# Patient Record
Sex: Female | Born: 1935 | Race: White | Hispanic: No | Marital: Married | Smoking: Former smoker
Health system: Southern US, Community
[De-identification: ages and names within clinical notes are randomized; demographics above are authoritative.]

## PROBLEM LIST (undated history)

## (undated) DIAGNOSIS — H9190 Unspecified hearing loss, unspecified ear: Secondary | ICD-10-CM

## (undated) DIAGNOSIS — F32A Depression, unspecified: Secondary | ICD-10-CM

## (undated) DIAGNOSIS — M549 Dorsalgia, unspecified: Secondary | ICD-10-CM

## (undated) DIAGNOSIS — R296 Repeated falls: Secondary | ICD-10-CM

## (undated) DIAGNOSIS — F329 Major depressive disorder, single episode, unspecified: Secondary | ICD-10-CM

## (undated) DIAGNOSIS — I639 Cerebral infarction, unspecified: Secondary | ICD-10-CM

## (undated) DIAGNOSIS — Z960 Presence of urogenital implants: Secondary | ICD-10-CM

## (undated) DIAGNOSIS — F419 Anxiety disorder, unspecified: Secondary | ICD-10-CM

## (undated) DIAGNOSIS — N2889 Other specified disorders of kidney and ureter: Secondary | ICD-10-CM

## (undated) DIAGNOSIS — C9110 Chronic lymphocytic leukemia of B-cell type not having achieved remission: Secondary | ICD-10-CM

## (undated) DIAGNOSIS — M4850XA Collapsed vertebra, not elsewhere classified, site unspecified, initial encounter for fracture: Secondary | ICD-10-CM

## (undated) DIAGNOSIS — Z8719 Personal history of other diseases of the digestive system: Secondary | ICD-10-CM

## (undated) DIAGNOSIS — E785 Hyperlipidemia, unspecified: Secondary | ICD-10-CM

## (undated) DIAGNOSIS — G8929 Other chronic pain: Secondary | ICD-10-CM

## (undated) DIAGNOSIS — K219 Gastro-esophageal reflux disease without esophagitis: Secondary | ICD-10-CM

## (undated) DIAGNOSIS — M810 Age-related osteoporosis without current pathological fracture: Secondary | ICD-10-CM

## (undated) DIAGNOSIS — G936 Cerebral edema: Secondary | ICD-10-CM

## (undated) DIAGNOSIS — I10 Essential (primary) hypertension: Secondary | ICD-10-CM

## (undated) DIAGNOSIS — F0390 Unspecified dementia without behavioral disturbance: Secondary | ICD-10-CM

## (undated) DIAGNOSIS — R413 Other amnesia: Secondary | ICD-10-CM

## (undated) DIAGNOSIS — N183 Chronic kidney disease, stage 3 unspecified: Secondary | ICD-10-CM

## (undated) DIAGNOSIS — S42309A Unspecified fracture of shaft of humerus, unspecified arm, initial encounter for closed fracture: Secondary | ICD-10-CM

## (undated) DIAGNOSIS — R410 Disorientation, unspecified: Secondary | ICD-10-CM

## (undated) DIAGNOSIS — I82409 Acute embolism and thrombosis of unspecified deep veins of unspecified lower extremity: Secondary | ICD-10-CM

## (undated) DIAGNOSIS — I619 Nontraumatic intracerebral hemorrhage, unspecified: Secondary | ICD-10-CM

## (undated) HISTORY — DX: Unspecified fracture of shaft of humerus, unspecified arm, initial encounter for closed fracture: S42.309A

## (undated) HISTORY — DX: Cerebral edema: G93.6

## (undated) HISTORY — DX: Depression, unspecified: F32.A

## (undated) HISTORY — PX: ABDOMINAL HYSTERECTOMY: SHX81

## (undated) HISTORY — DX: Presence of urogenital implants: Z96.0

## (undated) HISTORY — PX: FRACTURE SURGERY: SHX138

## (undated) HISTORY — DX: Cerebral infarction, unspecified: I63.9

## (undated) HISTORY — DX: Unspecified hearing loss, unspecified ear: H91.90

## (undated) HISTORY — DX: Disorientation, unspecified: R41.0

## (undated) HISTORY — DX: Repeated falls: R29.6

## (undated) HISTORY — DX: Major depressive disorder, single episode, unspecified: F32.9

## (undated) HISTORY — PX: CHOLECYSTECTOMY: SHX55

## (undated) HISTORY — DX: Other amnesia: R41.3

---

## 1978-09-08 HISTORY — PX: FOREARM FRACTURE SURGERY: SHX649

## 2000-02-29 ENCOUNTER — Other Ambulatory Visit: Admission: RE | Admit: 2000-02-29 | Discharge: 2000-02-29 | Payer: Self-pay | Admitting: Family Medicine

## 2004-01-08 HISTORY — PX: BLADDER SUSPENSION: SHX72

## 2004-11-20 ENCOUNTER — Inpatient Hospital Stay (HOSPITAL_COMMUNITY): Admission: RE | Admit: 2004-11-20 | Discharge: 2004-11-21 | Payer: Self-pay | Admitting: Obstetrics and Gynecology

## 2004-12-12 ENCOUNTER — Emergency Department (HOSPITAL_COMMUNITY): Admission: EM | Admit: 2004-12-12 | Discharge: 2004-12-12 | Payer: Self-pay | Admitting: Emergency Medicine

## 2005-04-16 ENCOUNTER — Other Ambulatory Visit: Admission: RE | Admit: 2005-04-16 | Discharge: 2005-04-16 | Payer: Self-pay | Admitting: Obstetrics and Gynecology

## 2005-10-14 ENCOUNTER — Encounter: Admission: RE | Admit: 2005-10-14 | Discharge: 2005-10-14 | Payer: Self-pay | Admitting: Family Medicine

## 2006-08-29 ENCOUNTER — Encounter: Admission: RE | Admit: 2006-08-29 | Discharge: 2006-08-29 | Payer: Self-pay | Admitting: Family Medicine

## 2006-09-22 ENCOUNTER — Encounter: Admission: RE | Admit: 2006-09-22 | Discharge: 2006-09-22 | Payer: Self-pay | Admitting: Family Medicine

## 2006-10-09 ENCOUNTER — Encounter: Admission: RE | Admit: 2006-10-09 | Discharge: 2006-10-09 | Payer: Self-pay | Admitting: Interventional Cardiology

## 2007-03-07 ENCOUNTER — Encounter: Admission: RE | Admit: 2007-03-07 | Discharge: 2007-03-07 | Payer: Self-pay | Admitting: Family Medicine

## 2009-01-07 DIAGNOSIS — I639 Cerebral infarction, unspecified: Secondary | ICD-10-CM

## 2009-01-07 DIAGNOSIS — I619 Nontraumatic intracerebral hemorrhage, unspecified: Secondary | ICD-10-CM

## 2009-01-07 HISTORY — DX: Cerebral infarction, unspecified: I63.9

## 2009-01-07 HISTORY — DX: Nontraumatic intracerebral hemorrhage, unspecified: I61.9

## 2009-04-24 ENCOUNTER — Encounter: Admission: RE | Admit: 2009-04-24 | Discharge: 2009-04-24 | Payer: Self-pay | Admitting: Family Medicine

## 2009-08-08 ENCOUNTER — Emergency Department (HOSPITAL_BASED_OUTPATIENT_CLINIC_OR_DEPARTMENT_OTHER): Admission: EM | Admit: 2009-08-08 | Discharge: 2009-08-08 | Payer: Self-pay | Admitting: Emergency Medicine

## 2009-08-08 ENCOUNTER — Ambulatory Visit: Payer: Self-pay | Admitting: Diagnostic Radiology

## 2009-08-11 ENCOUNTER — Emergency Department (HOSPITAL_BASED_OUTPATIENT_CLINIC_OR_DEPARTMENT_OTHER): Admission: EM | Admit: 2009-08-11 | Discharge: 2009-08-11 | Payer: Self-pay | Admitting: Emergency Medicine

## 2009-08-19 ENCOUNTER — Emergency Department (HOSPITAL_BASED_OUTPATIENT_CLINIC_OR_DEPARTMENT_OTHER): Admission: EM | Admit: 2009-08-19 | Discharge: 2009-08-20 | Payer: Self-pay | Admitting: Emergency Medicine

## 2010-03-29 ENCOUNTER — Emergency Department (HOSPITAL_BASED_OUTPATIENT_CLINIC_OR_DEPARTMENT_OTHER)
Admission: EM | Admit: 2010-03-29 | Discharge: 2010-03-29 | Disposition: A | Discharge status: Home / Self Care | Payer: Medicare Other | Attending: Emergency Medicine | Admitting: Emergency Medicine

## 2010-03-29 DIAGNOSIS — K219 Gastro-esophageal reflux disease without esophagitis: Secondary | ICD-10-CM | POA: Insufficient documentation

## 2010-03-29 DIAGNOSIS — Z8679 Personal history of other diseases of the circulatory system: Secondary | ICD-10-CM | POA: Insufficient documentation

## 2010-03-29 DIAGNOSIS — Z86718 Personal history of other venous thrombosis and embolism: Secondary | ICD-10-CM | POA: Insufficient documentation

## 2010-03-29 DIAGNOSIS — E7800 Pure hypercholesterolemia, unspecified: Secondary | ICD-10-CM | POA: Insufficient documentation

## 2010-03-29 DIAGNOSIS — K5289 Other specified noninfective gastroenteritis and colitis: Secondary | ICD-10-CM | POA: Insufficient documentation

## 2010-03-29 DIAGNOSIS — R197 Diarrhea, unspecified: Secondary | ICD-10-CM | POA: Insufficient documentation

## 2010-03-29 DIAGNOSIS — I10 Essential (primary) hypertension: Secondary | ICD-10-CM | POA: Insufficient documentation

## 2010-03-29 LAB — BASIC METABOLIC PANEL
CO2: 24 mEq/L (ref 19–32)
Calcium: 9.3 mg/dL (ref 8.4–10.5)
Creatinine, Ser: 0.8 mg/dL (ref 0.4–1.2)
GFR calc Af Amer: >60 mL/min (ref >60)
Glucose, Bld: 126 mg/dL — ABNORMAL HIGH (ref 70–99)

## 2010-03-29 LAB — CBC
HCT: 38.6 % (ref 36.0–46.0)
Hemoglobin: 13.7 g/dL (ref 12.0–15.0)
MCH: 33.1 pg (ref 26.0–34.0)
MCHC: 35.5 g/dL (ref 30.0–36.0)

## 2010-03-29 LAB — URINALYSIS, ROUTINE W REFLEX MICROSCOPIC
Bilirubin Urine: NEGATIVE
Hgb urine dipstick: NEGATIVE
Protein, ur: NEGATIVE mg/dL
Urobilinogen, UA: 0.2 mg/dL (ref 0.0–1.0)

## 2010-03-29 LAB — DIFFERENTIAL
Basophils Relative: 0 % (ref 0–1)
Eosinophils Relative: 0 % (ref 0–5)
Monocytes Absolute: 0.4 10*3/uL (ref 0.1–1.0)
Monocytes Relative: 3 % (ref 3–12)
Neutro Abs: 10.7 10*3/uL — ABNORMAL HIGH (ref 1.7–7.7)

## 2010-05-25 NOTE — H&P (Signed)
[name], [name]              ACCOUNT NO.:  [id number]   MEDICAL RECORD NO.:  [id number]          PATIENT TYPE:  INP   LOCATION:  9316                          FACILITY:  [facility]   PHYSICIAN:  Charles A. Delcambre, MDDATE OF BIRTH:  1933/08/23   DATE OF ADMISSION:  11/20/2004  DATE OF DISCHARGE:                                HISTORY & PHYSICAL   CHIEF COMPLAINT:  Vaginal bulge and pressure and pain and protrusion from  the vaginal introitus having to push something back in to urinate completely  and pelvic discomfort with diagnosis of a cystocele in the office.   HISTORY OF PRESENT ILLNESS:  A 77 year old para 3-0-0-3 patient of Dr.  Bjorn Pippin at [facility] with known cystocele and small rectocele now to be  admitted for repair of a cystocele with symptoms as noted above.   PAST MEDICAL HISTORY:  GERD.   PAST SURGICAL HISTORY:  1.  Vaginal hysterectomy.  Ovaries remain in situ.  2.  D&C prior to that.   MEDICATIONS:  1.  Prilosec one tablet once per day.  2.  Meclizine 25 mg p.o. t.i.d.   ALLERGIES:  PENICILLIN, reaction not specified.   SOCIAL HISTORY:  Denies tobacco, ethanol or drug use or STD exposure in the  past.  She is married, monogamous relationship with her husband.  Not  sexually active.  She quit smoking, but has a 20-pack-year smoking in the  past.   FAMILY HISTORY:  Father is deceased, not specified, of renal failure.  Mother deceased CVA, age not specified.  Four siblings.  Brother leg  amputated, not specified.  Brother single.  Otherwise, another brother is in  poor health.  One sister in fair health, not specified further.  Otherwise,  no major illnesses.   REVIEW OF SYSTEMS:  She has a cystocele with grayish thick discharge and  problems with back pain and discharge makes her vaginal bulge stick to her  underwear.  She denies fevers, chills, rashes, lesions, headaches,  dizziness.  Some seasonal allergies are present occasionally, not present.  Currently, no chest pain, shortness of breath, wheezing, diarrhea,  constipation, bleeding, melena, or hematochezia, urgency, frequency,  dysuria, incontinence, hematuria, galactorrhea, or emotional changes.   PHYSICAL EXAMINATION:  GENERAL:  Alert and oriented x3.  No distress.  VITAL SIGNS:  As noted in the admission chart.  HEENT:  Grossly within normal limits.  NECK:  Supple without thyromegaly or adenopathy.  LUNGS:  Clear bilaterally.  HEART:  Regular rate and rhythm without murmurs, rubs, or gallops.  BREASTS:  Symmetrical, deferred.  ABDOMEN:  Soft, flat, and nontender.  No hepatosplenomegaly.  No other  masses noted.  PELVIC:  Normal external female genetalia intact.  Bartholin's, urethral,  Skene's within normal limits.  Vault without discharge currently, but a  large protruding mass consistent with prolapsing cystocele as noted.  Very  minimal rectocele is noted.  Anal sphincter tone is good.  No evidence of  enterocele on examination is noted.  EXTREMITIES:  No significant edema.  The extremities are nontender.   ASSESSMENT:  Cystocele, small rectocele.   PLAN:  Admission for repair today.  The patient gives informed consent,  accepts risks of infection, bleeding, bowel and bladder damage, ureteral  damage, blood product risks including hepatitis and HIV exposure.  All  questions are answered.  She understands risks of urinary retention  postoperatively.  Will proceed with surgery as noted.  If rectocele is not  significant during surgery further, I will not proceed with rectocele repair  to not over proceed with reducing the vagina length or capacity in case she  wants to become sexually active once again.  She has been informed of such  and will proceed as outlined.  Laboratories are obtained.  Hemoglobin 13.6,  hematocrit 39.5, and BMP electrolytes 137 sodium, potassium 3.9, chloride  101, CO2 28, glucose 95, BUN 8, creatinine 0.8, calcium 9.1.      Charles A.  Sydnee Cabal, MD  Electronically Signed     CAD/MEDQ  D:  11/20/2004  T:  11/20/2004  Job:  [id number]

## 2010-05-25 NOTE — Op Note (Signed)
[name], [name]              ACCOUNT NO.:  [id number]   MEDICAL RECORD NO.:  [id number]          PATIENT TYPE:  INP   LOCATION:  9316                          [facility]ACILITY:  [facility]   PHYSICIAN:  Charles A. Delcambre, MDDATE OF BIRTH:  [birth year]   DATE OF PROCEDURE:  11/20/2004  DATE OF DISCHARGE:                                 OPERATIVE REPORT   PREOPERATIVE DIAGNOSES:  1.  Cystocele.  2.  Rectocele.   POSTOPERATIVE DIAGNOSES:  1.  Cystocele.  2.  Very small rectocele, asymptomatic, at my recommendation, requiring no      repair at the time to limit further decrease of the vagina after large      anterior repair.   OPERATION/PROCEDURE:  Anterior repair.   SURGEON:  Charles A. Sydnee Cabal, M.D.   ASSISTANT:  Artist Pais, M.D.   COMPLICATIONS:  None.   ESTIMATED BLOOD LOSS:  Less than 50 mL.   ANESTHESIA:  General by endotracheal route.   SPECIMENS:  None.   COUNTS:  Sponge and needle counts correct x2.   OPERATIVE FINDINGS:  Large cystocele, very minimal rectocele as noted above.   DESCRIPTION OF PROCEDURE:  The patient was taken to the operating room and  placed in the supine position.  General anesthesia was induced without  difficulty.  Sterile prep and drape was undertaken and dorsal lithotomy  position.  Vaginal cuff was grasped with Allis clamps and 1% lidocaine with  1:200,000 epinephrine was injected under the vaginal mucosa, a total of 9 mL  to help in development of the plane of dissection.  Vaginal mucosa was  developed undermining off the bladder and opened, swinging the vaginal  mucosa off the cystocele laterally.  Infundibulopelvic fascia was then  plicated with 2-0 Vicryl with six sutures across, plicating this well,  giving good support of the cystocele, reducing it well.  This yielded  adequate support of the cystocele.  Excessive material of the vaginal mucosa  was excised.  The vaginal mucosa was then closed with 2-0 Vicryl running  locking suture  with good hemostasis.  Vaginal pack with one-inch gauze with  Estrace cream was placed to be left overnight.  Foley catheter  was placed.  The patient tolerated the procedure well and was taken to  recovery after further examination yielded physical findings for rectocele  as noted above.  She was extubated and taken to recovery with the physician  in attendance having tolerated the procedure well.      Charles A. Sydnee Cabal, MD  Electronically Signed     CAD/MEDQ  D:  11/20/2004  T:  11/20/2004  Job:  [id number]

## 2011-07-31 ENCOUNTER — Emergency Department (HOSPITAL_BASED_OUTPATIENT_CLINIC_OR_DEPARTMENT_OTHER)
Admission: EM | Admit: 2011-07-31 | Discharge: 2011-07-31 | Disposition: A | Discharge status: Home / Self Care | Payer: Medicare Other | Attending: Emergency Medicine | Admitting: Emergency Medicine

## 2011-07-31 ENCOUNTER — Encounter (HOSPITAL_BASED_OUTPATIENT_CLINIC_OR_DEPARTMENT_OTHER): Payer: Self-pay | Admitting: *Deleted

## 2011-07-31 DIAGNOSIS — B029 Zoster without complications: Secondary | ICD-10-CM | POA: Insufficient documentation

## 2011-07-31 DIAGNOSIS — Z87891 Personal history of nicotine dependence: Secondary | ICD-10-CM | POA: Insufficient documentation

## 2011-07-31 DIAGNOSIS — Z7982 Long term (current) use of aspirin: Secondary | ICD-10-CM | POA: Insufficient documentation

## 2011-07-31 DIAGNOSIS — Z9089 Acquired absence of other organs: Secondary | ICD-10-CM | POA: Insufficient documentation

## 2011-07-31 DIAGNOSIS — E785 Hyperlipidemia, unspecified: Secondary | ICD-10-CM | POA: Insufficient documentation

## 2011-07-31 DIAGNOSIS — Z91040 Latex allergy status: Secondary | ICD-10-CM | POA: Insufficient documentation

## 2011-07-31 DIAGNOSIS — Z90710 Acquired absence of both cervix and uterus: Secondary | ICD-10-CM | POA: Insufficient documentation

## 2011-07-31 DIAGNOSIS — I10 Essential (primary) hypertension: Secondary | ICD-10-CM | POA: Insufficient documentation

## 2011-07-31 DIAGNOSIS — Z880 Allergy status to penicillin: Secondary | ICD-10-CM | POA: Insufficient documentation

## 2011-07-31 DIAGNOSIS — Z8673 Personal history of transient ischemic attack (TIA), and cerebral infarction without residual deficits: Secondary | ICD-10-CM | POA: Insufficient documentation

## 2011-07-31 HISTORY — DX: Other specified disorders of kidney and ureter: N28.89

## 2011-07-31 HISTORY — DX: Essential (primary) hypertension: I10

## 2011-07-31 HISTORY — DX: Unspecified dementia, unspecified severity, without behavioral disturbance, psychotic disturbance, mood disturbance, and anxiety: F03.90

## 2011-07-31 HISTORY — DX: Nontraumatic intracerebral hemorrhage, unspecified: I61.9

## 2011-07-31 HISTORY — DX: Hyperlipidemia, unspecified: E78.5

## 2011-07-31 LAB — URINALYSIS, ROUTINE W REFLEX MICROSCOPIC
Bilirubin Urine: NEGATIVE
Glucose, UA: NEGATIVE mg/dL
Hgb urine dipstick: NEGATIVE
Ketones, ur: NEGATIVE mg/dL
Protein, ur: NEGATIVE mg/dL

## 2011-07-31 MED ORDER — VALACYCLOVIR HCL 1 G PO TABS: 1000.0000 mg | ORAL_TABLET | Freq: Three times a day (TID) | ORAL | Status: AC

## 2011-07-31 MED ORDER — GABAPENTIN 100 MG PO CAPS: 100.0000 mg | ORAL_CAPSULE | Freq: Three times a day (TID) | ORAL | Status: DC

## 2011-07-31 MED ORDER — LIDOCAINE 5 % EX OINT: TOPICAL_OINTMENT | CUTANEOUS | Status: AC | PRN

## 2011-07-31 NOTE — ED Notes (Signed)
Pt c/o lower abd pain and central chest pain which radiates to back x 2 days off and on

## 2011-07-31 NOTE — ED Provider Notes (Signed)
I saw and evaluated the patient, reviewed the resident's note and I agree with the findings and plan.   .Face to face Exam:  General:  Awake HEENT:  Atraumatic Resp:  Normal effort Abd:  Nondistended Neuro:No focal weakness Lymph: No adenopathy   [name], MD 07/31/11 2014

## 2011-07-31 NOTE — ED Provider Notes (Addendum)
History     CSN: [id number]  Arrival date & time 07/31/11  1820   First MD Initiated Contact with Patient 07/31/11 1840      Chief Complaint  Patient presents with  . Abdominal Pain  . Chest Pain    (Consider location/radiation/quality/duration/timing/severity/associated sxs/prior treatment) HPI  78 -year-old female who presents with abdominal and back pain of 2 days duration. She says the pain changes location but is primarily on her left flank and left lower back. Is accompanied by nausea but no vomiting. She denies diarrhea and constipation. She denies chest pain shortness of breath. She history of coronary heart disease or MI. The pain is exacerbated by lying down on her back. It is alleviated by nothing.  Past Medical History  Diagnosis Date  . Hypertension   . Hyperlipemia   . Dementia   . Cerebral hemorrhage, nontraumatic 2011  . CVA (cerebral infarction) 2011    left cerebellum  . Ureterocele, acquired     Past Surgical History  Procedure Date  . Abdominal hysterectomy   . Cholecystectomy     History reviewed. No pertinent family history.  History  Substance Use Topics  . Smoking status: Former Games developer  . Smokeless tobacco: Not on file  . Alcohol Use: No    OB History    Grav Para Term Preterm Abortions TAB SAB Ect Mult Living                  Review of Systems Positive for rash on her back. All the systems negative   Allergies  Penicillins and Latex  Home Medications   Current Outpatient Rx  Name Route Sig Dispense Refill  . AMLODIPINE BESYLATE 5 MG PO TABS Oral Take 5 mg by mouth every morning.    . ASPIRIN EC 81 MG PO TBEC Oral Take 81 mg by mouth every morning.    . ATORVASTATIN CALCIUM 80 MG PO TABS Oral Take 80 mg by mouth every morning.    Marland Kitchen VITAMIN D 1000 UNITS PO TABS Oral Take 1,000 Units by mouth 2 (two) times daily.    . IBUPROFEN 200 MG PO TABS Oral Take 200 mg by mouth every 6 (six) hours as needed. For pain    . LOSARTAN  POTASSIUM 100 MG PO TABS Oral Take 100 mg by mouth every morning.    Marland Kitchen BIOFREEZE EX Apply externally Apply 1 application topically 2 (two) times daily as needed. For arthritis pain in neck    . ADULT MULTIVITAMIN W/MINERALS CH Oral Take 1 tablet by mouth daily.    Marland Kitchen OMEPRAZOLE 40 MG PO CPDR Oral Take 40 mg by mouth every morning.    . TRAZODONE HCL 50 MG PO TABS Oral Take 50 mg by mouth at bedtime.    Marland Kitchen VITAMIN B-12 1000 MCG PO TABS Oral Take 1,000 mcg by mouth daily.      BP 156/69  Pulse 68  Temp 98 F (36.7 C) (Oral)  Resp 16  Ht 5\' 4"  (1.626 m)  Wt 134 lb (60.782 kg)  BMI 23.00 kg/m2  SpO2 100%  Physical Exam  Constitutional: She is oriented to person, place, and time. She appears well-developed and well-nourished. No distress.  HENT:  Head: Normocephalic and atraumatic.  Mouth/Throat: No oropharyngeal exudate.  Eyes: Conjunctivae and EOM are normal. Pupils are equal, round, and reactive to light.  Neck: Normal range of motion.  Cardiovascular: Normal rate and regular rhythm.   Pulmonary/Chest: Effort normal and breath sounds normal.  Abdominal: Soft. Bowel sounds are normal.  Neurological: She is alert and oriented to person, place, and time.  Skin: Rash noted. Rash is vesicular. She is not diaphoretic.       ED Course  Procedures (including critical care time)   Date: 07/31/2011  Rate: 70  Rhythm: normal sinus rhythm  QRS Axis: normal  Intervals: normal  ST/T Wave abnormalities: normal  Conduction Disutrbances:none  Narrative Interpretation: normal ECG  Old EKG Reviewed: none available     Labs Reviewed  URINALYSIS, ROUTINE W REFLEX MICROSCOPIC   No results found.   1. Shingles       MDM  46 from female with left flank and back pain that is result of shingles outbreak. Fortunately she was within 72 hours of the presentation of the rash. Therefore she was treated with Valacyclovir 1 g 3 times a day. For pain she was prescribed gabapentin and  lidocaine gel. She was stable and appropriate for discharge. She was provided education about shingles with this plan, and she will need follow up with her PCP in one week. She is in agreement        Garnetta Buddy, MD 07/31/11 2007  Garnetta Buddy, MD 07/31/11 2217

## 2011-08-01 NOTE — ED Provider Notes (Signed)
I saw and evaluated the patient, reviewed the resident's note and I agree with the findings and plan.   .Face to face Exam:  General:  Awake HEENT:  Atraumatic Resp:  Normal effort Abd:  Nondistended Neuro:No focal weakness Lymph: No adenopathy   [name], MD 08/01/11 1113

## 2013-09-02 ENCOUNTER — Encounter: Payer: Self-pay | Admitting: Interventional Cardiology

## 2013-09-02 DIAGNOSIS — E785 Hyperlipidemia, unspecified: Secondary | ICD-10-CM | POA: Insufficient documentation

## 2013-09-02 DIAGNOSIS — Z8673 Personal history of transient ischemic attack (TIA), and cerebral infarction without residual deficits: Secondary | ICD-10-CM | POA: Insufficient documentation

## 2013-09-02 DIAGNOSIS — N2889 Other specified disorders of kidney and ureter: Secondary | ICD-10-CM | POA: Insufficient documentation

## 2013-09-02 DIAGNOSIS — F0390 Unspecified dementia without behavioral disturbance: Secondary | ICD-10-CM | POA: Insufficient documentation

## 2013-09-02 DIAGNOSIS — I619 Nontraumatic intracerebral hemorrhage, unspecified: Secondary | ICD-10-CM | POA: Insufficient documentation

## 2014-01-07 DIAGNOSIS — G936 Cerebral edema: Secondary | ICD-10-CM

## 2014-01-07 HISTORY — DX: Cerebral edema: G93.6

## 2014-01-16 ENCOUNTER — Emergency Department (HOSPITAL_BASED_OUTPATIENT_CLINIC_OR_DEPARTMENT_OTHER): Payer: Medicare Other

## 2014-01-16 ENCOUNTER — Encounter (HOSPITAL_BASED_OUTPATIENT_CLINIC_OR_DEPARTMENT_OTHER): Payer: Self-pay | Admitting: *Deleted

## 2014-01-16 ENCOUNTER — Emergency Department (HOSPITAL_BASED_OUTPATIENT_CLINIC_OR_DEPARTMENT_OTHER)
Admission: EM | Admit: 2014-01-16 | Discharge: 2014-01-16 | Disposition: A | Discharge status: Home / Self Care | Payer: Medicare Other | Attending: Emergency Medicine | Admitting: Emergency Medicine

## 2014-01-16 DIAGNOSIS — Z7982 Long term (current) use of aspirin: Secondary | ICD-10-CM | POA: Insufficient documentation

## 2014-01-16 DIAGNOSIS — F0390 Unspecified dementia without behavioral disturbance: Secondary | ICD-10-CM | POA: Insufficient documentation

## 2014-01-16 DIAGNOSIS — Y9389 Activity, other specified: Secondary | ICD-10-CM | POA: Insufficient documentation

## 2014-01-16 DIAGNOSIS — Z8673 Personal history of transient ischemic attack (TIA), and cerebral infarction without residual deficits: Secondary | ICD-10-CM | POA: Diagnosis not present

## 2014-01-16 DIAGNOSIS — I10 Essential (primary) hypertension: Secondary | ICD-10-CM | POA: Diagnosis not present

## 2014-01-16 DIAGNOSIS — S22060A Wedge compression fracture of T7-T8 vertebra, initial encounter for closed fracture: Secondary | ICD-10-CM | POA: Diagnosis not present

## 2014-01-16 DIAGNOSIS — Z880 Allergy status to penicillin: Secondary | ICD-10-CM | POA: Diagnosis not present

## 2014-01-16 DIAGNOSIS — Y9289 Other specified places as the place of occurrence of the external cause: Secondary | ICD-10-CM | POA: Insufficient documentation

## 2014-01-16 DIAGNOSIS — Z87891 Personal history of nicotine dependence: Secondary | ICD-10-CM | POA: Diagnosis not present

## 2014-01-16 DIAGNOSIS — Z91040 Latex allergy status: Secondary | ICD-10-CM | POA: Insufficient documentation

## 2014-01-16 DIAGNOSIS — S3992XA Unspecified injury of lower back, initial encounter: Secondary | ICD-10-CM | POA: Diagnosis present

## 2014-01-16 DIAGNOSIS — R51 Headache: Secondary | ICD-10-CM | POA: Diagnosis not present

## 2014-01-16 DIAGNOSIS — Y998 Other external cause status: Secondary | ICD-10-CM | POA: Diagnosis not present

## 2014-01-16 DIAGNOSIS — X58XXXA Exposure to other specified factors, initial encounter: Secondary | ICD-10-CM | POA: Diagnosis not present

## 2014-01-16 DIAGNOSIS — Z79899 Other long term (current) drug therapy: Secondary | ICD-10-CM | POA: Diagnosis not present

## 2014-01-16 DIAGNOSIS — S22000A Wedge compression fracture of unspecified thoracic vertebra, initial encounter for closed fracture: Secondary | ICD-10-CM

## 2014-01-16 LAB — CBC WITH DIFFERENTIAL/PLATELET
BASOS ABS: 0 10*3/uL (ref 0.0–0.1)
Basophils Relative: 0 % (ref 0–1)
Eosinophils Absolute: 0 10*3/uL (ref 0.0–0.7)
Eosinophils Relative: 1 % (ref 0–5)
HCT: 34.7 % — ABNORMAL LOW (ref 36.0–46.0)
Hemoglobin: 12.1 g/dL (ref 12.0–15.0)
LYMPHS ABS: 1 10*3/uL (ref 0.7–4.0)
LYMPHS PCT: 15 % (ref 12–46)
MCH: 32.9 pg (ref 26.0–34.0)
MCHC: 34.9 g/dL (ref 30.0–36.0)
MCV: 94.3 fL (ref 78.0–100.0)
MONOS PCT: 10 % (ref 3–12)
Monocytes Absolute: 0.6 10*3/uL (ref 0.1–1.0)
NEUTROS ABS: 4.7 10*3/uL (ref 1.7–7.7)
NEUTROS PCT: 74 % (ref 43–77)
Platelets: 281 10*3/uL (ref 150–400)
RBC: 3.68 MIL/uL — ABNORMAL LOW (ref 3.87–5.11)
RDW: 11.4 % — ABNORMAL LOW (ref 11.5–15.5)
WBC: 6.3 10*3/uL (ref 4.0–10.5)

## 2014-01-16 LAB — COMPREHENSIVE METABOLIC PANEL
ALBUMIN: 4.2 g/dL (ref 3.5–5.2)
ALK PHOS: 95 U/L (ref 39–117)
ALT: 21 U/L (ref 0–35)
ANION GAP: 8 (ref 5–15)
AST: 24 U/L (ref 0–37)
BILIRUBIN TOTAL: 0.7 mg/dL (ref 0.3–1.2)
BUN: 13 mg/dL (ref 6–23)
CO2: 26 mmol/L (ref 19–32)
Calcium: 9.6 mg/dL (ref 8.4–10.5)
Chloride: 96 mEq/L (ref 96–112)
Creatinine, Ser: 1.05 mg/dL (ref 0.50–1.10)
GFR, EST AFRICAN AMERICAN: 57 mL/min — AB (ref >90)
GFR, EST NON AFRICAN AMERICAN: 50 mL/min — AB (ref >90)
Glucose, Bld: 108 mg/dL — ABNORMAL HIGH (ref 70–99)
Potassium: 4.2 mmol/L (ref 3.5–5.1)
Sodium: 130 mmol/L — ABNORMAL LOW (ref 135–145)
Total Protein: 7 g/dL (ref 6.0–8.3)

## 2014-01-16 LAB — URINALYSIS, ROUTINE W REFLEX MICROSCOPIC
BILIRUBIN URINE: NEGATIVE
GLUCOSE, UA: NEGATIVE mg/dL
Hgb urine dipstick: NEGATIVE
Ketones, ur: NEGATIVE mg/dL
Leukocytes, UA: NEGATIVE
NITRITE: NEGATIVE
PH: 7.5 (ref 5.0–8.0)
PROTEIN: NEGATIVE mg/dL
Specific Gravity, Urine: 1.013 (ref 1.005–1.030)
UROBILINOGEN UA: 0.2 mg/dL (ref 0.0–1.0)

## 2014-01-16 LAB — PROTIME-INR
INR: 0.98 (ref 0.00–1.49)
Prothrombin Time: 13 seconds (ref 11.6–15.2)

## 2014-01-16 LAB — LIPASE, BLOOD: Lipase: 61 U/L — ABNORMAL HIGH (ref 11–59)

## 2014-01-16 MED ORDER — IOHEXOL 350 MG/ML SOLN
100.0000 mL | Freq: Once | INTRAVENOUS | Status: AC | PRN
Start: 2014-01-16 — End: 2014-01-16
  Administered 2014-01-16: 100 mL via INTRAVENOUS

## 2014-01-16 MED ORDER — MORPHINE SULFATE 2 MG/ML IJ SOLN
2.0000 mg | Freq: Once | INTRAMUSCULAR | Status: AC
  Administered 2014-01-16: 2 mg via INTRAVENOUS
  Filled 2014-01-16: qty 1

## 2014-01-16 MED ORDER — OXYCODONE-ACETAMINOPHEN 5-325 MG PO TABS
2.0000 | ORAL_TABLET | Freq: Once | ORAL | Status: AC
  Administered 2014-01-16: 2 via ORAL
  Filled 2014-01-16: qty 2

## 2014-01-16 MED ORDER — OXYCODONE-ACETAMINOPHEN 5-325 MG PO TABS: 1.0000 | ORAL_TABLET | ORAL | Status: DC | PRN

## 2014-01-16 MED ORDER — LORAZEPAM 2 MG/ML IJ SOLN
0.5000 mg | Freq: Once | INTRAMUSCULAR | Status: AC
  Administered 2014-01-16: 0.5 mg via INTRAVENOUS
  Filled 2014-01-16: qty 1

## 2014-01-16 MED ORDER — MORPHINE SULFATE 2 MG/ML IJ SOLN
2.0000 mg | Freq: Once | INTRAMUSCULAR | Status: AC
  Administered 2014-01-16: 2 mg via INTRAVENOUS

## 2014-01-16 MED ORDER — SODIUM CHLORIDE 0.9 % IV BOLUS (SEPSIS)
500.0000 mL | Freq: Once | INTRAVENOUS | Status: AC
  Administered 2014-01-16: 500 mL via INTRAVENOUS

## 2014-01-16 MED ORDER — MORPHINE SULFATE 2 MG/ML IJ SOLN
INTRAMUSCULAR | Status: AC
  Filled 2014-01-16: qty 1

## 2014-01-16 MED ORDER — ONDANSETRON HCL 4 MG/2ML IJ SOLN
4.0000 mg | Freq: Once | INTRAMUSCULAR | Status: AC
  Administered 2014-01-16: 4 mg via INTRAVENOUS
  Filled 2014-01-16: qty 2

## 2014-01-16 NOTE — ED Provider Notes (Signed)
CSN: [id number]     Arrival date & time 01/16/14  0809 History   First MD Initiated Contact with Patient 01/16/14 0827     Chief Complaint  Patient presents with  . Back Pain     (Consider location/radiation/quality/duration/timing/severity/associated sxs/prior Treatment) HPI 81 year old female with history of CVA, hypertension, hyperlipidemia who presents today complaining of neck pain for 2 months. She states that her low back began hurting in November when she been doing yard work. She had an acute change on New Year's Day when she began having much more severe pain throughout her entire back that occurred with any movement. She has not had any loss of strength but has had decreased ability to ambulate secondary to pain with any movement. She has had some episodic nausea and vomiting but in general has been eating and taking without difficulty. She has had some decreased stooling. Daughter has intact given her laxatives twice during the past week she has had relatively small stool output from this. She describes some frequency of urination but no urinary retention. She has not noted any lateralized weakness, extremity weakness, problems with urinary retention, or numbness during these episodes. They she states that her back pain is 10 out of 10. She describes it as occurring from her upper back all the way down to the lower back area. She has had no previous surgeries, no spinal manipulation, no sections in this area. She states that she has fallen several times in the past several months but had not noted that these caused back pain. None of these falls have been in the past week. She is here today with her daughter. She lives with her husband. Past Medical History  Diagnosis Date  . Hypertension   . Hyperlipemia   . Dementia   . Cerebral hemorrhage, nontraumatic 2011  . CVA (cerebral infarction) 2011    left cerebellum  . Ureterocele, acquired    Past Surgical History  Procedure  Laterality Date  . Abdominal hysterectomy    . Cholecystectomy     No family history on file. History  Substance Use Topics  . Smoking status: Former Research scientist (life sciences)  . Smokeless tobacco: Not on file  . Alcohol Use: No   OB History    No data available     Review of Systems  All other systems reviewed and are negative.     Allergies  Penicillins and Latex  Home Medications   Prior to Admission medications   Medication Sig Start Date End Date Taking? Authorizing Provider  amLODipine (NORVASC) 5 MG tablet Take 5 mg by mouth every morning.    Historical Provider, MD  aspirin EC 81 MG tablet Take 81 mg by mouth every morning.    Historical Provider, MD  atorvastatin (LIPITOR) 80 MG tablet Take 80 mg by mouth every morning.    Historical Provider, MD  cholecalciferol (VITAMIN D) 1000 UNITS tablet Take 1,000 Units by mouth 2 (two) times daily.    Historical Provider, MD  gabapentin (NEURONTIN) 100 MG capsule Take 1 capsule (100 mg total) by mouth 3 (three) times daily. 07/31/11 07/30/12  Angelica Ran, MD  ibuprofen (ADVIL,MOTRIN) 200 MG tablet Take 200 mg by mouth every 6 (six) hours as needed. For pain    Historical Provider, MD  losartan (COZAAR) 100 MG tablet Take 100 mg by mouth every morning.    Historical Provider, MD  Menthol, Topical Analgesic, (BIOFREEZE EX) Apply 1 application topically 2 (two) times daily as needed. For arthritis pain  in neck    Historical Provider, MD  Multiple Vitamin (MULTIVITAMIN WITH MINERALS) TABS Take 1 tablet by mouth daily.    Historical Provider, MD  omeprazole (PRILOSEC) 40 MG capsule Take 40 mg by mouth every morning.    Historical Provider, MD  traZODone (DESYREL) 50 MG tablet Take 50 mg by mouth at bedtime.    Historical Provider, MD  vitamin B-12 (CYANOCOBALAMIN) 1000 MCG tablet Take 1,000 mcg by mouth daily.    Historical Provider, MD   BP 129/67 mmHg  Pulse 89  Temp(Src) 98.3 F (36.8 C) (Oral)  Resp 20  Ht 5\' 4"  (1.626 m)  Wt 135 lb  (61.236 kg)  BMI 23.16 kg/m2  SpO2 100% Physical Exam  Constitutional: She is oriented to person, place, and time. She appears well-developed and well-nourished.  HENT:  Head: Normocephalic and atraumatic.  Right Ear: Tympanic membrane and external ear normal.  Left Ear: Tympanic membrane and external ear normal.  Nose: Nose normal. Right sinus exhibits no maxillary sinus tenderness and no frontal sinus tenderness. Left sinus exhibits no maxillary sinus tenderness and no frontal sinus tenderness.  Eyes: Conjunctivae and EOM are normal. Pupils are equal, round, and reactive to light. Right eye exhibits no nystagmus. Left eye exhibits no nystagmus.  Neck: Normal range of motion. Neck supple.  Cardiovascular: Normal rate, regular rhythm, normal heart sounds and intact distal pulses.   Pulmonary/Chest: Effort normal and breath sounds normal. No respiratory distress. She exhibits no tenderness.  Abdominal: Soft. Bowel sounds are normal. She exhibits no distension and no mass. There is tenderness.    Musculoskeletal: Normal range of motion. She exhibits no edema or tenderness.  Neurological: She is alert and oriented to person, place, and time. She has normal strength and normal reflexes. No sensory deficit. She displays a negative Romberg sign. Coordination normal. GCS eye subscore is 4. GCS verbal subscore is 5. GCS motor subscore is 6.  Reflex Scores:      Tricep reflexes are 2+ on the right side and 2+ on the left side.      Bicep reflexes are 2+ on the right side and 2+ on the left side.      Brachioradialis reflexes are 2+ on the right side and 2+ on the left side.      Patellar reflexes are 2+ on the right side and 2+ on the left side.      Achilles reflexes are 2+ on the right side and 2+ on the left side. Speech is normal without dysarthria, dysphasia, or aphasia. Muscle strength is 5/5 in bilateral shoulders, elbow flexor and extensors, wrist flexor and extensors, and intrinsic hand  muscles. 5/5 bilateral lower extremity hip flexors, extensors, knee flexors and extensors, and ankle dorsi and plantar flexors.  No sensory deficit is noted. No saddle anesthesia is noted.  Skin: Skin is warm and dry. No rash noted.  Psychiatric: She has a normal mood and affect. Her behavior is normal. Judgment and thought content normal.  Nursing note and vitals reviewed.   ED Course  Procedures (including critical care time) Labs Review Labs Reviewed  URINALYSIS, ROUTINE W REFLEX MICROSCOPIC  CBC WITH DIFFERENTIAL  PROTIME-INR  COMPREHENSIVE METABOLIC PANEL  LIPASE, BLOOD    Imaging Review No results found.   EKG Interpretation   Date/Time:  Sunday January 16 2014 08:53:36 EST Ventricular Rate:  79 PR Interval:  150 QRS Duration: 76 QT Interval:  384 QTC Calculation: 440 R Axis:   0 Text Interpretation:  Normal  sinus rhythm Normal ECG Confirmed by Noelia Lenart MD,  Andee Poles [phone]) on 01/16/2014 9:14:16 AM      MDM   Final diagnoses:  Thoracic compression fracture, closed, initial encounter    Extensive discussion with family. Patient and family which patient to be treated at home. She has had these symptoms that started January 1 and has been coping with pain at home without pain medication. She has no Neurological deficits. CT here revealed a T7 fracture. I have discussed with the family that given she is requiring narcotic pain medicine, she should have someone else in the home. 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 They voice that they will be able to monitor her on the pain medicine and help with her husband. I have discussed with them that they need to follow-up with Dr. freed and with Dr. Barbaraann Barthel. She is referred to Dr. Barbaraann Barthel for further management and referral for outpatient services.    Shaune Pollack, MD 01/16/14 [phone]

## 2014-01-16 NOTE — Discharge Instructions (Signed)
Please call Dr. Ericka Pontiff office in the morning for follow-up this week. Also please follow up with Dr. Maceo Pro  Vertebral Fracture A vertebral fracture is when one or more bones in the spine are broken (fractured). These bones are called vertebra. You may have pain and be stiff for 3 to 6 weeks.  HOME CARE   Rest in bed for a few days.  Take pain medicine as told by your doctor.  Slowly return to activity as told by your doctor.  Ask your doctor if any physical therapy is needed. GET HELP RIGHT AWAY IF:   Your pain gets worse.  You throw up (vomit).  You cannot move around at all.  You have numbness, tingling, weakness, or cannot move any part of your body.  You cannot control your poop (bowel movements) or pee (urination).  You have chest or belly (abdominal) pain, coughing, or trouble breathing.  You have a temperature by mouth above 102 F (38.9 C), not controlled by medicine. MAKE SURE YOU:   Understand these instructions.  Will watch your condition.  Will get help right away if you are not doing well or get worse. Document Released: 06/13/2009 Document Revised: 10/14/2012 Document Reviewed: 06/13/2009 [facility] Patient Information 2015 [place], [place]. This information is not intended to replace advice given to you by your health care provider. Make sure you discuss any questions you have with your health care provider.

## 2014-01-16 NOTE — ED Notes (Signed)
Patient has been been experiencing back pain since Thanksgiving. Takes 81 mg aspiring daily but unable to take additional meds except tylenol due to risk of brain bleed.

## 2014-01-17 ENCOUNTER — Encounter: Payer: Self-pay | Admitting: Family Medicine

## 2014-01-17 ENCOUNTER — Ambulatory Visit (INDEPENDENT_AMBULATORY_CARE_PROVIDER_SITE_OTHER): Payer: Medicare Other | Admitting: Family Medicine

## 2014-01-17 VITALS — BP 130/74 | HR 80 | Ht 64.0 in | Wt 135.0 lb

## 2014-01-17 DIAGNOSIS — M4854XA Collapsed vertebra, not elsewhere classified, thoracic region, initial encounter for fracture: Secondary | ICD-10-CM

## 2014-01-17 MED ORDER — OXYCODONE-ACETAMINOPHEN 5-325 MG PO TABS: 1.0000 | ORAL_TABLET | Freq: Four times a day (QID) | ORAL | Status: DC | PRN

## 2014-01-17 NOTE — Patient Instructions (Signed)
You have a thoracic spine compression fracture. These usually heal over 6-8 weeks though pain can persist for months at a lower level. Take oxycodone as needed for severe pain - be careful as this can make you sleepy and cause constipation. Icing or heat 15 minutes at a time (whichever feels better). Continue with calcium and vitamin D as you have been. Contact your family physician for treatment for osteoporosis.  If they do not treat this, call me and I will get you set up with an endocrinologist. Call me if you want to try that nasal spray for additional pain control though usually this is expensive. Follow up with me in 4 weeks.

## 2014-01-19 DIAGNOSIS — M4854XA Collapsed vertebra, not elsewhere classified, thoracic region, initial encounter for fracture: Secondary | ICD-10-CM | POA: Insufficient documentation

## 2014-01-19 NOTE — Progress Notes (Signed)
PCP: Abigail Miyamoto, MD  Subjective:   HPI: Patient is a 81 y.o. female here for back pain.  Patient reports she's been having mid back pain since 01/07/14. No new injury - reports she has fallen in the past but last time was 1-2 years ago. In ED had CT scans which showed she has an acute T7 compression fracture with about 50% height loss.  She also has more chronic changes at L3 and L5 that could represent remote compression fractures. She has been taking oxycodone. Takes tums and vitamin D. Been told in past she has osteoporosis though never been on medication for this.  Past Medical History  Diagnosis Date  . Hypertension   . Hyperlipemia   . Dementia   . Cerebral hemorrhage, nontraumatic 2011  . CVA (cerebral infarction) 2011    left cerebellum  . Ureterocele, acquired     Current Outpatient Prescriptions on File Prior to Visit  Medication Sig Dispense Refill  . aspirin EC 81 MG tablet Take 81 mg by mouth every morning.    Marland Kitchen atorvastatin (LIPITOR) 80 MG tablet Take 80 mg by mouth every morning.    Marland Kitchen omeprazole (PRILOSEC) 40 MG capsule Take 40 mg by mouth every morning.    . cholecalciferol (VITAMIN D) 1000 UNITS tablet Take 1,000 Units by mouth 2 (two) times daily.    Marland Kitchen ibuprofen (ADVIL,MOTRIN) 200 MG tablet Take 200 mg by mouth every 6 (six) hours as needed. For pain    . Menthol, Topical Analgesic, (BIOFREEZE EX) Apply 1 application topically 2 (two) times daily as needed. For arthritis pain in neck    . Multiple Vitamin (MULTIVITAMIN WITH MINERALS) TABS Take 1 tablet by mouth daily.    . traZODone (DESYREL) 50 MG tablet Take 50 mg by mouth at bedtime.    . vitamin B-12 (CYANOCOBALAMIN) 1000 MCG tablet Take 1,000 mcg by mouth daily.     No current facility-administered medications on file prior to visit.    Past Surgical History  Procedure Laterality Date  . Abdominal hysterectomy    . Cholecystectomy      Allergies  Allergen Reactions  . Penicillins    Reaction: unknown  . Latex Rash    History   Social History  . Marital Status: Married    Spouse Name: N/A    Number of Children: N/A  . Years of Education: N/A   Occupational History  . Not on file.   Social History Main Topics  . Smoking status: Former Research scientist (life sciences)  . Smokeless tobacco: Not on file  . Alcohol Use: No  . Drug Use: No  . Sexual Activity: No   Other Topics Concern  . Not on file   Social History Narrative    No family history on file.  BP 130/74 mmHg  Pulse 80  Ht 5\' 4"  (1.626 m)  Wt 135 lb (61.236 kg)  BMI 23.16 kg/m2  Review of Systems: See HPI above.    Objective:  Physical Exam:  Gen: NAD  Back: No gross deformity, scoliosis. TTP mid-thoracic spine just right of midline - less at midline here.  No other neck/back tenderness. Pain with bilateral trunk rotation. Strength LEs 5/5 all muscle groups.   Negative SLRs. Sensation intact to light touch bilaterally. Negative logroll bilateral hips    Assessment & Plan:  1. Thoracic spine compression fracture - acute at T7 with 50-60% loss.  We discussed treatment for this is mainly supportive with oxycodone as needed for pain.  Icing (  or heat) as needed.  Continue calcium and vitamin D.  We discussed calcitonin nasal spray - patient declined for now.  Discuss osteoporosis treatment with PCP, possibly repeat bone density test depending on how long ago this was (we do not have access to this).  If PCP does not treat osteoporosis advised her to call me and we will refer to endocrinology.  F/u in 4 weeks.

## 2014-01-19 NOTE — Assessment & Plan Note (Signed)
acute at T7 with 50-60% loss.  We discussed treatment for this is mainly supportive with oxycodone as needed for pain.  Icing (or heat) as needed.  Continue calcium and vitamin D.  We discussed calcitonin nasal spray - patient declined for now.  Discuss osteoporosis treatment with PCP, possibly repeat bone density test depending on how long ago this was (we do not have access to this).  If PCP does not treat osteoporosis advised her to call me and we will refer to endocrinology.  F/u in 4 weeks.

## 2014-01-31 ENCOUNTER — Emergency Department (HOSPITAL_COMMUNITY): Payer: Medicare Other

## 2014-01-31 ENCOUNTER — Encounter (HOSPITAL_COMMUNITY): Payer: Self-pay | Admitting: Emergency Medicine

## 2014-01-31 ENCOUNTER — Inpatient Hospital Stay (HOSPITAL_COMMUNITY)
Admission: EM | Admit: 2014-01-31 | Discharge: 2014-02-09 | DRG: 065 | Disposition: A | Discharge status: Rehabilitation Facility | Payer: Medicare Other | Attending: Internal Medicine | Admitting: Internal Medicine

## 2014-01-31 ENCOUNTER — Inpatient Hospital Stay (HOSPITAL_COMMUNITY): Payer: Medicare Other

## 2014-01-31 DIAGNOSIS — Z452 Encounter for adjustment and management of vascular access device: Secondary | ICD-10-CM

## 2014-01-31 DIAGNOSIS — I951 Orthostatic hypotension: Secondary | ICD-10-CM | POA: Diagnosis not present

## 2014-01-31 DIAGNOSIS — I639 Cerebral infarction, unspecified: Secondary | ICD-10-CM

## 2014-01-31 DIAGNOSIS — J323 Chronic sphenoidal sinusitis: Secondary | ICD-10-CM | POA: Diagnosis not present

## 2014-01-31 DIAGNOSIS — E871 Hypo-osmolality and hyponatremia: Secondary | ICD-10-CM | POA: Diagnosis not present

## 2014-01-31 DIAGNOSIS — E876 Hypokalemia: Secondary | ICD-10-CM | POA: Diagnosis present

## 2014-01-31 DIAGNOSIS — Z6821 Body mass index (BMI) 21.0-21.9, adult: Secondary | ICD-10-CM | POA: Diagnosis not present

## 2014-01-31 DIAGNOSIS — F0390 Unspecified dementia without behavioral disturbance: Secondary | ICD-10-CM | POA: Diagnosis present

## 2014-01-31 DIAGNOSIS — I10 Essential (primary) hypertension: Secondary | ICD-10-CM | POA: Diagnosis not present

## 2014-01-31 DIAGNOSIS — E46 Unspecified protein-calorie malnutrition: Secondary | ICD-10-CM | POA: Diagnosis present

## 2014-01-31 DIAGNOSIS — E440 Moderate protein-calorie malnutrition: Secondary | ICD-10-CM | POA: Diagnosis present

## 2014-01-31 DIAGNOSIS — I63442 Cerebral infarction due to embolism of left cerebellar artery: Secondary | ICD-10-CM | POA: Diagnosis present

## 2014-01-31 DIAGNOSIS — E785 Hyperlipidemia, unspecified: Secondary | ICD-10-CM | POA: Diagnosis present

## 2014-01-31 DIAGNOSIS — Z7982 Long term (current) use of aspirin: Secondary | ICD-10-CM | POA: Diagnosis not present

## 2014-01-31 DIAGNOSIS — G911 Obstructive hydrocephalus: Secondary | ICD-10-CM | POA: Insufficient documentation

## 2014-01-31 DIAGNOSIS — G936 Cerebral edema: Secondary | ICD-10-CM | POA: Diagnosis present

## 2014-01-31 DIAGNOSIS — R260 Ataxic gait: Secondary | ICD-10-CM | POA: Diagnosis not present

## 2014-01-31 DIAGNOSIS — R42 Dizziness and giddiness: Secondary | ICD-10-CM

## 2014-01-31 DIAGNOSIS — I63549 Cerebral infarction due to unspecified occlusion or stenosis of unspecified cerebellar artery: Secondary | ICD-10-CM | POA: Diagnosis present

## 2014-01-31 DIAGNOSIS — Z87891 Personal history of nicotine dependence: Secondary | ICD-10-CM | POA: Diagnosis not present

## 2014-01-31 DIAGNOSIS — R110 Nausea: Secondary | ICD-10-CM | POA: Diagnosis present

## 2014-01-31 LAB — CBC WITH DIFFERENTIAL/PLATELET
Basophils Absolute: 0 10*3/uL (ref 0.0–0.1)
Basophils Relative: 0 % (ref 0–1)
EOS ABS: 0 10*3/uL (ref 0.0–0.7)
EOS PCT: 0 % (ref 0–5)
HEMATOCRIT: 36.2 % (ref 36.0–46.0)
HEMOGLOBIN: 12.6 g/dL (ref 12.0–15.0)
LYMPHS PCT: 10 % — AB (ref 12–46)
Lymphs Abs: 0.6 10*3/uL — ABNORMAL LOW (ref 0.7–4.0)
MCH: 32.5 pg (ref 26.0–34.0)
MCHC: 34.8 g/dL (ref 30.0–36.0)
MCV: 93.3 fL (ref 78.0–100.0)
MONO ABS: 0.3 10*3/uL (ref 0.1–1.0)
MONOS PCT: 5 % (ref 3–12)
NEUTROS ABS: 5 10*3/uL (ref 1.7–7.7)
Neutrophils Relative %: 85 % — ABNORMAL HIGH (ref 43–77)
Platelets: 281 10*3/uL (ref 150–400)
RBC: 3.88 MIL/uL (ref 3.87–5.11)
RDW: 11.9 % (ref 11.5–15.5)
WBC: 5.9 10*3/uL (ref 4.0–10.5)

## 2014-01-31 LAB — COMPREHENSIVE METABOLIC PANEL
ALK PHOS: 143 U/L — AB (ref 39–117)
ALT: 25 U/L (ref 0–35)
ANION GAP: 7 (ref 5–15)
AST: 28 U/L (ref 0–37)
Albumin: 3.6 g/dL (ref 3.5–5.2)
BILIRUBIN TOTAL: 0.6 mg/dL (ref 0.3–1.2)
BUN: 8 mg/dL (ref 6–23)
CHLORIDE: 103 mmol/L (ref 96–112)
CO2: 25 mmol/L (ref 19–32)
CREATININE: 0.92 mg/dL (ref 0.50–1.10)
Calcium: 9 mg/dL (ref 8.4–10.5)
GFR, EST AFRICAN AMERICAN: 67 mL/min — AB (ref >90)
GFR, EST NON AFRICAN AMERICAN: 58 mL/min — AB (ref >90)
GLUCOSE: 122 mg/dL — AB (ref 70–99)
Potassium: 4.2 mmol/L (ref 3.5–5.1)
Sodium: 135 mmol/L (ref 135–145)
TOTAL PROTEIN: 6.6 g/dL (ref 6.0–8.3)

## 2014-01-31 LAB — MRSA PCR SCREENING: MRSA by PCR: NEGATIVE

## 2014-01-31 MED ORDER — SENNOSIDES-DOCUSATE SODIUM 8.6-50 MG PO TABS
1.0000 | ORAL_TABLET | Freq: Every evening | ORAL | Status: DC | PRN
  Filled 2014-01-31 (×2): qty 1

## 2014-01-31 MED ORDER — ADULT MULTIVITAMIN W/MINERALS CH
1.0000 | ORAL_TABLET | Freq: Every day | ORAL | Status: DC
  Administered 2014-02-01 – 2014-02-06 (×5): 1 via ORAL
  Filled 2014-01-31 (×10): qty 1

## 2014-01-31 MED ORDER — OXYCODONE-ACETAMINOPHEN 5-325 MG PO TABS: 1.0000 | ORAL_TABLET | Freq: Four times a day (QID) | ORAL | Status: DC | PRN

## 2014-01-31 MED ORDER — SODIUM CHLORIDE 0.9 % IV SOLN
INTRAVENOUS | Status: DC
  Administered 2014-01-31 – 2014-02-04 (×7): via INTRAVENOUS

## 2014-01-31 MED ORDER — STROKE: EARLY STAGES OF RECOVERY BOOK
Freq: Once | Status: AC
  Administered 2014-01-31: 20:00:00
  Filled 2014-01-31: qty 1

## 2014-01-31 MED ORDER — VITAMIN B-12 1000 MCG PO TABS
1000.0000 ug | ORAL_TABLET | Freq: Every day | ORAL | Status: DC
  Administered 2014-02-01 – 2014-02-06 (×6): 1000 ug via ORAL
  Filled 2014-01-31 (×9): qty 1

## 2014-01-31 MED ORDER — ATORVASTATIN CALCIUM 80 MG PO TABS
80.0000 mg | ORAL_TABLET | Freq: Every morning | ORAL | Status: DC
  Administered 2014-02-01 – 2014-02-08 (×8): 80 mg via ORAL
  Filled 2014-01-31 (×9): qty 1

## 2014-01-31 MED ORDER — PNEUMOCOCCAL VAC POLYVALENT 25 MCG/0.5ML IJ INJ
0.5000 mL | INJECTION | INTRAMUSCULAR | Status: AC
  Administered 2014-02-01: 0.5 mL via INTRAMUSCULAR
  Filled 2014-01-31: qty 0.5

## 2014-01-31 MED ORDER — ENOXAPARIN SODIUM 40 MG/0.4ML ~~LOC~~ SOLN
40.0000 mg | SUBCUTANEOUS | Status: DC
  Administered 2014-02-02 – 2014-02-08 (×7): 40 mg via SUBCUTANEOUS
  Filled 2014-01-31 (×10): qty 0.4

## 2014-01-31 MED ORDER — HYDRALAZINE HCL 20 MG/ML IJ SOLN: 10.0000 mg | Freq: Three times a day (TID) | INTRAMUSCULAR | Status: DC | PRN

## 2014-01-31 MED ORDER — VITAMIN D3 25 MCG (1000 UNIT) PO TABS
1000.0000 [IU] | ORAL_TABLET | Freq: Two times a day (BID) | ORAL | Status: DC
  Administered 2014-01-31 – 2014-02-07 (×13): 1000 [IU] via ORAL
  Filled 2014-01-31 (×20): qty 1

## 2014-01-31 MED ORDER — ASPIRIN 325 MG PO TABS
325.0000 mg | ORAL_TABLET | Freq: Every day | ORAL | Status: DC
  Filled 2014-01-31 (×2): qty 1

## 2014-01-31 MED ORDER — PANTOPRAZOLE SODIUM 40 MG PO TBEC
40.0000 mg | DELAYED_RELEASE_TABLET | Freq: Every day | ORAL | Status: DC
  Administered 2014-01-31 – 2014-02-08 (×9): 40 mg via ORAL
  Filled 2014-01-31 (×9): qty 1

## 2014-01-31 MED ORDER — ONDANSETRON HCL 4 MG/2ML IJ SOLN
4.0000 mg | Freq: Once | INTRAMUSCULAR | Status: AC
  Administered 2014-01-31: 4 mg via INTRAVENOUS
  Filled 2014-01-31: qty 2

## 2014-01-31 MED ORDER — ONDANSETRON HCL 4 MG/2ML IJ SOLN
4.0000 mg | Freq: Four times a day (QID) | INTRAMUSCULAR | Status: DC | PRN
  Administered 2014-02-01 – 2014-02-02 (×4): 4 mg via INTRAVENOUS
  Filled 2014-01-31 (×4): qty 2

## 2014-01-31 NOTE — ED Notes (Signed)
[facility] here to transport pt to [facility]. 

## 2014-01-31 NOTE — ED Provider Notes (Signed)
CSN: [id number]     Arrival date & time 01/31/14  1527 History   First MD Initiated Contact with Patient 01/31/14 1540     Chief Complaint  Patient presents with  . Dizziness     Patient is a 81 y.o. female presenting with dizziness. The history is provided by the patient and a relative. No language interpreter was used.  Dizziness  [name] presents for evaluation of dizziness.she woke this morning at 4 AM complaining of dizziness and feeling funny all over. The symptoms are described as the room spinning. She had difficulty ambulating and needed assistance getting to the bathroom. She would fall back towards the bed if she did not have assistance. She had associated severe nausea and dry heaves. Symptoms were constant for several hours and then began to improve without intervention. She is currently living with her daughter due to collapsed vertebrae. She's had multiple similar symptoms in the past that have resolved over 12-24 hours, last episode was December 7-8. Her daughter is concerned that these may be strokes or mini strokes or causing her symptoms. Symptoms are moderate, intermittent and improving. Patient currently reports feeling poorly and some nausea but no current dizziness.  Past Medical History  Diagnosis Date  . Hypertension   . Hyperlipemia   . Dementia   . Cerebral hemorrhage, nontraumatic 2011  . CVA (cerebral infarction) 2011    left cerebellum  . Ureterocele, acquired    Past Surgical History  Procedure Laterality Date  . Abdominal hysterectomy    . Cholecystectomy     History reviewed. No pertinent family history. History  Substance Use Topics  . Smoking status: Former Research scientist (life sciences)  . Smokeless tobacco: Not on file  . Alcohol Use: No   OB History    No data available     Review of Systems  Neurological: Positive for dizziness.  All other systems reviewed and are negative.     Allergies  Penicillins and Latex  Home Medications   Prior to Admission  medications   Medication Sig Start Date End Date Taking? Authorizing Provider  amlodipine-olmesartan (AZOR) 10-20 MG per tablet Take 1 tablet by mouth daily.    Historical Provider, MD  aspirin EC 81 MG tablet Take 81 mg by mouth every morning.    Historical Provider, MD  atorvastatin (LIPITOR) 80 MG tablet Take 80 mg by mouth every morning.    Historical Provider, MD  cholecalciferol (VITAMIN D) 1000 UNITS tablet Take 1,000 Units by mouth 2 (two) times daily.    Historical Provider, MD  ibuprofen (ADVIL,MOTRIN) 200 MG tablet Take 200 mg by mouth every 6 (six) hours as needed. For pain    Historical Provider, MD  Menthol, Topical Analgesic, (BIOFREEZE EX) Apply 1 application topically 2 (two) times daily as needed. For arthritis pain in neck    Historical Provider, MD  Multiple Vitamin (MULTIVITAMIN WITH MINERALS) TABS Take 1 tablet by mouth daily.    Historical Provider, MD  omeprazole (PRILOSEC) 40 MG capsule Take 40 mg by mouth every morning.    Historical Provider, MD  oxyCODONE-acetaminophen (PERCOCET/ROXICET) 5-325 MG per tablet Take 1 tablet by mouth every 6 (six) hours as needed for moderate pain or severe pain. 01/17/14   Dene Gentry, MD  traZODone (DESYREL) 50 MG tablet Take 50 mg by mouth at bedtime.    Historical Provider, MD  vitamin B-12 (CYANOCOBALAMIN) 1000 MCG tablet Take 1,000 mcg by mouth daily.    Historical Provider, MD   BP 152/72  mmHg  Pulse 84  Temp(Src) 98.9 F (37.2 C) (Oral)  Resp 12  SpO2 100% Physical Exam  Constitutional: She appears well-developed and well-nourished.  HENT:  Head: Normocephalic and atraumatic.  Eyes: EOM are normal. Pupils are equal, round, and reactive to light.  Cardiovascular: Normal rate and regular rhythm.   No murmur heard. Pulmonary/Chest: Effort normal and breath sounds normal. No respiratory distress.  Abdominal: Soft. There is no tenderness. There is no rebound and no guarding.  Musculoskeletal: She exhibits no edema or  tenderness.  Neurological: She is alert. No cranial nerve deficit. She exhibits normal muscle tone. Coordination normal.  5 out of 5 strength in all 4 extremities, disoriented to time.  Skin: Skin is warm and dry.  Psychiatric: She has a normal mood and affect. Her behavior is normal.  Nursing note and vitals reviewed.   ED Course  Procedures (including critical care time) Labs Review Labs Reviewed  COMPREHENSIVE METABOLIC PANEL - Abnormal; Notable for the following:    Glucose, Bld 122 (*)    Alkaline Phosphatase 143 (*)    GFR calc non Af Amer 58 (*)    GFR calc Af Amer 67 (*)    All other components within normal limits  CBC WITH DIFFERENTIAL/PLATELET - Abnormal; Notable for the following:    Neutrophils Relative % 85 (*)    Lymphocytes Relative 10 (*)    Lymphs Abs 0.6 (*)    All other components within normal limits  URINALYSIS, ROUTINE W REFLEX MICROSCOPIC    Imaging Review Dg Chest 2 View  01/31/2014   CLINICAL DATA:  Nausea.  Dizziness.  Initial encounter.  EXAM: CHEST  2 VIEW  COMPARISON:  01/25/2010.  FINDINGS: Lung volumes are lower than on prior. There is opacity over the lower lobes on the lateral view which is most compatible with atelectasis based on the lung volumes. Pneumonia is considered less likely.  In the interval since the prior exam from 2012, mid thoracic compression fracture has developed with about 75% maximal loss of vertebral body height. This appears at about the T7 level  IMPRESSION: No acute cardiopulmonary disease. Lower lung volumes than on prior with basilar atelectasis.   Electronically Signed   By: Dereck Ligas M.D.   On: 01/31/2014 16:37   Ct Head Wo Contrast  01/31/2014   CLINICAL DATA:  Worsening lightheadedness.  EXAM: CT HEAD WITHOUT CONTRAST  TECHNIQUE: Contiguous axial images were obtained from the base of the skull through the vertex without intravenous contrast.  COMPARISON:  01/16/2014  FINDINGS: There is wedge-shaped low-attenuation  involving the inferior left cerebellar hemisphere, new from 01/16/2014. There also is a smaller focus of low attenuation in the medial inferior right cerebellar hemisphere. These posterior fossa abnormalities likely represent subacute nonhemorrhagic infarctions. There is minimal mass effect on the fourth ventricle. No other subacute infarctions are evident. There is moderate generalized atrophy. There is mild to moderate chronic microvascular ischemic change in the deep white matter. There is no intracranial hemorrhage. There is no mass.  There is a right sphenoid sinus air-fluid level. There is retained secretion within the left sphenoid sinus.  IMPRESSION: Subacute nonhemorrhagic infarction involving a large portion of the left cerebellar hemisphere inferiorly as well as a small portion of the right cerebellar hemisphere inferomedially. Minimal mass effect on the fourth ventricle.  Moderate atrophy and microvascular disease in the cerebral hemispheres.  Sphenoid sinus disease bilaterally.   Electronically Signed   By: Andreas Newport M.D.   On: 01/31/2014  16:48     EKG Interpretation   Date/Time:  Monday January 31 2014 15:36:28 EST Ventricular Rate:  84 PR Interval:  168 QRS Duration: 77 QT Interval:  382 QTC Calculation: 451 R Axis:   2 Text Interpretation:  Sinus rhythm Low voltage, precordial leads Consider  right ventricular hypertrophy Confirmed by Hazle Coca [id number]) on 01/31/2014  4:30:47 PM      MDM   Final diagnoses:  Cerebellar stroke, acute   Patient here for nausea, dizziness.neurologic exam is nonfocal. Patient is symptomatically initial evaluation, but did develop recurrent symptoms on recheck in the department. CT scan is consistent with subacute CVA in the cerebellar region.  D/w Dr. Nicole Kindred with Neurology - will see the patient in consultation.  He would like the patient to go to [facility] for admission.  Discussed with hospitalist regarding admission.  Patient not a TPA  candidate due to duration of symptoms.    Quintella Reichert, MD 02/01/14 0001

## 2014-01-31 NOTE — Consult Note (Signed)
Referring Physician: REES, E    Chief Complaint: New onset nausea and vomiting as well as dizziness and unstable gait.  HPI: [name] is an 81 y.o. female history of hypertension, hyperlipidemia, dementia, nontraumatic cerebral hemorrhage and ureterocele, presenting with new onset vertigo with nausea and vomiting as well as unstable gait. Patient was last known well at 10 PM last night. She noticed symptoms when she attempted to sit on the side of her bed at 4 AM. She's had no change in speech. No focal weakness has been noted. She said no visual changes. She's been taking aspirin daily. CT scan of her head showed nonhemorrhagic infarction involving a large portion of the left cerebral hemisphere inferiorly as well as a small portion of the right cerebellar hemisphere inferomedially. There was slight mass effect on fourth ventricle. NIH stroke score was 0.  LSN: 10 PM on 01/30/2014 tPA Given: No: Beyond time window for treatment consideration mRankin:  Past Medical History  Diagnosis Date  . Hypertension   . Hyperlipemia   . Dementia   . Cerebral hemorrhage, nontraumatic 2011  . CVA (cerebral infarction) 2011    left cerebellum  . Ureterocele, acquired     Family history: Positive for stroke involving her mother.  Medications: I have reviewed the patient's current medications.  ROS: History obtained from the patient and patient's daughter-in-law.  General ROS: negative for - chills, fatigue, fever, night sweats, weight gain or weight loss Psychological ROS: negative for - behavioral disorder, hallucinations, memory difficulties, mood swings or suicidal ideation Ophthalmic ROS: negative for - blurry vision, double vision, eye pain or loss of vision ENT ROS: negative for - epistaxis, nasal discharge, oral lesions, sore throat, tinnitus or vertigo Allergy and Immunology ROS: negative for - hives or itchy/watery eyes Hematological and Lymphatic ROS: negative for - bleeding  problems, bruising or swollen lymph nodes Endocrine ROS: negative for - galactorrhea, hair pattern changes, polydipsia/polyuria or temperature intolerance Respiratory ROS: negative for - cough, hemoptysis, shortness of breath or wheezing Cardiovascular ROS: negative for - chest pain, dyspnea on exertion, edema or irregular heartbeat Gastrointestinal ROS: negative for - abdominal pain, diarrhea, hematemesis, nausea/vomiting or stool incontinence Genito-Urinary ROS: negative for - dysuria, hematuria, incontinence or urinary frequency/urgency Musculoskeletal ROS: negative for - joint swelling or muscular weakness Neurological ROS: as noted in HPI Dermatological ROS: negative for rash and skin lesion changes  Physical Examination: Blood pressure 152/72, pulse 81, temperature 98.9 F (37.2 C), temperature source Oral, resp. rate 15, SpO2 97 %. Appearance was that of slightly elderly lady of medium build who was alert and in moderate distress with nausea and complaint of headache. HEENT: Normal Neck was supple with normal range of motion. Heart rate and rhythm were normal. Heart sounds were normal. Extremities were normal in appearance with no edema and no significant discoloration.  Neurologic Examination: Mental Status: Alert, oriented, thought content appropriate.  Speech fluent without evidence of aphasia. Able to follow commands without difficulty. Cranial Nerves: II-Visual fields were normal. III/IV/VI-Pupils were equal and reacted. Extraocular movements were full and conjugate.    V/VII-no facial numbness and no facial weakness. VIII-normal. X-normal speech and symmetrical palatal movement. XII-midline tongue extension Motor: 5/5 bilaterally with normal tone and bulk Sensory: Normal throughout. Deep Tendon Reflexes: 2+ and brisk; symmetric. Plantars: Flexor bilaterally Cerebellar: Normal finger-to-nose testing. Carotid auscultation: Normal  Dg Chest 2 View  01/31/2014   CLINICAL  DATA:  Nausea.  Dizziness.  Initial encounter.  EXAM: CHEST  2 VIEW  COMPARISON:  01/25/2010.  FINDINGS: Lung volumes are lower than on prior. There is opacity over the lower lobes on the lateral view which is most compatible with atelectasis based on the lung volumes. Pneumonia is considered less likely.  In the interval since the prior exam from 2012, mid thoracic compression fracture has developed with about 75% maximal loss of vertebral body height. This appears at about the T7 level  IMPRESSION: No acute cardiopulmonary disease. Lower lung volumes than on prior with basilar atelectasis.   Electronically Signed   By: Dereck Ligas M.D.   On: 01/31/2014 16:37   Ct Head Wo Contrast  01/31/2014   CLINICAL DATA:  Worsening lightheadedness.  EXAM: CT HEAD WITHOUT CONTRAST  TECHNIQUE: Contiguous axial images were obtained from the base of the skull through the vertex without intravenous contrast.  COMPARISON:  01/16/2014  FINDINGS: There is wedge-shaped low-attenuation involving the inferior left cerebellar hemisphere, new from 01/16/2014. There also is a smaller focus of low attenuation in the medial inferior right cerebellar hemisphere. These posterior fossa abnormalities likely represent subacute nonhemorrhagic infarctions. There is minimal mass effect on the fourth ventricle. No other subacute infarctions are evident. There is moderate generalized atrophy. There is mild to moderate chronic microvascular ischemic change in the deep white matter. There is no intracranial hemorrhage. There is no mass.  There is a right sphenoid sinus air-fluid level. There is retained secretion within the left sphenoid sinus.  IMPRESSION: Subacute nonhemorrhagic infarction involving a large portion of the left cerebellar hemisphere inferiorly as well as a small portion of the right cerebellar hemisphere inferomedially. Minimal mass effect on the fourth ventricle.  Moderate atrophy and microvascular disease in the cerebral  hemispheres.  Sphenoid sinus disease bilaterally.   Electronically Signed   By: Andreas Newport M.D.   On: 01/31/2014 16:48    Assessment: 81 y.o. female with multiple risk factors for stroke well his previous cerebellar stroke, presenting with acute bilateral inferior cerebellar ischemic infarctions with slight mass effect on fourth ventricle.  Stroke Risk Factors - family history, hyperlipidemia and hypertension  Plan: 1. Transfer to [facility] for admission; recommend step down unit for monitoring and close observation, given the potential for cerebellar edema and mass effect on fourth ventricle and brainstem 2. MRI, MRA  of the brain without contrast 3. PT consult, OT consult, Speech consult 4. Echocardiogram 5. Carotid dopplers 6. Prophylactic therapy-Antiplatelet med: Aspirin  7. Risk factor modification 8. HgbA1c, fasting lipid panel   C.R. Nicole Kindred, MD [facility]  01/31/2014, 5:54 PM

## 2014-01-31 NOTE — ED Notes (Signed)
[facility] was notified of pt's transfer to [facility]. 

## 2014-01-31 NOTE — Plan of Care (Signed)
Problem: Acute Treatment Outcomes Goal: Neuro exam at baseline or improved Outcome: Completed/Met Date Met:  01/31/14 Pt at baseline, no deficits noted Goal: 02 Sats > 94% Outcome: Completed/Met Date Met:  01/31/14 Pt sats 94% and above on RA

## 2014-01-31 NOTE — ED Notes (Signed)
Brought in by EMS from home with c/o dizziness.  Pt reported that she has hx of dizziness for years, but when she woke up this morning, she got so dizzy she almost fell--- pt denied syncope.  Pt reported that her dizziness is more persistent than usual and decided to cal  EMS for further evaluation.  Pt's CBG was 106 by EMS.  Pt also c/o nausea, no abdominal pain.  Pt was given NS 500 ml bolus and Zofran 4 mg IV en route to ED.  Pt presents to ED A/Ox4, denies pain or headache.

## 2014-01-31 NOTE — ED Notes (Signed)
Patient too sick to give sample.

## 2014-01-31 NOTE — H&P (Signed)
[facility] History and Physical  Samadhi Mahurin BMW:[id number] DOB: 09-28-1933 DOA: 01/31/2014  Referring physician:  PCP: Abigail Miyamoto, MD  Specialists:   Chief Complaint: dizziness   HPI: [name] is a 81 y.o. female with PMH of HTN, HPL, Spinal DJD, compression fracture at T7, h/o CVA, dementia, chronic intermittent dizziness (1-2 years) presented with worsening dizziness, vertigo since 4.00 AM; Patient reports difficulty with ambulation due to recent spinal fracture. Today she woke up with sever vertigo, associated wit nausea, ataxia, which resulted in  Fall; she fell towards the bed, no LOC, denies focal weakness or paraesthesia; denies SOB, no chest pains, no fever,chills;  -Ed: CT head: + acute/subacute CVA; d/w neurology who recommended to TF to [facility]     Review of Systems: The patient denies anorexia, fever, weight loss,, vision loss, decreased hearing, hoarseness, chest pain, syncope, dyspnea on exertion, peripheral edema, balance deficits, hemoptysis, abdominal pain, melena, hematochezia, severe indigestion/heartburn, hematuria, incontinence, genital sores, muscle weakness, suspicious skin lesions, transient blindness, difficulty walking, depression, unusual weight change, abnormal bleeding, enlarged lymph nodes, angioedema, and breast masses.    Past Medical History  Diagnosis Date  . Hypertension   . Hyperlipemia   . Dementia   . Cerebral hemorrhage, nontraumatic 2011  . CVA (cerebral infarction) 2011    left cerebellum  . Ureterocele, acquired    Past Surgical History  Procedure Laterality Date  . Abdominal hysterectomy    . Cholecystectomy     Social History:  reports that she has quit smoking. She does not have any smokeless tobacco history on file. She reports that she does not drink alcohol or use illicit drugs. Home;  where does patient live--home, ALF, SNF? and with whom if at home? Yes;  Can patient participate in ADLs?  Allergies   Allergen Reactions  . Penicillins     Reaction: unknown  . Latex Rash    History reviewed. No pertinent family history.  (be sure to complete)  Prior to Admission medications   Medication Sig Start Date End Date Taking? Authorizing Provider  amLODipine-olmesartan (AZOR) 5-40 MG per tablet Take 1 tablet by mouth daily.   Yes Historical Provider, MD  aspirin EC 81 MG tablet Take 81 mg by mouth every morning.   Yes Historical Provider, MD  atorvastatin (LIPITOR) 80 MG tablet Take 80 mg by mouth every morning.   Yes Historical Provider, MD  cholecalciferol (VITAMIN D) 1000 UNITS tablet Take 1,000 Units by mouth 2 (two) times daily.   Yes Historical Provider, MD  Multiple Vitamin (MULTIVITAMIN WITH MINERALS) TABS Take 1 tablet by mouth daily.   Yes Historical Provider, MD  omeprazole (PRILOSEC) 40 MG capsule Take 40 mg by mouth every morning.   Yes Historical Provider, MD  oxyCODONE-acetaminophen (PERCOCET/ROXICET) 5-325 MG per tablet Take 1 tablet by mouth every 6 (six) hours as needed for moderate pain or severe pain. 01/17/14  Yes Dene Gentry, MD  vitamin B-12 (CYANOCOBALAMIN) 1000 MCG tablet Take 1,000 mcg by mouth daily.   Yes Historical Provider, MD   Physical Exam: Filed Vitals:   01/31/14 1700  BP:   Pulse: 81  Temp:   Resp: 15     General:  Alert, oriented   Eyes: eom-i, perrla  ENT: no oral ulcers   Neck: supple   Cardiovascular: s1,s2 rrr  Respiratory: CTA BL  Abdomen: soft, nt,nd   Skin: no rash   Musculoskeletal: no LE edema  Psychiatric: no hallucinations   Neurologic: CN 2-12 intact,  motor 5/5 BL symmetric, sensation is intact; unable to assess gait due to back pain   Labs on Admission:  Basic Metabolic Panel:  Recent Labs Lab 01/31/14 1604  NA 135  K 4.2  CL 103  CO2 25  GLUCOSE 122*  BUN 8  CREATININE 0.92  CALCIUM 9.0   Liver Function Tests:  Recent Labs Lab 01/31/14 1604  AST 28  ALT 25  ALKPHOS 143*  BILITOT 0.6  PROT 6.6   ALBUMIN 3.6   No results for input(s): LIPASE, AMYLASE in the last 168 hours. No results for input(s): AMMONIA in the last 168 hours. CBC:  Recent Labs Lab 01/31/14 1604  WBC 5.9  NEUTROABS 5.0  HGB 12.6  HCT 36.2  MCV 93.3  PLT 281   Cardiac Enzymes: No results for input(s): CKTOTAL, CKMB, CKMBINDEX, TROPONINI in the last 168 hours.  BNP (last 3 results) No results for input(s): PROBNP in the last 8760 hours. CBG: No results for input(s): GLUCAP in the last 168 hours.  Radiological Exams on Admission: Dg Chest 2 View  01/31/2014   CLINICAL DATA:  Nausea.  Dizziness.  Initial encounter.  EXAM: CHEST  2 VIEW  COMPARISON:  01/25/2010.  FINDINGS: Lung volumes are lower than on prior. There is opacity over the lower lobes on the lateral view which is most compatible with atelectasis based on the lung volumes. Pneumonia is considered less likely.  In the interval since the prior exam from 2012, mid thoracic compression fracture has developed with about 75% maximal loss of vertebral body height. This appears at about the T7 level  IMPRESSION: No acute cardiopulmonary disease. Lower lung volumes than on prior with basilar atelectasis.   Electronically Signed   By: Dereck Ligas M.D.   On: 01/31/2014 16:37   Ct Head Wo Contrast  01/31/2014   CLINICAL DATA:  Worsening lightheadedness.  EXAM: CT HEAD WITHOUT CONTRAST  TECHNIQUE: Contiguous axial images were obtained from the base of the skull through the vertex without intravenous contrast.  COMPARISON:  01/16/2014  FINDINGS: There is wedge-shaped low-attenuation involving the inferior left cerebellar hemisphere, new from 01/16/2014. There also is a smaller focus of low attenuation in the medial inferior right cerebellar hemisphere. These posterior fossa abnormalities likely represent subacute nonhemorrhagic infarctions. There is minimal mass effect on the fourth ventricle. No other subacute infarctions are evident. There is moderate  generalized atrophy. There is mild to moderate chronic microvascular ischemic change in the deep white matter. There is no intracranial hemorrhage. There is no mass.  There is a right sphenoid sinus air-fluid level. There is retained secretion within the left sphenoid sinus.  IMPRESSION: Subacute nonhemorrhagic infarction involving a large portion of the left cerebellar hemisphere inferiorly as well as a small portion of the right cerebellar hemisphere inferomedially. Minimal mass effect on the fourth ventricle.  Moderate atrophy and microvascular disease in the cerebral hemispheres.  Sphenoid sinus disease bilaterally.   Electronically Signed   By: Andreas Newport M.D.   On: 01/31/2014 16:48    EKG: Independently reviewed.   Assessment/Plan Active Problems:   Dementia   Acute CVA (cerebrovascular accident)   81 y.o. female with PMH of HTN, HPL, Spinal DJD, compression fracture at T7, h/o CVA, dementia, chronic intermittent dizziness (1-2 years) presented with worsening dizziness, vertigo since 4.00 AM;  -admitted with acute/subacute CVA  1. Acute/subacute CVA with dizziness, ataxia; CT head: cerebellar infarcts; symptoms resolving; not a TPA candidate  -admit to [facility]U at [facility] (per neurology request);  obtain CVA work up, monitor on tele; cont ASA-increased to 325, may need to add plavix; neurology following     2. HTN allow mild permissive HTN; hold BP meds tonight; prn hydrazine  3. HPL, cont statins; check Lipids 4. Recent spinal compression fracture, cont pain control; PT eval   Neurology;  if consultant consulted, please document name and whether formally or informally consulted  Code Status: full (must indicate code status--if unknown or must be presumed, indicate so) Family Communication: d/w patient, her daughter  (indicate person spoken with, if applicable, with phone number if by telephone) Disposition Plan: pend PT eval  (indicate anticipated LOS)  Time spent: >35 minutes    Kinnie Feil [facility] Pager [phone]  If 7PM-7AM, please contact night-coverage [email or web address] Password [email or web address] 01/31/2014, 5:37 PM

## 2014-01-31 NOTE — ED Notes (Signed)
Bed: WA09 Expected date:  Expected time:  Means of arrival:  Comments: 

## 2014-02-01 ENCOUNTER — Inpatient Hospital Stay (HOSPITAL_COMMUNITY): Payer: Medicare Other

## 2014-02-01 DIAGNOSIS — R110 Nausea: Secondary | ICD-10-CM | POA: Diagnosis present

## 2014-02-01 DIAGNOSIS — G936 Cerebral edema: Secondary | ICD-10-CM | POA: Diagnosis present

## 2014-02-01 DIAGNOSIS — I63442 Cerebral infarction due to embolism of left cerebellar artery: Secondary | ICD-10-CM | POA: Diagnosis not present

## 2014-02-01 DIAGNOSIS — E785 Hyperlipidemia, unspecified: Secondary | ICD-10-CM

## 2014-02-01 DIAGNOSIS — I639 Cerebral infarction, unspecified: Secondary | ICD-10-CM | POA: Diagnosis present

## 2014-02-01 DIAGNOSIS — I6789 Other cerebrovascular disease: Secondary | ICD-10-CM

## 2014-02-01 LAB — URINE MICROSCOPIC-ADD ON

## 2014-02-01 LAB — URINALYSIS, ROUTINE W REFLEX MICROSCOPIC
BILIRUBIN URINE: NEGATIVE
Glucose, UA: NEGATIVE mg/dL
HGB URINE DIPSTICK: NEGATIVE
Ketones, ur: 15 mg/dL — AB
Nitrite: NEGATIVE
PH: 8 (ref 5.0–8.0)
PROTEIN: NEGATIVE mg/dL
SPECIFIC GRAVITY, URINE: 1.012 (ref 1.005–1.030)
UROBILINOGEN UA: 0.2 mg/dL (ref 0.0–1.0)

## 2014-02-01 LAB — LIPID PANEL
Cholesterol: 126 mg/dL (ref 0–200)
HDL: 46 mg/dL (ref >39)
LDL CALC: 71 mg/dL (ref 0–99)
Total CHOL/HDL Ratio: 2.7 RATIO
Triglycerides: 46 mg/dL (ref <150)
VLDL: 9 mg/dL (ref 0–40)

## 2014-02-01 LAB — HEMOGLOBIN A1C
Hgb A1c MFr Bld: 5.6 % (ref <5.7)
Mean Plasma Glucose: 114 mg/dL (ref <117)

## 2014-02-01 MED ORDER — SODIUM CHLORIDE 0.9 % IV SOLN
12.5000 mg | Freq: Once | INTRAVENOUS | Status: AC
  Administered 2014-02-01: 12.5 mg via INTRAVENOUS
  Filled 2014-02-01: qty 0.5

## 2014-02-01 MED ORDER — CLOPIDOGREL BISULFATE 75 MG PO TABS
75.0000 mg | ORAL_TABLET | Freq: Every day | ORAL | Status: DC
  Administered 2014-02-01 – 2014-02-08 (×8): 75 mg via ORAL
  Filled 2014-02-01 (×9): qty 1

## 2014-02-01 MED ORDER — ACETAMINOPHEN 325 MG PO TABS
650.0000 mg | ORAL_TABLET | Freq: Four times a day (QID) | ORAL | Status: DC | PRN
  Administered 2014-02-01 – 2014-02-04 (×4): 650 mg via ORAL
  Filled 2014-02-01 (×4): qty 2

## 2014-02-01 MED ORDER — HYDRALAZINE HCL 20 MG/ML IJ SOLN: 10.0000 mg | Freq: Three times a day (TID) | INTRAMUSCULAR | Status: DC | PRN

## 2014-02-01 NOTE — Progress Notes (Signed)
OT Cancellation Note  Patient Details Name: [name] MRN: [id number] DOB: 09-10-1933   Cancelled Treatment:    Reason Eval/Treat Not Completed: Patient at procedure or test/ unavailable  Benito Mccreedy OTR/L [phone]  02/01/2014, 11:11 AM

## 2014-02-01 NOTE — Progress Notes (Signed)
*  PRELIMINARY RESULTS* Echocardiogram 2D Echocardiogram has been performed.  Leavy Cella 02/01/2014, 12:11 PM

## 2014-02-01 NOTE — Evaluation (Signed)
Physical Therapy Evaluation Patient Details Name: [name] MRN: [id number] DOB: [birth year] Today's Date: 02/01/2014   History of Present Illness  81 y.o. WF PMHx hypertension, hyperlipidemia, dementia, nontraumatic cerebral hemorrhage and ureterocele. Presented with new onset vertigo with nausea and vomiting as well as unstable gait. 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  Clinical Impression  Patient demonstrates deficits in functional mobility as indicated below. Will need continued skilled PT to address deficits and maximize function. Will see as indicated and progress as tolerated.  OF NOTE: Family significantly concerned re: patient osteoporosis and fall risk. Feel patient may benefit from CIR to maximize functional mobility, learn compensatory strategies for mobility with cerebellar impact and educate patient and family on safe handling techniques in preparation for dc home.     Follow Up Recommendations CIR;Supervision/Assistance - 24 hour    Equipment Recommendations  None recommended by PT    Recommendations for Other Services Rehab consult     Precautions / Restrictions Precautions Precautions: Fall Restrictions Weight Bearing Restrictions: No      Mobility  Bed Mobility Overal bed mobility: Needs Assistance Bed Mobility: Rolling;Sidelying to Sit Rolling: Min assist Sidelying to sit: Min assist       General bed mobility comments: VCs for technique (re: compression fracture of back, educated on precautions for comfort), Assist to elevate trunk to upright, increased time to perform, nauseated with positional change  Transfers Overall transfer level: Needs assistance Equipment used: 2 person hand held assist Transfers: Sit to/from Stand Sit to Stand: Min assist         General transfer comment: Vcs for hand placement, patient able to elevate to standing with minimal assist for stability. continues to endorse  nausea  Ambulation/Gait Ambulation/Gait assistance: Min assist Ambulation Distance (Feet): 10 Feet Assistive device: 2 person hand held assist Gait Pattern/deviations: Step-to pattern;Decreased stride length;Shuffle;Trunk flexed Gait velocity: decreased Gait velocity interpretation: <1.8 ft/sec, indicative of risk for recurrent falls General Gait Details: limited by nausea, unable to determine normal gait pattern or possible ataxic coordination with gait due to limited overall mobility re: nausea  Stairs            Wheelchair Mobility    Modified Rankin (Stroke Patients Only)       Balance Overall balance assessment: Needs assistance;History of Falls         Standing balance support: During functional activity Standing balance-Leahy Scale: Poor                               Pertinent Vitals/Pain Pain Assessment: No/denies pain (+ nausea)    Home Living Family/patient expects to be discharged to:: Private residence Living Arrangements: Children Available Help at Discharge: Family Type of Home: House       Home Layout: Able to live on main level with bedroom/bathroom Home Equipment: Environmental consultant - 2 wheels      Prior Function Level of Independence: Needs assistance   Gait / Transfers Assistance Needed: ambulated with RW, had assist for bed mobility secondary to recent compression fracture in spine           Hand Dominance   Dominant Hand: Right    Extremity/Trunk Assessment   Upper Extremity Assessment: Generalized weakness           Lower Extremity Assessment: Generalized weakness      Cervical / Trunk Assessment: Kyphotic  Communication      Cognition Arousal/Alertness: Lethargic (  from anti nausea medications per family) Behavior During Therapy: Flat affect Overall Cognitive Status: History of cognitive impairments - at baseline                      General Comments General comments (skin integrity, edema, etc.):  extensive time speaking with patient and family regarding nausea and vertigo like symptoms related to PICA infarct.     Exercises        Assessment/Plan    PT Assessment Patient needs continued PT services  PT Diagnosis Difficulty walking;Generalized weakness   PT Problem List Decreased strength;Decreased activity tolerance;Decreased balance;Decreased mobility;Decreased coordination;Pain  PT Treatment Interventions DME instruction;Gait training;Functional mobility training;Therapeutic activities;Therapeutic exercise;Balance training;Patient/family education   PT Goals (Current goals can be found in the Care Plan section) Acute Rehab PT Goals Patient Stated Goal: to not be nauseated PT Goal Formulation: With patient/family Time For Goal Achievement: 02/15/14 Potential to Achieve Goals: Good    Frequency Min 3X/week   Barriers to discharge        Co-evaluation               End of Session Equipment Utilized During Treatment: Gait belt Activity Tolerance: Patient limited by fatigue;Treatment limited secondary to medical complications (Comment) (nausea) Patient left: in chair;with call bell/phone within reach;with family/visitor present Nurse Communication: Mobility status         Time: 1406-1430 PT Time Calculation (min) (ACUTE ONLY): 24 min   Charges:   PT Evaluation $Initial PT Evaluation Tier I: 1 Procedure PT Treatments $Therapeutic Activity: 8-22 mins   PT G CodesDuncan Dull 02-27-14, 7:13 PM Alben Deeds, [place] DPT  [phone]

## 2014-02-01 NOTE — Plan of Care (Signed)
Problem: Consults Goal: Ischemic Stroke Patient Education See Patient Education Module for education specifics.  Outcome: Progressing Stroke book given to patient, family and patient educated in regards to what a ischemic stroke is and how it can affect the body.

## 2014-02-01 NOTE — Progress Notes (Signed)
[place] TEAM 1 - Stepdown/ICU TEAM Progress Note  [name] WFU:[id number] DOB: 1933/11/05 DOA: 01/31/2014 PCP: Abigail Miyamoto, MD  Admit HPI / Brief Narrative: [name] is an 81 y.o. WF PMHx hypertension, hyperlipidemia, dementia, nontraumatic cerebral hemorrhage and ureterocele. Presented with new onset vertigo with nausea and vomiting as well as unstable gait. Patient was last known well at 10 PM last night 01/30/2014 (LKW). She noticed symptoms when she attempted to sit on the side of her bed at 4 AM. She's had no change in speech. No focal weakness has been noted. She said no visual changes. She's been taking aspirin daily. CT scan of her head showed nonhemorrhagic infarction involving a large portion of the left cerebral hemisphere inferiorly as well as a small portion of the right cerebellar hemisphere inferomedially. There was slight mass effect on fourth ventricle. NIH stroke score was 0. Patient was not administered TPA secondary to beyond time window for treatment consideration. She was admitted for further evaluation and treatment.    HPI/Subjective: 1/26 A/O 4, complains of constant nausea not relieved with medication. Negative CP, negative SOB, negative headache.  Assessment/Plan: Acute CVA; Acute LEFT posterior inferior cerebellar artery territory infarct  -Will await PT/OT recommendations for CIR vs SNF -Allow permissive HTN secondary to new CVA -See HTN -Stroke team on board; will await further recommendations  Hypertension -Continue hydralazine 10 mg  TID PRN SBP > 190  Hyperlipidemia -Lipid panel within AHA guidelines - Continue Lipitor 80 mg daily   Other Stroke Risk Factors -Advanced age Hx stroke/TIA - hx small L cerebellar infarct 2011; - progression L frontal micro hemorrhages since 2009 (? Amyloid) -Family hx stroke (mother)  Refractory nausea -Patient has had multiple doses of Zofran without decreased in symptoms of nausea. -Thorazine 12.5  mg 1; may increase to 25 mg 1 in 6 hours if does not drop patient's BP.    Code Status: FULL Family Communication: family present at time of exam Disposition Plan: CIR vs SNF     Consultants: Dr.Pramod Sethi (neurology stroke team)    Procedure/Significant Events: 1/25 CT head without contrast;- wedge-shaped low-attenuation involving the inferior left cerebellar hemisphere.- smaller focus of low attenuation in the medial inferior right cerebellar hemisphere.  -likely represent subacute nonhemorrhagic infarctions.  -mild to moderate chronic microvascular ischemic change in the deep white matter. 1/26 MRA/MRI brain;MRA HEAD FINDINGS - Patent anterior communicating artery.  -No large vessel occlusion, high-grade stenosis, aneurysm. -Posterior circulation: Mid grade stenosis of RIGHT P 2 segment, with compensatory diminutive RIGHT P1 segment, robust  -No large vessel occlusion, high-grade stenosis, aneurysm.  MRI HEAD:- Acute LEFT posterior inferior cerebellar artery territory infarct with petechial hemorrhage. -Scattered foci of susceptibility artifact in a pattern suggesting sequelae of chronic hypertension. -Thready irregular LEFT posterior-inferior cerebellar artery, this could reflect atherosclerosis or mass effect from acute infarct. 1/26 carotid duplex; Bilateral carotid artery duplex completed: 1-39% ICA stenosis. Vertebral artery flow is antegrade.  1/26 echocardiogram;- Left ventricle: The cavity size was normal. Wall thickness was normal. Systolic function was normal. The estimated ejection fraction was in the range of 55% to 60%. Wall motion was normal; there were no regional wall motion abnormalities. Doppler parameters are consistent with abnormal left ventricular relaxation (grade 1 diastolic dysfunction).   Culture NA  Antibiotics: NA  DVT prophylaxis: SCD   Devices NA   LINES / TUBES:      Continuous Infusions: . sodium chloride 75  mL/hr at 02/01/14 0942    Objective: VITAL SIGNS:  Temp: 98.2 F (36.8 C) (01/26 1100) Temp Source: Oral (01/26 1100) BP: 138/56 mmHg (01/26 0800) Pulse Rate: 73 (01/26 0800) SPO2; FIO2:   Intake/Output Summary (Last 24 hours) at 02/01/14 1442 Last data filed at 02/01/14 0700  Gross per 24 hour  Intake 943.75 ml  Output    850 ml  Net  93.75 ml     Exam: General: A/O 4, moderate distress secondary to refractory nausea, No acute respiratory distress Lungs: Clear to auscultation bilaterally without wheezes or crackles Cardiovascular: Regular rate and rhythm without murmur gallop or rub normal S1 and S2 Abdomen: Nontender, nondistended, soft, bowel sounds positive, no rebound, no ascites, no appreciable mass Extremities: No significant cyanosis, clubbing, or edema bilateral lower extremities Neurologic; cranial nerves II through XII intact, tongue/uvula midline, extremity strength 5/5, extremity sensation intact throughout, finger nose finger within normal limits bilateral, quick finger touch bilateral within normal limits, did not ambulate patient.    Data Reviewed: Basic Metabolic Panel:  Recent Labs Lab 01/31/14 1604  NA 135  K 4.2  CL 103  CO2 25  GLUCOSE 122*  BUN 8  CREATININE 0.92  CALCIUM 9.0   Liver Function Tests:  Recent Labs Lab 01/31/14 1604  AST 28  ALT 25  ALKPHOS 143*  BILITOT 0.6  PROT 6.6  ALBUMIN 3.6   No results for input(s): LIPASE, AMYLASE in the last 168 hours. No results for input(s): AMMONIA in the last 168 hours. CBC:  Recent Labs Lab 01/31/14 1604  WBC 5.9  NEUTROABS 5.0  HGB 12.6  HCT 36.2  MCV 93.3  PLT 281   Cardiac Enzymes: No results for input(s): CKTOTAL, CKMB, CKMBINDEX, TROPONINI in the last 168 hours. BNP (last 3 results) No results for input(s): PROBNP in the last 8760 hours. CBG: No results for input(s): GLUCAP in the last 168 hours.  Recent Results (from the past 240 hour(s))  MRSA PCR Screening      Status: None   Collection Time: 01/31/14  8:04 PM  Result Value Ref Range Status   MRSA by PCR NEGATIVE NEGATIVE Final    Comment:        The GeneXpert MRSA Assay (FDA approved for NASAL specimens only), is one component of a comprehensive MRSA colonization surveillance program. It is not intended to diagnose MRSA infection nor to guide or monitor treatment for MRSA infections.      Studies:  Recent x-ray studies have been reviewed in detail by the Attending Physician  Scheduled Meds:  Scheduled Meds: . atorvastatin  80 mg Oral q morning - 10a  . cholecalciferol  1,000 Units Oral BID  . clopidogrel  75 mg Oral Daily  . enoxaparin (LOVENOX) injection  40 mg Subcutaneous Q24H  . multivitamin with minerals  1 tablet Oral Daily  . pantoprazole  40 mg Oral Daily  . pneumococcal 23 valent vaccine  0.5 mL Intramuscular Tomorrow-1000  . vitamin B-12  1,000 mcg Oral Daily    Time spent on care of this patient: 40 mins   Allie Bossier [facility]  [facility] Office  [phone] Pager - [phone]  On-Call/Text Page:      [email or web address]      password [email or web address]  If 7PM-7AM, please contact night-coverage [email or web address] Password [email or web address] 02/01/2014, 2:42 PM   LOS: 1 day   Care during the described time interval was provided by me . I have reviewed this patient's available data, including medical history, events of note, physical examination, radiology studies and test results as  part of my evaluation  Dia Crawford, MD [phone] Pager

## 2014-02-01 NOTE — Progress Notes (Signed)
SLP Cancellation Note  Patient Details Name: [name] MRN: [id number] DOB: 08-Jul-1933   Cancelled treatment:       Reason Eval/Treat Not Completed: SLP screened, no needs identified, will sign off   [name] 02/01/2014, 9:37 AM

## 2014-02-01 NOTE — Progress Notes (Signed)
Utilization review completed.  

## 2014-02-01 NOTE — Progress Notes (Signed)
STROKE TEAM PROGRESS NOTE   HISTORY [name] is an 81 y.o. female history of hypertension, hyperlipidemia, dementia, nontraumatic cerebral hemorrhage and ureterocele, presenting with new onset vertigo with nausea and vomiting as well as unstable gait. Patient was last known well at 10 PM last night 01/30/2014 ([facility]). She noticed symptoms when she attempted to sit on the side of her bed at 4 AM. She's had no change in speech. No focal weakness has been noted. She said no visual changes. She's been taking aspirin daily. CT scan of her head showed nonhemorrhagic infarction involving a large portion of the left cerebral hemisphere inferiorly as well as a small portion of the right cerebellar hemisphere inferomedially. There was slight mass effect on fourth ventricle. NIH stroke score was 0. Patient was not administered TPA secondary to beyond time window for treatment consideration. She was admitted for further evaluation and treatment.   SUBJECTIVE (INTERVAL HISTORY) Her daughter and sister are at the bedside.  Her daughter is very informed about her medical history and condition.   OBJECTIVE Temp:  [98.2 F (36.8 C)-98.9 F (37.2 C)] 98.7 F (37.1 C) (01/26 0700) Pulse Rate:  [71-85] 73 (01/26 0800) Cardiac Rhythm:  [-] Normal sinus rhythm (01/26 0800) Resp:  [11-20] 11 (01/26 0800) BP: (130-152)/(55-72) 138/56 mmHg (01/26 0800) SpO2:  [95 %-100 %] 95 % (01/26 0800) Weight:  [56.9 kg (125 lb 7.1 oz)-65.772 kg (145 lb)] 56.9 kg (125 lb 7.1 oz) (01/25 1932)  No results for input(s): GLUCAP in the last 168 hours.  Recent Labs Lab 01/31/14 1604  NA 135  K 4.2  CL 103  CO2 25  GLUCOSE 122*  BUN 8  CREATININE 0.92  CALCIUM 9.0    Recent Labs Lab 01/31/14 1604  AST 28  ALT 25  ALKPHOS 143*  BILITOT 0.6  PROT 6.6  ALBUMIN 3.6    Recent Labs Lab 01/31/14 1604  WBC 5.9  NEUTROABS 5.0  HGB 12.6  HCT 36.2  MCV 93.3  PLT 281   No results for input(s): CKTOTAL, CKMB,  CKMBINDEX, TROPONINI in the last 168 hours. No results for input(s): LABPROT, INR in the last 72 hours.  Recent Labs  01/31/14 2357  COLORURINE YELLOW  LABSPEC 1.012  PHURINE 8.0  GLUCOSEU NEGATIVE  HGBUR NEGATIVE  BILIRUBINUR NEGATIVE  KETONESUR 15*  PROTEINUR NEGATIVE  UROBILINOGEN 0.2  NITRITE NEGATIVE  LEUKOCYTESUR TRACE*       Component Value Date/Time   CHOL 126 02/01/2014 0310   TRIG 46 02/01/2014 0310   HDL 46 02/01/2014 0310   CHOLHDL 2.7 02/01/2014 0310   VLDL 9 02/01/2014 0310   LDLCALC 71 02/01/2014 0310   No results found for: HGBA1C No results found for: LABOPIA, COCAINSCRNUR, LABBENZ, AMPHETMU, THCU, LABBARB  No results for input(s): ETH in the last 168 hours.  Dg Chest 2 View  01/31/2014   CLINICAL DATA:  Nausea.  Dizziness.  Initial encounter.  EXAM: CHEST  2 VIEW  COMPARISON:  01/25/2010.  FINDINGS: Lung volumes are lower than on prior. There is opacity over the lower lobes on the lateral view which is most compatible with atelectasis based on the lung volumes. Pneumonia is considered less likely.  In the interval since the prior exam from 2012, mid thoracic compression fracture has developed with about 75% maximal loss of vertebral body height. This appears at about the T7 level  IMPRESSION: No acute cardiopulmonary disease. Lower lung volumes than on prior with basilar atelectasis.   Electronically Signed  By: Dereck Ligas M.D.   On: 01/31/2014 16:37   Ct Head Wo Contrast  01/31/2014   CLINICAL DATA:  Worsening lightheadedness.  EXAM: CT HEAD WITHOUT CONTRAST  TECHNIQUE: Contiguous axial images were obtained from the base of the skull through the vertex without intravenous contrast.  COMPARISON:  01/16/2014  FINDINGS: There is wedge-shaped low-attenuation involving the inferior left cerebellar hemisphere, new from 01/16/2014. There also is a smaller focus of low attenuation in the medial inferior right cerebellar hemisphere. These posterior fossa  abnormalities likely represent subacute nonhemorrhagic infarctions. There is minimal mass effect on the fourth ventricle. No other subacute infarctions are evident. There is moderate generalized atrophy. There is mild to moderate chronic microvascular ischemic change in the deep white matter. There is no intracranial hemorrhage. There is no mass.  There is a right sphenoid sinus air-fluid level. There is retained secretion within the left sphenoid sinus.  IMPRESSION: Subacute nonhemorrhagic infarction involving a large portion of the left cerebellar hemisphere inferiorly as well as a small portion of the right cerebellar hemisphere inferomedially. Minimal mass effect on the fourth ventricle.  Moderate atrophy and microvascular disease in the cerebral hemispheres.  Sphenoid sinus disease bilaterally.   Electronically Signed   By: Andreas Newport M.D.   On: 01/31/2014 16:48   Mr Brain Wo Contrast  02/01/2014   CLINICAL DATA:  Chronic intermittent dizziness for 1-2 years, acute onset vertigo at 4 a.m. Difficulty angulation with recent spinal fractures. New onset severe vertigo, nausea, ataxia.  EXAM: MRI HEAD WITHOUT CONTRAST  MRA HEAD WITHOUT CONTRAST  TECHNIQUE: Multiplanar, multiecho pulse sequences of the brain and surrounding structures were obtained without intravenous contrast. Angiographic images of the head were obtained using MRA technique without contrast.  COMPARISON:  CT of the head January 31, 2014  FINDINGS: MRI HEAD FINDINGS  Reduced diffusion and LEFT inferior cerebellum, corresponding low ADC values and mild FLAIR hyperintense signal, slight effacement of the associated cerebellar folia without fourth ventricle effacement. Minimal susceptibility artifact within LEFT inferior cerebellum suggest petechial hemorrhage. Punctate focus of susceptibility artifact in the periphery of the supra and infratentorial brain.  Ventricles and sulci are overall normal for patient's age. No midline shift. No  abnormal extra-axial fluid collections.  Ocular globes and orbital contents are unremarkable. Layering air-fluid levels in the sphenoid sinuses. The mastoid air cells are well aerated. Patient is edentulous.  MRA HEAD FINDINGS  Anterior circulation: Flow related enhancement of the included cervical, petrous, cavernous and supra clinoid internal carotid arteries. Mild luminal irregularity of the RIGHT greater than LEFT cavernous carotid artery, with associated calcific atherosclerosis on yesterday's CT. Patent anterior communicating artery. Normal flow related enhancement of the anterior and middle cerebral arteries, including more distal segments. Mildly dolichoectatic  No large vessel occlusion, high-grade stenosis, aneurysm.  Posterior circulation: LEFT vertebral artery is dominant. Thready flow related enhancement of LEFT posterior inferior cerebellar artery. Basilar artery is patent, with normal flow related enhancement of the main branch vessels. Flow related enhancement of the posterior cerebral arteries. Mid grade stenosis of RIGHT P 2 segment, with compensatory diminutive RIGHT P1 segment, robust RIGHT posterior communicating artery. Luminal irregularity of the bilateral posterior cerebral arteries.  No large vessel occlusion, high-grade stenosis, aneurysm.  IMPRESSION: MRI HEAD: Acute LEFT posterior inferior cerebellar artery territory infarct with petechial hemorrhage.  Scattered foci of susceptibility artifact in a pattern suggesting sequelae of chronic hypertension.  Acute sphenoid sinusitis.  MRA HEAD: Thready irregular LEFT posterior-inferior cerebellar artery, this could reflect atherosclerosis or  mass effect from acute infarct.  Mid grade stenosis of RIGHT P2 segment with luminal irregularity of the bilateral posterior cerebral arteries consistent with atherosclerosis.   Electronically Signed   By: Elon Alas   On: 02/01/2014 03:39   Mr Jodene Nam Head/brain Wo Cm  02/01/2014   CLINICAL DATA:   Chronic intermittent dizziness for 1-2 years, acute onset vertigo at 4 a.m. Difficulty angulation with recent spinal fractures. New onset severe vertigo, nausea, ataxia.  EXAM: MRI HEAD WITHOUT CONTRAST  MRA HEAD WITHOUT CONTRAST  TECHNIQUE: Multiplanar, multiecho pulse sequences of the brain and surrounding structures were obtained without intravenous contrast. Angiographic images of the head were obtained using MRA technique without contrast.  COMPARISON:  CT of the head January 31, 2014  FINDINGS: MRI HEAD FINDINGS  Reduced diffusion and LEFT inferior cerebellum, corresponding low ADC values and mild FLAIR hyperintense signal, slight effacement of the associated cerebellar folia without fourth ventricle effacement. Minimal susceptibility artifact within LEFT inferior cerebellum suggest petechial hemorrhage. Punctate focus of susceptibility artifact in the periphery of the supra and infratentorial brain.  Ventricles and sulci are overall normal for patient's age. No midline shift. No abnormal extra-axial fluid collections.  Ocular globes and orbital contents are unremarkable. Layering air-fluid levels in the sphenoid sinuses. The mastoid air cells are well aerated. Patient is edentulous.  MRA HEAD FINDINGS  Anterior circulation: Flow related enhancement of the included cervical, petrous, cavernous and supra clinoid internal carotid arteries. Mild luminal irregularity of the RIGHT greater than LEFT cavernous carotid artery, with associated calcific atherosclerosis on yesterday's CT. Patent anterior communicating artery. Normal flow related enhancement of the anterior and middle cerebral arteries, including more distal segments. Mildly dolichoectatic  No large vessel occlusion, high-grade stenosis, aneurysm.  Posterior circulation: LEFT vertebral artery is dominant. Thready flow related enhancement of LEFT posterior inferior cerebellar artery. Basilar artery is patent, with normal flow related enhancement of the  main branch vessels. Flow related enhancement of the posterior cerebral arteries. Mid grade stenosis of RIGHT P 2 segment, with compensatory diminutive RIGHT P1 segment, robust RIGHT posterior communicating artery. Luminal irregularity of the bilateral posterior cerebral arteries.  No large vessel occlusion, high-grade stenosis, aneurysm.  IMPRESSION: MRI HEAD: Acute LEFT posterior inferior cerebellar artery territory infarct with petechial hemorrhage.  Scattered foci of susceptibility artifact in a pattern suggesting sequelae of chronic hypertension.  Acute sphenoid sinusitis.  MRA HEAD: Thready irregular LEFT posterior-inferior cerebellar artery, this could reflect atherosclerosis or mass effect from acute infarct.  Mid grade stenosis of RIGHT P2 segment with luminal irregularity of the bilateral posterior cerebral arteries consistent with atherosclerosis.   Electronically Signed   By: Elon Alas   On: 02/01/2014 03:39   Carotid Doppler  There is 1-39% bilateral ICA stenosis. Vertebral artery flow is antegrade.     PHYSICAL EXAM pleasant elderly caucasian lady sitting up comfortably in bed.Awake alert. Afebrile. Head is nontraumatic. Neck is supple without bruit. Hearing is normal. Cardiac exam no murmur or gallop. Lungs are clear to auscultation. Distal pulses are well felt. Neurological Exam ; ;  Awake  Alert oriented x 3. Normal speech and language.eye movements full without nystagmus.fundi were not visualized. Vision acuity and fields appear normal. Hearing is normal. Palatal movements are normal. Face symmetric. Tongue midline. Normal strength, tone, reflexes and coordination. Normal sensation. Gait deferred. ASSESSMENT/PLAN Ms. [name] is a 81 y.o. female with history of hypertension, hyperlipidemia, dementia, nontraumatic cerebral hemorrhage and ureterocele presenting with new onset nausea and vomiting as well as  dizziness and unstable gait. She did not receive IV t-PA due to delay  in arrival.   Stroke:  left PICA infarct with petechial hemorrhage c/w chronic hypertension, felt to be embolic (? Afib), at risk for neuro worsening given subtentorial location  Resultant  ataxia  MRI  L PICA infarct, L sphenoid sinusitis  MRA  Thready L PICA, R P2 high grade stenosis  Carotid Doppler  No significant stenosis   2D Echo  pending   HgbA1c pending  Lovenox 40 mg sq daily for VTE prophylaxis  Diet Heart thin liquids  aspirin 81 mg orally every day prior to admission, now on clopidogrel 75 mg orally every day  Ongoing aggressive stroke risk factor management  No further embolic workup as pt not an anticoagulation candidate due to fall risk, dementia  Repeat CT head in am  Keep in step down for frequent neuro monitoring  Therapy recommendations:  pending   Disposition:  Pending.   Hypertension  Stable  Hyperlipidemia  Home meds:  lipitor 80 resumed in hospital  LDL 71, goal < 70  Continue statin at discharge  Other Stroke Risk Factors  Advanced age  Hx stroke/TIA - hx small L cerebellar infarct 2011; progression L frontal micro hemorrhages since 2009 (? Amyloid)  Family hx stroke (mother)  Other Active Problems  Baseline dementia - ? amyloid  Collapsed vertebrae, requires help with ADLs, recently staying with daughter who helps provide care  Hospital day # [place] for Pager information 02/01/2014 1:43 PM   I have personally examined this patient, reviewed notes, independently viewed imaging studies, participated in medical decision making and plan of care. I have made any additions or clarifications directly to the above note. Agree with note above. I had a long discussion the patient and her daughter that she is at risk of significant cerebral edema, and neurological worsening, hydrocephalus and needs close neurological monitoring for the next 1-2 days. She has only a few microhemorrhages on a  previous MRI scan from April 2011 which I have personally reviewed hence I think changing aspirin to Plavix would be appropriate. We will not initiate workup for source of embolism as she is not a good long-term candidate for anticoagulation given her history of fall risk  Antony Contras, MD Medical Director [facility] Pager: [phone] 02/01/2014 1:54 PM  To contact Stroke Continuity provider, please refer to [email or web address] After hours, contact General Neurology

## 2014-02-01 NOTE — Progress Notes (Signed)
Bilateral carotid artery duplex completed:  1-39% ICA stenosis.  Vertebral artery flow is antegrade.     

## 2014-02-02 ENCOUNTER — Inpatient Hospital Stay (HOSPITAL_COMMUNITY): Payer: Medicare Other

## 2014-02-02 DIAGNOSIS — G8929 Other chronic pain: Secondary | ICD-10-CM

## 2014-02-02 DIAGNOSIS — M545 Low back pain: Secondary | ICD-10-CM

## 2014-02-02 DIAGNOSIS — I639 Cerebral infarction, unspecified: Secondary | ICD-10-CM | POA: Diagnosis present

## 2014-02-02 LAB — CBC WITH DIFFERENTIAL/PLATELET
Basophils Absolute: 0 10*3/uL (ref 0.0–0.1)
Basophils Relative: 0 % (ref 0–1)
Eosinophils Absolute: 0 10*3/uL (ref 0.0–0.7)
Eosinophils Relative: 0 % (ref 0–5)
HEMATOCRIT: 34.6 % — AB (ref 36.0–46.0)
Hemoglobin: 12.2 g/dL (ref 12.0–15.0)
Lymphocytes Relative: 9 % — ABNORMAL LOW (ref 12–46)
Lymphs Abs: 0.9 10*3/uL (ref 0.7–4.0)
MCH: 31.9 pg (ref 26.0–34.0)
MCHC: 35.3 g/dL (ref 30.0–36.0)
MCV: 90.6 fL (ref 78.0–100.0)
MONO ABS: 0.5 10*3/uL (ref 0.1–1.0)
Monocytes Relative: 5 % (ref 3–12)
NEUTROS ABS: 8.6 10*3/uL — AB (ref 1.7–7.7)
Neutrophils Relative %: 86 % — ABNORMAL HIGH (ref 43–77)
Platelets: 234 10*3/uL (ref 150–400)
RBC: 3.82 MIL/uL — ABNORMAL LOW (ref 3.87–5.11)
RDW: 11.7 % (ref 11.5–15.5)
WBC: 10 10*3/uL (ref 4.0–10.5)

## 2014-02-02 LAB — COMPREHENSIVE METABOLIC PANEL
ALK PHOS: 127 U/L — AB (ref 39–117)
ALT: 20 U/L (ref 0–35)
ANION GAP: 9 (ref 5–15)
AST: 26 U/L (ref 0–37)
Albumin: 3.3 g/dL — ABNORMAL LOW (ref 3.5–5.2)
BILIRUBIN TOTAL: 1 mg/dL (ref 0.3–1.2)
BUN: 8 mg/dL (ref 6–23)
CHLORIDE: 99 mmol/L (ref 96–112)
CO2: 22 mmol/L (ref 19–32)
Calcium: 8.5 mg/dL (ref 8.4–10.5)
Creatinine, Ser: 0.93 mg/dL (ref 0.50–1.10)
GFR calc Af Amer: 66 mL/min — ABNORMAL LOW (ref >90)
GFR, EST NON AFRICAN AMERICAN: 57 mL/min — AB (ref >90)
Glucose, Bld: 109 mg/dL — ABNORMAL HIGH (ref 70–99)
Potassium: 3.9 mmol/L (ref 3.5–5.1)
Sodium: 130 mmol/L — ABNORMAL LOW (ref 135–145)
Total Protein: 6 g/dL (ref 6.0–8.3)

## 2014-02-02 LAB — MAGNESIUM: Magnesium: 1.7 mg/dL (ref 1.5–2.5)

## 2014-02-02 MED ORDER — ONDANSETRON HCL 4 MG/2ML IJ SOLN
4.0000 mg | INTRAMUSCULAR | Status: DC | PRN
  Administered 2014-02-02 – 2014-02-09 (×9): 4 mg via INTRAVENOUS
  Filled 2014-02-02 (×9): qty 2

## 2014-02-02 MED ORDER — PROMETHAZINE HCL 25 MG/ML IJ SOLN
12.5000 mg | INTRAMUSCULAR | Status: DC | PRN
  Administered 2014-02-02: 12.5 mg via INTRAVENOUS
  Filled 2014-02-02: qty 1

## 2014-02-02 MED ORDER — SENNOSIDES-DOCUSATE SODIUM 8.6-50 MG PO TABS
1.0000 | ORAL_TABLET | Freq: Two times a day (BID) | ORAL | Status: DC
  Administered 2014-02-02 – 2014-02-07 (×9): 1 via ORAL
  Filled 2014-02-02 (×17): qty 1

## 2014-02-02 MED ORDER — BISACODYL 10 MG RE SUPP
10.0000 mg | Freq: Every day | RECTAL | Status: DC | PRN
  Administered 2014-02-02: 10 mg via RECTAL
  Filled 2014-02-02: qty 1

## 2014-02-02 MED ORDER — PROMETHAZINE HCL 25 MG/ML IJ SOLN
12.5000 mg | Freq: Once | INTRAMUSCULAR | Status: AC
  Administered 2014-02-02: 12.5 mg via INTRAVENOUS
  Filled 2014-02-02: qty 1

## 2014-02-02 NOTE — Progress Notes (Signed)
OT Cancellation Note  Patient Details Name: [name] MRN: [id number] DOB: 1933-04-08   Cancelled Treatment:    Reason Eval/Treat Not Completed: Medical issues which prohibited therapy. Communicated with RN who reports that pt has had increased nausea and vomiting today with possible worsening of stroke. OT will hold at this time and will re-attempt when appropriate.   Villa Herb M   Cyndie Chime, OTR/L Occupational Therapist [phone] (pager)  02/02/2014, 11:35 AM

## 2014-02-02 NOTE — Progress Notes (Signed)
[facility] TEAM 1 - Stepdown/ICU TEAM Progress Note  Bridgitte Felicetti VXB:[id number] DOB: 1933-05-28 DOA: 01/31/2014 PCP: Abigail Miyamoto, MD  Admit HPI / Brief Narrative: 81 y.o. F w/ Hx hypertension, hyperlipidemia, dementia, nontraumatic cerebral hemorrhage, and ureterocele who presented with new onset vertigo with nausea and vomiting as well as unstable gait. Patient was last known well at 10 PM 01/30/2014. She noticed symptoms when she attempted to sit on the side of her bed at 4 AM 01/31/14. She had no change in speech. No focal weakness noted. No visual changes. She'd been taking aspirin daily. CT scan of head showed nonhemorrhagic infarction involving a large portion of the left cerebral hemisphere inferiorly as well as a small portion of the right cerebellar hemisphere inferomedially. There was slight mass effect on fourth ventricle. NIH stroke score was 0. Patient was not administered TPA secondary to being beyond time window for treatment consideration.   HPI/Subjective: Pt has been dealing w/ ongoing nausea and vomiting today.  No new neurologic issues have been encountered.   Assessment/Plan:  Acute LEFT posterior inferior cerebellar artery territory CVA -CIR to eval  -permissive HTN secondary to new CVA -felt to be embolic (? Afib) - no further embolic workup as pt not an anticoagulation candidate due to fall risk, dementia -at risk for neuro worsening given subtentorial location - keep in SDU for now  -Stroke Team on board - they suggest changing ASA to Plavix   Refractory nausea -due to above - counseled family that med tx may offer little relief but will use prn   Hypertension -Continue hydralazine 10 mg TID PRN SBP > 190  Hyperlipidemia -Lipid panel within AHA guidelines -Continue Lipitor 80 mg daily   Mild hyponatremia  -Follow trend   Dementia   Code Status: FULL Family Communication: spoke w/ daughter at bedside  Disposition Plan: CIR vs SNF when medically  cleared   Consultants: Stroke Team  Procedure/Significant Events: 1/26 carotid duplex; Bilateral carotid artery duplex completed: 1-39% ICA stenosis. Vertebral artery flow is antegrade.  1/26 echocardiogram;- Left ventricle: The cavity size was normal. Wall thickness was normal. Systolic function was normal. The estimated ejection fraction was in the range of 55% to 60%. Wall motion was normal; there were no regional wall motion abnormalities. Doppler parameters are consistent with abnormal left ventricular relaxation (grade 1 diastolic dysfunction).  Antibiotics: NA  DVT prophylaxis: SCD  Objective: Blood pressure 141/63, pulse 77, temperature 99.1 F (37.3 C), temperature source Oral, resp. rate 14, height 5\' 4"  (1.626 m), weight 56.9 kg (125 lb 7.1 oz), SpO2 99 %.  Intake/Output Summary (Last 24 hours) at 02/02/14 1338 Last data filed at 02/02/14 1300  Gross per 24 hour  Intake   1620 ml  Output    475 ml  Net   1145 ml   Exam: General:No acute respiratory distress Lungs: Clear to auscultation bilaterally without wheezes or crackles Cardiovascular: Regular rate and rhythm without murmur gallop or rub normal S1 and S2 Abdomen: Nontender, nondistended, soft, bowel sounds positive, no rebound, no ascites, no appreciable mass Extremities: No significant cyanosis, clubbing, or edema bilateral lower extremities Neurologic; see Stroke Team eval   Data Reviewed: Basic Metabolic Panel:  Recent Labs Lab 01/31/14 1604 02/02/14 0339  NA 135 130*  K 4.2 3.9  CL 103 99  CO2 25 22  GLUCOSE 122* 109*  BUN 8 8  CREATININE 0.92 0.93  CALCIUM 9.0 8.5  MG  --  1.7   Liver Function Tests:  Recent Labs Lab 01/31/14 1604 02/02/14 0339  AST 28 26  ALT 25 20  ALKPHOS 143* 127*  BILITOT 0.6 1.0  PROT 6.6 6.0  ALBUMIN 3.6 3.3*   CBC:  Recent Labs Lab 01/31/14 1604 02/02/14 0339  WBC 5.9 10.0  NEUTROABS 5.0 8.6*  HGB 12.6 12.2  HCT 36.2 34.6*  MCV  93.3 90.6  PLT 281 234    Recent Results (from the past 240 hour(s))  MRSA PCR Screening     Status: None   Collection Time: 01/31/14  8:04 PM  Result Value Ref Range Status   MRSA by PCR NEGATIVE NEGATIVE Final    Comment:        The GeneXpert MRSA Assay (FDA approved for NASAL specimens only), is one component of a comprehensive MRSA colonization surveillance program. It is not intended to diagnose MRSA infection nor to guide or monitor treatment for MRSA infections.      Studies:  Recent x-ray studies have been reviewed in detail by the Attending Physician  Scheduled Meds:  Scheduled Meds: . atorvastatin  80 mg Oral q morning - 10a  . cholecalciferol  1,000 Units Oral BID  . clopidogrel  75 mg Oral Daily  . enoxaparin (LOVENOX) injection  40 mg Subcutaneous Q24H  . multivitamin with minerals  1 tablet Oral Daily  . pantoprazole  40 mg Oral Daily  . vitamin B-12  1,000 mcg Oral Daily    Time spent on care of this patient: 35 mins  Cherene Altes, MD [facility] For Consults/Admissions - Flow Manager - [phone] Office  [phone]  Contact MD directly via text page:      [email or web address]      password [facility]  02/02/2014, 1:38 PM   LOS: 2 days

## 2014-02-02 NOTE — Progress Notes (Signed)
PT Cancellation Note  Patient Details Name: Niomie Englert MRN: [id number] DOB: 14-Sep-1933   Cancelled Treatment:    Reason Eval/Treat Not Completed: Medical issues which prohibited therapy. Evolving LEFT posterior inferior cerebellar artery territory infarct with worsening edema, partial effacement of the fourth ventricle without obstructive hydrocephalus. Will await update and clearance from Neuro.   Duncan Dull 02/02/2014, 11:38 AM Alben Deeds, PT DPT  [phone]

## 2014-02-02 NOTE — Consult Note (Signed)
Physical Medicine and Rehabilitation Consult Reason for Consult:left PICA infarct Referring Physician: [facility]   HPI: [name] is a 81 y.o.right handed female with history of hypertension, dementia, osteoporosis, nontraumatic cerebral hemorrhage 2011.Marland Kitchen Patient lives with family used a rolling walker prior to admission. Presented 01/31/2014 with nausea vomiting as well as dizziness and unsteady gait. MRI showed acute left posterior inferior cerebellar artery territory infarct. MRA with mid stenosis right P2 segment with luminal irregularity of the bilat posterior cerebral arteries consistent with atherosclerosis. Echocardiogram with ejection fraction 01% grade 1 diastolic dysfunction. Carotid Doppler no ICA stenosis. Patient did not receive TPA. CT angiogram head and neck are pending. Neurology consulted presently on Plavix for CVA prophylaxis with the addition of Lovenox for DVT prophylaxis. Tolerating a regular diet. Physical therapy evaluation completed 02/01/2014 recommendations of physical medicine rehabilitation consult   Review of Systems  Gastrointestinal: Positive for nausea and vomiting.       GERD  Musculoskeletal: Positive for myalgias and falls.  Neurological: Positive for dizziness.  All other systems reviewed and are negative.  Past Medical History  Diagnosis Date  . Hypertension   . Hyperlipemia   . Dementia   . Cerebral hemorrhage, nontraumatic 2011  . CVA (cerebral infarction) 2011    left cerebellum  . Ureterocele, acquired    Past Surgical History  Procedure Laterality Date  . Abdominal hysterectomy    . Cholecystectomy     History reviewed. No pertinent family history. Social History:  reports that she has quit smoking. She does not have any smokeless tobacco history on file. She reports that she does not drink alcohol or use illicit drugs. Allergies:  Allergies  Allergen Reactions  . Penicillins     Reaction: unknown  . Latex Rash    Medications Prior to Admission  Medication Sig Dispense Refill  . amLODipine-olmesartan (AZOR) 5-40 MG per tablet Take 1 tablet by mouth daily.    Marland Kitchen aspirin EC 81 MG tablet Take 81 mg by mouth every morning.    Marland Kitchen atorvastatin (LIPITOR) 80 MG tablet Take 80 mg by mouth every morning.    . cholecalciferol (VITAMIN D) 1000 UNITS tablet Take 1,000 Units by mouth 2 (two) times daily.    . Multiple Vitamin (MULTIVITAMIN WITH MINERALS) TABS Take 1 tablet by mouth daily.    Marland Kitchen omeprazole (PRILOSEC) 40 MG capsule Take 40 mg by mouth every morning.    Marland Kitchen oxyCODONE-acetaminophen (PERCOCET/ROXICET) 5-325 MG per tablet Take 1 tablet by mouth every 6 (six) hours as needed for moderate pain or severe pain. 60 tablet 0  . vitamin B-12 (CYANOCOBALAMIN) 1000 MCG tablet Take 1,000 mcg by mouth daily.      Home: Home Living Family/patient expects to be discharged to:: Private residence Living Arrangements: Children Available Help at Discharge: Family Type of Home: [place]: Able to live on main level with bedroom/bathroom Home Equipment: Walker - 2 wheels  Functional History: Prior Function Level of Independence: Needs assistance Gait / Transfers Assistance Needed: ambulated with RW, had assist for bed mobility secondary to recent compression fracture in spine Functional Status:  Mobility: Bed Mobility Overal bed mobility: Needs Assistance Bed Mobility: Rolling, Sidelying to Sit Rolling: Min assist Sidelying to sit: Min assist General bed mobility comments: VCs for technique (re: compression fracture of back, educated on precautions for comfort), Assist to elevate trunk to upright, increased time to perform, nauseated with positional change Transfers Overall transfer level: Needs assistance Equipment used: 2 person hand held  assist Transfers: Sit to/from Stand Sit to Stand: Min assist General transfer comment: Vcs for hand placement, patient able to elevate to standing with minimal assist  for stability. continues to endorse nausea Ambulation/Gait Ambulation/Gait assistance: Min assist Ambulation Distance (Feet): 10 Feet Assistive device: 2 person hand held assist Gait Pattern/deviations: Step-to pattern, Decreased stride length, Shuffle, Trunk flexed Gait velocity: decreased Gait velocity interpretation: <1.8 ft/sec, indicative of risk for recurrent falls General Gait Details: limited by nausea, unable to determine normal gait pattern or possible ataxic coordination with gait due to limited overall mobility re: nausea    ADL:    Cognition: Cognition Overall Cognitive Status: History of cognitive impairments - at baseline Orientation Level: Oriented X4 Cognition Arousal/Alertness: Lethargic (from anti nausea medications per family) Behavior During Therapy: Flat affect Overall Cognitive Status: History of cognitive impairments - at baseline  Blood pressure 141/63, pulse 77, temperature 99.1 F (37.3 C), temperature source Oral, resp. rate 14, height 5\' 4"  (1.626 m), weight 56.9 kg (125 lb 7.1 oz), SpO2 99 %. Physical Exam  HENT:  Head: Normocephalic.  Eyes: EOM are normal.  Neck: Normal range of motion. Neck supple. No thyromegaly present.  Cardiovascular: Normal rate and regular rhythm.   Respiratory: Effort normal and breath sounds normal. No respiratory distress.  GI: Soft. Bowel sounds are normal. She exhibits no distension.  Neurological: She is alert.  She provides her name, age and date of birth and follow simple commands. She mostly Her eyes closed during exam due to nausea when she opened her eyes. Moves all 4's. Senses pain.    Results for orders placed or performed during the hospital encounter of 01/31/14 (from the past 24 hour(s))  Comprehensive metabolic panel     Status: Abnormal   Collection Time: 02/02/14  3:39 AM  Result Value Ref Range   Sodium 130 (L) 135 - 145 mmol/L   Potassium 3.9 3.5 - 5.1 mmol/L   Chloride 99 96 - 112 mmol/L   CO2 22  19 - 32 mmol/L   Glucose, Bld 109 (H) 70 - 99 mg/dL   BUN 8 6 - 23 mg/dL   Creatinine, Ser 0.93 0.50 - 1.10 mg/dL   Calcium 8.5 8.4 - 10.5 mg/dL   Total Protein 6.0 6.0 - 8.3 g/dL   Albumin 3.3 (L) 3.5 - 5.2 g/dL   AST 26 0 - 37 U/L   ALT 20 0 - 35 U/L   Alkaline Phosphatase 127 (H) 39 - 117 U/L   Total Bilirubin 1.0 0.3 - 1.2 mg/dL   GFR calc non Af Amer 57 (L) >90 mL/min   GFR calc Af Amer 66 (L) >90 mL/min   Anion gap 9 5 - 15  CBC with Differential/Platelet     Status: Abnormal   Collection Time: 02/02/14  3:39 AM  Result Value Ref Range   WBC 10.0 4.0 - 10.5 K/uL   RBC 3.82 (L) 3.87 - 5.11 MIL/uL   Hemoglobin 12.2 12.0 - 15.0 g/dL   HCT 34.6 (L) 36.0 - 46.0 %   MCV 90.6 78.0 - 100.0 fL   MCH 31.9 26.0 - 34.0 pg   MCHC 35.3 30.0 - 36.0 g/dL   RDW 11.7 11.5 - 15.5 %   Platelets 234 150 - 400 K/uL   Neutrophils Relative % 86 (H) 43 - 77 %   Neutro Abs 8.6 (H) 1.7 - 7.7 K/uL   Lymphocytes Relative 9 (L) 12 - 46 %   Lymphs Abs 0.9 0.7 - 4.0  K/uL   Monocytes Relative 5 3 - 12 %   Monocytes Absolute 0.5 0.1 - 1.0 K/uL   Eosinophils Relative 0 0 - 5 %   Eosinophils Absolute 0.0 0.0 - 0.7 K/uL   Basophils Relative 0 0 - 1 %   Basophils Absolute 0.0 0.0 - 0.1 K/uL  Magnesium     Status: None   Collection Time: 02/02/14  3:39 AM  Result Value Ref Range   Magnesium 1.7 1.5 - 2.5 mg/dL   Dg Chest 2 View  01/31/2014   CLINICAL DATA:  Nausea.  Dizziness.  Initial encounter.  EXAM: CHEST  2 VIEW  COMPARISON:  01/25/2010.  FINDINGS: Lung volumes are lower than on prior. There is opacity over the lower lobes on the lateral view which is most compatible with atelectasis based on the lung volumes. Pneumonia is considered less likely.  In the interval since the prior exam from 2012, mid thoracic compression fracture has developed with about 75% maximal loss of vertebral body height. This appears at about the T7 level  IMPRESSION: No acute cardiopulmonary disease. Lower lung volumes than on  prior with basilar atelectasis.   Electronically Signed   By: Dereck Ligas M.D.   On: 01/31/2014 16:37   Ct Head Wo Contrast  02/02/2014   CLINICAL DATA:  Followup stroke, continued dizziness, nausea and vomiting.  EXAM: CT HEAD WITHOUT CONTRAST  TECHNIQUE: Contiguous axial images were obtained from the base of the skull through the vertex without intravenous contrast.  COMPARISON:  MRI of the head February 01, 2014  FINDINGS: Wedge-like hypodensity in LEFT inferior frontal lobe corresponding to known infarct, there is increasing cerebellar edema, LEFT greater than RIGHT with partial effacement of the fourth ventricle. Symmetric densities within the foramen of Luschka, unchanged. Very mild effacement of LEFT quadrigeminal cistern. No intraparenchymal hemorrhage. No hydrocephalus. No supratentorial acute large vascular territory infarct. No abnormal extra-axial fluid collections.  Acute Mild sphenoid ethmoidal sinusitis. The mastoid air cells are well aerated. Mild temporomandibular osteoarthrosis. No skull fracture. Ocular globes and orbital contents are unremarkable.  IMPRESSION: Evolving LEFT posterior inferior cerebellar artery territory infarct with worsening edema, partial effacement of the fourth ventricle without obstructive hydrocephalus. No hemorrhagic conversion.   Electronically Signed   By: Elon Alas   On: 02/02/2014 05:39   Ct Head Wo Contrast  01/31/2014   CLINICAL DATA:  Worsening lightheadedness.  EXAM: CT HEAD WITHOUT CONTRAST  TECHNIQUE: Contiguous axial images were obtained from the base of the skull through the vertex without intravenous contrast.  COMPARISON:  01/16/2014  FINDINGS: There is wedge-shaped low-attenuation involving the inferior left cerebellar hemisphere, new from 01/16/2014. There also is a smaller focus of low attenuation in the medial inferior right cerebellar hemisphere. These posterior fossa abnormalities likely represent subacute nonhemorrhagic infarctions.  There is minimal mass effect on the fourth ventricle. No other subacute infarctions are evident. There is moderate generalized atrophy. There is mild to moderate chronic microvascular ischemic change in the deep white matter. There is no intracranial hemorrhage. There is no mass.  There is a right sphenoid sinus air-fluid level. There is retained secretion within the left sphenoid sinus.  IMPRESSION: Subacute nonhemorrhagic infarction involving a large portion of the left cerebellar hemisphere inferiorly as well as a small portion of the right cerebellar hemisphere inferomedially. Minimal mass effect on the fourth ventricle.  Moderate atrophy and microvascular disease in the cerebral hemispheres.  Sphenoid sinus disease bilaterally.   Electronically Signed   By: Andreas Newport  M.D.   On: 01/31/2014 16:48   Mr Brain Wo Contrast  02/01/2014   CLINICAL DATA:  Chronic intermittent dizziness for 1-2 years, acute onset vertigo at 4 a.m. Difficulty angulation with recent spinal fractures. New onset severe vertigo, nausea, ataxia.  EXAM: MRI HEAD WITHOUT CONTRAST  MRA HEAD WITHOUT CONTRAST  TECHNIQUE: Multiplanar, multiecho pulse sequences of the brain and surrounding structures were obtained without intravenous contrast. Angiographic images of the head were obtained using MRA technique without contrast.  COMPARISON:  CT of the head January 31, 2014  FINDINGS: MRI HEAD FINDINGS  Reduced diffusion and LEFT inferior cerebellum, corresponding low ADC values and mild FLAIR hyperintense signal, slight effacement of the associated cerebellar folia without fourth ventricle effacement. Minimal susceptibility artifact within LEFT inferior cerebellum suggest petechial hemorrhage. Punctate focus of susceptibility artifact in the periphery of the supra and infratentorial brain.  Ventricles and sulci are overall normal for patient's age. No midline shift. No abnormal extra-axial fluid collections.  Ocular globes and orbital  contents are unremarkable. Layering air-fluid levels in the sphenoid sinuses. The mastoid air cells are well aerated. Patient is edentulous.  MRA HEAD FINDINGS  Anterior circulation: Flow related enhancement of the included cervical, petrous, cavernous and supra clinoid internal carotid arteries. Mild luminal irregularity of the RIGHT greater than LEFT cavernous carotid artery, with associated calcific atherosclerosis on yesterday's CT. Patent anterior communicating artery. Normal flow related enhancement of the anterior and middle cerebral arteries, including more distal segments. Mildly dolichoectatic  No large vessel occlusion, high-grade stenosis, aneurysm.  Posterior circulation: LEFT vertebral artery is dominant. Thready flow related enhancement of LEFT posterior inferior cerebellar artery. Basilar artery is patent, with normal flow related enhancement of the main branch vessels. Flow related enhancement of the posterior cerebral arteries. Mid grade stenosis of RIGHT P 2 segment, with compensatory diminutive RIGHT P1 segment, robust RIGHT posterior communicating artery. Luminal irregularity of the bilateral posterior cerebral arteries.  No large vessel occlusion, high-grade stenosis, aneurysm.  IMPRESSION: MRI HEAD: Acute LEFT posterior inferior cerebellar artery territory infarct with petechial hemorrhage.  Scattered foci of susceptibility artifact in a pattern suggesting sequelae of chronic hypertension.  Acute sphenoid sinusitis.  MRA HEAD: Thready irregular LEFT posterior-inferior cerebellar artery, this could reflect atherosclerosis or mass effect from acute infarct.  Mid grade stenosis of RIGHT P2 segment with luminal irregularity of the bilateral posterior cerebral arteries consistent with atherosclerosis.   Electronically Signed   By: Elon Alas   On: 02/01/2014 03:39   Mr Jodene Nam Head/brain Wo Cm  02/01/2014   CLINICAL DATA:  Chronic intermittent dizziness for 1-2 years, acute onset vertigo at 4  a.m. Difficulty angulation with recent spinal fractures. New onset severe vertigo, nausea, ataxia.  EXAM: MRI HEAD WITHOUT CONTRAST  MRA HEAD WITHOUT CONTRAST  TECHNIQUE: Multiplanar, multiecho pulse sequences of the brain and surrounding structures were obtained without intravenous contrast. Angiographic images of the head were obtained using MRA technique without contrast.  COMPARISON:  CT of the head January 31, 2014  FINDINGS: MRI HEAD FINDINGS  Reduced diffusion and LEFT inferior cerebellum, corresponding low ADC values and mild FLAIR hyperintense signal, slight effacement of the associated cerebellar folia without fourth ventricle effacement. Minimal susceptibility artifact within LEFT inferior cerebellum suggest petechial hemorrhage. Punctate focus of susceptibility artifact in the periphery of the supra and infratentorial brain.  Ventricles and sulci are overall normal for patient's age. No midline shift. No abnormal extra-axial fluid collections.  Ocular globes and orbital contents are unremarkable. Layering air-fluid levels in  the sphenoid sinuses. The mastoid air cells are well aerated. Patient is edentulous.  MRA HEAD FINDINGS  Anterior circulation: Flow related enhancement of the included cervical, petrous, cavernous and supra clinoid internal carotid arteries. Mild luminal irregularity of the RIGHT greater than LEFT cavernous carotid artery, with associated calcific atherosclerosis on yesterday's CT. Patent anterior communicating artery. Normal flow related enhancement of the anterior and middle cerebral arteries, including more distal segments. Mildly dolichoectatic  No large vessel occlusion, high-grade stenosis, aneurysm.  Posterior circulation: LEFT vertebral artery is dominant. Thready flow related enhancement of LEFT posterior inferior cerebellar artery. Basilar artery is patent, with normal flow related enhancement of the main branch vessels. Flow related enhancement of the posterior cerebral  arteries. Mid grade stenosis of RIGHT P 2 segment, with compensatory diminutive RIGHT P1 segment, robust RIGHT posterior communicating artery. Luminal irregularity of the bilateral posterior cerebral arteries.  No large vessel occlusion, high-grade stenosis, aneurysm.  IMPRESSION: MRI HEAD: Acute LEFT posterior inferior cerebellar artery territory infarct with petechial hemorrhage.  Scattered foci of susceptibility artifact in a pattern suggesting sequelae of chronic hypertension.  Acute sphenoid sinusitis.  MRA HEAD: Thready irregular LEFT posterior-inferior cerebellar artery, this could reflect atherosclerosis or mass effect from acute infarct.  Mid grade stenosis of RIGHT P2 segment with luminal irregularity of the bilateral posterior cerebral arteries consistent with atherosclerosis.   Electronically Signed   By: Elon Alas   On: 02/01/2014 03:39    Assessment/Plan: Diagnosis: left PICA infarct 1. Does the need for close, 24 hr/day medical supervision in concert with the patient's rehab needs make it unreasonable for this patient to be served in a less intensive setting? Yes 2. Co-Morbidities requiring supervision/potential complications: htn, chronic low back pain 3. Due to bladder management, bowel management, safety, skin/wound care, disease management, medication administration, pain management and patient education, does the patient require 24 hr/day rehab nursing? Yes 4. Does the patient require coordinated care of a physician, rehab nurse, PT (1-2 hrs/day, 5 days/week) and OT (1-2 hrs/day, 5 days/week) to address physical and functional deficits in the context of the above medical diagnosis(es)? Yes Addressing deficits in the following areas: balance, endurance, locomotion, strength, transferring, bowel/bladder control, bathing, dressing, feeding, grooming, toileting and psychosocial support 5. Can the patient actively participate in an intensive therapy program of at least 3 hrs of  therapy per day at least 5 days per week? Yes 6. The potential for patient to make measurable gains while on inpatient rehab is good 7. Anticipated functional outcomes upon discharge from inpatient rehab are supervision and min assist  with PT, supervision and min assist with OT, n/a with SLP. 8. Estimated rehab length of stay to reach the above functional goals is: 15-20 days 9. Does the patient have adequate social supports and living environment to accommodate these discharge functional goals? Yes 10. Anticipated D/C setting: Home 11. Anticipated post D/C treatments: HH therapy and Outpatient therapy 12. Overall Rehab/Functional Prognosis: good  RECOMMENDATIONS: This patient's condition is appropriate for continued rehabilitative care in the following setting: CIR Patient has agreed to participate in recommended program. Pt was sedated from medication when I was in the room. Very motivated to recover per family Note that insurance prior authorization may be required for reimbursement for recommended care.  Comment: Rehab Admissions Coordinator to follow up.  Thanks,  Meredith Staggers, MD, Mellody Drown     02/02/2014

## 2014-02-02 NOTE — Progress Notes (Signed)
Rehab Admissions Coordinator Note:  Patient was screened by Ashaunti Treptow L for appropriateness for an Inpatient Acute Rehab Consult.  At this time, we are recommending Inpatient Rehab consult.  Rhanda Lemire L 02/02/2014, 8:55 AM  I can be reached at [phone].

## 2014-02-03 ENCOUNTER — Inpatient Hospital Stay (HOSPITAL_COMMUNITY): Payer: Medicare Other

## 2014-02-03 ENCOUNTER — Encounter (HOSPITAL_COMMUNITY): Payer: Self-pay | Admitting: Radiology

## 2014-02-03 DIAGNOSIS — I63549 Cerebral infarction due to unspecified occlusion or stenosis of unspecified cerebellar artery: Secondary | ICD-10-CM

## 2014-02-03 LAB — COMPREHENSIVE METABOLIC PANEL
ALBUMIN: 3.2 g/dL — AB (ref 3.5–5.2)
ALT: 20 U/L (ref 0–35)
ANION GAP: 8 (ref 5–15)
AST: 31 U/L (ref 0–37)
Alkaline Phosphatase: 120 U/L — ABNORMAL HIGH (ref 39–117)
BILIRUBIN TOTAL: 1 mg/dL (ref 0.3–1.2)
BUN: 11 mg/dL (ref 6–23)
CALCIUM: 8.6 mg/dL (ref 8.4–10.5)
CO2: 26 mmol/L (ref 19–32)
Chloride: 98 mmol/L (ref 96–112)
Creatinine, Ser: 0.84 mg/dL (ref 0.50–1.10)
GFR calc non Af Amer: 64 mL/min — ABNORMAL LOW (ref >90)
GFR, EST AFRICAN AMERICAN: 75 mL/min — AB (ref >90)
Glucose, Bld: 93 mg/dL (ref 70–99)
POTASSIUM: 3.7 mmol/L (ref 3.5–5.1)
Sodium: 132 mmol/L — ABNORMAL LOW (ref 135–145)
Total Protein: 6.1 g/dL (ref 6.0–8.3)

## 2014-02-03 MED ORDER — IOHEXOL 350 MG/ML SOLN
50.0000 mL | Freq: Once | INTRAVENOUS | Status: AC | PRN
  Administered 2014-02-03: 50 mL via INTRAVENOUS

## 2014-02-03 MED ORDER — SODIUM CHLORIDE 0.9 % IV SOLN
12.5000 mg | Freq: Three times a day (TID) | INTRAVENOUS | Status: DC | PRN
  Administered 2014-02-04: 12.5 mg via INTRAVENOUS
  Filled 2014-02-03 (×3): qty 0.5

## 2014-02-03 MED ORDER — CETYLPYRIDINIUM CHLORIDE 0.05 % MT LIQD
7.0000 mL | Freq: Two times a day (BID) | OROMUCOSAL | Status: DC
  Administered 2014-02-03: 7 mL via OROMUCOSAL

## 2014-02-03 MED ORDER — WHITE PETROLATUM GEL
Status: AC
  Administered 2014-02-03: 18:00:00
  Filled 2014-02-03: qty 1

## 2014-02-03 MED ORDER — CHLORHEXIDINE GLUCONATE 0.12 % MT SOLN
15.0000 mL | Freq: Two times a day (BID) | OROMUCOSAL | Status: DC
  Administered 2014-02-03: 15 mL via OROMUCOSAL
  Filled 2014-02-03 (×4): qty 15

## 2014-02-03 NOTE — Progress Notes (Signed)
STROKE TEAM PROGRESS NOTE   HISTORY [name] is an 81 y.o. female history of hypertension, hyperlipidemia, dementia, nontraumatic cerebral hemorrhage and ureterocele, presenting with new onset vertigo with nausea and vomiting as well as unstable gait. Patient was last known well at 10 PM last night 01/30/2014 (LKW). She noticed symptoms when she attempted to sit on the side of her bed at 4 AM. She's had no change in speech. No focal weakness has been noted. She said no visual changes. She's been taking aspirin daily. CT scan of her head showed nonhemorrhagic infarction involving a large portion of the left cerebral hemisphere inferiorly as well as a small portion of the right cerebellar hemisphere inferomedially. There was slight mass effect on fourth ventricle. NIH stroke score was 0. Patient was not administered TPA secondary to beyond time window for treatment consideration. She was admitted for further evaluation and treatment.   SUBJECTIVE (INTERVAL HISTORY) Her daughter is at the bedside.  She continues to be nauseous but has not had any vomiting and denies headache. He remains sleepy. Repeat CT scan of the head personally reviewed shows persistent cytotoxic edema with mass effect on the fourth ventricle but no hydrocephalus. CT angiogram does not show significant evidence of dissection or large vessel posterior circulation stenosis   OBJECTIVE Temp:  [97.3 F (36.3 C)-98.4 F (36.9 C)] 97.3 F (36.3 C) (01/28 1948) Pulse Rate:  [68-83] 74 (01/28 1948) Cardiac Rhythm:  [-] Normal sinus rhythm (01/28 1200) Resp:  [14-30] 14 (01/28 1948) BP: (111-171)/(61-82) 166/70 mmHg (01/28 1948) SpO2:  [93 %-100 %] 98 % (01/28 1948) Weight:  [134 lb 4.2 oz (60.9 kg)] 134 lb 4.2 oz (60.9 kg) (01/28 0400)  No results for input(s): GLUCAP in the last 168 hours.  Recent Labs Lab 01/31/14 1604 02/02/14 0339 02/03/14 0440  NA 135 130* 132*  K 4.2 3.9 3.7  CL 103 99 98  CO2 25 22 26   GLUCOSE  122* 109* 93  BUN 8 8 11   CREATININE 0.92 0.93 0.84  CALCIUM 9.0 8.5 8.6  MG  --  1.7  --     Recent Labs Lab 01/31/14 1604 02/02/14 0339 02/03/14 0440  AST 28 26 31   ALT 25 20 20   ALKPHOS 143* 127* 120*  BILITOT 0.6 1.0 1.0  PROT 6.6 6.0 6.1  ALBUMIN 3.6 3.3* 3.2*    Recent Labs Lab 01/31/14 1604 02/02/14 0339  WBC 5.9 10.0  NEUTROABS 5.0 8.6*  HGB 12.6 12.2  HCT 36.2 34.6*  MCV 93.3 90.6  PLT 281 234   No results for input(s): CKTOTAL, CKMB, CKMBINDEX, TROPONINI in the last 168 hours. No results for input(s): LABPROT, INR in the last 72 hours.  Recent Labs  01/31/14 2357  COLORURINE YELLOW  LABSPEC 1.012  PHURINE 8.0  GLUCOSEU NEGATIVE  HGBUR NEGATIVE  BILIRUBINUR NEGATIVE  KETONESUR 15*  PROTEINUR NEGATIVE  UROBILINOGEN 0.2  NITRITE NEGATIVE  LEUKOCYTESUR TRACE*       Component Value Date/Time   CHOL 126 02/01/2014 0310   TRIG 46 02/01/2014 0310   HDL 46 02/01/2014 0310   CHOLHDL 2.7 02/01/2014 0310   VLDL 9 02/01/2014 0310   LDLCALC 71 02/01/2014 0310   Lab Results  Component Value Date   HGBA1C 5.6 02/01/2014   No results found for: LABOPIA, COCAINSCRNUR, LABBENZ, AMPHETMU, THCU, LABBARB  No results for input(s): ETH in the last 168 hours.  Ct Angio Head W/cm &/or Wo Cm  02/03/2014   CLINICAL DATA:  Cerebellar stroke.  Hypertension and hyperlipidemia  EXAM: CT ANGIOGRAPHY HEAD AND NECK  TECHNIQUE: Multidetector CT imaging of the head and neck was performed using the standard protocol during bolus administration of intravenous contrast. Multiplanar CT image reconstructions and MIPs were obtained to evaluate the vascular anatomy. Carotid stenosis measurements (when applicable) are obtained utilizing NASCET criteria, using the distal internal carotid diameter as the denominator.  CONTRAST:  57mL OMNIPAQUE IOHEXOL 350 MG/ML SOLN  COMPARISON:  CT head 02/02/2014  FINDINGS: CT HEAD  Brain: Evolving acute infarct in the left inferior cerebellum with  progressive low-density and mass-effect on the fourth ventricle. No associated hemorrhage. No obstructive hydrocephalus. The infarct does not enhance following contrast administration. No other acute infarct. There is atrophy and mild chronic microvascular ischemia.  Calvarium and skull base: Negative  Paranasal sinuses: Air-fluid levels in the sphenoid sinus.  Orbits: Negative  CTA NECK  Aortic arch: Mild atherosclerotic disease in the aortic arch. Proximal great vessels widely patent. Negative for mass or adenopathy in the neck.  Right carotid system: Mild atherosclerotic disease in the the common carotid artery. Ulcerated plaque in the distal common carotid artery. 2 mm ulcer is present in a plaque projecting anteriorly. Plaque is not causing significant stenosis. Mild atherosclerotic plaque at the bifurcation without significant internal carotid artery stenosis. There is moderately severe stenosis of the origin of the external carotid artery.  Left carotid system: Mild atherosclerotic plaque involving the common carotid artery. Mild atherosclerotic disease in the carotid bifurcation without significant stenosis of the internal or external carotid artery on the left. Negative for dissection.  Vertebral arteries:Left vertebral dominant. Mild scattered atherosclerotic disease in both vertebral arteries without significant stenosis or dissection. Both vertebral arteries are patent to the basilar.  Skeleton: Negative for acute abnormality. Mild degenerative change in the cervical spine.  Other neck: Negative for mass in the neck.  CTA HEAD  Anterior circulation: Extensive atherosclerotic calcification in the cavernous carotid bilaterally with mild carotid stenosis bilaterally. 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 This projects inferiorly from the supra clinoid internal carotid artery. No other aneurysm. The anterior and middle cerebral arteries are patent bilaterally without significant  stenosis.  Posterior circulation: Both vertebral arteries are patent to the basilar. Mild atherosclerotic calcification at the C1 level on the left. PICA is patent bilaterally. Acute left PICA infarct noted. PICA apparently has recanalized. The basilar is widely patent. AICA, superior cerebellar and posterior cerebral arteries are patent bilaterally. Fetal origin of the right posterior cerebral artery with hypoplastic right P1 segment. Mild stenosis of the proximal posterior cerebral arteries bilaterally. Negative for aneurysm in the posterior circulation.  Venous sinuses: Dural sinuses are patent without thrombosis or occlusion.  Anatomic variants: None  Delayed phase: No enhancing mass lesion  IMPRESSION: Acute infarct in the left inferior cerebellum in the PICA territory shows progressive edema and swelling with mass effect on the fourth ventricle. No obstructive hydrocephalus or hemorrhage identified. Left PICA is now patent. Both vertebral arteries are widely patent.  Bilateral carotid atherosclerotic disease. Ulcerated plaque involving the right common carotid artery. No significant internal carotid artery stenosis. Moderately severe stenosis proximal right external carotid artery.  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   Electronically Signed   By: Franchot Gallo M.D.   On: 02/03/2014 07:34   Ct Head Wo Contrast  02/02/2014   CLINICAL DATA:  Followup stroke, continued dizziness, nausea and vomiting.  EXAM: CT HEAD WITHOUT CONTRAST  TECHNIQUE: Contiguous axial images were obtained  from the base of the skull through the vertex without intravenous contrast.  COMPARISON:  MRI of the head February 01, 2014  FINDINGS: Wedge-like hypodensity in LEFT inferior frontal lobe corresponding to known infarct, there is increasing cerebellar edema, LEFT greater than RIGHT with partial effacement of the fourth ventricle. Symmetric densities within the foramen of Luschka, unchanged. Very mild effacement  of LEFT quadrigeminal cistern. No intraparenchymal hemorrhage. No hydrocephalus. No supratentorial acute large vascular territory infarct. No abnormal extra-axial fluid collections.  Acute Mild sphenoid ethmoidal sinusitis. The mastoid air cells are well aerated. Mild temporomandibular osteoarthrosis. No skull fracture. Ocular globes and orbital contents are unremarkable.  IMPRESSION: Evolving LEFT posterior inferior cerebellar artery territory infarct with worsening edema, partial effacement of the fourth ventricle without obstructive hydrocephalus. No hemorrhagic conversion.   Electronically Signed   By: Elon Alas   On: 02/02/2014 05:39   Ct Angio Neck W/cm &/or Wo/cm  02/03/2014   CLINICAL DATA:  Cerebellar stroke.  Hypertension and hyperlipidemia  EXAM: CT ANGIOGRAPHY HEAD AND NECK  TECHNIQUE: Multidetector CT imaging of the head and neck was performed using the standard protocol during bolus administration of intravenous contrast. Multiplanar CT image reconstructions and MIPs were obtained to evaluate the vascular anatomy. Carotid stenosis measurements (when applicable) are obtained utilizing NASCET criteria, using the distal internal carotid diameter as the denominator.  CONTRAST:  34mL OMNIPAQUE IOHEXOL 350 MG/ML SOLN  COMPARISON:  CT head 02/02/2014  FINDINGS: CT HEAD  Brain: Evolving acute infarct in the left inferior cerebellum with progressive low-density and mass-effect on the fourth ventricle. No associated hemorrhage. No obstructive hydrocephalus. The infarct does not enhance following contrast administration. No other acute infarct. There is atrophy and mild chronic microvascular ischemia.  Calvarium and skull base: Negative  Paranasal sinuses: Air-fluid levels in the sphenoid sinus.  Orbits: Negative  CTA NECK  Aortic arch: Mild atherosclerotic disease in the aortic arch. Proximal great vessels widely patent. Negative for mass or adenopathy in the neck.  Right carotid system: Mild  atherosclerotic disease in the the common carotid artery. Ulcerated plaque in the distal common carotid artery. 2 mm ulcer is present in a plaque projecting anteriorly. Plaque is not causing significant stenosis. Mild atherosclerotic plaque at the bifurcation without significant internal carotid artery stenosis. There is moderately severe stenosis of the origin of the external carotid artery.  Left carotid system: Mild atherosclerotic plaque involving the common carotid artery. Mild atherosclerotic disease in the carotid bifurcation without significant stenosis of the internal or external carotid artery on the left. Negative for dissection.  Vertebral arteries:Left vertebral dominant. Mild scattered atherosclerotic disease in both vertebral arteries without significant stenosis or dissection. Both vertebral arteries are patent to the basilar.  Skeleton: Negative for acute abnormality. Mild degenerative change in the cervical spine.  Other neck: Negative for mass in the neck.  CTA HEAD  Anterior circulation: Extensive atherosclerotic calcification in the cavernous carotid bilaterally with mild carotid stenosis bilaterally. 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 This projects inferiorly from the supra clinoid internal carotid artery. No other aneurysm. The anterior and middle cerebral arteries are patent bilaterally without significant stenosis.  Posterior circulation: Both vertebral arteries are patent to the basilar. Mild atherosclerotic calcification at the C1 level on the left. PICA is patent bilaterally. Acute left PICA infarct noted. PICA apparently has recanalized. The basilar is widely patent. AICA, superior cerebellar and posterior cerebral arteries are patent bilaterally. Fetal origin of the right posterior cerebral artery with hypoplastic right  P1 segment. Mild stenosis of the proximal posterior cerebral arteries bilaterally. Negative for aneurysm in the posterior circulation.   Venous sinuses: Dural sinuses are patent without thrombosis or occlusion.  Anatomic variants: None  Delayed phase: No enhancing mass lesion  IMPRESSION: Acute infarct in the left inferior cerebellum in the PICA territory shows progressive edema and swelling with mass effect on the fourth ventricle. No obstructive hydrocephalus or hemorrhage identified. Left PICA is now patent. Both vertebral arteries are widely patent.  Bilateral carotid atherosclerotic disease. Ulcerated plaque involving the right common carotid artery. No significant internal carotid artery stenosis. Moderately severe stenosis proximal right external carotid artery.  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   Electronically Signed   By: Franchot Gallo M.D.   On: 02/03/2014 07:34   Carotid Doppler  There is 1-39% bilateral ICA stenosis. Vertebral artery flow is antegrade.     PHYSICAL EXAM pleasant elderly caucasian lady sitting up   in bed.Awake alert. Afebrile. Head is nontraumatic. Neck is supple without bruit. Hearing is normal. Cardiac exam no murmur or gallop. Lungs are clear to auscultation. Distal pulses are well felt. Neurological Exam ; ;  Drowsy but easily aroused oriented x 3. Normal speech and language.eye movements full without nystagmus.fundi were not visualized. Vision acuity and fields appear normal. Hearing is normal. Palatal movements are normal. Face symmetric. Tongue midline. Normal strength, tone, reflexes and coordination. Normal sensation. Gait deferred. ASSESSMENT/PLAN Ms. [name] is a 81 y.o. female with history of hypertension, hyperlipidemia, dementia, nontraumatic cerebral hemorrhage and ureterocele presenting with new onset nausea and vomiting as well as dizziness and unstable gait. She did not receive IV t-PA due to delay in arrival.   Stroke:  left PICA infarct with petechial hemorrhage c/w chronic hypertension, felt to be embolic (? Afib), at risk for neuro worsening given  subtentorial location  Resultant  ataxia  MRI  L PICA infarct, L sphenoid sinusitis  MRA  Thready L PICA, R P2 high grade stenosis  Carotid Doppler  No significant stenosis   2D Echo  pending   HgbA1c pending  Lovenox 40 mg sq daily for VTE prophylaxis  Diet regular thin liquids  aspirin 81 mg orally every day prior to admission, now on clopidogrel 75 mg orally every day  Ongoing aggressive stroke risk factor management  No further embolic workup as pt not an anticoagulation candidate due to fall risk, dementia  Keep in step down for frequent neuro monitoring  Therapy recommendations:  pending   Disposition:  Pending.   Hypertension  Stable  Hyperlipidemia  Home meds:  lipitor 80 resumed in hospital  LDL 71, goal < 70  Continue statin at discharge  Other Stroke Risk Factors  Advanced age  Hx stroke/TIA - hx small L cerebellar infarct 2011; progression L frontal micro hemorrhages since 2009 (? Amyloid)  Family hx stroke (mother)  Other Active Problems  Baseline dementia - ? amyloid  Collapsed vertebrae, requires help with ADLs, recently staying with daughter who helps provide care  Hospital day # 3      I had a long discussion the patient and her daughter that she is at risk of significant cerebral edema, and neurological worsening, hydrocephalus and needs close neurological monitoring for the next 1-2 days. She continues to be drowsy with persistent cytotoxic edema on the CT scan and hence will watch her for 1 more day and she may need transfer to rehabilitation once she is more improved. We will not initiate workup  for source of embolism as she is not a good long-term candidate for anticoagulation given her history of fall risk  Antony Contras, MD Medical Director [facility] Pager: [phone] 02/03/2014 8:07 PM  To contact Stroke Continuity provider, please refer to [email or web address] After hours, contact General Neurology

## 2014-02-03 NOTE — Progress Notes (Signed)
[name] TEAM 1 - Stepdown/ICU TEAM Progress Note  [name] RXV:[id number] DOB: [birth year] DOA: 01/31/2014 PCP: Abigail Miyamoto, MD  Admit HPI / Brief Narrative: [name] is an 81 y.o. WF PMHx hypertension, hyperlipidemia, dementia, nontraumatic cerebral hemorrhage and ureterocele. Presented with new onset vertigo with nausea and vomiting as well as unstable gait. Patient was last known well at 10 PM last night 01/30/2014 (LKW). She noticed symptoms when she attempted to sit on the side of her bed at 4 AM. She's had no change in speech. No focal weakness has been noted. She said no visual changes. She's been taking aspirin daily. CT scan of her head showed nonhemorrhagic infarction involving a large portion of the left cerebral hemisphere inferiorly as well as a small portion of the right cerebellar hemisphere inferomedially. There was slight mass effect on fourth ventricle. NIH stroke score was 0. Patient was not administered TPA secondary to beyond time window for treatment consideration. She was admitted for further evaluation and treatment.    HPI/Subjective: 1/28 A/O 4, still complaining of constant refractory nausea not relieved with Zofran and Phenergan. States believes the Thorazine gave her some relief. Negative CP, negative SOB, negative headache.  Assessment/Plan: Acute CVA; Acute LEFT posterior inferior cerebellar artery territory infarct  -Will await PT/OT recommendations for CIR vs SNF -Allow permissive HTN secondary to new CVA -See HTN --Stroke Team on board - they suggest changing ASA to Plavix 75 mg daily   Hypertension -Continue hydralazine 10 mg  TID PRN SBP > 190  Hyperlipidemia -Lipid panel within AHA guidelines - Continue Lipitor 80 mg daily   Other Stroke Risk Factors -Advanced age Hx stroke/TIA - hx small L cerebellar infarct 2011; - progression L frontal micro hemorrhages since 2009 (? Amyloid) -Family hx stroke (mother)  Refractory  nausea -Patient has had multiple doses of Zofran without decreased in symptoms of nausea. -Thorazine 12.5 mg TID PRN if Zofran and Phenergan ineffective; may increase to 25 mg TID if does not drop patient's BP.  Dementia -At baseline   Code Status: FULL Family Communication: Spoke with son-in-law over phone. Family request goal care meeting in the a.m.  Disposition Plan: CIR vs SNF     Consultants: Dr.Pramod Sethi (neurology stroke team)    Procedure/Significant Events: 1/25 CT head without contrast;- wedge-shaped low-attenuation involving the inferior left cerebellar hemisphere.- smaller focus of low attenuation in the medial inferior right cerebellar hemisphere.  -likely represent subacute nonhemorrhagic infarctions.  -mild to moderate chronic microvascular ischemic change in the deep white matter. 1/26 MRA/MRI brain;MRA HEAD FINDINGS - Patent anterior communicating artery.  -No large vessel occlusion, high-grade stenosis, aneurysm. -Posterior circulation: Mid grade stenosis of RIGHT P 2 segment, with compensatory diminutive RIGHT P1 segment, robust  -No large vessel occlusion, high-grade stenosis, aneurysm.  MRI HEAD:- Acute LEFT posterior inferior cerebellar artery territory infarct with petechial hemorrhage. -Scattered foci of susceptibility artifact in a pattern suggesting sequelae of chronic hypertension. -Thready irregular LEFT posterior-inferior cerebellar artery, this could reflect atherosclerosis or mass effect from acute infarct. 1/26 carotid duplex; Bilateral carotid artery duplex completed: 1-39% ICA stenosis. Vertebral artery flow is antegrade.  1/26 echocardiogram;- Left ventricle: The cavity size was normal. Wall thickness was normal. Systolic function was normal. The estimated ejection fraction was in the range of 55% to 60%. Wall motion was normal; there were no regional wall motion abnormalities. Doppler parameters are consistent with abnormal left  ventricular relaxation (grade 1 diastolic dysfunction).   Culture NA  Antibiotics: NA  DVT prophylaxis: SCD   Devices NA   LINES / TUBES:      Continuous Infusions: . sodium chloride 60 mL/hr at 02/03/14 0526    Objective: VITAL SIGNS: Temp: 97.7 F (36.5 C) (01/28 0719) Temp Source: Oral (01/28 0719) BP: 153/74 mmHg (01/28 1000) Pulse Rate: 77 (01/28 1000) SPO2; FIO2:   Intake/Output Summary (Last 24 hours) at 02/03/14 1236 Last data filed at 02/03/14 1000  Gross per 24 hour  Intake 2057.25 ml  Output   1550 ml  Net 507.25 ml     Exam: General: A/O 4, moderate distress secondary to refractory nausea, No acute respiratory distress Lungs: Clear to auscultation bilaterally without wheezes or crackles Cardiovascular: Regular rate and rhythm without murmur gallop or rub normal S1 and S2 Abdomen: Nontender, nondistended, soft, bowel sounds positive, no rebound, no ascites, no appreciable mass Extremities: No significant cyanosis, clubbing, or edema bilateral lower extremities Neurologic; cranial nerves II through XII intact, tongue/uvula midline, extremity strength 5/5, extremity sensation intact throughout, finger nose finger within normal limits bilateral, quick finger touch bilateral within normal limits, did not ambulate patient.    Data Reviewed: Basic Metabolic Panel:  Recent Labs Lab 01/31/14 1604 02/02/14 0339 02/03/14 0440  NA 135 130* 132*  K 4.2 3.9 3.7  CL 103 99 98  CO2 25 22 26   GLUCOSE 122* 109* 93  BUN 8 8 11   CREATININE 0.92 0.93 0.84  CALCIUM 9.0 8.5 8.6  MG  --  1.7  --    Liver Function Tests:  Recent Labs Lab 01/31/14 1604 02/02/14 0339 02/03/14 0440  AST 28 26 31   ALT 25 20 20   ALKPHOS 143* 127* 120*  BILITOT 0.6 1.0 1.0  PROT 6.6 6.0 6.1  ALBUMIN 3.6 3.3* 3.2*   No results for input(s): LIPASE, AMYLASE in the last 168 hours. No results for input(s): AMMONIA in the last 168 hours. CBC:  Recent Labs Lab  01/31/14 1604 02/02/14 0339  WBC 5.9 10.0  NEUTROABS 5.0 8.6*  HGB 12.6 12.2  HCT 36.2 34.6*  MCV 93.3 90.6  PLT 281 234   Cardiac Enzymes: No results for input(s): CKTOTAL, CKMB, CKMBINDEX, TROPONINI in the last 168 hours. BNP (last 3 results) No results for input(s): PROBNP in the last 8760 hours. CBG: No results for input(s): GLUCAP in the last 168 hours.  Recent Results (from the past 240 hour(s))  MRSA PCR Screening     Status: None   Collection Time: 01/31/14  8:04 PM  Result Value Ref Range Status   MRSA by PCR NEGATIVE NEGATIVE Final    Comment:        The GeneXpert MRSA Assay (FDA approved for NASAL specimens only), is one component of a comprehensive MRSA colonization surveillance program. It is not intended to diagnose MRSA infection nor to guide or monitor treatment for MRSA infections.      Studies:  Recent x-ray studies have been reviewed in detail by the Attending Physician  Scheduled Meds:  Scheduled Meds: . atorvastatin  80 mg Oral q morning - 10a  . cholecalciferol  1,000 Units Oral BID  . clopidogrel  75 mg Oral Daily  . enoxaparin (LOVENOX) injection  40 mg Subcutaneous Q24H  . multivitamin with minerals  1 tablet Oral Daily  . pantoprazole  40 mg Oral Daily  . senna-docusate  1 tablet Oral BID  . vitamin B-12  1,000 mcg Oral Daily    Time spent on care of this patient: 40 mins  Allie Bossier [facility]  [facility] Office  [phone] Pager - [phone]  On-Call/Text Page:      [email or web address]      password [email or web address]  If 7PM-7AM, please contact night-coverage [email or web address] Password [facility] 02/03/2014, 12:36 PM   LOS: 3 days   Care during the described time interval was provided by me . I have reviewed this patient's available data, including medical history, events of note, physical examination, radiology studies and test results as part of my evaluation  Dia Crawford, MD [phone] Pager

## 2014-02-03 NOTE — Evaluation (Signed)
Occupational Therapy Evaluation Patient Details Name: [name] MRN: [id number] DOB: 02-03-1933 Today's Date: 02/03/2014    History of Present Illness 81 y.o. WF PMHx hypertension, hyperlipidemia, dementia, nontraumatic cerebral hemorrhage and ureterocele. Presented with new onset vertigo with nausea and vomiting as well as unstable gait. 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   Clinical Impression   PTA pt lived at home and was using RW and required (A) for bed mobility and ADLs due to recent back compression fx. Pt is currently limited by nausea and dizziness, back pain, and weakness which impair her independence with ADLs. Pt is a strong CIR candidate and would benefit from intensive therapy to progress to Supervision/Mod I level to return home with family support. Pt will continue to benefit from acute OT to address ADLs and functional transfers.    Follow Up Recommendations  CIR;Supervision/Assistance - 24 hour    Equipment Recommendations  Other (comment) (defer to CIR)    Recommendations for Other Services       Precautions / Restrictions Precautions Precautions: Fall Restrictions Weight Bearing Restrictions: No      Mobility Bed Mobility Overal bed mobility: Needs Assistance Bed Mobility: Rolling;Sidelying to Sit;Sit to Sidelying Rolling: Min assist Sidelying to sit: Min assist     Sit to sidelying: Mod assist General bed mobility comments: VCs for technique (re: compression fracture of back, educated on precautions for comfort), Assist to elevate trunk to upright, increased time to perform, nauseated with positional change  Transfers Overall transfer level: Needs assistance   Transfers: Sit to/from Stand;Stand Pivot Transfers Sit to Stand: Mod assist Stand pivot transfers: Mod assist       General transfer comment: Verbal and tactile cues for hand placement. Gait belt assisted face-to-face transfer. Pt able to  assist to push up to rise, then held onto OT's shoulders with UEs. Pt able to take small pivotal steps to [facility] and back. Continues to c/o nausea upon sitting upright. Pt returned to bed and positioned for comfort.     Balance Overall balance assessment: Needs assistance Sitting-balance support: Bilateral upper extremity supported;Feet supported Sitting balance-Leahy Scale: Poor Sitting balance - Comments: Pt requiring assist to sit EOB and has posterior and lateral lean.  Postural control: Posterior lean;Right lateral lean Standing balance support: Bilateral upper extremity supported;During functional activity Standing balance-Leahy Scale: Poor Standing balance comment: Pt stands with right lateral lean.                             ADL Overall ADL's : Needs assistance/impaired Eating/Feeding: Sitting;Minimal assistance Eating/Feeding Details (indicate cue type and reason): pt's daughter reports she has not been eating much due to nausea Grooming: Minimal assistance;Sitting   Upper Body Bathing: Minimal assitance;Sitting   Lower Body Bathing: Maximal assistance;Sit to/from stand   Upper Body Dressing : Moderate assistance;Sitting   Lower Body Dressing: Total assistance;+2 for physical assistance;Sit to/from stand   Toilet Transfer: Moderate assistance;Stand-pivot (gait belt assisted face-to-face transfer)   Toileting- Clothing Manipulation and Hygiene: Set up;Sitting/lateral lean Toileting - Clothing Manipulation Details (indicate cue type and reason): pt was able to use wet washcloth to perform hygiene while sitting on BSC with lateral leans       General ADL Comments: Pt was limited by lethargy this afternoon, however was motivated to get to [facility] for toileting after sitting EOB. Pt continues to c/o nausea but reports she is not currently dizzy.  Vision                 Additional Comments: To be further assessed. Pt unable to keep eyes open due to  combination of lethargy and nausea. Reports she feels better with her eyes closed.           Pertinent Vitals/Pain Pain Assessment: Faces Faces Pain Scale: Hurts little more Pain Location: back Pain Descriptors / Indicators: Aching Pain Intervention(s): Monitored during session;Repositioned     Hand Dominance Right   Extremity/Trunk Assessment Upper Extremity Assessment Upper Extremity Assessment: Generalized weakness   Lower Extremity Assessment Lower Extremity Assessment: Generalized weakness   Cervical / Trunk Assessment Cervical / Trunk Assessment: Kyphotic   Communication Communication Communication: No difficulties   Cognition Arousal/Alertness: Lethargic Behavior During Therapy: Flat affect Overall Cognitive Status: History of cognitive impairments - at baseline                                Home Living Family/patient expects to be discharged to:: Inpatient rehab                                        Prior Functioning/Environment Level of Independence: Needs assistance  Gait / Transfers Assistance Needed: ambulated with RW, had assist for bed mobility secondary to recent compression fracture in spine ADL's / Homemaking Assistance Needed: family reports they were assisting with ADLs since her compression fracture        OT Diagnosis: Generalized weakness;Cognitive deficits;Acute pain   OT Problem List: Decreased strength;Decreased range of motion;Decreased activity tolerance;Impaired balance (sitting and/or standing);Decreased cognition;Decreased safety awareness;Decreased knowledge of use of DME or AE;Decreased knowledge of precautions;Pain   OT Treatment/Interventions: Self-care/ADL training;Therapeutic exercise;Neuromuscular education;Energy conservation;DME and/or AE instruction;Therapeutic activities;Cognitive remediation/compensation;Patient/family education;Balance training    OT Goals(Current goals can be found in the care  plan section) Acute Rehab OT Goals Patient Stated Goal: to feel better OT Goal Formulation: With patient/family Time For Goal Achievement: 02/17/14 Potential to Achieve Goals: Good ADL Goals Pt Will Perform Eating: sitting;with modified independence Pt Will Perform Grooming: with modified independence;sitting Pt Will Perform Upper Body Bathing: with set-up;with supervision;sitting Pt Will Perform Upper Body Dressing: with set-up;with supervision;sitting Pt Will Transfer to Toilet: with min guard assist;stand pivot transfer;bedside commode  OT Frequency: Min 3X/week    End of Session Equipment Utilized During Treatment: Gait belt Nurse Communication: Mobility status;Other (comment) (pt urinated in [facility])  Activity Tolerance: Patient limited by lethargy;Other (comment) (limited by nausea) Patient left: in bed;with call bell/phone within reach;with bed alarm set;with family/visitor present   Time: 3329-5188 OT Time Calculation (min): 23 min Charges:  OT General Charges $OT Visit: 1 Procedure OT Evaluation $Initial OT Evaluation Tier I: 1 Procedure OT Treatments $Self Care/Home Management : 8-22 mins G-Codes:    Juluis Rainier 2014-03-05, 6:00 PM  Cyndie Chime, OTR/L Occupational Therapist [phone] (pager)

## 2014-02-03 NOTE — Progress Notes (Signed)
Rehab admissions - Evaluated for possible admission.  Met with patient and her daughter at the bedside.  Dtr would like inpatient rehab and then home with family if possible.  I have called [employer] and have opened the case.  I will need the OT eval completed so that I can send it to the insurance carrier.  Patient very sleepy today.  I will follow up again in am.  Dtr to speak with her siblings to finalize plans for rehab.  Call me for questions.  #[phone]

## 2014-02-03 NOTE — Progress Notes (Signed)
Medicare Important Message given? YES  (If response is "NO", the following Medicare IM given date fields will be blank)  Date Medicare IM given: 02/03/14 Medicare IM given by:  Dahlia Client [employer]

## 2014-02-03 NOTE — Progress Notes (Signed)
Physical Therapy Treatment Patient Details Name: [name] MRN: [id number] DOB: 1933/12/27 Today's Date: 02/03/2014    History of Present Illness 81 y.o. WF PMHx hypertension, hyperlipidemia, dementia, nontraumatic cerebral hemorrhage and ureterocele. Presented with new onset vertigo with nausea and vomiting as well as unstable gait. 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    PT Comments    Pt admitted with above diagnosis. Pt currently with functional limitations due to continued nausea and dizziness as well as balance and endurance deficits.  Rehab is definitely a great venue for pt.  Pt will benefit from skilled PT to increase their independence and safety with mobility to allow discharge to the venue listed below.    Follow Up Recommendations  CIR;Supervision/Assistance - 24 hour     Equipment Recommendations  Other (comment) (TBA)    Recommendations for Other Services Rehab consult     Precautions / Restrictions Precautions Precautions: Fall Restrictions Weight Bearing Restrictions: No    Mobility  Bed Mobility Overal bed mobility: Needs Assistance Bed Mobility: Rolling;Sidelying to Sit Rolling: Min assist Sidelying to sit: Min assist       General bed mobility comments: VCs for technique (re: compression fracture of back, educated on precautions for comfort), Assist to elevate trunk to upright, increased time to perform, nauseated with positional change  Transfers Overall transfer level: Needs assistance Equipment used: 2 person hand held assist Transfers: Sit to/from [facility] Sit to Stand: Mod assist Stand pivot transfers: Mod assist;+2 safety/equipment       General transfer comment: Vcs for hand placement, patient able to elevate to standing with moderate  assist for stability as she was leaning significantly to her right.  Continues to endorse nausea as well.  Upon standing, pt reported she  needed to urinate therefore nurse got 3N1 and pt able to take 5 pivotal steps to 3N1.  Once finished, pt cleaned and changed wet garments.  Pt then stood and was cleaned from urinating and having small BM.  Then she took pivotal steps to recliner.  Positioned well in recliner with pillows.    Ambulation/Gait Ambulation/Gait assistance: Mod assist;+2 physical assistance Ambulation Distance (Feet): 6 Feet Assistive device: 2 person hand held assist Gait Pattern/deviations: Step-to pattern;Decreased stride length;Shuffle;Trunk flexed;Narrow base of support;Ataxic Gait velocity: decreased Gait velocity interpretation: Below normal speed for age/gender General Gait Details: limited by nausea, unable to determine normal gait pattern or possible ataxic coordination with gait due to limited overall mobility re: nausea.  Significant lean to right.  Target "A" did help pt with orientation but pt needed constant cues to keep eyes on target "A".     Stairs            Wheelchair Mobility    Modified Rankin (Stroke Patients Only) Modified Rankin (Stroke Patients Only) Pre-Morbid Rankin Score: No significant disability Modified Rankin: Severe disability     Balance Overall balance assessment: Needs assistance;History of Falls Sitting-balance support: Bilateral upper extremity supported;Feet supported Sitting balance-Leahy Scale: Poor Sitting balance - Comments: Pt leaning posterior and to right.  Took a good 10 minutes trying to work on righting reactions as well as introducing "A" target for pt to focus on.   Postural control: Posterior lean;Right lateral lean Standing balance support: Bilateral upper extremity supported;During functional activity Standing balance-Leahy Scale: Poor Standing balance comment: Pt stands with right lateral lean.  Can correct somewhat using target "A" but needs constant cues and cannot maintain upright even with cues.  High level balance  activites: Direction changes;Turns;Sudden stops;Head turns High Level Balance Comments: Pt can do none of the above.  Too unsteady.      Cognition Arousal/Alertness: Awake/alert Behavior During Therapy: Flat affect Overall Cognitive Status: History of cognitive impairments - at baseline                      Exercises Other Exercises Other Exercises: Forced prolonged positioning     General Comments General comments (skin integrity, edema, etc.): Pt treated for right posterior canal BPPV due to positive hallpike.  Used bed since pt has back issues for positioning.  Question if pt has horizontal canal involvement on right as well.  Informed daughter that pt can do forced prolonged positioning tonight. Daughter to help pt get in this position.        Pertinent Vitals/Pain Pain Assessment: No/denies pain  VSS    Home Living                      Prior Function            PT Goals (current goals can now be found in the care plan section) Progress towards PT goals: Not progressing toward goals - comment (Continues to be limited by nausea and dizziness)    Frequency  Min 3X/week    PT Plan Current plan remains appropriate    Co-evaluation             End of Session Equipment Utilized During Treatment: Gait belt Activity Tolerance: Patient limited by fatigue;Treatment limited secondary to medical complications (Comment) (nausea) Patient left: in chair;with call bell/phone within reach;with family/visitor present     Time: 3748-2707 PT Time Calculation (min) (ACUTE ONLY): 49 min  Charges:  $Gait Training: 8-22 mins $Self Care/Home Management: 8-22 $[facility] Rep Proc: 8-22 mins                    G Codes:      Irwin Brakeman F 02/04/2014, 11:09 AM [facility] [phone] (pager)

## 2014-02-04 DIAGNOSIS — E46 Unspecified protein-calorie malnutrition: Secondary | ICD-10-CM

## 2014-02-04 NOTE — Progress Notes (Signed)
Physical Therapy Treatment Patient Details Name: [name] MRN: [id number] DOB: Jul 03, 1933 Today's Date: 02/04/2014    History of Present Illness 81 y.o. WF PMHx hypertension, hyperlipidemia, dementia, nontraumatic cerebral hemorrhage and ureterocele. Presented with new onset vertigo with nausea and vomiting as well as unstable gait. 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    PT Comments    Pt admitted with above diagnosis. Pt currently with functional limitations due to balance and endurance deficits as well as due to severe vertigo.  Will need continued therapy to progress pt through exercises and mobility.  Hopeful that Rehab will have bed first of next week.    Pt will benefit from skilled PT to increase their independence and safety with mobility to allow discharge to the venue listed below.    Follow Up Recommendations  CIR;Supervision/Assistance - 24 hour     Equipment Recommendations  Other (comment) (TBA)    Recommendations for Other Services Rehab consult     Precautions / Restrictions Precautions Precautions: Fall Restrictions Weight Bearing Restrictions: No    Mobility  Bed Mobility Overal bed mobility: Needs Assistance Bed Mobility: Rolling;Sidelying to Sit;Sit to Sidelying Rolling: Min assist Sidelying to sit: Mod assist;+2 for physical assistance       General bed mobility comments: VCs for technique (re: compression fracture of back, educated on precautions for comfort), Assist to elevate trunk to upright, increased time to perform, nauseated with positional change.  Pt sitting balance much worse today with pt unable to acheive full upright posture with right lateral lean.  Tried to get pt to use target "A" however pt was closing her eyes a lot during treatment.    Transfers Overall transfer level: Needs assistance Equipment used: Rolling walker (2 wheeled) Transfers: Sit to/from Stand Sit to Stand: Mod  assist;+2 physical assistance         General transfer comment: Verbal cues for hand placement.  Pt needed mod assist to stand and was leaning heavily to right.  Was able to right self with cues but could not hold position more than a few seconds.    Ambulation/Gait Ambulation/Gait assistance: Mod assist;Max assist;+2 physical assistance Ambulation Distance (Feet): 9 Feet (6 feet and then 3 feet ) Assistive device: Rolling walker (2 wheeled) Gait Pattern/deviations: Step-to pattern;Decreased stride length;Shuffle;Trunk flexed Gait velocity: decreased Gait velocity interpretation: Below normal speed for age/gender General Gait Details: limited by nausea, unable to determine normal gait pattern or possible ataxic coordination with gait due to limited overall mobility re: nausea.  Significant lean to right.  Target "A" did help pt with orientation but pt needed constant cues to keep eyes on target "A".   Noted that pt was ambulating on her tiptoes.  Pt was letting her body get ahead of her feet as well with very poor balance.  Was unable to progress ambulation due to pt with poor postural stablity and nauseated and requested to sit down.  MD came into room and was asking why pt had not asked for nausea med.  He encouraged pt to keep nausea med in her system so she can work with therapy.  This PT reiterated to daughter the importance of pt sitting up in chair for at least an hour 2 x day if possible.  Positioned pt comfortably in chair upon departure.    Stairs            Wheelchair Mobility    Modified Rankin (Stroke Patients Only) Modified Rankin (Stroke Patients Only)  Pre-Morbid Rankin Score: No significant disability Modified Rankin: Severe disability     Balance Overall balance assessment: Needs assistance Sitting-balance support: Bilateral upper extremity supported;Feet supported Sitting balance-Leahy Scale: Poor Sitting balance - Comments: Pt requiring assist to sit EOB and  has posterior and lateral lean.  Postural control: Right lateral lean;Posterior lean Standing balance support: Bilateral upper extremity supported;During functional activity Standing balance-Leahy Scale: Poor Standing balance comment: Pt stands with right lateral lean.  Can correct using target "A" but needs constant cues and cannot maintain upright even with cues.  Today, her balance was considerably worse with PT assisting much more.               High level balance activites: Direction changes;Turns;Sudden stops;Head turns High Level Balance Comments: Pt can do none of the above.  Too unsteady.      Cognition Arousal/Alertness: Lethargic Behavior During Therapy: Flat affect Overall Cognitive Status: History of cognitive impairments - at baseline                      Exercises Other Exercises Other Exercises: Forced prolonged positioning reviewed    General Comments General comments (skin integrity, edema, etc.): Pt could not tolerate any further vertigo testing.  Reeducated daughter to have pt do forced prolonged positioning tonight as daughter forgot last night.  Gave pt and family handout re: risk factors for stroke and why stroke pts are tired.        Pertinent Vitals/Pain Pain Assessment: Faces Faces Pain Scale: Hurts little more Pain Location: back and head Pain Descriptors / Indicators: Aching Pain Intervention(s): Limited activity within patient's tolerance;Monitored during session;Repositioned  VSS    Home Living                      Prior Function            PT Goals (current goals can now be found in the care plan section) Progress towards PT goals: Not progressing toward goals - comment (due to nausea and dizziness)    Frequency  Min 3X/week    PT Plan Current plan remains appropriate    Co-evaluation             End of Session Equipment Utilized During Treatment: Gait belt Activity Tolerance: Patient limited by  fatigue;Treatment limited secondary to medical complications (Comment) (nausea) Patient left: in chair;with call bell/phone within reach;with family/visitor present     Time: 1275-1700 PT Time Calculation (min) (ACUTE ONLY): 30 min  Charges:  $Gait Training: 23-37 mins                    G CodesDenice Paradise 02-18-2014, 3:41 PM [employer] Acute Rehabilitation [phone] (pager)

## 2014-02-04 NOTE — Progress Notes (Signed)
STROKE TEAM PROGRESS NOTE   HISTORY Genieve Ramaswamy is an 81 y.o. female history of hypertension, hyperlipidemia, dementia, nontraumatic cerebral hemorrhage and ureterocele, presenting with new onset vertigo with nausea and vomiting as well as unstable gait. Patient was last known well at 10 PM last night 01/30/2014 (LKW). She noticed symptoms when she attempted to sit on the side of her bed at 4 AM. She's had no change in speech. No focal weakness has been noted. She said no visual changes. She's been taking aspirin daily. CT scan of her head showed nonhemorrhagic infarction involving a large portion of the left cerebral hemisphere inferiorly as well as a small portion of the right cerebellar hemisphere inferomedially. There was slight mass effect on fourth ventricle. NIH stroke score was 0. Patient was not administered TPA secondary to beyond time window for treatment consideration. She was admitted for further evaluation and treatment.   SUBJECTIVE (INTERVAL HISTORY) Her daughter is at the bedside.  She continues to be nauseous but has not had any vomiting and denies headache. She remains sleepy.    OBJECTIVE Temp:  [97.3 F (36.3 C)-98.5 F (36.9 C)] 98.5 F (36.9 C) (01/29 1214) Pulse Rate:  [65-80] 65 (01/29 0600) Cardiac Rhythm:  [-] Normal sinus rhythm (01/29 0322) Resp:  [14-23] 23 (01/29 0600) BP: (111-168)/(56-82) 159/73 mmHg (01/29 0600) SpO2:  [93 %-99 %] 99 % (01/29 0600)  No results for input(s): GLUCAP in the last 168 hours.  Recent Labs Lab 01/31/14 1604 02/02/14 0339 02/03/14 0440  NA 135 130* 132*  K 4.2 3.9 3.7  CL 103 99 98  CO2 25 22 26   GLUCOSE 122* 109* 93  BUN 8 8 11   CREATININE 0.92 0.93 0.84  CALCIUM 9.0 8.5 8.6  MG  --  1.7  --     Recent Labs Lab 01/31/14 1604 02/02/14 0339 02/03/14 0440  AST 28 26 31   ALT 25 20 20   ALKPHOS 143* 127* 120*  BILITOT 0.6 1.0 1.0  PROT 6.6 6.0 6.1  ALBUMIN 3.6 3.3* 3.2*    Recent Labs Lab 01/31/14 1604  02/02/14 0339  WBC 5.9 10.0  NEUTROABS 5.0 8.6*  HGB 12.6 12.2  HCT 36.2 34.6*  MCV 93.3 90.6  PLT 281 234   No results for input(s): CKTOTAL, CKMB, CKMBINDEX, TROPONINI in the last 168 hours. No results for input(s): LABPROT, INR in the last 72 hours. No results for input(s): COLORURINE, LABSPEC, [place], GLUCOSEU, HGBUR, BILIRUBINUR, KETONESUR, PROTEINUR, UROBILINOGEN, NITRITE, LEUKOCYTESUR in the last 72 hours.  Invalid input(s): APPERANCEUR     Component Value Date/Time   CHOL 126 02/01/2014 0310   TRIG 46 02/01/2014 0310   HDL 46 02/01/2014 0310   CHOLHDL 2.7 02/01/2014 0310   VLDL 9 02/01/2014 0310   LDLCALC 71 02/01/2014 0310   Lab Results  Component Value Date   HGBA1C 5.6 02/01/2014   No results found for: LABOPIA, COCAINSCRNUR, LABBENZ, AMPHETMU, THCU, LABBARB  No results for input(s): ETH in the last 168 hours.  Ct Angio Head W/cm &/or Wo Cm  02/03/2014   CLINICAL DATA:  Cerebellar stroke.  Hypertension and hyperlipidemia  EXAM: CT ANGIOGRAPHY HEAD AND NECK  TECHNIQUE: Multidetector CT imaging of the head and neck was performed using the standard protocol during bolus administration of intravenous contrast. Multiplanar CT image reconstructions and MIPs were obtained to evaluate the vascular anatomy. Carotid stenosis measurements (when applicable) are obtained utilizing NASCET criteria, using the distal internal carotid diameter as the denominator.  CONTRAST:  22mL  OMNIPAQUE IOHEXOL 350 MG/ML SOLN  COMPARISON:  CT head 02/02/2014  FINDINGS: CT HEAD  Brain: Evolving acute infarct in the left inferior cerebellum with progressive low-density and mass-effect on the fourth ventricle. No associated hemorrhage. No obstructive hydrocephalus. The infarct does not enhance following contrast administration. No other acute infarct. There is atrophy and mild chronic microvascular ischemia.  Calvarium and skull base: Negative  Paranasal sinuses: Air-fluid levels in the sphenoid sinus.   Orbits: Negative  CTA NECK  Aortic arch: Mild atherosclerotic disease in the aortic arch. Proximal great vessels widely patent. Negative for mass or adenopathy in the neck.  Right carotid system: Mild atherosclerotic disease in the the common carotid artery. Ulcerated plaque in the distal common carotid artery. 2 mm ulcer is present in a plaque projecting anteriorly. Plaque is not causing significant stenosis. Mild atherosclerotic plaque at the bifurcation without significant internal carotid artery stenosis. There is moderately severe stenosis of the origin of the external carotid artery.  Left carotid system: Mild atherosclerotic plaque involving the common carotid artery. Mild atherosclerotic disease in the carotid bifurcation without significant stenosis of the internal or external carotid artery on the left. Negative for dissection.  Vertebral arteries:Left vertebral dominant. Mild scattered atherosclerotic disease in both vertebral arteries without significant stenosis or dissection. Both vertebral arteries are patent to the basilar.  Skeleton: Negative for acute abnormality. Mild degenerative change in the cervical spine.  Other neck: Negative for mass in the neck.  CTA HEAD  Anterior circulation: Extensive atherosclerotic calcification in the cavernous carotid bilaterally with mild carotid stenosis bilaterally. 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 This projects inferiorly from the supra clinoid internal carotid artery. No other aneurysm. The anterior and middle cerebral arteries are patent bilaterally without significant stenosis.  Posterior circulation: Both vertebral arteries are patent to the basilar. Mild atherosclerotic calcification at the C1 level on the left. PICA is patent bilaterally. Acute left PICA infarct noted. PICA apparently has recanalized. The basilar is widely patent. AICA, superior cerebellar and posterior cerebral arteries are patent bilaterally. Fetal  origin of the right posterior cerebral artery with hypoplastic right P1 segment. Mild stenosis of the proximal posterior cerebral arteries bilaterally. Negative for aneurysm in the posterior circulation.  Venous sinuses: Dural sinuses are patent without thrombosis or occlusion.  Anatomic variants: None  Delayed phase: No enhancing mass lesion  IMPRESSION: Acute infarct in the left inferior cerebellum in the PICA territory shows progressive edema and swelling with mass effect on the fourth ventricle. No obstructive hydrocephalus or hemorrhage identified. Left PICA is now patent. Both vertebral arteries are widely patent.  Bilateral carotid atherosclerotic disease. Ulcerated plaque involving the right common carotid artery. No significant internal carotid artery stenosis. Moderately severe stenosis proximal right external carotid artery.  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   Electronically Signed   By: Franchot Gallo M.D.   On: 02/03/2014 07:34   Ct Angio Neck W/cm &/or Wo/cm  02/03/2014   CLINICAL DATA:  Cerebellar stroke.  Hypertension and hyperlipidemia  EXAM: CT ANGIOGRAPHY HEAD AND NECK  TECHNIQUE: Multidetector CT imaging of the head and neck was performed using the standard protocol during bolus administration of intravenous contrast. Multiplanar CT image reconstructions and MIPs were obtained to evaluate the vascular anatomy. Carotid stenosis measurements (when applicable) are obtained utilizing NASCET criteria, using the distal internal carotid diameter as the denominator.  CONTRAST:  12mL OMNIPAQUE IOHEXOL 350 MG/ML SOLN  COMPARISON:  CT head 02/02/2014  FINDINGS: CT HEAD  Brain: Evolving acute infarct in the left inferior cerebellum with progressive low-density and mass-effect on the fourth ventricle. No associated hemorrhage. No obstructive hydrocephalus. The infarct does not enhance following contrast administration. No other acute infarct. There is atrophy and mild chronic  microvascular ischemia.  Calvarium and skull base: Negative  Paranasal sinuses: Air-fluid levels in the sphenoid sinus.  Orbits: Negative  CTA NECK  Aortic arch: Mild atherosclerotic disease in the aortic arch. Proximal great vessels widely patent. Negative for mass or adenopathy in the neck.  Right carotid system: Mild atherosclerotic disease in the the common carotid artery. Ulcerated plaque in the distal common carotid artery. 2 mm ulcer is present in a plaque projecting anteriorly. Plaque is not causing significant stenosis. Mild atherosclerotic plaque at the bifurcation without significant internal carotid artery stenosis. There is moderately severe stenosis of the origin of the external carotid artery.  Left carotid system: Mild atherosclerotic plaque involving the common carotid artery. Mild atherosclerotic disease in the carotid bifurcation without significant stenosis of the internal or external carotid artery on the left. Negative for dissection.  Vertebral arteries:Left vertebral dominant. Mild scattered atherosclerotic disease in both vertebral arteries without significant stenosis or dissection. Both vertebral arteries are patent to the basilar.  Skeleton: Negative for acute abnormality. Mild degenerative change in the cervical spine.  Other neck: Negative for mass in the neck.  CTA HEAD  Anterior circulation: Extensive atherosclerotic calcification in the cavernous carotid bilaterally with mild carotid stenosis bilaterally. 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 This projects inferiorly from the supra clinoid internal carotid artery. No other aneurysm. The anterior and middle cerebral arteries are patent bilaterally without significant stenosis.  Posterior circulation: Both vertebral arteries are patent to the basilar. Mild atherosclerotic calcification at the C1 level on the left. PICA is patent bilaterally. Acute left PICA infarct noted. PICA apparently has recanalized.  The basilar is widely patent. AICA, superior cerebellar and posterior cerebral arteries are patent bilaterally. Fetal origin of the right posterior cerebral artery with hypoplastic right P1 segment. Mild stenosis of the proximal posterior cerebral arteries bilaterally. Negative for aneurysm in the posterior circulation.  Venous sinuses: Dural sinuses are patent without thrombosis or occlusion.  Anatomic variants: None  Delayed phase: No enhancing mass lesion  IMPRESSION: Acute infarct in the left inferior cerebellum in the PICA territory shows progressive edema and swelling with mass effect on the fourth ventricle. No obstructive hydrocephalus or hemorrhage identified. Left PICA is now patent. Both vertebral arteries are widely patent.  Bilateral carotid atherosclerotic disease. Ulcerated plaque involving the right common carotid artery. No significant internal carotid artery stenosis. Moderately severe stenosis proximal right external carotid artery.  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   Electronically Signed   By: Franchot Gallo M.D.   On: 02/03/2014 07:34   Carotid Doppler  There is 1-39% bilateral ICA stenosis. Vertebral artery flow is antegrade.     PHYSICAL EXAM pleasant elderly caucasian lady sitting up   in bed.Awake alert. Afebrile. Head is nontraumatic. Neck is supple without bruit. Hearing is normal. Cardiac exam no murmur or gallop. Lungs are clear to auscultation. Distal pulses are well felt. Neurological Exam ; ;  Drowsy but easily aroused oriented x 3. Normal speech and language.eye movements full without nystagmus.fundi were not visualized. Vision acuity and fields appear normal. Hearing is normal. Palatal movements are normal. Face symmetric. Tongue midline. Normal strength, tone, reflexes and coordination. Normal sensation. Gait deferred. ASSESSMENT/PLAN Ms. Alisah Grandberry is a  81 y.o. female with history of hypertension, hyperlipidemia, dementia, nontraumatic  cerebral hemorrhage and ureterocele presenting with new onset nausea and vomiting as well as dizziness and unstable gait. She did not receive IV t-PA due to delay in arrival.   Stroke:  left PICA infarct with petechial hemorrhage c/w chronic hypertension, felt to be embolic (? Afib), at risk for neuro worsening given subtentorial location  Resultant  ataxia  MRI  L PICA infarct, L sphenoid sinusitis  MRA  Thready L PICA, R P2 high grade stenosis  Carotid Doppler  No significant stenosis   2D Echo  pending   HgbA1c pending  Lovenox 40 mg sq daily for VTE prophylaxis  Diet regular thin liquids  aspirin 81 mg orally every day prior to admission, now on clopidogrel 75 mg orally every day  Ongoing aggressive stroke risk factor management  No further embolic workup as pt not an anticoagulation candidate due to fall risk, dementia  Keep in step down for frequent neuro monitoring  Therapy recommendations:  pending   Disposition:  Pending.   Hypertension  Stable  Hyperlipidemia  Home meds:  lipitor 80 resumed in hospital  LDL 71, goal < 70  Continue statin at discharge  Other Stroke Risk Factors  Advanced age  Hx stroke/TIA - hx small L cerebellar infarct 2011; progression L frontal micro hemorrhages since 2009 (? Amyloid)  Family hx stroke (mother)  Other Active Problems  Baseline dementia - ? amyloid  Collapsed vertebrae, requires help with ADLs, recently staying with daughter who helps provide care  Hospital day # 4      I had a long discussion the patient and her daughter that she is at risk of significant cerebral edema, and neurological worsening, hydrocephalus and needs close neurological monitoring for the next 1-2 days. She continues to be drowsy with persistent cytotoxic edema on the CT scan and hence will watch her for over the weekend and she may need transfer to rehabilitation once she is more improved. We will not initiate workup for source of  embolism as she is not a good long-term candidate for anticoagulation given her history of fall risk D/w daughter, multiple family members and rehab coordinator.Repeat CT head in am. Can  Transfer to floor bed today Antony Contras, MD Medical Director [place] Pager: [phone] 02/03/2014 8:07 PM  To contact Stroke Continuity provider, please refer to [email or web address] After hours, contact General Neurology

## 2014-02-04 NOTE — Progress Notes (Signed)
[facility] TEAM 1 - Stepdown/ICU TEAM Progress Note  [name] XIP:[id number] DOB: [birth year] DOA: 01/31/2014 PCP: Abigail Miyamoto, MD  Admit HPI / Brief Narrative: [name] is an 81 y.o. WF PMHx hypertension, hyperlipidemia, dementia, nontraumatic cerebral hemorrhage and ureterocele. Presented with new onset vertigo with nausea and vomiting as well as unstable gait. Patient was last known well at 10 PM last night 01/30/2014 (LKW). She noticed symptoms when she attempted to sit on the side of her bed at 4 AM. She's had no change in speech. No focal weakness has been noted. She said no visual changes. She's been taking aspirin daily. CT scan of her head showed nonhemorrhagic infarction involving a large portion of the left cerebral hemisphere inferiorly as well as a small portion of the right cerebellar hemisphere inferomedially. There was slight mass effect on fourth ventricle. NIH stroke score was 0. Patient was not administered TPA secondary to beyond time window for treatment consideration. She was admitted for further evaluation and treatment.    HPI/Subjective: 1/28 A/O 4, still complaining of constant refractory nausea not relieved with Zofran and Phenergan. States believes the Thorazine gave her some relief. Negative CP, negative SOB, negative headache.  Assessment/Plan: Acute CVA; Acute LEFT posterior inferior cerebellar artery territory infarct  -Will await PT/OT recommendations for CIR vs SNF -Allow permissive HTN secondary to new CVA -See HTN --Stroke Team on board - they suggest changing ASA to Plavix 75 mg daily   Hypertension -Continue hydralazine 10 mg  TID PRN SBP > 190  Hyperlipidemia -Lipid panel within AHA guidelines - Continue Lipitor 80 mg daily   Other Stroke Risk Factors -Advanced age Hx stroke/TIA - hx small L cerebellar infarct 2011; - progression L frontal micro hemorrhages since 2009 (? Amyloid) -Family hx stroke (mother)  Refractory  nausea -Patient has had multiple doses of Zofran without decreased in symptoms of nausea. -Continue Thorazine 12.5 mg TID 30 min prior to meals; may increase to 25 mg TID if does not drop patient's BP.  -Phenergan found to make patient extremely sleepy and unable to participate during the day with physical therapy DC  Dementia -At baseline  Malnutrition -Patient and family agrees that NG tube will be inserted in the a.m. if patient unable to consume at least 50% of her meals -Consult to dietitian   Code Status: FULL Family Communication: Spoke with entire family for approximately 45 minutes on goals of care.   Disposition Plan: CIR vs SNF     Consultants: Dr.Pramod Sethi (neurology stroke team)    Procedure/Significant Events: 1/25 CT head without contrast;- wedge-shaped low-attenuation involving the inferior left cerebellar hemisphere.- smaller focus of low attenuation in the medial inferior right cerebellar hemisphere.  -likely represent subacute nonhemorrhagic infarctions.  -mild to moderate chronic microvascular ischemic change in the deep white matter. 1/26 MRA/MRI brain;MRA HEAD FINDINGS - Patent anterior communicating artery.  -No large vessel occlusion, high-grade stenosis, aneurysm. -Posterior circulation: Mid grade stenosis of RIGHT P 2 segment, with compensatory diminutive RIGHT P1 segment, robust  -No large vessel occlusion, high-grade stenosis, aneurysm.  MRI HEAD:- Acute LEFT posterior inferior cerebellar artery territory infarct with petechial hemorrhage. -Scattered foci of susceptibility artifact in a pattern suggesting sequelae of chronic hypertension. -Thready irregular LEFT posterior-inferior cerebellar artery, this could reflect atherosclerosis or mass effect from acute infarct. 1/26 carotid duplex; Bilateral carotid artery duplex completed: 1-39% ICA stenosis. Vertebral artery flow is antegrade.  1/26 echocardiogram;- Left ventricle: The cavity size was  normal. Wall thickness was normal.  Systolic function was normal. The estimated ejection fraction was in the range of 55% to 60%. Wall motion was normal; there were no regional wall motion abnormalities. Doppler parameters are consistent with abnormal left ventricular relaxation (grade 1 diastolic dysfunction).   Culture NA  Antibiotics: NA  DVT prophylaxis: SCD   Devices NA   LINES / TUBES:      Continuous Infusions: . sodium chloride 60 mL/hr at 02/04/14 0600    Objective: VITAL SIGNS: Temp: 98.5 F (36.9 C) (01/29 1214) Temp Source: Oral (01/29 1214) BP: 159/73 mmHg (01/29 0600) Pulse Rate: 65 (01/29 0600) SPO2; FIO2:   Intake/Output Summary (Last 24 hours) at 02/04/14 1341 Last data filed at 02/04/14 0700  Gross per 24 hour  Intake   1320 ml  Output   1425 ml  Net   -105 ml     Exam: General: A/O 4, while walking with physical therapy moderate distress secondary to refractory nausea (physical therapy session had to be cut short), No acute respiratory distress Lungs: Clear to auscultation bilaterally without wheezes or crackles Cardiovascular: Regular rate and rhythm without murmur gallop or rub normal S1 and S2 Abdomen: Nontender, nondistended, soft, bowel sounds positive, no rebound, no ascites, no appreciable mass Extremities: No significant cyanosis, clubbing, or edema bilateral lower extremities Neurologic; cranial nerves II through XII intact, tongue/uvula midline, extremity strength 5/5, extremity sensation intact throughout, finger nose finger within normal limits bilateral, quick finger touch bilateral within normal limits,  while patient ambulated with physical therapy constantly needed to look at floor and her head tended to drift to the right ( was able to hold her head up right and look straight ahead with significant verbal cues).     Data Reviewed: Basic Metabolic Panel:  Recent Labs Lab 01/31/14 1604 02/02/14 0339  02/03/14 0440  NA 135 130* 132*  K 4.2 3.9 3.7  CL 103 99 98  CO2 25 22 26   GLUCOSE 122* 109* 93  BUN 8 8 11   CREATININE 0.92 0.93 0.84  CALCIUM 9.0 8.5 8.6  MG  --  1.7  --    Liver Function Tests:  Recent Labs Lab 01/31/14 1604 02/02/14 0339 02/03/14 0440  AST 28 26 31   ALT 25 20 20   ALKPHOS 143* 127* 120*  BILITOT 0.6 1.0 1.0  PROT 6.6 6.0 6.1  ALBUMIN 3.6 3.3* 3.2*   No results for input(s): LIPASE, AMYLASE in the last 168 hours. No results for input(s): AMMONIA in the last 168 hours. CBC:  Recent Labs Lab 01/31/14 1604 02/02/14 0339  WBC 5.9 10.0  NEUTROABS 5.0 8.6*  HGB 12.6 12.2  HCT 36.2 34.6*  MCV 93.3 90.6  PLT 281 234   Cardiac Enzymes: No results for input(s): CKTOTAL, CKMB, CKMBINDEX, TROPONINI in the last 168 hours. BNP (last 3 results) No results for input(s): PROBNP in the last 8760 hours. CBG: No results for input(s): GLUCAP in the last 168 hours.  Recent Results (from the past 240 hour(s))  MRSA PCR Screening     Status: None   Collection Time: 01/31/14  8:04 PM  Result Value Ref Range Status   MRSA by PCR NEGATIVE NEGATIVE Final    Comment:        The GeneXpert MRSA Assay (FDA approved for NASAL specimens only), is one component of a comprehensive MRSA colonization surveillance program. It is not intended to diagnose MRSA infection nor to guide or monitor treatment for MRSA infections.      Studies:  Recent x-ray  studies have been reviewed in detail by the Attending Physician  Scheduled Meds:  Scheduled Meds: . atorvastatin  80 mg Oral q morning - 10a  . cholecalciferol  1,000 Units Oral BID  . clopidogrel  75 mg Oral Daily  . enoxaparin (LOVENOX) injection  40 mg Subcutaneous Q24H  . multivitamin with minerals  1 tablet Oral Daily  . pantoprazole  40 mg Oral Daily  . senna-docusate  1 tablet Oral BID  . vitamin B-12  1,000 mcg Oral Daily    Time spent on care of this patient: 40 mins   Allie Bossier  [facility]  [facility] Office  [phone] Pager - [phone]  On-Call/Text Page:      [email or web address]      password [email or web address]  If 7PM-7AM, please contact night-coverage [email or web address] Password [email or web address] 02/04/2014, 1:41 PM   LOS: 4 days   Care during the described time interval was provided by me . I have reviewed this patient's available data, including medical history, events of note, physical examination, radiology studies and test results as part of my evaluation  Dia Crawford, MD [phone] Pager

## 2014-02-04 NOTE — Progress Notes (Signed)
Rehab admissions - Per neuro, patient not ready for inpatient rehab today.  I will follow up again on Monday for progress.  I have sent updated clinicals to [facility] today.  Call me for questions.  #[phone]

## 2014-02-04 NOTE — Progress Notes (Signed)
Utilization review completed.  

## 2014-02-05 ENCOUNTER — Inpatient Hospital Stay (HOSPITAL_COMMUNITY): Payer: Medicare Other

## 2014-02-05 ENCOUNTER — Encounter (HOSPITAL_COMMUNITY): Payer: Self-pay | Admitting: Radiology

## 2014-02-05 DIAGNOSIS — G911 Obstructive hydrocephalus: Secondary | ICD-10-CM

## 2014-02-05 DIAGNOSIS — I63542 Cerebral infarction due to unspecified occlusion or stenosis of left cerebellar artery: Secondary | ICD-10-CM

## 2014-02-05 DIAGNOSIS — E876 Hypokalemia: Secondary | ICD-10-CM

## 2014-02-05 DIAGNOSIS — E46 Unspecified protein-calorie malnutrition: Secondary | ICD-10-CM | POA: Diagnosis present

## 2014-02-05 LAB — MAGNESIUM: Magnesium: 1.8 mg/dL (ref 1.5–2.5)

## 2014-02-05 LAB — PROTIME-INR
INR: 1.03 (ref 0.00–1.49)
Prothrombin Time: 13.6 seconds (ref 11.6–15.2)

## 2014-02-05 LAB — COMPREHENSIVE METABOLIC PANEL
ALBUMIN: 2.8 g/dL — AB (ref 3.5–5.2)
ALT: 17 U/L (ref 0–35)
AST: 28 U/L (ref 0–37)
Alkaline Phosphatase: 101 U/L (ref 39–117)
Anion gap: 5 (ref 5–15)
BUN: 11 mg/dL (ref 6–23)
CO2: 26 mmol/L (ref 19–32)
Calcium: 8.4 mg/dL (ref 8.4–10.5)
Chloride: 103 mmol/L (ref 96–112)
Creatinine, Ser: 0.82 mg/dL (ref 0.50–1.10)
GFR calc non Af Amer: 66 mL/min — ABNORMAL LOW (ref >90)
GFR, EST AFRICAN AMERICAN: 77 mL/min — AB (ref >90)
GLUCOSE: 87 mg/dL (ref 70–99)
POTASSIUM: 3.2 mmol/L — AB (ref 3.5–5.1)
SODIUM: 134 mmol/L — AB (ref 135–145)
Total Bilirubin: 0.7 mg/dL (ref 0.3–1.2)
Total Protein: 5.2 g/dL — ABNORMAL LOW (ref 6.0–8.3)

## 2014-02-05 LAB — SODIUM: SODIUM: 135 mmol/L (ref 135–145)

## 2014-02-05 LAB — CBC
HEMATOCRIT: 32.8 % — AB (ref 36.0–46.0)
Hemoglobin: 11.9 g/dL — ABNORMAL LOW (ref 12.0–15.0)
MCH: 32.4 pg (ref 26.0–34.0)
MCHC: 36.3 g/dL — ABNORMAL HIGH (ref 30.0–36.0)
MCV: 89.4 fL (ref 78.0–100.0)
Platelets: 213 10*3/uL (ref 150–400)
RBC: 3.67 MIL/uL — ABNORMAL LOW (ref 3.87–5.11)
RDW: 11.8 % (ref 11.5–15.5)
WBC: 5.5 10*3/uL (ref 4.0–10.5)

## 2014-02-05 MED ORDER — POTASSIUM CHLORIDE 10 MEQ/100ML IV SOLN
10.0000 meq | INTRAVENOUS | Status: AC
  Administered 2014-02-05 (×2): 10 meq via INTRAVENOUS
  Filled 2014-02-05 (×2): qty 100

## 2014-02-05 MED ORDER — SODIUM CHLORIDE 3 % IV SOLN
INTRAVENOUS | Status: DC
  Administered 2014-02-05: 60 mL/h via INTRAVENOUS
  Administered 2014-02-06 (×2): 75 mL/h via INTRAVENOUS
  Administered 2014-02-06: 60 mL/h via INTRAVENOUS
  Administered 2014-02-07 (×2): 75 mL/h via INTRAVENOUS
  Administered 2014-02-08: 37.5 mL/h via INTRAVENOUS
  Administered 2014-02-08: 75 mL/h via INTRAVENOUS
  Filled 2014-02-05 (×19): qty 500

## 2014-02-05 MED ORDER — SODIUM CHLORIDE 0.9 % IV SOLN
12.5000 mg | Freq: Three times a day (TID) | INTRAVENOUS | Status: DC
  Administered 2014-02-05 – 2014-02-09 (×11): 12.5 mg via INTRAVENOUS
  Filled 2014-02-05 (×18): qty 0.5

## 2014-02-05 NOTE — Consult Note (Signed)
CC:  Chief Complaint  Patient presents with  . Dizziness    HPI: [name] is a 81 y.o. female admitted after sudden onset of vertigo, N/V and imbalance on 1/26. She was found to have a left PICA territory stroke. She has been observed and found to be slightly more lethargic over the past 24-48 hours with repeat CT demonstrating worsening edema and early ventriculomegaly.  Currently, the patient states her HA is slightly better than yesterday. She has no new c/o vision changes or N/T/W.  PMH: Past Medical History  Diagnosis Date  . Hypertension   . Hyperlipemia   . Dementia   . Cerebral hemorrhage, nontraumatic 2011  . CVA (cerebral infarction) 2011    left cerebellum  . Ureterocele, acquired     PSH: Past Surgical History  Procedure Laterality Date  . Abdominal hysterectomy    . Cholecystectomy      SH: History  Substance Use Topics  . Smoking status: Former Research scientist (life sciences)  . Smokeless tobacco: Not on file  . Alcohol Use: No    MEDS: Prior to Admission medications   Medication Sig Start Date End Date Taking? Authorizing Provider  amLODipine-olmesartan (AZOR) 5-40 MG per tablet Take 1 tablet by mouth daily.   Yes Historical Provider, MD  aspirin EC 81 MG tablet Take 81 mg by mouth every morning.   Yes Historical Provider, MD  atorvastatin (LIPITOR) 80 MG tablet Take 80 mg by mouth every morning.   Yes Historical Provider, MD  cholecalciferol (VITAMIN D) 1000 UNITS tablet Take 1,000 Units by mouth 2 (two) times daily.   Yes Historical Provider, MD  Multiple Vitamin (MULTIVITAMIN WITH MINERALS) TABS Take 1 tablet by mouth daily.   Yes Historical Provider, MD  omeprazole (PRILOSEC) 40 MG capsule Take 40 mg by mouth every morning.   Yes Historical Provider, MD  oxyCODONE-acetaminophen (PERCOCET/ROXICET) 5-325 MG per tablet Take 1 tablet by mouth every 6 (six) hours as needed for moderate pain or severe pain. 01/17/14  Yes Dene Gentry, MD  vitamin B-12 (CYANOCOBALAMIN) 1000  MCG tablet Take 1,000 mcg by mouth daily.   Yes Historical Provider, MD    ALLERGY: Allergies  Allergen Reactions  . Penicillins     Reaction: unknown  . Latex Rash    ROS: ROS  NEUROLOGIC EXAM: Awake, alert, oriented Memory and concentration grossly intact Speech fluent, appropriate CN grossly intact Motor exam: Upper Extremities Deltoid Bicep Tricep Grip  Right 5/5 5/5 5/5 5/5  Left 5/5 5/5 5/5 5/5   Lower Extremity IP Quad PF DF EHL  Right 5/5 5/5 5/5 5/5 5/5  Left 5/5 5/5 5/5 5/5 5/5    IMGAING: CTH reviewed demonstrating a left PICA territory stroke with surrounding edema and effacement of the 4th ventricle. There is slight enlargement of the 3rd/lateral ventricles in comparison to prior CT.  IMPRESSION: - 81 y.o. female post-stroke d#5 with subtle decrease in LOC and worsening edema/ventriculomegaly on CT. Although there is concern for HCP, brain edema should begin to improve rather than worsen over the next few days. Her exam is still quite good and therefore I do not believe she requires ventricular drainage at this time.  PLAN: - Transfer to 29M for close neurologic observation - Placement of CVC for 3% Hypertonic saline at 1cc/kg, Q8hrs Na monitoring - If neurologic exam continues to worsen despite medical treatment, we may then place IVC.  This plan was discussed with the patient and her family, as well as Dr. Erlinda Hong from stroke  neurology. All the patient's and her family's questions were answered.

## 2014-02-05 NOTE — Procedures (Signed)
Central Venous Catheter Insertion Procedure Note [name] [id number] January 30, 1933  Procedure: Insertion of Central Venous Catheter Indications: Drug and/or fluid administration  Procedure Details Consent: Risks of procedure as well as the alternatives and risks of each were explained to the (patient/caregiver).  Consent for procedure obtained. Time Out: Verified patient identification, verified procedure, site/side was marked, verified correct patient position, special equipment/implants available, medications/allergies/relevent history reviewed, required imaging and test results available.  Performed  Maximum sterile technique was used including antiseptics, cap, gloves, gown, hand hygiene, mask and sheet. Skin prep: Chlorhexidine; local anesthetic administered A antimicrobial bonded/coated triple lumen catheter was placed in the left internal jugular vein using the Seldinger technique.  Evaluation Blood flow good Complications: No apparent complications Patient did tolerate procedure well. Chest X-ray ordered to verify placement.  CXR: pending.  Performed using ultrasound guidance.  Wire visualized in vessel under ultrasound.    Tammy Madrid, NP 02/05/2014  4:17 PM Pager: [phone] or [phone]   Merton Border, MD ; [facility] service Mobile [phone].  After 5:30 PM or weekends, call [phone]

## 2014-02-05 NOTE — Progress Notes (Signed)
Patient to transfer to 3M05 report given to receiving nurse all questions answered at this time.

## 2014-02-05 NOTE — Progress Notes (Addendum)
STROKE TEAM PROGRESS NOTE   HISTORY [name] is an 81 y.o. female history of hypertension, hyperlipidemia, dementia, nontraumatic cerebral hemorrhage and ureterocele, presenting with new onset vertigo with nausea and vomiting as well as unstable gait. Patient was last known well at 10 PM last night 01/30/2014 (LKW). She noticed symptoms when she attempted to sit on the side of her bed at 4 AM. She's had no change in speech. No focal weakness has been noted. She said no visual changes. She's been taking aspirin daily. CT scan of her head showed nonhemorrhagic infarction involving a large portion of the left cerebral hemisphere inferiorly as well as a small portion of the right cerebellar hemisphere inferomedially. There was slight mass effect on fourth ventricle. NIH stroke score was 0. Patient was not administered TPA secondary to beyond time window for treatment consideration. She was admitted for further evaluation and treatment.   SUBJECTIVE (INTERVAL HISTORY) Her daughter and son are at the bedside.  She continues to be nauseous but has not had any vomiting. Daughter stated that she asked more tylenol yesterday for headache but pt stated the headache comes and goes as before. She remains sleepy but pt denies worsening than yesterday. Repeat CT showed sign of hydrocephalus. NSG will be consulted for consideration of need of EVD placement.    OBJECTIVE Temp:  [97.8 F (36.6 C)-98.6 F (37 C)] 98.1 F (36.7 C) (01/30 0900) Pulse Rate:  [66-85] 81 (01/30 0422) Cardiac Rhythm:  [-] Normal sinus rhythm (01/30 0422) Resp:  [13-17] 17 (01/30 0422) BP: (118-171)/(62-73) 171/72 mmHg (01/30 0422) SpO2:  [97 %-100 %] 100 % (01/30 0422)  No results for input(s): GLUCAP in the last 168 hours.  Recent Labs Lab 01/31/14 1604 02/02/14 0339 02/03/14 0440 02/05/14 0255  NA 135 130* 132* 134*  K 4.2 3.9 3.7 3.2*  CL 103 99 98 103  CO2 25 22 26 26   GLUCOSE 122* 109* 93 87  BUN 8 8 11 11    CREATININE 0.92 0.93 0.84 0.82  CALCIUM 9.0 8.5 8.6 8.4  MG  --  1.7  --  1.8    Recent Labs Lab 01/31/14 1604 02/02/14 0339 02/03/14 0440 02/05/14 0255  AST 28 26 31 28   ALT 25 20 20 17   ALKPHOS 143* 127* 120* 101  BILITOT 0.6 1.0 1.0 0.7  PROT 6.6 6.0 6.1 5.2*  ALBUMIN 3.6 3.3* 3.2* 2.8*    Recent Labs Lab 01/31/14 1604 02/02/14 0339 02/05/14 0255  WBC 5.9 10.0 5.5  NEUTROABS 5.0 8.6*  --   HGB 12.6 12.2 11.9*  HCT 36.2 34.6* 32.8*  MCV 93.3 90.6 89.4  PLT 281 234 213   No results for input(s): CKTOTAL, CKMB, CKMBINDEX, TROPONINI in the last 168 hours. No results for input(s): LABPROT, INR in the last 72 hours. No results for input(s): COLORURINE, LABSPEC, Maxwell, GLUCOSEU, HGBUR, BILIRUBINUR, KETONESUR, PROTEINUR, UROBILINOGEN, NITRITE, LEUKOCYTESUR in the last 72 hours.  Invalid input(s): APPERANCEUR     Component Value Date/Time   CHOL 126 02/01/2014 0310   TRIG 46 02/01/2014 0310   HDL 46 02/01/2014 0310   CHOLHDL 2.7 02/01/2014 0310   VLDL 9 02/01/2014 0310   LDLCALC 71 02/01/2014 0310   Lab Results  Component Value Date   HGBA1C 5.6 02/01/2014   No results found for: LABOPIA, COCAINSCRNUR, LABBENZ, AMPHETMU, THCU, LABBARB  No results for input(s): ETH in the last 168 hours.  I have personally reviewed the radiological images below and agree with the radiology interpretations.  Ct Angio Head and neck W/cm &/or Wo Cm  02/03/2014   IMPRESSION: Acute infarct in the left inferior cerebellum in the PICA territory shows progressive edema and swelling with mass effect on the fourth ventricle. No obstructive hydrocephalus or hemorrhage identified. Left PICA is now patent. Both vertebral arteries are widely patent.  Bilateral carotid atherosclerotic disease. Ulcerated plaque involving the right common carotid artery. No significant internal carotid artery stenosis. Moderately severe stenosis proximal right external carotid artery.  2.2 mm aneurysm left  posterior communicating artery origin.   Electronically Signed   By: Franchot Gallo M.D.   On: 02/03/2014 07:34   Dg Chest 2 View  01/31/2014    IMPRESSION: No acute cardiopulmonary disease. Lower lung volumes than on prior with basilar atelectasis.      Ct Head Wo Contrast  02/05/2014   IMPRESSION: Evolving LEFT posterior inferior cerebellar artery territory infarct with worsening edema, progressive fourth ventricle and cerebral aqueduct effacement resulting in early obstructive hydrocephalus.  Worsening sphenoid sinusitis.      02/02/2014    IMPRESSION: Evolving LEFT posterior inferior cerebellar artery territory infarct with worsening edema, partial effacement of the fourth ventricle without obstructive hydrocephalus. No hemorrhagic conversion.      01/31/2014   IMPRESSION: Subacute nonhemorrhagic infarction involving a large portion of the left cerebellar hemisphere inferiorly as well as a small portion of the right cerebellar hemisphere inferomedially. Minimal mass effect on the fourth ventricle.  Moderate atrophy and microvascular disease in the cerebral hemispheres.  Sphenoid sinus disease bilaterally.     Mri and Mra Brain Wo Contrast  02/01/2014   IMPRESSION: MRI HEAD: Acute LEFT posterior inferior cerebellar artery territory infarct with petechial hemorrhage.  Scattered foci of susceptibility artifact in a pattern suggesting sequelae of chronic hypertension.  Acute sphenoid sinusitis.  MRA HEAD: Thready irregular LEFT posterior-inferior cerebellar artery, this could reflect atherosclerosis or mass effect from acute infarct.  Mid grade stenosis of RIGHT P2 segment with luminal irregularity of the bilateral posterior cerebral arteries consistent with atherosclerosis.     Carotid Doppler  There is 1-39% bilateral ICA stenosis. Vertebral artery flow is antegrade.    2D echo - Left ventricle: The cavity size was normal. Wall thickness was normal. Systolic function was normal. The estimated  ejection fraction was in the range of 55% to 60%. Wall motion was normal; there were no regional wall motion abnormalities. Doppler parameters are consistent with abnormal left ventricular relaxation (grade 1 diastolic dysfunction).  Impressions: - Normal LV function; grade 1 diastolic dysfunction; no significant valvular regurgitation noted.  Component     Latest Ref Rng 02/01/2014  Cholesterol     0 - 200 mg/dL 126  Triglycerides     <150 mg/dL 46  HDL     >39 mg/dL 46  Total CHOL/HDL Ratio      2.7  VLDL     0 - 40 mg/dL 9  LDL (calc)     0 - 99 mg/dL 71  Hgb A1c MFr Bld     <5.7 % 5.6  Mean Plasma Glucose     <117 mg/dL 114    PHYSICAL EXAM  Temp:  [97.8 F (36.6 C)-98.6 F (37 C)] 98.1 F (36.7 C) (01/30 0900) Pulse Rate:  [66-85] 81 (01/30 0422) Resp:  [13-17] 17 (01/30 0422) BP: (118-171)/(62-73) 171/72 mmHg (01/30 0422) SpO2:  [97 %-100 %] 100 % (01/30 0422)  General - Well nourished, well developed, sleepy but following commands, headache and nausea.  Ophthalmologic - not  cooperate due to photophobia.  Cardiovascular - Regular rate and rhythm with no murmur.  Mental Status -  Level of arousal and orientation to time, place, and person were intact. Language including expression, naming, repetition, comprehension was assessed and found intact. Following commands.  Cranial Nerves II - XII - II - Visual field intact OU. III, IV, VI - Extraocular movements intact, but with bilateral nystagmus. V - Facial sensation intact bilaterally. VII - Facial movement intact bilaterally. VIII - Hearing & vestibular intact bilaterally, but with bilateral nystagmus. X - Palate elevates symmetrically. XI - Chin turning & shoulder shrug intact bilaterally. XII - Tongue protrusion intact.  Motor Strength - The patient's strength was normal in all extremities and pronator drift was absent.  Bulk was normal and fasciculations were absent.   Motor Tone - Muscle  tone was assessed at the neck and appendages and was normal.  Reflexes - The patient's reflexes were 1+ in all extremities and she had no pathological reflexes.  Sensory - Light touch, temperature/pinprick, vibration and proprioception, and Romberg testing were assessed and were normal.    Coordination - The patient had normal movements in the hands with no ataxia or dysmetria, although slow on the left.  Tremor was absent.  Gait and Station - not tested.   ASSESSMENT/PLAN Ms. Madilynne Mullan is a 81 y.o. female with history of hypertension, hyperlipidemia, dementia, nontraumatic cerebral hemorrhage and ureterocele presenting with new onset nausea and vomiting as well as dizziness and unstable gait. She did not receive IV t-PA due to delay in arrival.   Stroke:  left PICA infarct with petechial hemorrhage c/w chronic hypertension, felt to be embolic (? Afib) vs. Large vessel athero, at risk for neuro worsening given subtentorial location  Resultant  No focal deficit  MRI  L PICA infarct  MRA  Thready L PICA, R P2 high grade stenosis  CTA head and neck showed left VA athero and bilateral ICA athero  Repeat CT showed hydrocephalus -  Consult NSG to consider EVD placement  Carotid Doppler  No significant stenosis   2D Echo  unremarkable   HgbA1c 5.6  Lovenox 40 mg sq daily for VTE prophylaxis  Diet regular thin liquids  aspirin 81 mg orally every day prior to admission, now on clopidogrel 75 mg orally every day  Ongoing aggressive stroke risk factor management  No further embolic workup as pt not an anticoagulation candidate due to fall risk, dementia  Therapy recommendations:  CIR   Disposition:  Pending CIR.  Hydrocephalus - series CT showed developing hydrocephalus - pt has worsening HA yesterdy - still follow up commands and neuro stable at this time - consult NSG to consider EVD  - transfer to neuro ICU for close monitoring  Hypertension  Home meds -  amlodipine Permissive hypertension (OK if <220/120) for 24-48 hours post stroke and then gradually normalized within 5-7 days.  Stable, but increasing BP likely due to increased ICP  Hyperlipidemia  Home meds:  lipitor 80 resumed in hospital  LDL 71, goal < 70  Continue statin at discharge  Malnutrition  No good intake for several days  Nausea not able to eat  Aspiration precaution  Consult nutrition to consider TPN if needed.  Other Stroke Risk Factors  Advanced age  Hx stroke/TIA - hx small L cerebellar infarct 2011; progression L frontal micro hemorrhages since 2009 (? Amyloid)  Family hx stroke (mother)  Other Active Problems  Baseline dementia - ? amyloid  Collapsed vertebrae, requires  help with ADLs, recently staying with daughter who helps provide care  Hospital day # 5  This patient is critically ill due to cerebellar stroke and hydrocephalus and at significant risk of neurological worsening, death form cerebral edema, obstructive hydrocephalus and brain herniation, and hemorrhagic conversion. This patient's care requires constant monitoring of vital signs, hemodynamics, respiratory and cardiac monitoring, review of multiple databases, neurological assessment, discussion with family, other specialists and medical decision making of high complexity. I spent 45 minutes of neurocritical care time in the care of this patient.  Rosalin Hawking, MD PhD Stroke Neurology 02/05/2014 10:05 AM    To contact Stroke Continuity provider, please refer to [email or web address] After hours, contact General Neurology

## 2014-02-05 NOTE — Progress Notes (Addendum)
[place] TEAM 1 - Stepdown/ICU TEAM Progress Note  [name] UGQ:[id number] DOB: 1933-11-02 DOA: 01/31/2014 PCP: Abigail Miyamoto, MD  Admit HPI / Brief Narrative: [name] is an 81 y.o. WF PMHx hypertension, hyperlipidemia, dementia, nontraumatic cerebral hemorrhage and ureterocele. Presented with new onset vertigo with nausea and vomiting as well as unstable gait. Patient was last known well at 10 PM last night 01/30/2014 ([facility]). She noticed symptoms when she attempted to sit on the side of her bed at 4 AM. She's had no change in speech. No focal weakness has been noted. She said no visual changes. She's been taking aspirin daily. CT scan of her head showed nonhemorrhagic infarction involving a large portion of the left cerebral hemisphere inferiorly as well as a small portion of the right cerebellar hemisphere inferomedially. There was slight mass effect on fourth ventricle. NIH stroke score was 0. Patient was not administered TPA secondary to beyond time window for treatment consideration. She was admitted for further evaluation and treatment.    HPI/Subjective: 1/30 sleepy but arousable A/O 4, still complaining of constant refractory nausea which is somewhat relieved with the Thorazine. Patient's daughter states she was able to consume approximately 50% of her dinner last night. Negative CP, negative SOB, negative headache.   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 Consuella Lose (neurosurgery), who will evaluate patient for possible shunt vs watchful waiting for patients increasing pressures by CT scan  Increased cerebral pressure -See acute CVA -Will await Dr. Consuella Lose (neurosurgery),  recommendations.,  Hypertension -Continue hydralazine 10 mg  TID PRN SBP > 190  Hyperlipidemia -Lipid panel within AHA guidelines - Continue Lipitor 80 mg daily   Other Stroke Risk Factors -Advanced age Hx stroke/TIA - hx small L cerebellar infarct 2011; - progression L frontal micro hemorrhages since 2009 (? Amyloid) -Family hx stroke (mother)  Refractory nausea -Patient has had multiple doses of Zofran without decreased in symptoms of nausea. -Continue Thorazine 12.5 mg TID 30 min prior to meals; may increase to 25 mg TID if does not drop patient's BP.  -Phenergan found to make patient extremely sleepy and unable to participate during the day with physical therapy DC  Dementia -At baseline  Hypokalemia -Potassium 10 mEq 3  Malnutrition -Patient and family agrees that NG tube will be inserted in the a.m. if patient unable to consume at least 50% of her meals -Consult to dietitian   Code Status: FULL Family Communication: Spoke with entire family for approximately 45 minutes on goals of care.   Disposition Plan: CIR vs SNF     Consultants: Dr.Pramod Sethi (neurology stroke team) Dr. Consuella Lose (neurosurgery)    Procedure/Significant Events: 1/25 CT head without contrast;- wedge-shaped low-attenuation involving the inferior left cerebellar hemisphere.- smaller focus of low attenuation in the medial inferior right cerebellar hemisphere.  -likely represent subacute nonhemorrhagic infarctions.  -mild to moderate chronic microvascular ischemic change in the deep white matter. 1/26 MRA/MRI brain;MRA HEAD FINDINGS - Patent anterior communicating artery.  -No large vessel occlusion, high-grade stenosis, aneurysm. -Posterior circulation: Mid grade stenosis of RIGHT P 2 segment, with compensatory diminutive RIGHT P1 segment, robust  -No large vessel occlusion, high-grade stenosis, aneurysm.  MRI HEAD:- Acute LEFT posterior inferior cerebellar artery territory  infarct with petechial hemorrhage. -  Scattered foci of susceptibility artifact in a pattern suggesting sequelae of chronic hypertension. -Thready irregular LEFT posterior-inferior cerebellar artery, this could reflect atherosclerosis or mass effect from acute infarct. 1/26 carotid duplex; Bilateral carotid artery duplex completed: 1-39% ICA stenosis. Vertebral artery flow is antegrade.  1/26 echocardiogram;- LVEF= 55% to 60%. - (grade 1 diastolic dysfunction). 1/30 CT head without contrast; Evolving LEFT posterior inferior cerebellar artery territory infarct with worsening edema, progressive fourth ventricle and cerebral aqueduct effacement resulting in early obstructive hydrocephalus. -Worsening sphenoid sinusitis.    Culture NA  Antibiotics: NA  DVT prophylaxis: SCD   Devices NA   LINES / TUBES:      Continuous Infusions: . sodium chloride 60 mL/hr at 02/04/14 2219    Objective: VITAL SIGNS: Temp: 98.1 F (36.7 C) (01/30 0900) Temp Source: Oral (01/30 0900) BP: 171/72 mmHg (01/30 0422) Pulse Rate: 81 (01/30 0422) SPO2; FIO2:   Intake/Output Summary (Last 24 hours) at 02/05/14 1000 Last data filed at 02/05/14 0700  Gross per 24 hour  Intake   2125 ml  Output   1050 ml  Net   1075 ml     Exam: General: Sleepy but arousable A/O 4, No acute respiratory distress Lungs: Clear to auscultation bilaterally without wheezes or crackles Cardiovascular: Regular rate and rhythm without murmur gallop or rub normal S1 and S2 Abdomen: Nontender, nondistended, soft, bowel sounds positive, no rebound, no ascites, no appreciable mass Extremities: No significant cyanosis, clubbing, or edema bilateral lower extremities Neurologic; cranial nerves II through XII intact, tongue/uvula midline, extremity strength 5/5, extremity sensation intact throughout, finger nose finger within normal limits bilateral (slower to complete task than on previous days), quick finger touch  bilateral within normal limits (slower to complete task than on previous days),       Data Reviewed: Basic Metabolic Panel:  Recent Labs Lab 01/31/14 1604 02/02/14 0339 02/03/14 0440 02/05/14 0255  NA 135 130* 132* 134*  K 4.2 3.9 3.7 3.2*  CL 103 99 98 103  CO2 25 22 26 26   GLUCOSE 122* 109* 93 87  BUN 8 8 11 11   CREATININE 0.92 0.93 0.84 0.82  CALCIUM 9.0 8.5 8.6 8.4  MG  --  1.7  --  1.8   Liver Function Tests:  Recent Labs Lab 01/31/14 1604 02/02/14 0339 02/03/14 0440 02/05/14 0255  AST 28 26 31 28   ALT 25 20 20 17   ALKPHOS 143* 127* 120* 101  BILITOT 0.6 1.0 1.0 0.7  PROT 6.6 6.0 6.1 5.2*  ALBUMIN 3.6 3.3* 3.2* 2.8*   No results for input(s): LIPASE, AMYLASE in the last 168 hours. No results for input(s): AMMONIA in the last 168 hours. CBC:  Recent Labs Lab 01/31/14 1604 02/02/14 0339 02/05/14 0255  WBC 5.9 10.0 5.5  NEUTROABS 5.0 8.6*  --   HGB 12.6 12.2 11.9*  HCT 36.2 34.6* 32.8*  MCV 93.3 90.6 89.4  PLT 281 234 213   Cardiac Enzymes: No results for input(s): CKTOTAL, CKMB, CKMBINDEX, TROPONINI in the last 168 hours. BNP (last 3 results) No results for input(s): PROBNP in the last 8760 hours. CBG: No results for input(s): GLUCAP in the last 168 hours.  Recent Results (from the past 240 hour(s))  MRSA PCR Screening     Status: None   Collection Time: 01/31/14  8:04 PM  Result Value Ref Range Status   MRSA by PCR NEGATIVE NEGATIVE Final    Comment:        The GeneXpert MRSA Assay (FDA  approved for NASAL specimens only), is one component of a comprehensive MRSA colonization surveillance program. It is not intended to diagnose MRSA infection nor to guide or monitor treatment for MRSA infections.      Studies:  Recent x-ray studies have been reviewed in detail by the Attending Physician  Scheduled Meds:  Scheduled Meds: . atorvastatin  80 mg Oral q morning - 10a  . chlorproMAZINE (THORAZINE) IV  12.5 mg Intravenous TID AC  .  cholecalciferol  1,000 Units Oral BID  . clopidogrel  75 mg Oral Daily  . enoxaparin (LOVENOX) injection  40 mg Subcutaneous Q24H  . multivitamin with minerals  1 tablet Oral Daily  . pantoprazole  40 mg Oral Daily  . senna-docusate  1 tablet Oral BID  . vitamin B-12  1,000 mcg Oral Daily    Time spent on care of this patient: 40 mins   Allie Bossier [facility]  [facility] Office  [phone] Pager - [phone]  On-Call/Text Page:      [email or web address]      password [email or web address]  If 7PM-7AM, please contact night-coverage [email or web address] Password [email or web address] 02/05/2014, 10:00 AM   LOS: 5 days   Care during the described time interval was provided by me . I have reviewed this patient's available data, including medical history, events of note, physical examination, radiology studies and test results as part of my evaluation  Dia Crawford, MD [phone] Pager

## 2014-02-05 NOTE — Progress Notes (Addendum)
INITIAL NUTRITION ASSESSMENT  DOCUMENTATION CODES Per approved criteria  -Non-severe (moderate) malnutrition in the context of acute illness or injury   INTERVENTION: Magic cup TID with meals, each supplement provides 290 kcal and 9 grams of protein  If pt requires feeding tube, consider post-pyloric if vomiting continues to be a problem,  Recommend:  Initiate Osmolite 1.2 @ 25 ml/hr via feeding tube and increase by 10 ml every 4 hours to goal rate of 55 ml/hr.   Tube feeding regimen provides 1584 kcal, 73 grams of protein, and 1082 ml of H2O.   Would recommend feeding tube for nutrition support vs TPN.   NUTRITION DIAGNOSIS: Malnutrition related to acute illness as evidenced by intake < 75% of her needs for > 7 days and mild depletion of muscle.   Goal: Pt to meet >/= 90% of their estimated nutrition needs   Monitor:  PO intake, supplement acceptance  Reason for Assessment: MD Consult  81 y.o. female  Admitting Dx: Left PICA infarct  ASSESSMENT: Pt admitted with new onset vertigo and N/V found to have acute left posterior inferior cerebellar artery territory infarct.  History obtained from family. Per family pt had a compressed vertebrae which was very painful and pt was not able to stay at home. She came to live with family and ate very poorly due to pain. She would have a 1/2 piece of toast for Breakfast, lunch would be 1/2 sandwich, and only bites at dinner. Pt does not like ensure and would not drink PTA.   Pt was able to tolerate about 1/2 of her dinner last night after receiving thorazine prior to her meal. Per family they had to force her to take food.  Plan is to provide thorazine 30 min prior to meals.   Nutrition Focused Physical Exam:  Subcutaneous Fat:  Orbital Region: WDL Upper Arm Region: WDL Thoracic and Lumbar Region: WDl  Muscle:  Temple Region: mild depletion Clavicle Bone Region: mild depletion Clavicle and Acromion Bone Region: mild depletion   Scapular Bone Region: NA Dorsal Hand: severe depletion Patellar Region: WDL Anterior Thigh Region: mild depletion Posterior Calf Region: mild depletion  Edema: not present    Height: Ht Readings from Last 1 Encounters:  01/31/14 5\' 4"  (1.626 m)    Weight: Wt Readings from Last 1 Encounters:  02/03/14 134 lb 4.2 oz (60.9 kg)    Ideal Body Weight: 54.5 kg   % Ideal Body Weight: 112%  Wt Readings from Last 10 Encounters:  02/03/14 134 lb 4.2 oz (60.9 kg)  01/17/14 135 lb (61.236 kg)  01/16/14 135 lb (61.236 kg)  07/31/11 134 lb (60.782 kg)    Usual Body Weight: 135 lb   % Usual Body Weight: 100%  BMI:  Body mass index is 23.03 kg/(m^2).  Estimated Nutritional Needs: Kcal: 1400-1600 Protein: 70-85 grams Fluid: > 1.5 L/day  Skin: intact  Diet Order: Diet regular  EDUCATION NEEDS: -No education needs identified at this time   Intake/Output Summary (Last 24 hours) at 02/05/14 1053 Last data filed at 02/05/14 0700  Gross per 24 hour  Intake   2125 ml  Output   1050 ml  Net   1075 ml    Last BM: 1/27   Labs:   Recent Labs Lab 02/02/14 0339 02/03/14 0440 02/05/14 0255  NA 130* 132* 134*  K 3.9 3.7 3.2*  CL 99 98 103  CO2 22 26 26   BUN 8 11 11   CREATININE 0.93 0.84 0.82  CALCIUM 8.5  8.6 8.4  MG 1.7  --  1.8  GLUCOSE 109* 93 87    CBG (last 3)  No results for input(s): GLUCAP in the last 72 hours.  Scheduled Meds: . atorvastatin  80 mg Oral q morning - 10a  . chlorproMAZINE (THORAZINE) IV  12.5 mg Intravenous TID AC  . cholecalciferol  1,000 Units Oral BID  . clopidogrel  75 mg Oral Daily  . enoxaparin (LOVENOX) injection  40 mg Subcutaneous Q24H  . multivitamin with minerals  1 tablet Oral Daily  . pantoprazole  40 mg Oral Daily  . senna-docusate  1 tablet Oral BID  . vitamin B-12  1,000 mcg Oral Daily    Continuous Infusions: . sodium chloride 60 mL/hr at 02/04/14 2219    Past Medical History  Diagnosis Date  . Hypertension    . Hyperlipemia   . Dementia   . Cerebral hemorrhage, nontraumatic 2011  . CVA (cerebral infarction) 2011    left cerebellum  . Ureterocele, acquired     Past Surgical History  Procedure Laterality Date  . Abdominal hysterectomy    . Cholecystectomy      [place], [place], [place] Pager [phone] After Hours Pager

## 2014-02-06 DIAGNOSIS — E440 Moderate protein-calorie malnutrition: Secondary | ICD-10-CM

## 2014-02-06 DIAGNOSIS — G911 Obstructive hydrocephalus: Secondary | ICD-10-CM | POA: Insufficient documentation

## 2014-02-06 LAB — SODIUM
SODIUM: 139 mmol/L (ref 135–145)
Sodium: 135 mmol/L (ref 135–145)
Sodium: 143 mmol/L (ref 135–145)
Sodium: 143 mmol/L (ref 135–145)

## 2014-02-06 LAB — CBC
HEMATOCRIT: 35.2 % — AB (ref 36.0–46.0)
HEMOGLOBIN: 12.6 g/dL (ref 12.0–15.0)
MCH: 32.2 pg (ref 26.0–34.0)
MCHC: 35.8 g/dL (ref 30.0–36.0)
MCV: 90 fL (ref 78.0–100.0)
Platelets: 211 10*3/uL (ref 150–400)
RBC: 3.91 MIL/uL (ref 3.87–5.11)
RDW: 12 % (ref 11.5–15.5)
WBC: 6 10*3/uL (ref 4.0–10.5)

## 2014-02-06 LAB — COMPREHENSIVE METABOLIC PANEL
ALT: 39 U/L — ABNORMAL HIGH (ref 0–35)
AST: 54 U/L — ABNORMAL HIGH (ref 0–37)
Albumin: 3 g/dL — ABNORMAL LOW (ref 3.5–5.2)
Alkaline Phosphatase: 111 U/L (ref 39–117)
Anion gap: 10 (ref 5–15)
BILIRUBIN TOTAL: 1 mg/dL (ref 0.3–1.2)
BUN: 6 mg/dL (ref 6–23)
CHLORIDE: 103 mmol/L (ref 96–112)
CO2: 24 mmol/L (ref 19–32)
CREATININE: 0.82 mg/dL (ref 0.50–1.10)
Calcium: 8.3 mg/dL — ABNORMAL LOW (ref 8.4–10.5)
GFR, EST AFRICAN AMERICAN: 77 mL/min — AB (ref >90)
GFR, EST NON AFRICAN AMERICAN: 66 mL/min — AB (ref >90)
GLUCOSE: 97 mg/dL (ref 70–99)
Potassium: 3 mmol/L — ABNORMAL LOW (ref 3.5–5.1)
Sodium: 137 mmol/L (ref 135–145)
TOTAL PROTEIN: 5.8 g/dL — AB (ref 6.0–8.3)

## 2014-02-06 LAB — MAGNESIUM: Magnesium: 1.7 mg/dL (ref 1.5–2.5)

## 2014-02-06 MED ORDER — ENSURE PUDDING PO PUDG
1.0000 | Freq: Three times a day (TID) | ORAL | Status: DC
Start: 2014-02-06 — End: 2014-02-08

## 2014-02-06 MED ORDER — POTASSIUM CHLORIDE 10 MEQ/50ML IV SOLN
10.0000 meq | INTRAVENOUS | Status: AC
  Administered 2014-02-06 (×3): 10 meq via INTRAVENOUS
  Filled 2014-02-06 (×3): qty 50

## 2014-02-06 MED ORDER — AMLODIPINE BESYLATE 5 MG PO TABS
5.0000 mg | ORAL_TABLET | Freq: Every day | ORAL | Status: DC
  Administered 2014-02-07 – 2014-02-08 (×2): 5 mg via ORAL
  Filled 2014-02-06 (×2): qty 1

## 2014-02-06 NOTE — Progress Notes (Signed)
Pt seen and examined. No issues overnight. Pt denies HA, visual changes, or N/V this am.  EXAM: Temp:  [97.8 F (36.6 C)-98.9 F (37.2 C)] 98.3 F (36.8 C) (01/31 0400) Pulse Rate:  [71-92] 83 (01/31 0800) Resp:  [12-24] 14 (01/31 0800) BP: (89-180)/(56-83) 169/68 mmHg (01/31 0800) SpO2:  [96 %-99 %] 98 % (01/31 0800) Weight:  [58.6 kg (129 lb 3 oz)-59.2 kg (130 lb 8.2 oz)] 58.6 kg (129 lb 3 oz) (01/31 0400) Intake/Output      01/30 0701 - 01/31 0700 01/31 0701 - 02/01 0700   P.O. 180    I.V. (mL/kg) 1190 (20.3) 60 (1)   IV Piggyback  25   Total Intake(mL/kg) 1370 (23.4) 85 (1.5)   Urine (mL/kg/hr) 2950 (2.1)    Stool 0 (0)    Total Output 2950     Net -1580 +85        Stool Occurrence 2 x     Awake, alert, oriented CN grossly intact Moving all extremities well  LABS: Lab Results  Component Value Date   CREATININE 0.82 02/06/2014   BUN 6 02/06/2014   NA 137 02/06/2014   K 3.0* 02/06/2014   CL 103 02/06/2014   CO2 24 02/06/2014   Lab Results  Component Value Date   WBC 6.0 02/06/2014   HGB 12.6 02/06/2014   HCT 35.2* 02/06/2014   MCV 90.0 02/06/2014   PLT 211 02/06/2014    IMPRESSION: - 81 y.o. female post-stroke d# 6, PICA territory infarct. Remains neurologically stable to slightly improved from alertness standpoint on hypertonic saline.  PLAN: - Na only increased from 134-137 in 24 hrs. Will increase 3% to 75cc/hr - cont observation, Will likely repeat CT tomorrow or Tuesday.

## 2014-02-06 NOTE — Progress Notes (Signed)
STROKE TEAM PROGRESS NOTE   HISTORY [name] is an 81 y.o. female history of hypertension, hyperlipidemia, dementia, nontraumatic cerebral hemorrhage and ureterocele, presenting with new onset vertigo with nausea and vomiting as well as unstable gait. Patient was last known well at 10 PM last night 01/30/2014 (LKW). She noticed symptoms when she attempted to sit on the side of her bed at 4 AM. She's had no change in speech. No focal weakness has been noted. She said no visual changes. She's been taking aspirin daily. CT scan of her head showed nonhemorrhagic infarction involving a large portion of the left cerebral hemisphere inferiorly as well as a small portion of the right cerebellar hemisphere inferomedially. There was slight mass effect on fourth ventricle. NIH stroke score was 0. Patient was not administered TPA secondary to beyond time window for treatment consideration. She was admitted for further evaluation and treatment.   SUBJECTIVE (INTERVAL HISTORY) Her daughter and son are at the bedside.  Her neurologic conditions is stable. More awake alert, answered questions appropriately. she denies headache, but still nauseous. On 3% saline, however sodium still 137 this morning.  OBJECTIVE Temp:  [98.3 F (36.8 C)-98.9 F (37.2 C)] 98.5 F (36.9 C) (01/31 1530) Pulse Rate:  [71-92] 91 (01/31 1600) Cardiac Rhythm:  [-] Normal sinus rhythm (01/31 0800) Resp:  [12-21] 21 (01/31 1600) BP: (89-180)/(55-83) 140/71 mmHg (01/31 1600) SpO2:  [96 %-99 %] 97 % (01/31 1600) Weight:  [129 lb 3 oz (58.6 kg)] 129 lb 3 oz (58.6 kg) (01/31 0400)  No results for input(s): GLUCAP in the last 168 hours.  Recent Labs Lab 01/31/14 1604 02/02/14 0339 02/03/14 0440 02/05/14 0255 02/05/14 1942 02/06/14 0250 02/06/14 0440 02/06/14 1100  NA 135 130* 132* 134* 135 135 137 139  K 4.2 3.9 3.7 3.2*  --   --  3.0*  --   CL 103 99 98 103  --   --  103  --   CO2 25 22 26 26   --   --  24  --   GLUCOSE  122* 109* 93 87  --   --  97  --   BUN 8 8 11 11   --   --  6  --   CREATININE 0.92 0.93 0.84 0.82  --   --  0.82  --   CALCIUM 9.0 8.5 8.6 8.4  --   --  8.3*  --   MG  --  1.7  --  1.8  --   --  1.7  --     Recent Labs Lab 01/31/14 1604 02/02/14 0339 02/03/14 0440 02/05/14 0255 02/06/14 0440  AST 28 26 31 28  54*  ALT 25 20 20 17  39*  ALKPHOS 143* 127* 120* 101 111  BILITOT 0.6 1.0 1.0 0.7 1.0  PROT 6.6 6.0 6.1 5.2* 5.8*  ALBUMIN 3.6 3.3* 3.2* 2.8* 3.0*    Recent Labs Lab 01/31/14 1604 02/02/14 0339 02/05/14 0255 02/06/14 0440  WBC 5.9 10.0 5.5 6.0  NEUTROABS 5.0 8.6*  --   --   HGB 12.6 12.2 11.9* 12.6  HCT 36.2 34.6* 32.8* 35.2*  MCV 93.3 90.6 89.4 90.0  PLT 281 234 213 211   No results for input(s): CKTOTAL, CKMB, CKMBINDEX, TROPONINI in the last 168 hours. No results for input(s): LABPROT, INR in the last 72 hours. No results for input(s): COLORURINE, LABSPEC, [place], GLUCOSEU, HGBUR, BILIRUBINUR, KETONESUR, PROTEINUR, UROBILINOGEN, NITRITE, LEUKOCYTESUR in the last 72 hours.  Invalid input(s): APPERANCEUR  Component Value Date/Time   CHOL 126 02/01/2014 0310   TRIG 46 02/01/2014 0310   HDL 46 02/01/2014 0310   CHOLHDL 2.7 02/01/2014 0310   VLDL 9 02/01/2014 0310   LDLCALC 71 02/01/2014 0310   Lab Results  Component Value Date   HGBA1C 5.6 02/01/2014   No results found for: LABOPIA, COCAINSCRNUR, LABBENZ, AMPHETMU, THCU, LABBARB  No results for input(s): ETH in the last 168 hours.  I have personally reviewed the radiological images below and agree with the radiology interpretations.  Ct Angio Head and neck W/cm &/or Wo Cm  02/03/2014   IMPRESSION: Acute infarct in the left inferior cerebellum in the PICA territory shows progressive edema and swelling with mass effect on the fourth ventricle. No obstructive hydrocephalus or hemorrhage identified. Left PICA is now patent. Both vertebral arteries are widely patent.  Bilateral carotid atherosclerotic  disease. Ulcerated plaque involving the right common carotid artery. No significant internal carotid artery stenosis. Moderately severe stenosis proximal right external carotid artery.  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   Electronically Signed   By: Franchot Gallo M.D.   On: 02/03/2014 07:34   Dg Chest 2 View  01/31/2014    IMPRESSION: No acute cardiopulmonary disease. Lower lung volumes than on prior with basilar atelectasis.      Ct Head Wo Contrast  02/05/2014   IMPRESSION: Evolving LEFT posterior inferior cerebellar artery territory infarct with worsening edema, progressive fourth ventricle and cerebral aqueduct effacement resulting in early obstructive hydrocephalus.  Worsening sphenoid sinusitis.      02/02/2014    IMPRESSION: Evolving LEFT posterior inferior cerebellar artery territory infarct with worsening edema, partial effacement of the fourth ventricle without obstructive hydrocephalus. No hemorrhagic conversion.      01/31/2014   IMPRESSION: Subacute nonhemorrhagic infarction involving a large portion of the left cerebellar hemisphere inferiorly as well as a small portion of the right cerebellar hemisphere inferomedially. Minimal mass effect on the fourth ventricle.  Moderate atrophy and microvascular disease in the cerebral hemispheres.  Sphenoid sinus disease bilaterally.     Mri and Mra Brain Wo Contrast  02/01/2014   IMPRESSION: MRI HEAD: Acute LEFT posterior inferior cerebellar artery territory infarct with petechial hemorrhage.  Scattered foci of susceptibility artifact in a pattern suggesting sequelae of chronic hypertension.  Acute sphenoid sinusitis.  MRA HEAD: Thready irregular LEFT posterior-inferior cerebellar artery, this could reflect atherosclerosis or mass effect from acute infarct.  Mid grade stenosis of RIGHT P2 segment with luminal irregularity of the bilateral posterior cerebral arteries consistent with atherosclerosis.     Carotid Doppler   There is 1-39% bilateral ICA stenosis. Vertebral artery flow is antegrade.    2D echo - Left ventricle: The cavity size was normal. Wall thickness was normal. Systolic function was normal. The estimated ejection fraction was in the range of 55% to 60%. Wall motion was normal; there were no regional wall motion abnormalities. Doppler parameters are consistent with abnormal left ventricular relaxation (grade 1 diastolic dysfunction).  Impressions: - Normal LV function; grade 1 diastolic dysfunction; no significant valvular regurgitation noted.  Component     Latest Ref Rng 02/01/2014  Cholesterol     0 - 200 mg/dL 126  Triglycerides     <150 mg/dL 46  HDL     >39 mg/dL 46  Total CHOL/HDL Ratio      2.7  VLDL     0 - 40 mg/dL 9  LDL (calc)     0 - 99 mg/dL  71  Hgb A1c MFr Bld     <5.7 % 5.6  Mean Plasma Glucose     <117 mg/dL 114    PHYSICAL EXAM  Temp:  [98.3 F (36.8 C)-98.9 F (37.2 C)] 98.5 F (36.9 C) (01/31 1530) Pulse Rate:  [71-92] 91 (01/31 1600) Resp:  [12-21] 21 (01/31 1600) BP: (89-180)/(55-83) 140/71 mmHg (01/31 1600) SpO2:  [96 %-99 %] 97 % (01/31 1600) Weight:  [129 lb 3 oz (58.6 kg)] 129 lb 3 oz (58.6 kg) (01/31 0400)  General - Well nourished, well developed, not in acute distress.  Ophthalmologic - not cooperate due to photophobia.  Cardiovascular - Regular rate and rhythm with no murmur.  Mental Status -  Level of arousal and orientation to time, place, and person were intact. Language including expression, naming, repetition, comprehension was assessed and found intact. Following commands.  Cranial Nerves II - XII - II - Visual field intact OU. III, IV, VI - Extraocular movements intact, but with bilateral nystagmus. V - Facial sensation intact bilaterally. VII - Facial movement intact bilaterally. VIII - Hearing & vestibular intact bilaterally, but with bilateral nystagmus. X - Palate elevates symmetrically. XI - Chin turning  & shoulder shrug intact bilaterally. XII - Tongue protrusion intact.  Motor Strength - The patient's strength was normal in all extremities and pronator drift was absent.  Bulk was normal and fasciculations were absent.   Motor Tone - Muscle tone was assessed at the neck and appendages and was normal.  Reflexes - The patient's reflexes were 1+ in all extremities and she had no pathological reflexes.  Sensory - Light touch, temperature/pinprick, vibration and proprioception, and Romberg testing were assessed and were normal.    Coordination - The patient had mild dysmetria on the left with finger to nose testing.  Tremor was absent.  Gait and Station - not tested.   ASSESSMENT/PLAN [name] is a 81 y.o. female with history of hypertension, hyperlipidemia, dementia, nontraumatic cerebral hemorrhage and ureterocele presenting with new onset nausea and vomiting as well as dizziness and unstable gait. She did not receive IV t-PA due to delay in arrival.   Stroke:  left PICA infarct with petechial hemorrhage c/w chronic hypertension, felt to be embolic (? Afib) vs. Large vessel athero, at risk for neuro worsening given subtentorial location  Resultant  No focal deficit  MRI  L PICA infarct  MRA  Thready L PICA, R P2 high grade stenosis  CTA head and neck showed left VA athero and bilateral ICA athero  Repeat CT showed hydrocephalus -  Continue close observation, on 3% saline, EVD placement if necessary  Carotid Doppler  No significant stenosis   2D Echo  unremarkable   HgbA1c 5.6  Lovenox 40 mg sq daily for VTE prophylaxis  Diet regular thin liquids  aspirin 81 mg orally every day prior to admission, now on clopidogrel 75 mg orally every day  Ongoing aggressive stroke risk factor management  No further embolic workup as pt not an anticoagulation candidate due to fall risk, dementia  Therapy recommendations:  CIR   Disposition:  Pending CIR.  Hydrocephalus -  series CT showed developing hydrocephalus 02/05/14 - neuro stable at this time - NSG on board  - close monitoring - On 3% saline - EVD placement if necessary  Hypertension  Home meds - amlodipine Permissive hypertension (OK if <220/120) for 24-48 hours post stroke and then gradually normalized within 5-7 days.  Stable  resume amlodipine  Hyperlipidemia  Home  meds:  lipitor 80 resumed in hospital  LDL 71, goal < 70  Continue statin at discharge  Malnutrition  No good intake for several days  Nausea not able to eat  Aspiration precaution  Nutrition on board with oral supplements.  Diet regular  Other Stroke Risk Factors  Advanced age  Hx stroke/TIA - hx small L cerebellar infarct 2011; progression L frontal micro hemorrhages since 2009 (? Amyloid)  Family hx stroke (mother)  Other Active Problems  Baseline dementia - ? amyloid  Collapsed vertebrae, requires help with ADLs, recently staying with daughter who helps provide care  Hospital day # 6  This patient is critically ill due to cerebellar stroke and hydrocephalus and at significant risk of neurological worsening, death form cerebral edema, obstructive hydrocephalus and brain herniation, and hemorrhagic conversion. This patient's care requires constant monitoring of vital signs, hemodynamics, respiratory and cardiac monitoring, review of multiple databases, neurological assessment, discussion with family, other specialists and medical decision making of high complexity. I spent 35 minutes of neurocritical care time in the care of this patient.  Rosalin Hawking, MD PhD Stroke Neurology 02/06/2014 4:57 PM    To contact Stroke Continuity provider, please refer to [email or web address] After hours, contact General Neurology

## 2014-02-06 NOTE — Progress Notes (Signed)
[place] TEAM 1 - Stepdown/ICU TEAM Progress Note  [name] HGD:[id number] DOB: Aug 01, 1933 DOA: 01/31/2014 PCP: Abigail Miyamoto, MD  Admit HPI / Brief Narrative: [name] is an 81 y.o. WF PMHx hypertension, hyperlipidemia, dementia, nontraumatic cerebral hemorrhage and ureterocele. Presented with new onset vertigo with nausea and vomiting as well as unstable gait. Patient was last known well at 10 PM last night 01/30/2014 (LKW). She noticed symptoms when she attempted to sit on the side of her bed at 4 AM. She's had no change in speech. No focal weakness has been noted. She said no visual changes. She's been taking aspirin daily. CT scan of her head showed nonhemorrhagic infarction involving a large portion of the left cerebral hemisphere inferiorly as well as a small portion of the right cerebellar hemisphere inferomedially. There was slight mass effect on fourth ventricle. NIH stroke score was 0. Patient was not administered TPA secondary to beyond time window for treatment consideration. She was admitted for further evaluation and treatment.    HPI/Subjective: 1/31 sleepy but arousable A/O 4, states nausea improved. Negative CP, negative SOB, negative headache.   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 Consuella Lose (neurosurgery), who will evaluate patient for possible shunt vs watchful waiting for patients increasing pressures by CT scan 1/31 per neurosurgery head CT in the a.m.  Increased cerebral pressure -See acute CVA -Continue following Dr. Consuella Lose (neurosurgery), recommendations; scheduled for head CT in the a.m.,  Hypertension -Continue hydralazine 10 mg  TID PRN SBP > 190  Hyperlipidemia -Lipid  panel within AHA guidelines - Continue Lipitor 80 mg daily   Other Stroke Risk Factors -Advanced age Hx stroke/TIA - hx small L cerebellar infarct 2011; - progression L frontal micro hemorrhages since 2009 (? Amyloid) -Family hx stroke (mother)  Refractory nausea -Patient has had multiple doses of Zofran without decreased in symptoms of nausea. -Continue Thorazine 12.5 mg TID 30 min prior to meals; may increase to 25 mg TID if does not drop patient's BP.  -Phenergan found to make patient extremely sleepy and unable to participate during the day with physical therapy DC  Dementia -At baseline  Hypokalemia -Potassium 10 mEq 3  Moderate Malnutrition -Patient and family agrees that NG tube will be inserted in the a.m. if patient unable to consume at least 50% of her meals -Dietitian recommends Magic cup TID with meals; substituted ensure pudding cups TID between meals   Code Status: FULL Family Communication: None    Disposition Plan: CIR vs SNF     Consultants: Dr.Pramod Sethi (neurology stroke team) Dr. Consuella Lose (neurosurgery)    Procedure/Significant Events: 1/25 CT head without contrast;- wedge-shaped low-attenuation involving the inferior left cerebellar hemisphere.- smaller focus of low attenuation in the medial inferior right cerebellar hemisphere.  -likely represent subacute nonhemorrhagic infarctions.  -mild to moderate chronic microvascular ischemic change in the deep white matter. 1/26 MRA/MRI brain;MRA HEAD FINDINGS - Patent anterior communicating artery.  -No large vessel occlusion, high-grade stenosis, aneurysm. -Posterior circulation: Mid grade stenosis of RIGHT P 2 segment, with compensatory diminutive RIGHT P1 segment, robust  -No large vessel occlusion, high-grade stenosis, aneurysm.  MRI HEAD:- Acute LEFT posterior inferior cerebellar artery territory infarct with petechial hemorrhage. -Scattered foci of susceptibility artifact in a pattern  suggesting sequelae of chronic hypertension. -Thready irregular LEFT posterior-inferior cerebellar artery, this could reflect atherosclerosis or mass effect from acute infarct. 1/26 carotid duplex; Bilateral carotid artery duplex completed: 1-39% ICA stenosis. Vertebral artery flow is antegrade.  1/26 echocardiogram;- LVEF= 55% to 60%. - (grade 1 diastolic dysfunction). 1/30 CT head without contrast; Evolving LEFT posterior inferior cerebellar artery territory infarct with worsening edema, progressive fourth ventricle and cerebral aqueduct effacement resulting in early obstructive hydrocephalus. -Worsening sphenoid sinusitis.    Culture NA  Antibiotics: NA  DVT prophylaxis: SCD   Devices NA   LINES / TUBES:      Continuous Infusions: . sodium chloride (hypertonic) 75 mL/hr (02/06/14 1022)    Objective: VITAL SIGNS: Temp: 98.3 F (36.8 C) (01/31 0400) Temp Source: Axillary (01/31 0400) BP: 145/55 mmHg (01/31 1500) Pulse Rate: 91 (01/31 1500) SPO2; FIO2:   Intake/Output Summary (Last 24 hours) at 02/06/14 1548 Last data filed at 02/06/14 1500  Gross per 24 hour  Intake 1621.75 ml  Output   2450 ml  Net -828.25 ml     Exam: General: Sleepy but arousable A/O 4, No acute respiratory distress Lungs: Clear to auscultation bilaterally without wheezes or crackles Cardiovascular: Regular rate and rhythm without murmur gallop or rub normal S1 and S2 Abdomen: Nontender, nondistended, soft, bowel sounds positive, no rebound, no ascites, no appreciable mass Extremities: No significant cyanosis, clubbing, or edema bilateral lower extremities Neurologic; patient sleepy did not perform full neuro checks. Arousable moving all extremities to command       Data Reviewed: Basic Metabolic Panel:  Recent Labs Lab 01/31/14 1604 02/02/14 0339 02/03/14 0440 02/05/14 0255 02/05/14 1942 02/06/14 0250 02/06/14 0440 02/06/14 1100  NA 135 130* 132* 134* 135 135 137 139   K 4.2 3.9 3.7 3.2*  --   --  3.0*  --   CL 103 99 98 103  --   --  103  --   CO2 25 22 26 26   --   --  24  --   GLUCOSE 122* 109* 93 87  --   --  97  --   BUN 8 8 11 11   --   --  6  --   CREATININE 0.92 0.93 0.84 0.82  --   --  0.82  --   CALCIUM 9.0 8.5 8.6 8.4  --   --  8.3*  --   MG  --  1.7  --  1.8  --   --  1.7  --    Liver Function Tests:  Recent Labs Lab 01/31/14 1604 02/02/14 0339 02/03/14 0440 02/05/14 0255 02/06/14 0440  AST 28 26 31 28  54*  ALT 25 20 20 17  39*  ALKPHOS 143* 127* 120* 101 111  BILITOT 0.6 1.0 1.0 0.7 1.0  PROT 6.6 6.0 6.1 5.2* 5.8*  ALBUMIN 3.6 3.3* 3.2* 2.8* 3.0*   No results for input(s): LIPASE, AMYLASE in the last 168 hours. No results for input(s): AMMONIA in the last 168 hours. CBC:  Recent Labs Lab 01/31/14 1604 02/02/14 0339 02/05/14 0255 02/06/14 0440  WBC 5.9 10.0 5.5 6.0  NEUTROABS 5.0 8.6*  --   --   HGB 12.6 12.2 11.9* 12.6  HCT 36.2 34.6* 32.8* 35.2*  MCV 93.3 90.6 89.4 90.0  PLT 281 234 213 211   Cardiac Enzymes: No results for input(s): CKTOTAL, CKMB, CKMBINDEX, TROPONINI in the last 168 hours. BNP (last 3 results) No results for input(s): PROBNP in the last 8760 hours.  CBG: No results for input(s): GLUCAP in the last 168 hours.  Recent Results (from the past 240 hour(s))  MRSA PCR Screening     Status: None   Collection Time: 01/31/14  8:04 PM  Result Value Ref Range Status   MRSA by PCR NEGATIVE NEGATIVE Final    Comment:        The GeneXpert MRSA Assay (FDA approved for NASAL specimens only), is one component of a comprehensive MRSA colonization surveillance program. It is not intended to diagnose MRSA infection nor to guide or monitor treatment for MRSA infections.      Studies:  Recent x-ray studies have been reviewed in detail by the Attending Physician  Scheduled Meds:  Scheduled Meds: . atorvastatin  80 mg Oral q morning - 10a  . chlorproMAZINE (THORAZINE) IV  12.5 mg Intravenous TID AC    . cholecalciferol  1,000 Units Oral BID  . clopidogrel  75 mg Oral Daily  . enoxaparin (LOVENOX) injection  40 mg Subcutaneous Q24H  . multivitamin with minerals  1 tablet Oral Daily  . pantoprazole  40 mg Oral Daily  . senna-docusate  1 tablet Oral BID  . vitamin B-12  1,000 mcg Oral Daily    Time spent on care of this patient: 40 mins   Allie Bossier [facility]  [facility] Office  [phone] Pager - [phone]  On-Call/Text Page:      [email or web address]      password [email or web address]  If 7PM-7AM, please contact night-coverage [email or web address] Password [email or web address] 02/06/2014, 3:48 PM   LOS: 6 days   Care during the described time interval was provided by me . I have reviewed this patient's available data, including medical history, events of note, physical examination, radiology studies and test results as part of my evaluation  Dia Crawford, MD [phone] Pager

## 2014-02-07 ENCOUNTER — Encounter (HOSPITAL_COMMUNITY): Payer: Self-pay

## 2014-02-07 ENCOUNTER — Inpatient Hospital Stay (HOSPITAL_COMMUNITY): Payer: Medicare Other

## 2014-02-07 LAB — COMPREHENSIVE METABOLIC PANEL
ALT: 59 U/L — AB (ref 0–35)
AST: 67 U/L — AB (ref 0–37)
Albumin: 2.7 g/dL — ABNORMAL LOW (ref 3.5–5.2)
Alkaline Phosphatase: 96 U/L (ref 39–117)
Anion gap: 5 (ref 5–15)
BUN: 6 mg/dL (ref 6–23)
CALCIUM: 8.4 mg/dL (ref 8.4–10.5)
CO2: 27 mmol/L (ref 19–32)
CREATININE: 0.72 mg/dL (ref 0.50–1.10)
Chloride: 112 mmol/L (ref 96–112)
GFR calc Af Amer: >90 mL/min (ref >90)
GFR calc non Af Amer: 80 mL/min — ABNORMAL LOW (ref >90)
Glucose, Bld: 101 mg/dL — ABNORMAL HIGH (ref 70–99)
POTASSIUM: 3 mmol/L — AB (ref 3.5–5.1)
SODIUM: 144 mmol/L (ref 135–145)
TOTAL PROTEIN: 5.2 g/dL — AB (ref 6.0–8.3)
Total Bilirubin: 0.8 mg/dL (ref 0.3–1.2)

## 2014-02-07 LAB — CBC
HCT: 31.2 % — ABNORMAL LOW (ref 36.0–46.0)
HEMOGLOBIN: 11.1 g/dL — AB (ref 12.0–15.0)
MCH: 32.4 pg (ref 26.0–34.0)
MCHC: 35.6 g/dL (ref 30.0–36.0)
MCV: 91 fL (ref 78.0–100.0)
Platelets: 208 10*3/uL (ref 150–400)
RBC: 3.43 MIL/uL — AB (ref 3.87–5.11)
RDW: 12.3 % (ref 11.5–15.5)
WBC: 5.6 10*3/uL (ref 4.0–10.5)

## 2014-02-07 LAB — SODIUM
SODIUM: 145 mmol/L (ref 135–145)
SODIUM: 146 mmol/L — AB (ref 135–145)
Sodium: 145 mmol/L (ref 135–145)

## 2014-02-07 LAB — MAGNESIUM: MAGNESIUM: 1.6 mg/dL (ref 1.5–2.5)

## 2014-02-07 MED ORDER — POTASSIUM CHLORIDE 10 MEQ/50ML IV SOLN
10.0000 meq | INTRAVENOUS | Status: AC
  Administered 2014-02-07 (×4): 10 meq via INTRAVENOUS
  Filled 2014-02-07 (×4): qty 50

## 2014-02-07 MED ORDER — MAGNESIUM SULFATE 2 GM/50ML IV SOLN
2.0000 g | Freq: Once | INTRAVENOUS | Status: AC
  Administered 2014-02-07: 2 g via INTRAVENOUS
  Filled 2014-02-07: qty 50

## 2014-02-07 NOTE — Progress Notes (Signed)
STROKE TEAM PROGRESS NOTE   HISTORY [name] is an 81 y.o. female history of hypertension, hyperlipidemia, dementia, nontraumatic cerebral hemorrhage and ureterocele, presenting with new onset vertigo with nausea and vomiting as well as unstable gait. Patient was last known well at 10 PM last night 01/30/2014 ([facility]). She noticed symptoms when she attempted to sit on the side of her bed at 4 AM. She's had no change in speech. No focal weakness has been noted. She said no visual changes. She's been taking aspirin daily. CT scan of her head showed nonhemorrhagic infarction involving a large portion of the left cerebral hemisphere inferiorly as well as a small portion of the right cerebellar hemisphere inferomedially. There was slight mass effect on fourth ventricle. NIH stroke score was 0. Patient was not administered TPA secondary to beyond time window for treatment consideration. She was admitted for further evaluation and treatment.   SUBJECTIVE (INTERVAL HISTORY) Her daughter and son are at the bedside.  Her neurologic conditions is stable. Awake alert, answered questions appropriately. she denies headache, but still nauseous. On 3% saline, sodium 145 this morning. Repeat CAT scan showed slightly decreased hydrocephalus.   OBJECTIVE Temp:  [98.3 F (36.8 C)-99 F (37.2 C)] 98.3 F (36.8 C) (02/01 1600) Pulse Rate:  [62-90] 77 (02/01 1600) Cardiac Rhythm:  [-] Normal sinus rhythm (02/01 0800) Resp:  [11-19] 17 (02/01 1600) BP: (136-187)/(57-73) 139/59 mmHg (02/01 1600) SpO2:  [94 %-99 %] 98 % (02/01 1600) Weight:  [131 lb 9.8 oz (59.7 kg)] 131 lb 9.8 oz (59.7 kg) (02/01 0400)  No results for input(s): GLUCAP in the last 168 hours.  Recent Labs Lab 02/02/14 0339 02/03/14 0440 02/05/14 0255  02/06/14 0440 02/06/14 1100 02/06/14 1645 02/06/14 2300 02/07/14 0510 02/07/14 1100  NA 130* 132* 134*  < > 137 139 143 143 144 145  K 3.9 3.7 3.2*  --  3.0*  --   --   --  3.0*  --   CL 99  98 103  --  103  --   --   --  112  --   CO2 22 26 26   --  24  --   --   --  27  --   GLUCOSE 109* 93 87  --  97  --   --   --  101*  --   BUN 8 11 11   --  6  --   --   --  6  --   CREATININE 0.93 0.84 0.82  --  0.82  --   --   --  0.72  --   CALCIUM 8.5 8.6 8.4  --  8.3*  --   --   --  8.4  --   MG 1.7  --  1.8  --  1.7  --   --   --  1.6  --   < > = values in this interval not displayed.  Recent Labs Lab 02/02/14 0339 02/03/14 0440 02/05/14 0255 02/06/14 0440 02/07/14 0510  AST 26 31 28  54* 67*  ALT 20 20 17  39* 59*  ALKPHOS 127* 120* 101 111 96  BILITOT 1.0 1.0 0.7 1.0 0.8  PROT 6.0 6.1 5.2* 5.8* 5.2*  ALBUMIN 3.3* 3.2* 2.8* 3.0* 2.7*    Recent Labs Lab 02/02/14 0339 02/05/14 0255 02/06/14 0440 02/07/14 0510  WBC 10.0 5.5 6.0 5.6  NEUTROABS 8.6*  --   --   --   HGB 12.2 11.9* 12.6 11.1*  HCT 34.6* 32.8* 35.2* 31.2*  MCV 90.6 89.4 90.0 91.0  PLT 234 213 211 208   No results for input(s): CKTOTAL, CKMB, CKMBINDEX, TROPONINI in the last 168 hours. No results for input(s): LABPROT, INR in the last 72 hours. No results for input(s): COLORURINE, LABSPEC, [place], GLUCOSEU, HGBUR, BILIRUBINUR, KETONESUR, PROTEINUR, UROBILINOGEN, NITRITE, LEUKOCYTESUR in the last 72 hours.  Invalid input(s): APPERANCEUR     Component Value Date/Time   CHOL 126 02/01/2014 0310   TRIG 46 02/01/2014 0310   HDL 46 02/01/2014 0310   CHOLHDL 2.7 02/01/2014 0310   VLDL 9 02/01/2014 0310   LDLCALC 71 02/01/2014 0310   Lab Results  Component Value Date   HGBA1C 5.6 02/01/2014   No results found for: LABOPIA, COCAINSCRNUR, LABBENZ, AMPHETMU, THCU, LABBARB  No results for input(s): ETH in the last 168 hours.  I have personally reviewed the radiological images below and agree with the radiology interpretations.  Ct Angio Head and neck W/cm &/or Wo Cm  02/03/2014   IMPRESSION: Acute infarct in the left inferior cerebellum in the PICA territory shows progressive edema and swelling with mass  effect on the fourth ventricle. No obstructive hydrocephalus or hemorrhage identified. Left PICA is now patent. Both vertebral arteries are widely patent.  Bilateral carotid atherosclerotic disease. Ulcerated plaque involving the right common carotid artery. No significant internal carotid artery stenosis. Moderately severe stenosis proximal right external carotid artery.  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   Electronically Signed   By: Franchot Gallo M.D.   On: 02/03/2014 07:34   Dg Chest 2 View  01/31/2014    IMPRESSION: No acute cardiopulmonary disease. Lower lung volumes than on prior with basilar atelectasis.     Ct Head Wo Contrast  02/07/14 - 1. Confluent left PICA territory with petechial hemorrhage, edema, and posterior fossa mass effect. Stable partially effaced basilar cisterns. 2. Interval mild regression of the acute ventriculomegaly, suggesting partial resolution of the fourth ventricle mass effect. 3. No new intracranial abnormality identified.  02/05/2014   IMPRESSION: Evolving LEFT posterior inferior cerebellar artery territory infarct with worsening edema, progressive fourth ventricle and cerebral aqueduct effacement resulting in early obstructive hydrocephalus.  Worsening sphenoid sinusitis.      02/02/2014    IMPRESSION: Evolving LEFT posterior inferior cerebellar artery territory infarct with worsening edema, partial effacement of the fourth ventricle without obstructive hydrocephalus. No hemorrhagic conversion.      01/31/2014   IMPRESSION: Subacute nonhemorrhagic infarction involving a large portion of the left cerebellar hemisphere inferiorly as well as a small portion of the right cerebellar hemisphere inferomedially. Minimal mass effect on the fourth ventricle.  Moderate atrophy and microvascular disease in the cerebral hemispheres.  Sphenoid sinus disease bilaterally.     Mri and Mra Brain Wo Contrast  02/01/2014   IMPRESSION: MRI HEAD: Acute LEFT  posterior inferior cerebellar artery territory infarct with petechial hemorrhage.  Scattered foci of susceptibility artifact in a pattern suggesting sequelae of chronic hypertension.  Acute sphenoid sinusitis.  MRA HEAD: Thready irregular LEFT posterior-inferior cerebellar artery, this could reflect atherosclerosis or mass effect from acute infarct.  Mid grade stenosis of RIGHT P2 segment with luminal irregularity of the bilateral posterior cerebral arteries consistent with atherosclerosis.     Carotid Doppler  There is 1-39% bilateral ICA stenosis. Vertebral artery flow is antegrade.    2D echo - Left ventricle: The cavity size was normal. Wall thickness was normal. Systolic function was normal. The estimated ejection fraction was in the  range of 55% to 60%. Wall motion was normal; there were no regional wall motion abnormalities. Doppler parameters are consistent with abnormal left ventricular relaxation (grade 1 diastolic dysfunction).  Impressions: - Normal LV function; grade 1 diastolic dysfunction; no significant valvular regurgitation noted.  Component     Latest Ref Rng 02/01/2014  Cholesterol     0 - 200 mg/dL 126  Triglycerides     <150 mg/dL 46  HDL     >39 mg/dL 46  Total CHOL/HDL Ratio      2.7  VLDL     0 - 40 mg/dL 9  LDL (calc)     0 - 99 mg/dL 71  Hgb A1c MFr Bld     <5.7 % 5.6  Mean Plasma Glucose     <117 mg/dL 114    PHYSICAL EXAM  Temp:  [98.3 F (36.8 C)-99 F (37.2 C)] 98.3 F (36.8 C) (02/01 1600) Pulse Rate:  [62-90] 77 (02/01 1600) Resp:  [11-19] 17 (02/01 1600) BP: (136-187)/(57-73) 139/59 mmHg (02/01 1600) SpO2:  [94 %-99 %] 98 % (02/01 1600) Weight:  [131 lb 9.8 oz (59.7 kg)] 131 lb 9.8 oz (59.7 kg) (02/01 0400)  General - Well nourished, well developed, not in acute distress.  Ophthalmologic - not cooperate due to photophobia.  Cardiovascular - Regular rate and rhythm with no murmur.  Mental Status -  Level of arousal and  orientation to time, place, and person were intact. Language including expression, naming, repetition, comprehension was assessed and found intact. Following commands.  Cranial Nerves II - XII - II - Visual field intact OU. III, IV, VI - Extraocular movements intact, but with bilateral nystagmus. V - Facial sensation intact bilaterally. VII - Facial movement intact bilaterally. VIII - Hearing & vestibular intact bilaterally, but with bilateral nystagmus. X - Palate elevates symmetrically. XI - Chin turning & shoulder shrug intact bilaterally. XII - Tongue protrusion intact.  Motor Strength - The patient's strength was normal in all extremities and pronator drift was absent.  Bulk was normal and fasciculations were absent.   Motor Tone - Muscle tone was assessed at the neck and appendages and was normal.  Reflexes - The patient's reflexes were 1+ in all extremities and she had no pathological reflexes.  Sensory - Light touch, temperature/pinprick, vibration and proprioception, and Romberg testing were assessed and were normal.    Coordination - The patient had mild dysmetria on the left with finger to nose testing.  Tremor was absent.  Gait and Station - not tested.   ASSESSMENT/PLAN Ms. [name] is a 81 y.o. female with history of hypertension, hyperlipidemia, dementia, nontraumatic cerebral hemorrhage and ureterocele presenting with new onset nausea and vomiting as well as dizziness and unstable gait. She did not receive IV t-PA due to delay in arrival.   Stroke:  left PICA infarct with petechial hemorrhage c/w chronic hypertension, felt to be embolic (? Afib) vs. Large vessel athero, at risk for neuro worsening given subtentorial location  Resultant  No focal deficit  MRI  L PICA infarct  MRA  Thready L PICA, R P2 high grade stenosis  CTA head and neck showed left VA athero and bilateral ICA athero  Repeat CT showed slightly decreased hydrocephalus -  Continue close  observation, 3% saline, and consider taper off 3% saline tomorrow  Carotid Doppler  No significant stenosis   2D Echo  unremarkable   HgbA1c 5.6  Lovenox 40 mg sq daily for VTE prophylaxis  Diet regular  thin liquids  aspirin 81 mg orally every day prior to admission, now on clopidogrel 75 mg orally every day  Ongoing aggressive stroke risk factor management  No further embolic workup as pt not an anticoagulation candidate due to fall risk, dementia  Therapy recommendations:  CIR   Disposition:  Pending CIR.  Hydrocephalus - series CT showed developing hydrocephalus 02/05/14 - Repeat CAT scan today shows slightly decreased hydrocephalus - neuro stable at this time - NSG on board  - close monitoring - On 3% saline - Consider taper off 3% saline in a.m.  Hypertension  Home meds - amlodipine Blood pressure goal normotensive  On the high side  resume amlodipine today  Hyperlipidemia  Home meds:  lipitor 80 resumed in hospital  LDL 71, goal < 70  Continue statin at discharge  Malnutrition  No good intake for several days  Nausea not able to eat  Aspiration precaution  Ensure for oral supplements.  Diet regular  Other Stroke Risk Factors  Advanced age  Hx stroke/TIA - hx small L cerebellar infarct 2011; progression L frontal micro hemorrhages since 2009 (? Amyloid)  Family hx stroke (mother)  Other Active Problems  Baseline dementia - ? amyloid  Collapsed vertebrae, requires help with ADLs, recently staying with daughter who helps provide care  Hospital day # 7  This patient is critically ill due to cerebellar stroke and hydrocephalus and at significant risk of neurological worsening, death form cerebral edema, obstructive hydrocephalus and brain herniation, and hemorrhagic conversion. This patient's care requires constant monitoring of vital signs, hemodynamics, respiratory and cardiac monitoring, review of multiple databases, neurological  assessment, discussion with family, other specialists and medical decision making of high complexity. I spent 35 minutes of neurocritical care time in the care of this patient.  Rosalin Hawking, MD PhD Stroke Neurology 02/07/2014 4:48 PM    To contact Stroke Continuity provider, please refer to [email or web address] After hours, contact General Neurology

## 2014-02-07 NOTE — Progress Notes (Signed)
[facility] TEAM 1 - Stepdown/ICU TEAM Progress Note  [name] NWG:[id number] DOB: 1933/07/26 DOA: 01/31/2014 PCP: Abigail Miyamoto, MD  Admit HPI / Brief Narrative: 81 yo F w/ Hx hypertension, hyperlipidemia, dementia, nontraumatic cerebral hemorrhage, and ureterocele who presented with new onset vertigo with nausea and vomiting as well as unstable gait. Patient was last known well at 10 PM 01/30/2014. She noticed symptoms when she attempted to sit on the side of her bed at 4 AM 01/31/14. She had no change in speech. No focal weakness noted. No visual changes. She'd been taking aspirin daily. CT scan of head showed nonhemorrhagic infarction involving a large portion of the left cerebral hemisphere inferiorly as well as a small portion of the right cerebellar hemisphere inferomedially. There was slight mass effect on the fourth ventricle. NIH stroke score was 0. Patient was not administered TPA secondary to being beyond time window for treatment consideration.   HPI/Subjective: Pt is actually much more awake and conversant than when I last saw her 02/02/14.  She c/o ongoing nausea, but no vomiting thus far today.  She denies HA, cp, or sob.    Assessment/Plan:  Acute LEFT posterior inferior cerebellar artery territory CVA -felt to be embolic (? Afib) - no further embolic workup as pt not an anticoagulation candidate due to fall risk, dementia -Stroke Team on board - they suggested changing ASA to Plavix 75 mg daily -serial CT noted steady progression/worsening of edema w/ scan 1/30 noting new effacement of the 4th ventricle and early obstructive hydrocephalus   -1/30 Dr. Sherral Hammers spoke with Dr. Consuella Lose (Neurosurgery) who will evaluate patient for possible shunt vs watchful waiting for patients increasing pressures by CT scan -2/1 f/u CT head pending - clinically pt is currently stable   Increased cerebral pressure / effacement of 4th ventricle  -Neurosurgery following - awaiting f/u CT  head today -hypertonic saline per Neurology   Refractory nausea -due to above - counseled family that med tx may offer little relief but will use prn  -Continue Thorazine 12.5 mg TID 30 min prior to meals; may increase to 25 mg TID if does not drop patient's BP  -Phenergan found to make patient extremely sleepy and unable to participate during the day with physical therapy - no benefit w/ trial of zofran  Hypertension -Continue hydralazine 10 mg TID PRN SBP > 190 - avoid overagressive correction/normalization in setting of CVA w/ hydrocephalus   Hyperlipidemia -Lipid panel within AHA guidelines -Continue Lipitor 80 mg daily   Dementia -At baseline - pt alert and conversant   Hypokalemia / Hypomagnesemia -replete to goal of 4.0 - replace Mg to goal of 2.0   Moderate Malnutrition -Patient and family agree to consider NG tube if patient unable to consume at least 50% of her meals -Dietitian recommends Magic cup TID with meals; substituted ensure pudding cups TID between meals - follow intake for now, and until it is clear if surgical intervention will be required   Code Status: FULL Family Communication: spoke w/ daughter and sister at bedside at length  Disposition Plan: CIR vs SNF eventually   Consultants: Dr.Pramod Leonie Man (neurology stroke team) Dr. Consuella Lose (neurosurgery)  Antibiotics: NA  DVT prophylaxis: SCD  Objective: Blood pressure 177/61, pulse 74, temperature 98.5 F (36.9 C), temperature source Oral, resp. rate 15, height 5\' 4"  (1.626 m), weight 59.7 kg (131 lb 9.8 oz), SpO2 99 %.  Intake/Output Summary (Last 24 hours) at 02/07/14 0926 Last data filed at 02/07/14 0700  Gross per 24 hour  Intake 2021.75 ml  Output   2250 ml  Net -228.25 ml     Exam: General: alert and conversant - no acute respiratory distress Lungs: Clear to auscultation bilaterally without wheezes or crackles Cardiovascular: Regular rate and rhythm without murmur gallop or rub   Abdomen: Nontender, nondistended, soft, bowel sounds positive, no rebound, no ascites, no appreciable mass Extremities: No significant cyanosis, clubbing, or edema bilateral lower extremities   Data Reviewed: Basic Metabolic Panel:  Recent Labs Lab 02/02/14 0339 02/03/14 0440 02/05/14 0255  02/06/14 0440 02/06/14 1100 02/06/14 1645 02/06/14 2300 02/07/14 0510  NA 130* 132* 134*  < > 137 139 143 143 144  K 3.9 3.7 3.2*  --  3.0*  --   --   --  3.0*  CL 99 98 103  --  103  --   --   --  112  CO2 22 26 26   --  24  --   --   --  27  GLUCOSE 109* 93 87  --  97  --   --   --  101*  BUN 8 11 11   --  6  --   --   --  6  CREATININE 0.93 0.84 0.82  --  0.82  --   --   --  0.72  CALCIUM 8.5 8.6 8.4  --  8.3*  --   --   --  8.4  MG 1.7  --  1.8  --  1.7  --   --   --  1.6  < > = values in this interval not displayed.   Liver Function Tests:  Recent Labs Lab 02/02/14 0339 02/03/14 0440 02/05/14 0255 02/06/14 0440 02/07/14 0510  AST 26 31 28  54* 67*  ALT 20 20 17  39* 59*  ALKPHOS 127* 120* 101 111 96  BILITOT 1.0 1.0 0.7 1.0 0.8  PROT 6.0 6.1 5.2* 5.8* 5.2*  ALBUMIN 3.3* 3.2* 2.8* 3.0* 2.7*   CBC:  Recent Labs Lab 01/31/14 1604 02/02/14 0339 02/05/14 0255 02/06/14 0440 02/07/14 0510  WBC 5.9 10.0 5.5 6.0 5.6  NEUTROABS 5.0 8.6*  --   --   --   HGB 12.6 12.2 11.9* 12.6 11.1*  HCT 36.2 34.6* 32.8* 35.2* 31.2*  MCV 93.3 90.6 89.4 90.0 91.0  PLT 281 234 213 211 208    Recent Results (from the past 240 hour(s))  MRSA PCR Screening     Status: None   Collection Time: 01/31/14  8:04 PM  Result Value Ref Range Status   MRSA by PCR NEGATIVE NEGATIVE Final    Comment:        The GeneXpert MRSA Assay (FDA approved for NASAL specimens only), is one component of a comprehensive MRSA colonization surveillance program. It is not intended to diagnose MRSA infection nor to guide or monitor treatment for MRSA infections.      Studies:  Recent x-ray studies have been  reviewed in detail by the Attending Physician  Scheduled Meds:  Scheduled Meds: . amLODipine  5 mg Oral Daily  . atorvastatin  80 mg Oral q morning - 10a  . chlorproMAZINE (THORAZINE) IV  12.5 mg Intravenous TID AC  . cholecalciferol  1,000 Units Oral BID  . clopidogrel  75 mg Oral Daily  . enoxaparin (LOVENOX) injection  40 mg Subcutaneous Q24H  . feeding supplement (ENSURE)  1 Container Oral TID BM  . multivitamin with minerals  1 tablet Oral Daily  .  pantoprazole  40 mg Oral Daily  . senna-docusate  1 tablet Oral BID  . vitamin B-12  1,000 mcg Oral Daily    Time spent on care of this patient: 35 mins  Cherene Altes, MD [facility] For Consults/Admissions - Flow Manager - [phone] Office  [phone]  Contact MD directly via text page:      [email or web address]      password [email or web address]   If 7PM-7AM, please contact night-coverage [email or web address] Password [email or web address] 02/07/2014, 9:26 AM   LOS: 7 days

## 2014-02-07 NOTE — Progress Notes (Signed)
UR completed.  Edris Schneck, RN BSN [facility] CCM Trauma/Neuro ICU Case Manager [phone]  

## 2014-02-07 NOTE — Progress Notes (Signed)
Pt seen and examined. No issues overnight. Pt denies HA, visual changes this am. Does have some nausea, no vomiting.  EXAM: Temp:  [98.3 F (36.8 C)-99 F (37.2 C)] 98.3 F (36.8 C) (02/01 1600) Pulse Rate:  [62-90] 77 (02/01 1600) Resp:  [11-19] 17 (02/01 1600) BP: (136-187)/(57-73) 139/59 mmHg (02/01 1600) SpO2:  [94 %-99 %] 98 % (02/01 1600) Weight:  [59.7 kg (131 lb 9.8 oz)] 59.7 kg (131 lb 9.8 oz) (02/01 0400) Intake/Output      01/31 0701 - 02/01 0700 02/01 0701 - 02/02 0700   P.O. 180    I.V. (mL/kg) 1761.8 (29.5) 675 (11.3)   IV Piggyback 225 175   Total Intake(mL/kg) 2166.8 (36.3) 850 (14.2)   Urine (mL/kg/hr) 2250 (1.6) 1855 (3)   Stool     Total Output 2250 1855   Net -83.3 -1005         Awake, alert Speech fluent Moving all ext well.  LABS: Lab Results  Component Value Date   CREATININE 0.72 02/07/2014   BUN 6 02/07/2014   NA 145 02/07/2014   K 3.0* 02/07/2014   CL 112 02/07/2014   CO2 27 02/07/2014   Lab Results  Component Value Date   WBC 5.6 02/07/2014   HGB 11.1* 02/07/2014   HCT 31.2* 02/07/2014   MCV 91.0 02/07/2014   PLT 208 02/07/2014    IMAGING: CTH reviewed, with stable appearance of left cerebellar stroke and 4th ventricular effacement. Ventriculomegaly appears slightly improved.  IMPRESSION: - 81 y.o. female s/p LPICA stroke with stable exam and some CT improvement in ventriculomegaly.  PLAN: - cont observation - Will likely taper off 3% tomorrow

## 2014-02-07 NOTE — Progress Notes (Signed)
PT Cancellation Note  Patient Details Name: [name] MRN: [id number] DOB: 03-01-1933   Cancelled Treatment:    Reason Eval/Treat Not Completed: Medical issues which prohibited therapy (Pt is feeling poorly today and wants to wait until tomorrow.)Noted that pt has had some incr neuro symptoms and was moved to 3MW.  Will return tomorrow and continue as able.  Thanks.    Irwin Brakeman F 02/07/2014, 10:32 AM  [facility] [phone] (pager)

## 2014-02-07 NOTE — Progress Notes (Signed)
Rehab admissions - Noted patient now in the ICU.  I spoke with patient's daughter.  The family is interested in inpatient rehab once patient is medically stable.  I did receive insurance authorization, but likely patient not medically ready for today.  Call me for questions.  #[phone]

## 2014-02-08 LAB — GLUCOSE, CAPILLARY
GLUCOSE-CAPILLARY: 135 mg/dL — AB (ref 70–99)
Glucose-Capillary: 116 mg/dL — ABNORMAL HIGH (ref 70–99)

## 2014-02-08 LAB — CBC
HCT: 31.2 % — ABNORMAL LOW (ref 36.0–46.0)
HEMOGLOBIN: 11.3 g/dL — AB (ref 12.0–15.0)
MCH: 33.5 pg (ref 26.0–34.0)
MCHC: 36.2 g/dL — ABNORMAL HIGH (ref 30.0–36.0)
MCV: 92.6 fL (ref 78.0–100.0)
Platelets: 203 10*3/uL (ref 150–400)
RBC: 3.37 MIL/uL — ABNORMAL LOW (ref 3.87–5.11)
RDW: 12.2 % (ref 11.5–15.5)
WBC: 8.4 10*3/uL (ref 4.0–10.5)

## 2014-02-08 LAB — BASIC METABOLIC PANEL
ANION GAP: 7 (ref 5–15)
BUN: 6 mg/dL (ref 6–23)
CO2: 23 mmol/L (ref 19–32)
Calcium: 8.1 mg/dL — ABNORMAL LOW (ref 8.4–10.5)
Chloride: 117 mmol/L — ABNORMAL HIGH (ref 96–112)
Creatinine, Ser: 0.67 mg/dL (ref 0.50–1.10)
GFR calc non Af Amer: 81 mL/min — ABNORMAL LOW (ref >90)
GLUCOSE: 116 mg/dL — AB (ref 70–99)
POTASSIUM: 2.9 mmol/L — AB (ref 3.5–5.1)
Sodium: 147 mmol/L — ABNORMAL HIGH (ref 135–145)

## 2014-02-08 LAB — SODIUM
Sodium: 145 mmol/L (ref 135–145)
Sodium: 147 mmol/L — ABNORMAL HIGH (ref 135–145)

## 2014-02-08 MED ORDER — POTASSIUM CHLORIDE 20 MEQ/15ML (10%) PO SOLN
40.0000 meq | ORAL | Status: AC
  Administered 2014-02-08 (×2): 40 meq via ORAL
  Filled 2014-02-08 (×3): qty 30

## 2014-02-08 MED ORDER — AMLODIPINE BESYLATE 5 MG PO TABS
5.0000 mg | ORAL_TABLET | Freq: Two times a day (BID) | ORAL | Status: DC
  Administered 2014-02-08: 5 mg via ORAL
  Filled 2014-02-08 (×3): qty 1

## 2014-02-08 MED ORDER — POTASSIUM CHLORIDE CRYS ER 20 MEQ PO TBCR
40.0000 meq | EXTENDED_RELEASE_TABLET | ORAL | Status: DC
  Filled 2014-02-08: qty 2

## 2014-02-08 MED ORDER — BOOST / RESOURCE BREEZE PO LIQD
1.0000 | Freq: Three times a day (TID) | ORAL | Status: DC
  Administered 2014-02-08 (×3): 1 via ORAL

## 2014-02-08 NOTE — Progress Notes (Signed)
Physical Therapy Treatment Patient Details Name: [name] MRN: [id number] DOB: 14-Jul-1933 Today's Date: 02/08/2014    History of Present Illness 81 y.o. WF PMHx hypertension, hyperlipidemia, dementia, nontraumatic cerebral hemorrhage and ureterocele. Presented with new onset vertigo with nausea and vomiting as well as unstable gait. 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    PT Comments    Pt needs encouragement for mobility and education on importance of sitting up at least an hour 2x per day.  Pt needs frequent cueing for use of "A" target with gaze stabilization during mobility and when sitting/standing statically.  Continue to feel pt would benefit from CIR at D/C to maximize independence.  Will continue to follow.    Follow Up Recommendations  CIR     Equipment Recommendations   (TBD)    Recommendations for Other Services       Precautions / Restrictions Precautions Precautions: Fall Restrictions Weight Bearing Restrictions: No    Mobility  Bed Mobility Overal bed mobility: Needs Assistance;+2 for physical assistance Bed Mobility: Rolling;Sidelying to Sit Rolling: Min assist Sidelying to sit: Mod assist;+2 for physical assistance;HOB elevated       General bed mobility comments: cues for log roll technique and sequencing.  pt needs frequent cueing for gaze stabilization.  pt only maintains gaze for seconds then needs cueing to re-focus gaze.  pt indicates nausea and dizziness with side to sit only.  Transfers Overall transfer level: Needs assistance Equipment used: 2 person hand held assist Transfers: Sit to/from [facility] Sit to Stand: Mod assist;+2 physical assistance Stand pivot transfers: Mod assist;+2 physical assistance       General transfer comment: cues for UE use, movement through pivot, max cueing for gaze stabilization.  pt notes less dizziness with coming to stand than wen came to  sitting position.  Facilitation for upright posture and maintaining center of balance.    Ambulation/Gait                 Stairs            Wheelchair Mobility    Modified Rankin (Stroke Patients Only) Modified Rankin (Stroke Patients Only) Pre-Morbid Rankin Score: No significant disability Modified Rankin: Severe disability     Balance Overall balance assessment: Needs assistance Sitting-balance support: Bilateral upper extremity supported;Feet supported Sitting balance-Leahy Scale: Poor Sitting balance - Comments: pt with R lateral lean and needs kinesthetic input of being allowed to tip to R side for pt to realize she's leaning.  Max cueing for use of "A" as target for gaze stabilization.  In sitting R Nystagmus noted.   Postural control: Right lateral lean Standing balance support: During functional activity Standing balance-Leahy Scale: Poor Standing balance comment: pt continues to need frequent cueing for use of "A" target with gaze stabilization.  Facilitation for upright posture as pt remaining very flexed.                      Cognition Arousal/Alertness: Lethargic Behavior During Therapy: Flat affect Overall Cognitive Status: History of cognitive impairments - at baseline                      Exercises      General Comments        Pertinent Vitals/Pain Pain Assessment: No/denies pain    Home Living  Prior Function            PT Goals (current goals can now be found in the care plan section) Acute Rehab PT Goals Patient Stated Goal: to feel better PT Goal Formulation: With patient/family Time For Goal Achievement: 02/15/14 Potential to Achieve Goals: Good Progress towards PT goals: Progressing toward goals    Frequency  Min 3X/week    PT Plan Current plan remains appropriate    Co-evaluation             End of Session Equipment Utilized During Treatment: Gait belt Activity  Tolerance: Treatment limited secondary to medical complications (Comment) (Limited by nausea) Patient left: in chair;with call bell/phone within reach;with family/visitor present     Time: 0354-6568 PT Time Calculation (min) (ACUTE ONLY): 27 min  Charges:  $Therapeutic Activity: 23-37 mins                    G CodesCatarina Hartshorn, [place] 02/08/2014, 10:05 AM

## 2014-02-08 NOTE — Progress Notes (Signed)
NUTRITION FOLLOW-UP  DOCUMENTATION CODES Per approved criteria  -Non-severe (moderate) malnutrition in the context of acute illness or injury   INTERVENTION: Magic cup TID with meals, each supplement provides 290 kcal and 9 grams of protein  Resource Breeze po TID, each supplement provides 250 kcal and 9 grams of protein  NUTRITION DIAGNOSIS: Malnutrition related to acute illness as evidenced by intake < 75% of her needs for > 7 days and mild depletion of muscle; ongoing.   Goal: Pt to meet >/= 90% of their estimated nutrition needs; not met.    Monitor:  PO intake, supplement acceptance  ASSESSMENT: Pt admitted with new onset vertigo and N/V found to have acute left posterior inferior cerebellar artery territory infarct.  Pt transferred to 52M for 3% IVF.  Pt discussed during ICU rounds and with RN.  Per RN pt only took 1-2 bites this am for Breakfast. Pt sitting up in chair and looks much better than when I first saw her. She states that she ordered the pork chop for lunch and wants to eat. Not sure how much she will actually eat since she has ate very little for about 2 weeks. Pt complains that all the supplements are sweet.  Reviewed supplement options with pt and she is agreeable to trying Resource. Encouraged her to eat at least 1/2 of her magic cup with meals and try to drink at least 1/2 of the Breeze between meals. Daughter at bedside.  Pt reports nausea but is getting her meds prior to meals and is no longer vomiting.   Potassium low.  Height: Ht Readings from Last 1 Encounters:  02/05/14 5' 4"  (1.626 m)    Weight: Wt Readings from Last 1 Encounters:  02/08/14 123 lb 14.4 oz (56.2 kg)    Usual Body Weight: 135 lb   BMI:  Body mass index is 21.26 kg/(m^2).  Estimated Nutritional Needs: Kcal: 1400-1600 Protein: 70-85 grams Fluid: > 1.5 L/day  Skin: intact  Diet Order: Diet regular   Intake/Output Summary (Last 24 hours) at 02/08/14 1114 Last data filed at  02/08/14 1000  Gross per 24 hour  Intake 2340.63 ml  Output   3420 ml  Net -1079.37 ml    Last BM: 2/2  Labs:   Recent Labs Lab 02/05/14 0255  02/06/14 0440  02/07/14 0510  02/07/14 1700 02/07/14 2300 02/08/14 0455  NA 134*  < > 137  < > 144  < > 146* 145 147*  K 3.2*  --  3.0*  --  3.0*  --   --   --  2.9*  CL 103  --  103  --  112  --   --   --  117*  CO2 26  --  24  --  27  --   --   --  23  BUN 11  --  6  --  6  --   --   --  6  CREATININE 0.82  --  0.82  --  0.72  --   --   --  0.67  CALCIUM 8.4  --  8.3*  --  8.4  --   --   --  8.1*  MG 1.8  --  1.7  --  1.6  --   --   --   --   GLUCOSE 87  --  97  --  101*  --   --   --  116*  < > = values in this  interval not displayed.  CBG (last 3)   Recent Labs  02/07/14 2007 02/07/14 2338  GLUCAP 135* 116*    Scheduled Meds: . amLODipine  5 mg Oral Daily  . atorvastatin  80 mg Oral q morning - 10a  . chlorproMAZINE (THORAZINE) IV  12.5 mg Intravenous TID AC  . cholecalciferol  1,000 Units Oral BID  . clopidogrel  75 mg Oral Daily  . enoxaparin (LOVENOX) injection  40 mg Subcutaneous Q24H  . feeding supplement (ENSURE)  1 Container Oral TID BM  . multivitamin with minerals  1 tablet Oral Daily  . pantoprazole  40 mg Oral Daily  . potassium chloride  40 mEq Oral Q4H  . senna-docusate  1 tablet Oral BID  . vitamin B-12  1,000 mcg Oral Daily    Continuous Infusions: . sodium chloride (hypertonic) 37.5 mL/hr at 02/08/14 [place], [place], [place] Pager [phone] After Hours Pager

## 2014-02-08 NOTE — Progress Notes (Signed)
STROKE TEAM PROGRESS NOTE   HISTORY Shir Bergman is an 81 y.o. female history of hypertension, hyperlipidemia, dementia, nontraumatic cerebral hemorrhage and ureterocele, presenting with new onset vertigo with nausea and vomiting as well as unstable gait. Patient was last known well at 10 PM last night 01/30/2014 (LKW). She noticed symptoms when she attempted to sit on the side of her bed at 4 AM. She's had no change in speech. No focal weakness has been noted. She said no visual changes. She's been taking aspirin daily. CT scan of her head showed nonhemorrhagic infarction involving a large portion of the left cerebral hemisphere inferiorly as well as a small portion of the right cerebellar hemisphere inferomedially. There was slight mass effect on fourth ventricle. NIH stroke score was 0. Patient was not administered TPA secondary to beyond time window for treatment consideration. She was admitted for further evaluation and treatment.   SUBJECTIVE (INTERVAL HISTORY) No family at the bedside.  Her neurologic conditions is stable. Awake alert, answered questions appropriately. she denies headache, still nauseous, sitting is better than yesterday. On 3% saline, sodium 147 this morning. Repeat CAT scan showed slightly decreased hydrocephalus. We will taper off 3% saline as per protocol.  OBJECTIVE Temp:  [98.1 F (36.7 C)-98.8 F (37.1 C)] 98.7 F (37.1 C) (02/02 0800) Pulse Rate:  [73-103] 87 (02/02 1100) Cardiac Rhythm:  [-] Normal sinus rhythm (02/02 0800) Resp:  [11-19] 18 (02/02 1100) BP: (128-190)/(57-87) 159/73 mmHg (02/02 1100) SpO2:  [97 %-99 %] 99 % (02/02 1100) Weight:  [123 lb 14.4 oz (56.2 kg)] 123 lb 14.4 oz (56.2 kg) (02/02 0400)   Recent Labs Lab 02/07/14 2007 02/07/14 2338  GLUCAP 135* 116*    Recent Labs Lab 02/02/14 0339 02/03/14 0440 02/05/14 0255  02/06/14 0440  02/07/14 0510 02/07/14 1100 02/07/14 1700 02/07/14 2300 02/08/14 0455  NA 130* 132* 134*  < > 137   < > 144 145 146* 145 147*  K 3.9 3.7 3.2*  --  3.0*  --  3.0*  --   --   --  2.9*  CL 99 98 103  --  103  --  112  --   --   --  117*  CO2 22 26 26   --  24  --  27  --   --   --  23  GLUCOSE 109* 93 87  --  97  --  101*  --   --   --  116*  BUN 8 11 11   --  6  --  6  --   --   --  6  CREATININE 0.93 0.84 0.82  --  0.82  --  0.72  --   --   --  0.67  CALCIUM 8.5 8.6 8.4  --  8.3*  --  8.4  --   --   --  8.1*  MG 1.7  --  1.8  --  1.7  --  1.6  --   --   --   --   < > = values in this interval not displayed.  Recent Labs Lab 02/02/14 0339 02/03/14 0440 02/05/14 0255 02/06/14 0440 02/07/14 0510  AST 26 31 28  54* 67*  ALT 20 20 17  39* 59*  ALKPHOS 127* 120* 101 111 96  BILITOT 1.0 1.0 0.7 1.0 0.8  PROT 6.0 6.1 5.2* 5.8* 5.2*  ALBUMIN 3.3* 3.2* 2.8* 3.0* 2.7*    Recent Labs Lab 02/02/14 0962 02/05/14 0255 02/06/14 0440  02/07/14 0510 02/08/14 0455  WBC 10.0 5.5 6.0 5.6 8.4  NEUTROABS 8.6*  --   --   --   --   HGB 12.2 11.9* 12.6 11.1* 11.3*  HCT 34.6* 32.8* 35.2* 31.2* 31.2*  MCV 90.6 89.4 90.0 91.0 92.6  PLT 234 213 211 208 203   No results for input(s): CKTOTAL, CKMB, CKMBINDEX, TROPONINI in the last 168 hours. No results for input(s): LABPROT, INR in the last 72 hours. No results for input(s): COLORURINE, LABSPEC, [place], GLUCOSEU, HGBUR, BILIRUBINUR, KETONESUR, PROTEINUR, UROBILINOGEN, NITRITE, LEUKOCYTESUR in the last 72 hours.  Invalid input(s): APPERANCEUR     Component Value Date/Time   CHOL 126 02/01/2014 0310   TRIG 46 02/01/2014 0310   HDL 46 02/01/2014 0310   CHOLHDL 2.7 02/01/2014 0310   VLDL 9 02/01/2014 0310   LDLCALC 71 02/01/2014 0310   Lab Results  Component Value Date   HGBA1C 5.6 02/01/2014   No results found for: LABOPIA, COCAINSCRNUR, LABBENZ, AMPHETMU, THCU, LABBARB  No results for input(s): ETH in the last 168 hours.  I have personally reviewed the radiological images below and agree with the radiology interpretations.  Ct Angio Head  and neck W/cm &/or Wo Cm  02/03/2014   IMPRESSION: Acute infarct in the left inferior cerebellum in the PICA territory shows progressive edema and swelling with mass effect on the fourth ventricle. No obstructive hydrocephalus or hemorrhage identified. Left PICA is now patent. Both vertebral arteries are widely patent.  Bilateral carotid atherosclerotic disease. Ulcerated plaque involving the right common carotid artery. No significant internal carotid artery stenosis. Moderately severe stenosis proximal right external carotid artery.  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   Electronically Signed   By: Franchot Gallo M.D.   On: 02/03/2014 07:34   Dg Chest 2 View  01/31/2014    IMPRESSION: No acute cardiopulmonary disease. Lower lung volumes than on prior with basilar atelectasis.     Ct Head Wo Contrast  02/07/14 - 1. Confluent left PICA territory with petechial hemorrhage, edema, and posterior fossa mass effect. Stable partially effaced basilar cisterns. 2. Interval mild regression of the acute ventriculomegaly, suggesting partial resolution of the fourth ventricle mass effect. 3. No new intracranial abnormality identified.  02/05/2014   IMPRESSION: Evolving LEFT posterior inferior cerebellar artery territory infarct with worsening edema, progressive fourth ventricle and cerebral aqueduct effacement resulting in early obstructive hydrocephalus.  Worsening sphenoid sinusitis.      02/02/2014    IMPRESSION: Evolving LEFT posterior inferior cerebellar artery territory infarct with worsening edema, partial effacement of the fourth ventricle without obstructive hydrocephalus. No hemorrhagic conversion.      01/31/2014   IMPRESSION: Subacute nonhemorrhagic infarction involving a large portion of the left cerebellar hemisphere inferiorly as well as a small portion of the right cerebellar hemisphere inferomedially. Minimal mass effect on the fourth ventricle.  Moderate atrophy and  microvascular disease in the cerebral hemispheres.  Sphenoid sinus disease bilaterally.     Mri and Mra Brain Wo Contrast  02/01/2014   IMPRESSION: MRI HEAD: Acute LEFT posterior inferior cerebellar artery territory infarct with petechial hemorrhage.  Scattered foci of susceptibility artifact in a pattern suggesting sequelae of chronic hypertension.  Acute sphenoid sinusitis.  MRA HEAD: Thready irregular LEFT posterior-inferior cerebellar artery, this could reflect atherosclerosis or mass effect from acute infarct.  Mid grade stenosis of RIGHT P2 segment with luminal irregularity of the bilateral posterior cerebral arteries consistent with atherosclerosis.     Carotid Doppler  There is  1-39% bilateral ICA stenosis. Vertebral artery flow is antegrade.    2D echo - Left ventricle: The cavity size was normal. Wall thickness was normal. Systolic function was normal. The estimated ejection fraction was in the range of 55% to 60%. Wall motion was normal; there were no regional wall motion abnormalities. Doppler parameters are consistent with abnormal left ventricular relaxation (grade 1 diastolic dysfunction).  Impressions: - Normal LV function; grade 1 diastolic dysfunction; no significant valvular regurgitation noted.  Component     Latest Ref Rng 02/01/2014  Cholesterol     0 - 200 mg/dL 126  Triglycerides     <150 mg/dL 46  HDL     >39 mg/dL 46  Total CHOL/HDL Ratio      2.7  VLDL     0 - 40 mg/dL 9  LDL (calc)     0 - 99 mg/dL 71  Hgb A1c MFr Bld     <5.7 % 5.6  Mean Plasma Glucose     <117 mg/dL 114    PHYSICAL EXAM  Temp:  [98.1 F (36.7 C)-98.8 F (37.1 C)] 98.7 F (37.1 C) (02/02 0800) Pulse Rate:  [73-103] 87 (02/02 1100) Resp:  [11-19] 18 (02/02 1100) BP: (128-190)/(57-87) 159/73 mmHg (02/02 1100) SpO2:  [97 %-99 %] 99 % (02/02 1100) Weight:  [123 lb 14.4 oz (56.2 kg)] 123 lb 14.4 oz (56.2 kg) (02/02 0400)  General - Well nourished, well developed,  not in acute distress.  Ophthalmologic - not cooperate due to photophobia.  Cardiovascular - Regular rate and rhythm with no murmur.  Mental Status -  Level of arousal and orientation to time, place, and person were intact. Language including expression, naming, repetition, comprehension was assessed and found intact. Following commands.  Cranial Nerves II - XII - II - Visual field intact OU. III, IV, VI - Extraocular movements intact, but with bilateral nystagmus. V - Facial sensation intact bilaterally. VII - Facial movement intact bilaterally. VIII - Hearing & vestibular intact bilaterally, but with bilateral nystagmus. X - Palate elevates symmetrically. XI - Chin turning & shoulder shrug intact bilaterally. XII - Tongue protrusion intact.  Motor Strength - The patient's strength was normal in all extremities and pronator drift was absent.  Bulk was normal and fasciculations were absent.   Motor Tone - Muscle tone was assessed at the neck and appendages and was normal.  Reflexes - The patient's reflexes were 1+ in all extremities and she had no pathological reflexes.  Sensory - Light touch, temperature/pinprick, vibration and proprioception, and Romberg testing were assessed and were normal.    Coordination - The patient had mild dysmetria on the left with finger to nose testing.  Tremor was absent.  Gait and Station - not tested.   ASSESSMENT/PLAN Ms. Elizbeth Posa is a 81 y.o. female with history of hypertension, hyperlipidemia, dementia, nontraumatic cerebral hemorrhage and ureterocele presenting with new onset nausea and vomiting as well as dizziness and unstable gait. She did not receive IV t-PA due to delay in arrival.   Stroke:  left PICA infarct with petechial hemorrhage c/w chronic hypertension, felt to be embolic (? Afib) vs. Large vessel athero, at risk for neuro worsening given subtentorial location  Resultant  No focal deficit  MRI  L PICA infarct  MRA   Thready L PICA, R P2 high grade stenosis  CTA head and neck showed left VA athero and bilateral ICA athero  Repeat CT showed slightly decreased hydrocephalus -  taper off 3% saline  today  Carotid Doppler  No significant stenosis   2D Echo  unremarkable   HgbA1c 5.6  Lovenox 40 mg sq daily for VTE prophylaxis  Diet regular thin liquids  aspirin 81 mg orally every day prior to admission, now on clopidogrel 75 mg orally every day  Ongoing aggressive stroke risk factor management  No further embolic workup as pt not an anticoagulation candidate due to fall risk, dementia  Therapy recommendations:  CIR   Disposition:  Pending CIR.  Hydrocephalus - series CT showed developing hydrocephalus 02/05/14 - Repeat CAT scan today shows slightly decreased hydrocephalus - neuro stable at this time - NSG on board  - close monitoring - Taper off 3% saline  Hypertension  Home meds - amlodipine Blood pressure goal normotensive  On the high side  Increased amlodipine 5 mg twice a day  Hyperlipidemia  Home meds:  lipitor 80 resumed in hospital  LDL 71, goal < 70  Continue statin at discharge  Malnutrition  Encourage increase po intake  Ensure for oral supplements.  Diet regular  Other Stroke Risk Factors  Advanced age  Hx stroke/TIA - hx small L cerebellar infarct 2011; progression L frontal micro hemorrhages since 2009 (? Amyloid)  Family hx stroke (mother)  Other Active Problems  Baseline dementia - ? amyloid  Collapsed vertebrae, requires help with ADLs, recently staying with daughter who helps provide care  Hospital day # 8  Rosalin Hawking, MD PhD Stroke Neurology 02/08/2014 12:08 PM    To contact Stroke Continuity provider, please refer to [email or web address] After hours, contact General Neurology

## 2014-02-08 NOTE — Progress Notes (Signed)
Pt seen and examined. No issues overnight. No new complaints.   EXAM: Temp:  [98.1 F (36.7 C)-98.8 F (37.1 C)] 98.7 F (37.1 C) (02/02 0800) Pulse Rate:  [73-103] 87 (02/02 1100) Resp:  [11-19] 18 (02/02 1100) BP: (128-190)/(57-87) 159/73 mmHg (02/02 1100) SpO2:  [97 %-99 %] 99 % (02/02 1100) Weight:  [56.2 kg (123 lb 14.4 oz)] 56.2 kg (123 lb 14.4 oz) (02/02 0400) Intake/Output      02/01 0701 - 02/02 0700 02/02 0701 - 02/03 0700   P.O. 300    I.V. (mL/kg) 1800 (32) 253.1 (4.5)   IV Piggyback 300 25   Total Intake(mL/kg) 2400 (42.7) 278.1 (4.9)   Urine (mL/kg/hr) 3355 (2.5) 615 (2.1)   Stool 0 (0)    Total Output 3355 615   Net -955 -336.9        Stool Occurrence 1 x     Awake, alert, oriented Speech fluent Good strength throughout  LABS: Lab Results  Component Value Date   CREATININE 0.67 02/08/2014   BUN 6 02/08/2014   NA 147* 02/08/2014   K 2.9* 02/08/2014   CL 117* 02/08/2014   CO2 23 02/08/2014   Lab Results  Component Value Date   WBC 8.4 02/08/2014   HGB 11.3* 02/08/2014   HCT 31.2* 02/08/2014   MCV 92.6 02/08/2014   PLT 203 02/08/2014    IMPRESSION: - 80 y.o. female with improving mass effect from [facility] stroke, does not need ventricular drainage.  PLAN: - Cont to wean off 3% - stable for transfer out of ICU and to CIR from neurosurgical standpoint.

## 2014-02-08 NOTE — Progress Notes (Signed)
[facility] TEAM 1 - Stepdown/ICU TEAM Progress Note  Fatiha Guzy SAY:[id number] DOB: 02/23/1933 DOA: 01/31/2014 PCP: Abigail Miyamoto, MD  Admit HPI / Brief Narrative: [name] is an 81 y.o. WF PMHx hypertension, hyperlipidemia, dementia, nontraumatic cerebral hemorrhage and ureterocele. Presented with new onset vertigo with nausea and vomiting as well as unstable gait. Patient was last known well at 10 PM last night 01/30/2014 ([facility]). She noticed symptoms when she attempted to sit on the side of her bed at 4 AM. She's had no change in speech. No focal weakness has been noted. She said no visual changes. She's been taking aspirin daily. CT scan of her head showed nonhemorrhagic infarction involving a large portion of the left cerebral hemisphere inferiorly as well as a small portion of the right cerebellar hemisphere inferomedially. There was slight mass effect on fourth ventricle. NIH stroke score was 0. Patient was not administered TPA secondary to beyond time window for treatment consideration. She was admitted for further evaluation and treatment.    HPI/Subjective: 2/2 awake, joking with daughter and medical staff, A/O 4, states nausea resolved. Negative CP, negative SOB, negative headache.   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 Consuella Lose (neurosurgery), who will evaluate patient for possible shunt vs watchful waiting for patients increasing pressures by CT scan -2/1 follow-up head CT; stable with slight decrease in fourth ventricle pressure  Increased cerebral pressure/effacement fourth ventricle -See acute CVA -Continue following Dr. Consuella Lose (neurosurgery), recommendations; scheduled for head CT in the  a.m., -2/1 head CT shows decreasing pressure  -Hypertonic saline per neurology (tapering off)  Hypertension -Continue hydralazine 10 mg  TID PRN SBP > 190 -Amlodipine 5 mg BID  Hyperlipidemia -Lipid panel within AHA guidelines - Continue Lipitor 80 mg daily   Refractory nausea -Continue Thorazine 12.5 mg TID 30 min prior to meals; may increase to 25 mg TID if does not drop patient's BP.  -Per patient nausea significantly decreased today  Dementia -At baseline  Hypokalemia -Potassium 40 mEq  2 doses -Check magnesium in a.m.  Moderate Malnutrition -Patient and family agrees that NG tube will be inserted in the a.m. if patient unable to consume at least 50% of her meals; meal from outside being brought in today, an effort to encourage patient to eat. -ADDENDUM contacted by RN that patient was attempting to eat but having difficulty swallowing, ordered swallow study    Code Status: FULL Family Communication: None    Disposition Plan: CIR vs SNF     Consultants: Dr.Pramod Sethi (neurology stroke team) Dr. Consuella Lose (neurosurgery)    Procedure/Significant Events: 1/25 CT head without contrast;- wedge-shaped low-attenuation involving the inferior left cerebellar hemisphere.- smaller focus of low attenuation in the medial inferior right cerebellar hemisphere.  -likely represent subacute nonhemorrhagic infarctions.  -mild to moderate chronic microvascular ischemic change in the deep white matter. 1/26 MRA/MRI brain;MRA HEAD FINDINGS - Patent anterior communicating artery.  -No large vessel occlusion, high-grade stenosis, aneurysm. -Posterior circulation: Mid grade stenosis of RIGHT P 2 segment, with compensatory diminutive RIGHT P1 segment, robust  -No large vessel occlusion, high-grade stenosis, aneurysm.  MRI HEAD:- Acute LEFT posterior inferior cerebellar artery territory infarct with petechial hemorrhage. -Scattered foci of susceptibility artifact in a pattern  suggesting sequelae of chronic  hypertension. -Thready irregular LEFT posterior-inferior cerebellar artery, this could reflect atherosclerosis or mass effect from acute infarct. 1/26 carotid duplex; Bilateral carotid artery duplex completed: 1-39% ICA stenosis. Vertebral artery flow is antegrade.  1/26 echocardiogram;- LVEF= 55% to 60%. - (grade 1 diastolic dysfunction). 1/30 CT head without contrast; Evolving LEFT posterior inferior cerebellar artery territory infarct with worsening edema, progressive fourth ventricle and cerebral aqueduct effacement resulting in early obstructive hydrocephalus. -Worsening sphenoid sinusitis. 2/1 CT head without contrast; Confluent left PICA territory with petechial hemorrhage, edema, and posterior fossa mass effect. Stable partially effaced basilarcisterns. -mild regression o the acute ventriculomegaly  Culture NA  Antibiotics: NA  DVT prophylaxis: SCD   Devices NA   LINES / TUBES:      Continuous Infusions: . sodium chloride (hypertonic) 75 mL/hr (02/08/14 1653)    Objective: VITAL SIGNS: Temp: 98.5 F (36.9 C) (02/02 1600) Temp Source: Oral (02/02 1600) BP: 161/64 mmHg (02/02 1600) Pulse Rate: 91 (02/02 1600) SPO2; FIO2:   Intake/Output Summary (Last 24 hours) at 02/08/14 1706 Last data filed at 02/08/14 1600  Gross per 24 hour  Intake 1965.63 ml  Output   2445 ml  Net -479.37 ml     Exam: General: A/O 4, awake, interactive, No acute respiratory distress Lungs: Clear to auscultation bilaterally without wheezes or crackles Cardiovascular: Regular rate and rhythm without murmur gallop or rub normal S1 and S2 Abdomen: Nontender, nondistended, soft, bowel sounds positive, no rebound, no ascites, no appreciable mass Extremities: No significant cyanosis, clubbing, or edema bilateral lower extremities Neurologic; did not perform full neuro checks. Patient was up with physical therapy today ambulate to chair without any  difficulty per RN, moves all extremities to command        Data Reviewed: Basic Metabolic Panel:  Recent Labs Lab 02/02/14 0339 02/03/14 0440 02/05/14 0255  02/06/14 0440  02/07/14 0510 02/07/14 1100 02/07/14 1700 02/07/14 2300 02/08/14 0455 02/08/14 1123  NA 130* 132* 134*  < > 137  < > 144 145 146* 145 147* 145  K 3.9 3.7 3.2*  --  3.0*  --  3.0*  --   --   --  2.9*  --   CL 99 98 103  --  103  --  112  --   --   --  117*  --   CO2 22 26 26   --  24  --  27  --   --   --  23  --   GLUCOSE 109* 93 87  --  97  --  101*  --   --   --  116*  --   BUN 8 11 11   --  6  --  6  --   --   --  6  --   CREATININE 0.93 0.84 0.82  --  0.82  --  0.72  --   --   --  0.67  --   CALCIUM 8.5 8.6 8.4  --  8.3*  --  8.4  --   --   --  8.1*  --   MG 1.7  --  1.8  --  1.7  --  1.6  --   --   --   --   --   < > = values in this interval not displayed. Liver Function Tests:  Recent Labs Lab 02/02/14 0339 02/03/14 0440 02/05/14 0255 02/06/14 0440 02/07/14 0510  AST 26 31 28  54* 67*  ALT 20 20 17  39*  59*  ALKPHOS 127* 120* 101 111 96  BILITOT 1.0 1.0 0.7 1.0 0.8  PROT 6.0 6.1 5.2* 5.8* 5.2*  ALBUMIN 3.3* 3.2* 2.8* 3.0* 2.7*   No results for input(s): LIPASE, AMYLASE in the last 168 hours. No results for input(s): AMMONIA in the last 168 hours. CBC:  Recent Labs Lab 02/02/14 0339 02/05/14 0255 02/06/14 0440 02/07/14 0510 02/08/14 0455  WBC 10.0 5.5 6.0 5.6 8.4  NEUTROABS 8.6*  --   --   --   --   HGB 12.2 11.9* 12.6 11.1* 11.3*  HCT 34.6* 32.8* 35.2* 31.2* 31.2*  MCV 90.6 89.4 90.0 91.0 92.6  PLT 234 213 211 208 203   Cardiac Enzymes: No results for input(s): CKTOTAL, CKMB, CKMBINDEX, TROPONINI in the last 168 hours. BNP (last 3 results) No results for input(s): PROBNP in the last 8760 hours. CBG:  Recent Labs Lab 02/07/14 2007 02/07/14 2338  GLUCAP 135* 116*    Recent Results (from the past 240 hour(s))  MRSA PCR Screening     Status: None   Collection Time:  01/31/14  8:04 PM  Result Value Ref Range Status   MRSA by PCR NEGATIVE NEGATIVE Final    Comment:        The GeneXpert MRSA Assay (FDA approved for NASAL specimens only), is one component of a comprehensive MRSA colonization surveillance program. It is not intended to diagnose MRSA infection nor to guide or monitor treatment for MRSA infections.      Studies:  Recent x-ray studies have been reviewed in detail by the Attending Physician  Scheduled Meds:  Scheduled Meds: . amLODipine  5 mg Oral BID  . atorvastatin  80 mg Oral q morning - 10a  . chlorproMAZINE (THORAZINE) IV  12.5 mg Intravenous TID AC  . cholecalciferol  1,000 Units Oral BID  . clopidogrel  75 mg Oral Daily  . enoxaparin (LOVENOX) injection  40 mg Subcutaneous Q24H  . feeding supplement (RESOURCE BREEZE)  1 Container Oral TID BM  . multivitamin with minerals  1 tablet Oral Daily  . pantoprazole  40 mg Oral Daily  . senna-docusate  1 tablet Oral BID  . vitamin B-12  1,000 mcg Oral Daily    Time spent on care of this patient: 40 mins   Allie Bossier [facility]  [facility] Office  [phone] Pager - [phone]  On-Call/Text Page:      [email or web address]      password [email or web address]  If 7PM-7AM, please contact night-coverage [email or web address] Password [email or web address] 02/08/2014, 5:06 PM   LOS: 8 days   Care during the described time interval was provided by me . I have reviewed this patient's available data, including medical history, events of note, physical examination, radiology studies and test results as part of my evaluation  Dia Crawford, MD [phone] Pager

## 2014-02-08 NOTE — Progress Notes (Addendum)
Occupational Therapy Treatment Patient Details Name: [name] MRN: [id number] DOB: 1933-08-21 Today's Date: 02/08/2014    History of present illness 81 y.o. WF PMHx hypertension, hyperlipidemia, dementia, nontraumatic cerebral hemorrhage and ureterocele. Presented with new onset vertigo with nausea and vomiting as well as unstable gait. 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   OT comments  This 81 yo female admitted with above presents to acute OT today making progress towards goals and will greatly benefit from inpatient rehab to get to a Mod I/Independent level.  Follow Up Recommendations  CIR;Supervision/Assistance - 24 hour    Equipment Recommendations   (TBD next venue)       Precautions / Restrictions Precautions Precautions: Fall Restrictions Weight Bearing Restrictions: No       Mobility Bed Mobility Overal bed mobility: Needs Assistance Bed Mobility: Sit to Supine       Sit to supine: Mod assist   General bed mobility comments: with cues for sequence  Transfers Overall transfer level: Needs assistance Equipment used: 2 person hand held assist Transfers: Sit to/from Stand;Stand Pivot Transfers Sit to Stand: Min assist;+2 physical assistance Stand pivot transfers: +2 physical assistance;Min assist                ADL Overall ADL's : Needs assistance/impaired     Grooming: Wash/dry face;Brushing hair;Set up;Supervision/safety;Sitting Grooming Details (indicate cue type and reason): in recliner                 Toilet Transfer: Minimal assistance;+2 for physical assistance;Stand-pivot (from recliner to bed going to pt's left)                              Cognition   Behavior During Therapy: [facility] for tasks assessed/performed Overall Cognitive Status: History of cognitive impairments - at baseline                                    Pertinent Vitals/ Pain       Pain  Assessment: No/denies pain  Home Living Family/patient expects to be discharged to:: Inpatient rehab Living Arrangements: Children Available Help at Discharge: Family Type of Home: House       Home Layout: Able to live on main level with bedroom/bathroom               Home Equipment: Walker - 2 wheels          Prior Functioning/Environment Level of Independence: Needs assistance  Gait / Transfers Assistance Needed: ambulated with RW, had assist for bed mobility secondary to recent compression fracture in spine         Frequency Min 3X/week     Progress Toward Goals  OT Goals(current goals can now be found in the care plan section)  Progress towards OT goals: Progressing toward goals     Plan Discharge plan remains appropriate       End of Session Equipment Utilized During Treatment: Gait belt   Activity Tolerance Patient tolerated treatment well   Patient Left in bed;with call bell/phone within reach;with bed alarm set           Time: 1417-1433 OT Time Calculation (min): 16 min  Charges: OT General Charges $OT Visit: 1 Procedure OT Treatments $Self Care/Home Management : 8-22 mins  [name] [phone] 02/08/2014, 3:54 PM

## 2014-02-09 ENCOUNTER — Inpatient Hospital Stay (HOSPITAL_COMMUNITY)
Admission: RE | Admit: 2014-02-09 | Discharge: 2014-02-23 | DRG: 057 | Disposition: A | Discharge status: Home / Self Care | Payer: Medicare Other | Source: Intra-hospital | Attending: Physical Medicine & Rehabilitation | Admitting: Physical Medicine & Rehabilitation

## 2014-02-09 DIAGNOSIS — K219 Gastro-esophageal reflux disease without esophagitis: Secondary | ICD-10-CM | POA: Diagnosis present

## 2014-02-09 DIAGNOSIS — R42 Dizziness and giddiness: Secondary | ICD-10-CM | POA: Diagnosis present

## 2014-02-09 DIAGNOSIS — I6521 Occlusion and stenosis of right carotid artery: Secondary | ICD-10-CM | POA: Diagnosis present

## 2014-02-09 DIAGNOSIS — E785 Hyperlipidemia, unspecified: Secondary | ICD-10-CM | POA: Diagnosis present

## 2014-02-09 DIAGNOSIS — Z880 Allergy status to penicillin: Secondary | ICD-10-CM | POA: Diagnosis not present

## 2014-02-09 DIAGNOSIS — F0390 Unspecified dementia without behavioral disturbance: Secondary | ICD-10-CM | POA: Diagnosis present

## 2014-02-09 DIAGNOSIS — Z91040 Latex allergy status: Secondary | ICD-10-CM

## 2014-02-09 DIAGNOSIS — I10 Essential (primary) hypertension: Secondary | ICD-10-CM | POA: Diagnosis present

## 2014-02-09 DIAGNOSIS — F5089 Other specified eating disorder: Secondary | ICD-10-CM | POA: Diagnosis present

## 2014-02-09 DIAGNOSIS — M810 Age-related osteoporosis without current pathological fracture: Secondary | ICD-10-CM | POA: Diagnosis present

## 2014-02-09 DIAGNOSIS — Z79899 Other long term (current) drug therapy: Secondary | ICD-10-CM

## 2014-02-09 DIAGNOSIS — Z87891 Personal history of nicotine dependence: Secondary | ICD-10-CM | POA: Diagnosis not present

## 2014-02-09 DIAGNOSIS — Z7982 Long term (current) use of aspirin: Secondary | ICD-10-CM | POA: Diagnosis not present

## 2014-02-09 DIAGNOSIS — I69093 Ataxia following nontraumatic subarachnoid hemorrhage: Principal | ICD-10-CM | POA: Diagnosis present

## 2014-02-09 DIAGNOSIS — I63542 Cerebral infarction due to unspecified occlusion or stenosis of left cerebellar artery: Secondary | ICD-10-CM | POA: Insufficient documentation

## 2014-02-09 LAB — CREATININE, SERUM
CREATININE: 0.82 mg/dL (ref 0.50–1.10)
GFR calc non Af Amer: 66 mL/min — ABNORMAL LOW (ref >90)
GFR, EST AFRICAN AMERICAN: 77 mL/min — AB (ref >90)

## 2014-02-09 LAB — CBC
HCT: 30.1 % — ABNORMAL LOW (ref 36.0–46.0)
HEMATOCRIT: 31 % — AB (ref 36.0–46.0)
HEMOGLOBIN: 10.9 g/dL — AB (ref 12.0–15.0)
Hemoglobin: 10.8 g/dL — ABNORMAL LOW (ref 12.0–15.0)
MCH: 32.4 pg (ref 26.0–34.0)
MCH: 32.2 pg (ref 26.0–34.0)
MCHC: 35.9 g/dL (ref 30.0–36.0)
MCHC: 35.2 g/dL (ref 30.0–36.0)
MCV: 90.4 fL (ref 78.0–100.0)
MCV: 91.4 fL (ref 78.0–100.0)
Platelets: 191 10*3/uL (ref 150–400)
Platelets: 190 10*3/uL (ref 150–400)
RBC: 3.33 MIL/uL — AB (ref 3.87–5.11)
RBC: 3.39 MIL/uL — AB (ref 3.87–5.11)
RDW: 12.5 % (ref 11.5–15.5)
RDW: 12.5 % (ref 11.5–15.5)
WBC: 10.1 10*3/uL (ref 4.0–10.5)
WBC: 10.3 10*3/uL (ref 4.0–10.5)

## 2014-02-09 LAB — BASIC METABOLIC PANEL
Anion gap: 6 (ref 5–15)
BUN: 6 mg/dL (ref 6–23)
CO2: 27 mmol/L (ref 19–32)
Calcium: 8.5 mg/dL (ref 8.4–10.5)
Chloride: 107 mmol/L (ref 96–112)
Creatinine, Ser: 0.66 mg/dL (ref 0.50–1.10)
GFR calc Af Amer: >90 mL/min (ref >90)
GFR calc non Af Amer: 82 mL/min — ABNORMAL LOW (ref >90)
GLUCOSE: 116 mg/dL — AB (ref 70–99)
Potassium: 3 mmol/L — ABNORMAL LOW (ref 3.5–5.1)
Sodium: 140 mmol/L (ref 135–145)

## 2014-02-09 LAB — SODIUM: Sodium: 140 mmol/L (ref 135–145)

## 2014-02-09 LAB — MAGNESIUM: MAGNESIUM: 1.7 mg/dL (ref 1.5–2.5)

## 2014-02-09 MED ORDER — VITAMIN B-12 1000 MCG PO TABS
1000.0000 ug | ORAL_TABLET | Freq: Every day | ORAL | Status: DC
  Administered 2014-02-10 – 2014-02-23 (×14): 1000 ug via ORAL
  Filled 2014-02-09 (×15): qty 1

## 2014-02-09 MED ORDER — CLOPIDOGREL BISULFATE 75 MG PO TABS
75.0000 mg | ORAL_TABLET | Freq: Every day | ORAL | Status: DC
  Administered 2014-02-10 – 2014-02-23 (×14): 75 mg via ORAL
  Filled 2014-02-09 (×15): qty 1

## 2014-02-09 MED ORDER — BOOST / RESOURCE BREEZE PO LIQD
1.0000 | Freq: Three times a day (TID) | ORAL | Status: DC
  Administered 2014-02-09 – 2014-02-15 (×5): 1 via ORAL

## 2014-02-09 MED ORDER — ONDANSETRON HCL 4 MG PO TABS
4.0000 mg | ORAL_TABLET | Freq: Four times a day (QID) | ORAL | Status: DC | PRN
  Administered 2014-02-22: 4 mg via ORAL
  Filled 2014-02-09: qty 1

## 2014-02-09 MED ORDER — ENOXAPARIN SODIUM 40 MG/0.4ML ~~LOC~~ SOLN
40.0000 mg | SUBCUTANEOUS | Status: DC
  Administered 2014-02-10 – 2014-02-14 (×5): 40 mg via SUBCUTANEOUS
  Filled 2014-02-09 (×7): qty 0.4

## 2014-02-09 MED ORDER — VITAMIN D3 25 MCG (1000 UNIT) PO TABS
1000.0000 [IU] | ORAL_TABLET | Freq: Two times a day (BID) | ORAL | Status: DC
  Administered 2014-02-09 – 2014-02-23 (×28): 1000 [IU] via ORAL
  Filled 2014-02-09 (×30): qty 1

## 2014-02-09 MED ORDER — AMLODIPINE BESYLATE 5 MG PO TABS
5.0000 mg | ORAL_TABLET | Freq: Every day | ORAL | Status: DC
  Filled 2014-02-09: qty 1

## 2014-02-09 MED ORDER — IRBESARTAN 300 MG PO TABS
300.0000 mg | ORAL_TABLET | Freq: Every day | ORAL | Status: DC
  Administered 2014-02-10 – 2014-02-16 (×7): 300 mg via ORAL
  Filled 2014-02-09 (×8): qty 1

## 2014-02-09 MED ORDER — OXYCODONE-ACETAMINOPHEN 5-325 MG PO TABS
1.0000 | ORAL_TABLET | Freq: Four times a day (QID) | ORAL | Status: DC | PRN
Start: 2014-02-09 — End: 2014-02-23

## 2014-02-09 MED ORDER — SENNOSIDES-DOCUSATE SODIUM 8.6-50 MG PO TABS
1.0000 | ORAL_TABLET | Freq: Two times a day (BID) | ORAL | Status: DC
  Administered 2014-02-09 – 2014-02-21 (×24): 1 via ORAL
  Filled 2014-02-09 (×24): qty 1

## 2014-02-09 MED ORDER — SORBITOL 70 % SOLN
30.0000 mL | Freq: Every day | Status: DC | PRN
  Administered 2014-02-16 – 2014-02-21 (×2): 30 mL via ORAL
  Filled 2014-02-09 (×2): qty 30

## 2014-02-09 MED ORDER — ACETAMINOPHEN 325 MG PO TABS: 650.0000 mg | ORAL_TABLET | Freq: Four times a day (QID) | ORAL | Status: DC | PRN

## 2014-02-09 MED ORDER — AMLODIPINE BESYLATE 5 MG PO TABS
5.0000 mg | ORAL_TABLET | Freq: Every day | ORAL | Status: DC
  Administered 2014-02-10 – 2014-02-16 (×7): 5 mg via ORAL
  Filled 2014-02-09 (×8): qty 1

## 2014-02-09 MED ORDER — ENOXAPARIN SODIUM 40 MG/0.4ML ~~LOC~~ SOLN: 40.0000 mg | SUBCUTANEOUS | Status: DC

## 2014-02-09 MED ORDER — MECLIZINE HCL 25 MG PO TABS
25.0000 mg | ORAL_TABLET | Freq: Three times a day (TID) | ORAL | Status: DC | PRN
Start: 2014-02-09 — End: 2014-02-23
  Administered 2014-02-09 – 2014-02-10 (×2): 25 mg via ORAL
  Filled 2014-02-09 (×4): qty 1

## 2014-02-09 MED ORDER — ADULT MULTIVITAMIN W/MINERALS CH
1.0000 | ORAL_TABLET | Freq: Every day | ORAL | Status: DC
  Administered 2014-02-10 – 2014-02-23 (×14): 1 via ORAL
  Filled 2014-02-09 (×15): qty 1

## 2014-02-09 MED ORDER — BISACODYL 10 MG RE SUPP: 10.0000 mg | Freq: Every day | RECTAL | Status: DC | PRN

## 2014-02-09 MED ORDER — CLOPIDOGREL BISULFATE 75 MG PO TABS
75.0000 mg | ORAL_TABLET | Freq: Once | ORAL | Status: AC
  Administered 2014-02-09: 75 mg via ORAL
  Filled 2014-02-09: qty 1

## 2014-02-09 MED ORDER — AMLODIPINE-OLMESARTAN 5-40 MG PO TABS
1.0000 | ORAL_TABLET | Freq: Every day | ORAL | Status: DC
Start: 2014-02-09 — End: 2014-02-09

## 2014-02-09 MED ORDER — SCOPOLAMINE 1 MG/3DAYS TD PT72
1.0000 | MEDICATED_PATCH | TRANSDERMAL | Status: DC
  Administered 2014-02-09 – 2014-02-15 (×3): 1.5 mg via TRANSDERMAL
  Filled 2014-02-09 (×3): qty 1

## 2014-02-09 MED ORDER — IRBESARTAN 300 MG PO TABS
300.0000 mg | ORAL_TABLET | Freq: Every day | ORAL | Status: DC
  Filled 2014-02-09: qty 1

## 2014-02-09 MED ORDER — SODIUM CHLORIDE 0.9 % IV SOLN
12.5000 mg | Freq: Four times a day (QID) | INTRAVENOUS | Status: DC | PRN
  Filled 2014-02-09: qty 0.5

## 2014-02-09 MED ORDER — PANTOPRAZOLE SODIUM 40 MG PO TBEC
40.0000 mg | DELAYED_RELEASE_TABLET | Freq: Every day | ORAL | Status: DC
  Administered 2014-02-10: 40 mg via ORAL
  Filled 2014-02-09 (×2): qty 1

## 2014-02-09 MED ORDER — SENNOSIDES-DOCUSATE SODIUM 8.6-50 MG PO TABS
1.0000 | ORAL_TABLET | Freq: Every evening | ORAL | Status: DC | PRN
Start: 2014-02-09 — End: 2014-02-23
  Administered 2014-02-12: 1 via ORAL

## 2014-02-09 MED ORDER — POTASSIUM CHLORIDE CRYS ER 20 MEQ PO TBCR
40.0000 meq | EXTENDED_RELEASE_TABLET | Freq: Once | ORAL | Status: DC
Start: 2014-02-09 — End: 2014-02-09

## 2014-02-09 MED ORDER — ONDANSETRON HCL 4 MG/2ML IJ SOLN: 4.0000 mg | Freq: Four times a day (QID) | INTRAMUSCULAR | Status: DC | PRN

## 2014-02-09 NOTE — Progress Notes (Signed)
PT NOTE Orthostatic BPs  Supine 151/50, 89 bpm  Sitting 97/47, 108 bpm  Standing 82/64, 119 bpm  Standing after 3 min Would not register   HR 88-118 bpm 97% O2 on RA MD aware of orthostasis. Thanks. [place] [phone] (pager)

## 2014-02-09 NOTE — PMR Pre-admission (Signed)
PMR Admission Coordinator Pre-Admission Assessment  Patient: [name] is an 81 y.o., female MRN: [id number] DOB: 1933/03/27 Height: _0  (162.6 cm) Weight: 56.2 kg (123 lb 14.4 oz)              Insurance Information HMO: Yes    PPO:       PCP:       IPA:       80/20:       OTHER:  Group #[id number] PRIMARY: Blue Medicare      Policy#: [id number]      Subscriber: [name] CM Name: Josephina Gip      Phone#: [phone]     Fax#: [phone] Pre-Cert#:                           Employer: Retired Benefits:  Phone #: [phone]     Name: Automated Eff. Date: )01/07/14     Deduct:  $0      Out of Pocket Max: $3950 ( met $40)      Life Max: unlimited CIR:        SNF: $75 days 1-40; $100 days 41-60; $60 days 61-100 Outpatient: medical necessity     Co-Pay: $40/visit Home Health: 100%      Co-Pay: none DME: 80%     Co-Pay: 20% Providers: in network  Emergency Contact Information Contact Information    Name Relation Home Work Mobile   [place] Daughter [phone]  [phone]   B[place]ling,David Relative   [phone]     Current Medical History  Patient Admitting Diagnosis:  L PICA infarct  History of Present Illness: A 81 y.o.right handed female with history of hypertension, dementia, osteoporosis, nontraumatic cerebral hemorrhage 2011.Marland Kitchen Patient lives with family used a rolling walker prior to admission. Presented 01/31/2014 with nausea vomiting as well as dizziness and unsteady gait. MRI showed acute left posterior inferior cerebellar artery territory infarct with petechial hemorrhage. MRA with mid stenosis right P2 segment with luminal irregularity of the bilat posterior cerebral arteries consistent with atherosclerosis. Echocardiogram with ejection fraction 76% grade 1 diastolic dysfunction. Carotid Doppler no ICA stenosis. Patient did not receive TPA. CT angiogram head and neck 02/03/2014 shows acute infarct left inferior cerebellum in PICA territory with mass effect on  the fourth ventricle. No obstructive hydrocephalus or hemorrhage. Bilateral carotid atherosclerotic disease noted. Moderately severe stenosis proximal right external carotid artery. 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 Follow up CT Head 02/07/2014 with stable appearance, ventriculomegaly slightly improved. Neurology consulted presently on Plavix for CVA prophylaxis with the addition of Lovenox for DVT prophylaxis. Bouts of hypokalemia resolved with potassium supplement. Intermittent bouts of refractory nausea/dizziness and no response with Thorazine or Phenergan. Latest CT abdomen and pelvis unremarkable. Tolerating a dysphagia 2 thin liquid diet. Physical and occupational therapy evaluations completed 02/01/2014 recommendations of physical medicine rehabilitation consult. Patient to be admitted for comprehensive inpatient rehabilitation program.  Note:  Patient got up with PT today and had an episode of nausea, vomiting, drop in BP.  She has been seen by attending and neurology and has been cleared for rehab today.  We will need to progress slowly with therapies as patient tolerance increases each day.   Total: 0=NIH  Past Medical History  Past Medical History  Diagnosis Date  . Hypertension   . Hyperlipemia   . Dementia   .  Cerebral hemorrhage, nontraumatic 2011  . CVA (cerebral infarction) 2011    left cerebellum  . Ureterocele, acquired     Family History  family history is not on file.  Prior Rehab/Hospitalizations:  No previous rehab admissions.   Current Medications   Current facility-administered medications:  .  acetaminophen (TYLENOL) tablet 650 mg, 650 mg, Oral, Q6H PRN, Rhetta Mura Schorr, NP, 650 mg at 02/04/14 1552 .  amLODipine (NORVASC) tablet 5 mg, 5 mg, Oral, Daily, 5 mg at 02/09/14 1015 **AND** irbesartan (AVAPRO) tablet 300 mg, 300 mg,  Oral, Daily, Cherene Altes, MD, 300 mg at 02/09/14 1015 .  bisacodyl (DULCOLAX) suppository 10 mg, 10 mg, Rectal, Daily PRN, Donzetta Starch, NP, 10 mg at 02/02/14 1031 .  chlorproMAZINE (THORAZINE) 12.5 mg in sodium chloride 0.9 % 25 mL IVPB, 12.5 mg, Intravenous, Q6H PRN, Cherene Altes, MD .  cholecalciferol (VITAMIN D) tablet 1,000 Units, 1,000 Units, Oral, BID, Kinnie Feil, MD, 1,000 Units at 02/07/14 2124 .  clopidogrel (PLAVIX) tablet 75 mg, 75 mg, Oral, Daily, Donzetta Starch, NP, 75 mg at 02/08/14 4696 .  enoxaparin (LOVENOX) injection 40 mg, 40 mg, Subcutaneous, Q24H, Kinnie Feil, MD, 40 mg at 02/08/14 2259 .  feeding supplement (RESOURCE BREEZE) (RESOURCE BREEZE) liquid 1 Container, 1 Container, Oral, TID BM, Heather Cornelison Pitts, RD, 1 Container at 02/08/14 2013 .  multivitamin with minerals tablet 1 tablet, 1 tablet, Oral, Daily, Kinnie Feil, MD, 1 tablet at 02/06/14 1033 .  ondansetron (ZOFRAN) injection 4 mg, 4 mg, Intravenous, Q4H PRN, Cherene Altes, MD, 4 mg at 02/09/14 0951 .  oxyCODONE-acetaminophen (PERCOCET/ROXICET) 5-325 MG per tablet 1 tablet, 1 tablet, Oral, Q6H PRN, Kinnie Feil, MD .  pantoprazole (PROTONIX) EC tablet 40 mg, 40 mg, Oral, Daily, Kinnie Feil, MD, 40 mg at 02/08/14 0938 .  potassium chloride SA (K-DUR,KLOR-CON) CR tablet 40 mEq, 40 mEq, Oral, Once, Cherene Altes, MD .  senna-docusate (Senokot-S) tablet 1 tablet, 1 tablet, Oral, QHS PRN, Kinnie Feil, MD, 0 tablet at 02/04/14 2153 .  senna-docusate (Senokot-S) tablet 1 tablet, 1 tablet, Oral, BID, Cherene Altes, MD, 1 tablet at 02/07/14 2125 .  vitamin B-12 (CYANOCOBALAMIN) tablet 1,000 mcg, 1,000 mcg, Oral, Daily, Kinnie Feil, MD, 1,000 mcg at 02/06/14 1031  Patients Current Diet: DIET DYS 2  Precautions / Restrictions Precautions Precautions: Fall Restrictions Weight Bearing Restrictions: No   Prior Activity Level Household: Was homebound PTA getting  out about 1 X a week for groceries with her daughter.   Home Assistive Devices / Equipment Home Assistive Devices/Equipment: Wheelchair, Environmental consultant (specify type) Home Equipment: Walker - 2 wheels  Prior Functional Level Prior Function Level of Independence: Needs assistance Gait / Transfers Assistance Needed: ambulated with RW, had assist for bed mobility secondary to recent compression fracture in spine ADL's / Homemaking Assistance Needed: family reports they were assisting with ADLs since her compression fracture.  She had been staying with her daughter for two weeks using a wheel chair out of the house and a walker in daughter's home.  Husband has been staying with their son.  Current Functional Level Cognition  Overall Cognitive Status: History of cognitive impairments - at baseline Orientation Level: Oriented X4    Extremity Assessment (includes Sensation/Coordination)  Upper Extremity Assessment: Generalized weakness  Lower Extremity Assessment: Generalized weakness    ADLs  Overall ADL's : Needs assistance/impaired Eating/Feeding: Sitting, Minimal assistance Eating/Feeding Details (indicate cue type and reason):  pt's daughter reports she has not been eating much due to nausea Grooming: Wash/dry face, Brushing hair, Set up, Supervision/safety, Sitting Grooming Details (indicate cue type and reason): in recliner Upper Body Bathing: Minimal assitance, Sitting Lower Body Bathing: Maximal assistance, Sit to/from stand Upper Body Dressing : Moderate assistance, Sitting Lower Body Dressing: Total assistance, +2 for physical assistance, Sit to/from stand Toilet Transfer: Minimal assistance, +2 for physical assistance, Stand-pivot (from recliner to bed going to pt's left) Toileting- Clothing Manipulation and Hygiene: Set up, Sitting/lateral lean Toileting - Clothing Manipulation Details (indicate cue type and reason): pt was able to use wet washcloth to perform hygiene while sitting on  BSC with lateral leans General ADL Comments: Pt was limited by lethargy this afternoon, however was motivated to get to [facility] for toileting after sitting EOB. Pt continues to c/o nausea but reports she is not currently dizzy.     Mobility  Overal bed mobility: Needs Assistance Bed Mobility: Supine to Sit, Sit to Supine Rolling: Mod assist Sidelying to sit: +2 for physical assistance, HOB elevated, Max assist Sit to supine: Mod assist Sit to sidelying: Mod assist General bed mobility comments: verbal cues for technique, assist to elevate trunk to upright, incr time to perform, nauseated with positional change.    Transfers  Overall transfer level: Needs assistance Equipment used: 2 person hand held assist Transfers: Sit to/from Stand Sit to Stand: Max assist, +2 physical assistance, From elevated surface Stand pivot transfers: +2 physical assistance, Min assist General transfer comment: cues for UE use, movement through pivot, max cueing for gaze stabilization.  pt notes less dizziness with coming to stand than wen came to sitting position.  Facilitation for upright posture and maintaining center of balance.      Ambulation / Gait / Stairs / Wheelchair Mobility  Ambulation/Gait Ambulation/Gait assistance: Mod assist, Max assist, +2 physical assistance Ambulation Distance (Feet): 6 Feet Assistive device: 2 person hand held assist Gait Pattern/deviations: Step-to pattern, Decreased stride length, Shuffle, Trunk flexed Gait velocity: decreased Gait velocity interpretation: Below normal speed for age/gender General Gait Details: limited by nausea, unable to determine normal gait pattern or possible ataxic coordination with gait due to limited overall mobility re: nausea.  Significant lean to right.  Target "A" did help pt with orientation but pt needed constant cues to keep eyes on target "A".  Pt was letting her body get ahead of her feet as well with very poor balance.  Was unable to progress  ambulation due to pt with poor postural stablity flexing more and more as she stood  and nauseated and requested to sit down.   called for nausea med and encouraged  pt to keep nausea med in her system so she can work with therapy.  This PT reiterated to daughter the importance of pt sitting up in chair for at least an hour 2 x day if possible.  Positioned pt comfortably in chair upon departure. Once pt in chair, called nurse to bring nausea med as pt said she was going to vomit.  Pt did vomit in basin and noted that pt looked like she was becoming lethargic.  Raised feet in recliner and check BP as pt was orthostatic.  MD made aware and to change some of pts medications.      Posture / Balance Dynamic Sitting Balance Sitting balance - Comments: pt with R lateral lean and needs kinesthetic input of being allowed to tip to R side for pt to realize she's leaning.  Max  cueing for use of "A" as target for gaze stabilization.  In sitting R Nystagmus noted.   Balance Overall balance assessment: Needs assistance, History of Falls Sitting-balance support: Bilateral upper extremity supported, Feet supported Sitting balance-Leahy Scale: Poor Sitting balance - Comments: pt with R lateral lean and needs kinesthetic input of being allowed to tip to R side for pt to realize she's leaning.  Max cueing for use of "A" as target for gaze stabilization.  In sitting R Nystagmus noted.   Postural control: Right lateral lean, Posterior lean Standing balance support: Bilateral upper extremity supported, During functional activity Standing balance-Leahy Scale: Poor Standing balance comment: Pt needs frequent cuing to use "A".  Needed constant facilitation ffor upright posture as pt flexed.   High level balance activites: Direction changes, Turns, Sudden stops, Head turns High Level Balance Comments: Pt can do none of the above.  Too unsteady.      Special needs/care consideration BiPAP/CPAP No CPM No Continuous Drip IV 3%  NS drip stopped this am 02/09/14. Dialysis No         Life Vest No Oxygen Yes, on 02 [place] in hospital Special Bed No Trach Size No Wound Vac (area) No      Skin No                             Bowel mgmt: Last BM 02/08/14 Bladder mgmt: Urinary catheter Diabetic mgmt No    Previous Home Environment Living Arrangements: Children Available Help at Discharge: Family Type of Home: House Home Layout: Able to live on main level with bedroom/bathroom Home Care Services: No  Discharge Living Setting Plans for Discharge Living Setting: House, Lives with (comment) (Plans to go home with her daughter.  Was staying with dtr.) Type of Home at Discharge: House Discharge Home Layout: One level Discharge Home Access: Stairs to enter Entrance Stairs-Number of Steps: 9 steps at front and 2 steps down off deck at the back entrance. Does the patient have any problems obtaining your medications?: No  Social/Family/Support Systems Patient Roles: Spouse, Parent (Has a husband, son and daughter.) Contact Information: Reche [place] - daughter  Anticipated Caregiver: daughter Anticipated Caregiver's Contact Information: [name] - dtr (h) [phone] (c) [phone] Ability/Limitations of Caregiver: Dtr may be able to stay with patient and work some from home. Caregiver Availability: 24/7 Discharge Plan Discussed with Primary Caregiver: Yes Is Caregiver In Agreement with Plan?: Yes Does Caregiver/Family have Issues with Lodging/Transportation while Pt is in Rehab?: No  Goals/Additional Needs Patient/Family Goal for Rehab: PT/OT S/min assist, ST mod I/S goals Expected length of stay: 15-20 days Cultural Considerations: [employer] Dietary Needs: Dys II, thin liquids Equipment Needs: TBD Pt/Family Agrees to Admission and willing to participate: Yes Program Orientation Provided & Reviewed with Pt/Caregiver Including Roles  & Responsibilities: Yes  Decrease burden of Care through IP rehab admission:  N/A  Possible need for SNF placement upon discharge: Yes, if patient does not progress to point where daughter can manage from home, may need SNF at the end of inpatient rehab stay.  Patient Condition: This patient's medical and functional status has changed since the consult dated: 02/03/14 in which the Rehabilitation Physician determined and documented that the patient's condition is appropriate for intensive rehabilitative care in an inpatient rehabilitation facility. See "History of Present Illness" (above) for medical update. Functional changes are: Currently requiring mod/max assist for transfers and amb 6 ft with +2 HHA. Patient's medical and functional  status update has been discussed with the Rehabilitation physician and patient remains appropriate for inpatient rehabilitation. Will admit to inpatient rehab today.  Preadmission Screen Completed By:  Retta Diones, 02/09/2014 11:06 AM ______________________________________________________________________   Discussed status with Dr. Naaman Plummer on 02/09/14 at 29 and received telephone approval for admission today.  Admission Coordinator:  Retta Diones, time1106/Date02/03/16

## 2014-02-09 NOTE — Discharge Summary (Signed)
DISCHARGE SUMMARY  [name]  MR#: [id number]  DOB:09/15/1933  Date of Admission: 01/31/2014 Date of Discharge: 02/09/2014  Attending Physician:Aalivia Mcgraw T  Patient's NWG:NFAOZ, Tammy Graff, MD  Consults: Stroke Team - Neurology  Neurosurgery  Disposition: D/C to CIR   Issues for monitoring in CIR: -K+ should be checked within 48hrs and further supplemented as indicated -LFTs should be checked in 48hrs - if normalized, consider resuming lipitor  Follow-up Appts: -Pt will need f/u appointment arranged w/ [facility] at the time of her D/C from CIR  -Pt will need f/u appointment arranged w/ her PCP at the time of her D/C from CIR   Discharge Diagnoses: Acute LEFT posterior inferior cerebellar artery territory CVA Increased cerebral pressure / effacement of 4th ventricle  Refractory nausea Hypertension Hyperlipidemia Dementia Hypokalemia / Hypomagnesemia Moderate Malnutrition  Initial presentation: 81 yo F w/ Hx hypertension, hyperlipidemia, dementia, nontraumatic cerebral hemorrhage, and ureterocele who presented with new onset vertigo with nausea and vomiting as well as unstable gait. Patient was last known well at 10 PM 01/30/2014. She noticed symptoms when she attempted to sit on the side of her bed at 4 AM 01/31/14. She had no change in speech. No focal weakness noted. No visual changes. She'd been taking aspirin daily. CT scan of head showed nonhemorrhagic infarction involving a large portion of the left cerebellar hemisphere inferiorly as well as a small portion of the right cerebellar hemisphere inferomedially. There was slight mass effect on the fourth ventricle. NIH stroke score was 0. Patient was not administered TPA secondary to being beyond time window for treatment consideration.   Hospital Course:  Acute LEFT posterior inferior cerebellar artery territory CVA -felt to be embolic (? Afib) - no further embolic workup as pt not an anticoagulation  candidate due to fall risk, dementia -Stroke Team followed - they suggested changing ASA to Plavix 75 mg daily -serial CT noted steady progression/worsening of edema w/ scan 1/30 noting new effacement of the 4th ventricle and early obstructive hydrocephalus  -1/30 Dr. Sherral Hammers spoke with Dr. Consuella Lose (Neurosurgery) who evaluated patient for possible shunt vs watchful waiting for patients increasing pressures by CT scan -Neurology began tx w/ 3% saline -2/1 f/u CT head noted improvement in hydrocephalus, and the pt was able to be successfully transitioned off hypertonic saline  -pt was cleared for d/c to CIR by Neuro and NS   Increased cerebral pressure / effacement of 4th ventricle  -hypertonic saline protocol was employed per Neurology with improvement clinically and via CT scan - surgical intervention was not required   Refractory nausea -due to above - counseled family that med tx may offer little relief but will use prn  -continue Thorazine prn -Phenergan found to make patient extremely sleepy and unable to participate during the day with physical therapy - no symptom benefit w/ trial of zofran  Hypertension -avoided overagressive correction/normalization in setting of CVA w/ hydrocephalus - at time of d/c will resume home Azor as careful correction of BP now appropriate   Hyperlipidemia - Transaminitis  -Lipid panel within AHA guidelines -LFTs are currently mildly elevated -  Lipitor 80 mg daily on hold -recheck of LFTs in 48hrs is suggested - if LFTs are still climbing, an initial w/u will be indicated - if they have normalized, consideration should be given to resuming lipitor  Dementia -At baseline - pt alert and conversant   Hypokalemia / Hypomagnesemia -followed and replaced as indicated   Moderate Malnutrition -Dietitian recommends Magic cup TID with  meals; substituted ensure pudding cups TID between meals  -SLP evaluation cleared pt for D2 diet w/ thin  liquids    Medication List    ASK your doctor about these medications        amLODipine-olmesartan 5-40 MG per tablet  Commonly known as:  AZOR  Take 1 tablet by mouth daily.     aspirin EC 81 MG tablet  Take 81 mg by mouth every morning.     atorvastatin 80 MG tablet  Commonly known as:  LIPITOR  Take 80 mg by mouth every morning.     cholecalciferol 1000 UNITS tablet  Commonly known as:  VITAMIN D  Take 1,000 Units by mouth 2 (two) times daily.     multivitamin with minerals Tabs tablet  Take 1 tablet by mouth daily.     omeprazole 40 MG capsule  Commonly known as:  PRILOSEC  Take 40 mg by mouth every morning.     oxyCODONE-acetaminophen 5-325 MG per tablet  Commonly known as:  PERCOCET/ROXICET  Take 1 tablet by mouth every 6 (six) hours as needed for moderate pain or severe pain.     vitamin B-12 1000 MCG tablet  Commonly known as:  CYANOCOBALAMIN  Take 1,000 mcg by mouth daily.        Day of Discharge BP 156/67 mmHg  Pulse 67  Temp(Src) 98.7 F (37.1 C) (Oral)  Resp 19  Ht 5\' 4"  (1.626 m)  Wt 56.2 kg (123 lb 14.4 oz)  BMI 21.26 kg/m2  SpO2 100%  Physical Exam: General: No acute respiratory distress - alert and conversant - looks the best I have seen her since admit  Lungs: Clear to auscultation bilaterally without wheezes or crackles Cardiovascular: Regular rate and rhythm without murmur gallop or rub  Abdomen: Nontender, nondistended, soft, bowel sounds positive, no rebound, no ascites, no appreciable mass Extremities: No significant cyanosis, clubbing, or edema bilateral lower extremities  Basic Metabolic Panel:  Recent Labs Lab 02/05/14 0255  02/06/14 0440  02/07/14 0510  02/08/14 0455 02/08/14 1123 02/08/14 1700 02/08/14 2343 02/09/14 0530  NA 134*  < > 137  < > 144  < > 147* 145 147* 140 140  K 3.2*  --  3.0*  --  3.0*  --  2.9*  --   --   --  3.0*  CL 103  --  103  --  112  --  117*  --   --   --  107  CO2 26  --  24  --  27  --  23   --   --   --  27  GLUCOSE 87  --  97  --  101*  --  116*  --   --   --  116*  BUN 11  --  6  --  6  --  6  --   --   --  6  CREATININE 0.82  --  0.82  --  0.72  --  0.67  --   --   --  0.66  CALCIUM 8.4  --  8.3*  --  8.4  --  8.1*  --   --   --  8.5  MG 1.8  --  1.7  --  1.6  --   --   --   --   --  1.7  < > = values in this interval not displayed.  Liver Function Tests:  Recent Labs Lab 02/03/14 0440 02/05/14  0255 02/06/14 0440 02/07/14 0510  AST 31 28 54* 67*  ALT 20 17 39* 59*  ALKPHOS 120* 101 111 96  BILITOT 1.0 0.7 1.0 0.8  PROT 6.1 5.2* 5.8* 5.2*  ALBUMIN 3.2* 2.8* 3.0* 2.7*   Coags:  Recent Labs Lab 02/05/14 1942  INR 1.03   CBC:  Recent Labs Lab 02/05/14 0255 02/06/14 0440 02/07/14 0510 02/08/14 0455 02/09/14 0530  WBC 5.5 6.0 5.6 8.4 10.1  HGB 11.9* 12.6 11.1* 11.3* 10.8*  HCT 32.8* 35.2* 31.2* 31.2* 30.1*  MCV 89.4 90.0 91.0 92.6 90.4  PLT 213 211 208 203 191    CBG:  Recent Labs Lab 02/07/14 2007 02/07/14 2338  GLUCAP 135* 116*    Recent Results (from the past 240 hour(s))  MRSA PCR Screening     Status: None   Collection Time: 01/31/14  8:04 PM  Result Value Ref Range Status   MRSA by PCR NEGATIVE NEGATIVE Final    Comment:        The GeneXpert MRSA Assay (FDA approved for NASAL specimens only), is one component of a comprehensive MRSA colonization surveillance program. It is not intended to diagnose MRSA infection nor to guide or monitor treatment for MRSA infections.       Time spent in discharge (includes decision making & examination of pt): >30 minutes  02/09/2014, 9:32 AM   Cherene Altes, MD [facility] Office  [phone] Pager [phone]  On-Call/Text Page:      [email or web address]      password [facility]

## 2014-02-09 NOTE — Evaluation (Signed)
Clinical/Bedside Swallow Evaluation Patient Details  Name: [name] MRN: [id number] Date of Birth: 04/18/1933  Today's Date: 02/09/2014 Time: SLP Start Time (ACUTE ONLY): 0820 SLP Stop Time (ACUTE ONLY): 0852 SLP Time Calculation (min) (ACUTE ONLY): 32 min  Past Medical History:  Past Medical History  Diagnosis Date  . Hypertension   . Hyperlipemia   . Dementia   . Cerebral hemorrhage, nontraumatic 2011  . CVA (cerebral infarction) 2011    left cerebellum  . Ureterocele, acquired    Past Surgical History:  Past Surgical History  Procedure Laterality Date  . Abdominal hysterectomy    . Cholecystectomy     HPI:  81 y.o. female with PMH of HTN, HPL, Spinal DJD, compression fracture at T7, CVA, dementia, chronic intermittent dizziness (1-2 years) presented with worsening dizziness, vertigo, difficulty with ambulation. CT confluent left PICA territory with petechial hemorrhage, edema,   Assessment / Plan / Recommendation Clinical Impression  Pt's daughter present reporting frequent coughing with liquids yesterday and prolonged mastication. Oral dysphagia demonstrated during breakfast with regular textures (canteloupe) exhibited by prolonged mastication and transit. No evidence of compromised larygneal protection via cup or straw this assessment. Straws can increase aspiration risk however pt educated to take very small sips (observed taking continuous sips) to allow straw use. Lung sounds clear per RN documentation and afebrile. Recommend downgrade to Dys 2 texture and continue thin with straw small sips    Aspiration Risk  Moderate    Diet Recommendation Dysphagia 2 (Fine chop);Thin liquid   Liquid Administration via: Cup;Straw Medication Administration: Whole meds with puree Supervision: Patient able to self feed;Full supervision/cueing for compensatory strategies Compensations: Slow rate;Small sips/bites Postural Changes and/or Swallow Maneuvers: Seated upright 90 degrees     Other  Recommendations Oral Care Recommendations: Oral care BID   Follow Up Recommendations  Inpatient Rehab    Frequency and Duration min 2x/week  2 weeks   Pertinent Vitals/Pain No pain         Swallow Study           Oral/Motor/Sensory Function Overall Oral Motor/Sensory Function: Appears within functional limits for tasks assessed   Ice Chips Ice chips: Not tested   Thin Liquid Thin Liquid: Within functional limits Presentation: Cup;Straw    Nectar Thick Nectar Thick Liquid: Not tested   Honey Thick Honey Thick Liquid: Not tested   Puree Puree:  (applesauce unavailable, politely refused pudding)   Solid   GO    Solid: Impaired Oral Phase Impairments: Reduced lingual movement/coordination Oral Phase Functional Implications:  (prolonged mastication) Pharyngeal Phase Impairments:  (none)       Jayko Voorhees, Orbie Pyo 02/09/2014,9:12 AM  Orbie Pyo Colvin Caroli.Ed [employer] [phone]

## 2014-02-09 NOTE — Progress Notes (Signed)
STROKE TEAM PROGRESS NOTE   HISTORY [name] is an 81 y.o. female history of hypertension, hyperlipidemia, dementia, nontraumatic cerebral hemorrhage and ureterocele, presenting with new onset vertigo with nausea and vomiting as well as unstable gait. Patient was last known well at 10 PM last night 01/30/2014 (LKW). She noticed symptoms when she attempted to sit on the side of her bed at 4 AM. She's had no change in speech. No focal weakness has been noted. She said no visual changes. She's been taking aspirin daily. CT scan of her head showed nonhemorrhagic infarction involving a large portion of the left cerebral hemisphere inferiorly as well as a small portion of the right cerebellar hemisphere inferomedially. There was slight mass effect on fourth ventricle. NIH stroke score was 0. Patient was not administered TPA secondary to beyond time window for treatment consideration. She was admitted for further evaluation and treatment.   SUBJECTIVE (INTERVAL HISTORY) Daughter is at the bedside.  Her neurologic conditions is stable. Awake alert, answered questions appropriately. she denies headache, nauseous. She was having breakfast this am during rounds. off 3% saline, sodium 140 this morning.  However, on working with PT, she developed orthostatic hypotension and near syncope. She was put back on chair and BP much improved. She stated no complains. PT will admit pt to CIR. Recommend gradual work out in rehab.  OBJECTIVE Temp:  [98.3 F (36.8 C)-99.1 F (37.3 C)] 98.7 F (37.1 C) (02/03 0800) Pulse Rate:  [64-107] 78 (02/03 1030) Cardiac Rhythm:  [-] Normal sinus rhythm (02/02 2000) Resp:  [15-26] 22 (02/03 1030) BP: (82-166)/(49-81) 101/49 mmHg (02/03 1030) SpO2:  [96 %-100 %] 97 % (02/03 1030)   Recent Labs Lab 02/07/14 2007 02/07/14 2338  GLUCAP 135* 116*    Recent Labs Lab 02/05/14 0255  02/06/14 0440  02/07/14 0510  02/08/14 0455 02/08/14 1123 02/08/14 1700 02/08/14 2343  02/09/14 0530  NA 134*  < > 137  < > 144  < > 147* 145 147* 140 140  K 3.2*  --  3.0*  --  3.0*  --  2.9*  --   --   --  3.0*  CL 103  --  103  --  112  --  117*  --   --   --  107  CO2 26  --  24  --  27  --  23  --   --   --  27  GLUCOSE 87  --  97  --  101*  --  116*  --   --   --  116*  BUN 11  --  6  --  6  --  6  --   --   --  6  CREATININE 0.82  --  0.82  --  0.72  --  0.67  --   --   --  0.66  CALCIUM 8.4  --  8.3*  --  8.4  --  8.1*  --   --   --  8.5  MG 1.8  --  1.7  --  1.6  --   --   --   --   --  1.7  < > = values in this interval not displayed.  Recent Labs Lab 02/03/14 0440 02/05/14 0255 02/06/14 0440 02/07/14 0510  AST 31 28 54* 67*  ALT 20 17 39* 59*  ALKPHOS 120* 101 111 96  BILITOT 1.0 0.7 1.0 0.8  PROT 6.1 5.2* 5.8* 5.2*  ALBUMIN  3.2* 2.8* 3.0* 2.7*    Recent Labs Lab 02/05/14 0255 02/06/14 0440 02/07/14 0510 02/08/14 0455 02/09/14 0530  WBC 5.5 6.0 5.6 8.4 10.1  HGB 11.9* 12.6 11.1* 11.3* 10.8*  HCT 32.8* 35.2* 31.2* 31.2* 30.1*  MCV 89.4 90.0 91.0 92.6 90.4  PLT 213 211 208 203 191   No results for input(s): CKTOTAL, CKMB, CKMBINDEX, TROPONINI in the last 168 hours. No results for input(s): LABPROT, INR in the last 72 hours. No results for input(s): COLORURINE, LABSPEC, [place], GLUCOSEU, HGBUR, BILIRUBINUR, KETONESUR, PROTEINUR, UROBILINOGEN, NITRITE, LEUKOCYTESUR in the last 72 hours.  Invalid input(s): APPERANCEUR     Component Value Date/Time   CHOL 126 02/01/2014 0310   TRIG 46 02/01/2014 0310   HDL 46 02/01/2014 0310   CHOLHDL 2.7 02/01/2014 0310   VLDL 9 02/01/2014 0310   LDLCALC 71 02/01/2014 0310   Lab Results  Component Value Date   HGBA1C 5.6 02/01/2014   No results found for: LABOPIA, COCAINSCRNUR, LABBENZ, AMPHETMU, THCU, LABBARB  No results for input(s): ETH in the last 168 hours.  I have personally reviewed the radiological images below and agree with the radiology interpretations.  Ct Angio Head and neck W/cm &/or  Wo Cm  02/03/2014   IMPRESSION: Acute infarct in the left inferior cerebellum in the PICA territory shows progressive edema and swelling with mass effect on the fourth ventricle. No obstructive hydrocephalus or hemorrhage identified. Left PICA is now patent. Both vertebral arteries are widely patent.  Bilateral carotid atherosclerotic disease. Ulcerated plaque involving the right common carotid artery. No significant internal carotid artery stenosis. Moderately severe stenosis proximal right external carotid artery.  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   Electronically Signed   By: Franchot Gallo M.D.   On: 02/03/2014 07:34   Dg Chest 2 View  01/31/2014    IMPRESSION: No acute cardiopulmonary disease. Lower lung volumes than on prior with basilar atelectasis.     Ct Head Wo Contrast  02/07/14 - 1. Confluent left PICA territory with petechial hemorrhage, edema, and posterior fossa mass effect. Stable partially effaced basilar cisterns. 2. Interval mild regression of the acute ventriculomegaly, suggesting partial resolution of the fourth ventricle mass effect. 3. No new intracranial abnormality identified.  02/05/2014   IMPRESSION: Evolving LEFT posterior inferior cerebellar artery territory infarct with worsening edema, progressive fourth ventricle and cerebral aqueduct effacement resulting in early obstructive hydrocephalus.  Worsening sphenoid sinusitis.      02/02/2014    IMPRESSION: Evolving LEFT posterior inferior cerebellar artery territory infarct with worsening edema, partial effacement of the fourth ventricle without obstructive hydrocephalus. No hemorrhagic conversion.      01/31/2014   IMPRESSION: Subacute nonhemorrhagic infarction involving a large portion of the left cerebellar hemisphere inferiorly as well as a small portion of the right cerebellar hemisphere inferomedially. Minimal mass effect on the fourth ventricle.  Moderate atrophy and microvascular disease in  the cerebral hemispheres.  Sphenoid sinus disease bilaterally.     Mri and Mra Brain Wo Contrast  02/01/2014   IMPRESSION: MRI HEAD: Acute LEFT posterior inferior cerebellar artery territory infarct with petechial hemorrhage.  Scattered foci of susceptibility artifact in a pattern suggesting sequelae of chronic hypertension.  Acute sphenoid sinusitis.  MRA HEAD: Thready irregular LEFT posterior-inferior cerebellar artery, this could reflect atherosclerosis or mass effect from acute infarct.  Mid grade stenosis of RIGHT P2 segment with luminal irregularity of the bilateral posterior cerebral arteries consistent with atherosclerosis.     Carotid Doppler  There  is 1-39% bilateral ICA stenosis. Vertebral artery flow is antegrade.    2D echo - Left ventricle: The cavity size was normal. Wall thickness was normal. Systolic function was normal. The estimated ejection fraction was in the range of 55% to 60%. Wall motion was normal; there were no regional wall motion abnormalities. Doppler parameters are consistent with abnormal left ventricular relaxation (grade 1 diastolic dysfunction).  Impressions: - Normal LV function; grade 1 diastolic dysfunction; no significant valvular regurgitation noted.  Component     Latest Ref Rng 02/01/2014  Cholesterol     0 - 200 mg/dL 126  Triglycerides     <150 mg/dL 46  HDL     >39 mg/dL 46  Total CHOL/HDL Ratio      2.7  VLDL     0 - 40 mg/dL 9  LDL (calc)     0 - 99 mg/dL 71  Hgb A1c MFr Bld     <5.7 % 5.6  Mean Plasma Glucose     <117 mg/dL 114    PHYSICAL EXAM  Temp:  [98.3 F (36.8 C)-99.1 F (37.3 C)] 98.7 F (37.1 C) (02/03 0800) Pulse Rate:  [64-107] 78 (02/03 1030) Resp:  [15-26] 22 (02/03 1030) BP: (82-166)/(49-81) 101/49 mmHg (02/03 1030) SpO2:  [96 %-100 %] 97 % (02/03 1030)  General - Well nourished, well developed, not in acute distress.  Ophthalmologic - not cooperate due to photophobia.  Cardiovascular -  Regular rate and rhythm with no murmur.  Mental Status -  Level of arousal and orientation to time, place, and person were intact. Language including expression, naming, repetition, comprehension was assessed and found intact. Following commands.  Cranial Nerves II - XII - II - Visual field intact OU. III, IV, VI - Extraocular movements intact, but with bilateral nystagmus. V - Facial sensation intact bilaterally. VII - Facial movement intact bilaterally. VIII - Hearing & vestibular intact bilaterally, but with bilateral nystagmus. X - Palate elevates symmetrically. XI - Chin turning & shoulder shrug intact bilaterally. XII - Tongue protrusion intact.  Motor Strength - The patient's strength was normal in all extremities and pronator drift was absent.  Bulk was normal and fasciculations were absent.   Motor Tone - Muscle tone was assessed at the neck and appendages and was normal.  Reflexes - The patient's reflexes were 1+ in all extremities and she had no pathological reflexes.  Sensory - Light touch, temperature/pinprick, vibration and proprioception, and Romberg testing were assessed and were normal.    Coordination - The patient had intact FTN bilaterally.  Tremor was absent.  Gait and Station - not tested.   ASSESSMENT/PLAN Ms. [name] is a 81 y.o. female with history of hypertension, hyperlipidemia, dementia, nontraumatic cerebral hemorrhage and ureterocele presenting with new onset nausea and vomiting as well as dizziness and unstable gait. She did not receive IV t-PA due to delay in arrival.   Stroke:  left PICA infarct with petechial hemorrhage c/w chronic hypertension, felt to be embolic (? Afib) vs. Large vessel athero, at risk for neuro worsening given subtentorial location  Resultant  No focal deficit  MRI  L PICA infarct  MRA  Thready L PICA, R P2 high grade stenosis  CTA head and neck showed left VA athero and bilateral ICA athero  Repeat CT showed  slightly decreased hydrocephalus -  taper off 3% saline today  Carotid Doppler  No significant stenosis   2D Echo  unremarkable   HgbA1c 5.6  Lovenox 40 mg  sq daily for VTE prophylaxis  DIET DYS 2 thin liquids  aspirin 81 mg orally every day prior to admission, now on clopidogrel 75 mg orally every day. Continue plavix on discharge.  Ongoing aggressive stroke risk factor management  No further embolic workup as pt not an anticoagulation candidate due to fall risk, dementia  Therapy recommendations:  CIR   Disposition:  CIR.  Hydrocephalus - series CT showed developing hydrocephalus 02/05/14 - Repeat CAT scan today shows slightly decreased hydrocephalus - neuro stable at this time - day 9 today, expected hydrocephalus will improve over time - off 3% saline now.   Orthostatic hypotension  Happened during first PT session today  Likely due to deconditioning  Recommend to work out gradually in rehab  Hypertension  Home meds - amlodipine Blood pressure goal normotensive  BP slightly on the high side.  Hyperlipidemia  Home meds:  lipitor 80 resumed in hospital  LDL 71, goal < 70  Continue statin at discharge  Malnutrition  Encourage increase po intake  Ensure for oral supplements.  Diet regular  Appetite much improved.  Other Stroke Risk Factors  Advanced age  Hx stroke/TIA - hx small L cerebellar infarct 2011; progression L frontal micro hemorrhages since 2009 (? Amyloid)  Family hx stroke (mother)  Other Active Problems  Baseline dementia - ? amyloid  Collapsed vertebrae, requires help with ADLs, recently staying with daughter who helps provide care  Hospital day # 9   Neurology will sign off. Please call with questions. Pt will follow up with Dr. Erlinda Hong at [facility] in about 2 months. Thanks for the consult.  Rosalin Hawking, MD PhD Stroke Neurology 02/09/2014 10:42 AM    To contact Stroke Continuity provider, please refer to [email or web address] After hours,  contact General Neurology

## 2014-02-09 NOTE — Progress Notes (Signed)
Meredith Staggers, MD Physician Signed Physical Medicine and Rehabilitation Consult Note 02/02/2014 1:48 PM  Related encounter: ED to Hosp-Admission (Discharged) from 01/31/2014 in [facility]    Expand All Collapse All        Physical Medicine and Rehabilitation Consult Reason for Consult:left PICA infarct Referring Physician: [facility]   HPI: Dayla Gasca is a 81 y.o.right handed female with history of hypertension, dementia, osteoporosis, nontraumatic cerebral hemorrhage 2011.Marland Kitchen Patient lives with family used a rolling walker prior to admission. Presented 01/31/2014 with nausea vomiting as well as dizziness and unsteady gait. MRI showed acute left posterior inferior cerebellar artery territory infarct. MRA with mid stenosis right P2 segment with luminal irregularity of the bilat posterior cerebral arteries consistent with atherosclerosis. Echocardiogram with ejection fraction 25% grade 1 diastolic dysfunction. Carotid Doppler no ICA stenosis. Patient did not receive TPA. CT angiogram head and neck are pending. Neurology consulted presently on Plavix for CVA prophylaxis with the addition of Lovenox for DVT prophylaxis. Tolerating a regular diet. Physical therapy evaluation completed 02/01/2014 recommendations of physical medicine rehabilitation consult   Review of Systems  Gastrointestinal: Positive for nausea and vomiting.   GERD  Musculoskeletal: Positive for myalgias and falls.  Neurological: Positive for dizziness.  All other systems reviewed and are negative.  Past Medical History  Diagnosis Date  . Hypertension   . Hyperlipemia   . Dementia   . Cerebral hemorrhage, nontraumatic 2011  . CVA (cerebral infarction) 2011    left cerebellum  . Ureterocele, acquired    Past Surgical History  Procedure Laterality Date  . Abdominal hysterectomy    . Cholecystectomy     History reviewed. No pertinent family  history. Social History:  reports that she has quit smoking. She does not have any smokeless tobacco history on file. She reports that she does not drink alcohol or use illicit drugs. Allergies:  Allergies  Allergen Reactions  . Penicillins     Reaction: unknown  . Latex Rash   Medications Prior to Admission  Medication Sig Dispense Refill  . amLODipine-olmesartan (AZOR) 5-40 MG per tablet Take 1 tablet by mouth daily.    Marland Kitchen aspirin EC 81 MG tablet Take 81 mg by mouth every morning.    Marland Kitchen atorvastatin (LIPITOR) 80 MG tablet Take 80 mg by mouth every morning.    . cholecalciferol (VITAMIN D) 1000 UNITS tablet Take 1,000 Units by mouth 2 (two) times daily.    . Multiple Vitamin (MULTIVITAMIN WITH MINERALS) TABS Take 1 tablet by mouth daily.    Marland Kitchen omeprazole (PRILOSEC) 40 MG capsule Take 40 mg by mouth every morning.    Marland Kitchen oxyCODONE-acetaminophen (PERCOCET/ROXICET) 5-325 MG per tablet Take 1 tablet by mouth every 6 (six) hours as needed for moderate pain or severe pain. 60 tablet 0  . vitamin B-12 (CYANOCOBALAMIN) 1000 MCG tablet Take 1,000 mcg by mouth daily.      Home: Home Living Family/patient expects to be discharged to:: Private residence Living Arrangements: Children Available Help at Discharge: Family Type of Home: [place]: Able to live on main level with bedroom/bathroom Home Equipment: Walker - 2 wheels  Functional History: Prior Function Level of Independence: Needs assistance Gait / Transfers Assistance Needed: ambulated with RW, had assist for bed mobility secondary to recent compression fracture in spine Functional Status:  Mobility: Bed Mobility Overal bed mobility: Needs Assistance Bed Mobility: Rolling, Sidelying to Sit Rolling: Min assist Sidelying to sit: Min assist General bed mobility comments:  VCs for technique (re: compression fracture of back, educated on precautions for comfort), Assist to  elevate trunk to upright, increased time to perform, nauseated with positional change Transfers Overall transfer level: Needs assistance Equipment used: 2 person hand held assist Transfers: Sit to/from Stand Sit to Stand: Min assist General transfer comment: Vcs for hand placement, patient able to elevate to standing with minimal assist for stability. continues to endorse nausea Ambulation/Gait Ambulation/Gait assistance: Min assist Ambulation Distance (Feet): 10 Feet Assistive device: 2 person hand held assist Gait Pattern/deviations: Step-to pattern, Decreased stride length, Shuffle, Trunk flexed Gait velocity: decreased Gait velocity interpretation: <1.8 ft/sec, indicative of risk for recurrent falls General Gait Details: limited by nausea, unable to determine normal gait pattern or possible ataxic coordination with gait due to limited overall mobility re: nausea    ADL:    Cognition: Cognition Overall Cognitive Status: History of cognitive impairments - at baseline Orientation Level: Oriented X4 Cognition Arousal/Alertness: Lethargic (from anti nausea medications per family) Behavior During Therapy: Flat affect Overall Cognitive Status: History of cognitive impairments - at baseline  Blood pressure 141/63, pulse 77, temperature 99.1 F (37.3 C), temperature source Oral, resp. rate 14, height 5\' 4"  (1.626 m), weight 56.9 kg (125 lb 7.1 oz), SpO2 99 %. Physical Exam  HENT:  Head: Normocephalic.  Eyes: EOM are normal.  Neck: Normal range of motion. Neck supple. No thyromegaly present.  Cardiovascular: Normal rate and regular rhythm.  Respiratory: Effort normal and breath sounds normal. No respiratory distress.  GI: Soft. Bowel sounds are normal. She exhibits no distension.  Neurological: She is alert.  She provides her name, age and date of birth and follow simple commands. She mostly Her eyes closed during exam due to nausea when she opened her eyes. Moves all 4's. Senses  pain.     Lab Results Last 24 Hours    Results for orders placed or performed during the hospital encounter of 01/31/14 (from the past 24 hour(s))  Comprehensive metabolic panel Status: Abnormal   Collection Time: 02/02/14 3:39 AM  Result Value Ref Range   Sodium 130 (L) 135 - 145 mmol/L   Potassium 3.9 3.5 - 5.1 mmol/L   Chloride 99 96 - 112 mmol/L   CO2 22 19 - 32 mmol/L   Glucose, Bld 109 (H) 70 - 99 mg/dL   BUN 8 6 - 23 mg/dL   Creatinine, Ser 0.93 0.50 - 1.10 mg/dL   Calcium 8.5 8.4 - 10.5 mg/dL   Total Protein 6.0 6.0 - 8.3 g/dL   Albumin 3.3 (L) 3.5 - 5.2 g/dL   AST 26 0 - 37 U/L   ALT 20 0 - 35 U/L   Alkaline Phosphatase 127 (H) 39 - 117 U/L   Total Bilirubin 1.0 0.3 - 1.2 mg/dL   GFR calc non Af Amer 57 (L) >90 mL/min   GFR calc Af Amer 66 (L) >90 mL/min   Anion gap 9 5 - 15  CBC with Differential/Platelet Status: Abnormal   Collection Time: 02/02/14 3:39 AM  Result Value Ref Range   WBC 10.0 4.0 - 10.5 K/uL   RBC 3.82 (L) 3.87 - 5.11 MIL/uL   Hemoglobin 12.2 12.0 - 15.0 g/dL   HCT 34.6 (L) 36.0 - 46.0 %   MCV 90.6 78.0 - 100.0 fL   MCH 31.9 26.0 - 34.0 pg   MCHC 35.3 30.0 - 36.0 g/dL   RDW 11.7 11.5 - 15.5 %   Platelets 234 150 - 400 K/uL  Neutrophils Relative % 86 (H) 43 - 77 %   Neutro Abs 8.6 (H) 1.7 - 7.7 K/uL   Lymphocytes Relative 9 (L) 12 - 46 %   Lymphs Abs 0.9 0.7 - 4.0 K/uL   Monocytes Relative 5 3 - 12 %   Monocytes Absolute 0.5 0.1 - 1.0 K/uL   Eosinophils Relative 0 0 - 5 %   Eosinophils Absolute 0.0 0.0 - 0.7 K/uL   Basophils Relative 0 0 - 1 %   Basophils Absolute 0.0 0.0 - 0.1 K/uL  Magnesium Status: None   Collection Time: 02/02/14 3:39 AM  Result Value Ref Range   Magnesium 1.7 1.5 - 2.5 mg/dL      Imaging Results (Last 48 hours)    Dg Chest 2  View  01/31/2014 CLINICAL DATA: Nausea. Dizziness. Initial encounter. EXAM: CHEST 2 VIEW COMPARISON: 01/25/2010. FINDINGS: Lung volumes are lower than on prior. There is opacity over the lower lobes on the lateral view which is most compatible with atelectasis based on the lung volumes. Pneumonia is considered less likely. In the interval since the prior exam from 2012, mid thoracic compression fracture has developed with about 75% maximal loss of vertebral body height. This appears at about the T7 level IMPRESSION: No acute cardiopulmonary disease. Lower lung volumes than on prior with basilar atelectasis. Electronically Signed By: Dereck Ligas M.D. On: 01/31/2014 16:37   Ct Head Wo Contrast  02/02/2014 CLINICAL DATA: Followup stroke, continued dizziness, nausea and vomiting. EXAM: CT HEAD WITHOUT CONTRAST TECHNIQUE: Contiguous axial images were obtained from the base of the skull through the vertex without intravenous contrast. COMPARISON: MRI of the head February 01, 2014 FINDINGS: Wedge-like hypodensity in LEFT inferior frontal lobe corresponding to known infarct, there is increasing cerebellar edema, LEFT greater than RIGHT with partial effacement of the fourth ventricle. Symmetric densities within the foramen of Luschka, unchanged. Very mild effacement of LEFT quadrigeminal cistern. No intraparenchymal hemorrhage. No hydrocephalus. No supratentorial acute large vascular territory infarct. No abnormal extra-axial fluid collections. Acute Mild sphenoid ethmoidal sinusitis. The mastoid air cells are well aerated. Mild temporomandibular osteoarthrosis. No skull fracture. Ocular globes and orbital contents are unremarkable. IMPRESSION: Evolving LEFT posterior inferior cerebellar artery territory infarct with worsening edema, partial effacement of the fourth ventricle without obstructive hydrocephalus. No hemorrhagic conversion. Electronically Signed By: Elon Alas On:  02/02/2014 05:39   Ct Head Wo Contrast  01/31/2014 CLINICAL DATA: Worsening lightheadedness. EXAM: CT HEAD WITHOUT CONTRAST TECHNIQUE: Contiguous axial images were obtained from the base of the skull through the vertex without intravenous contrast. COMPARISON: 01/16/2014 FINDINGS: There is wedge-shaped low-attenuation involving the inferior left cerebellar hemisphere, new from 01/16/2014. There also is a smaller focus of low attenuation in the medial inferior right cerebellar hemisphere. These posterior fossa abnormalities likely represent subacute nonhemorrhagic infarctions. There is minimal mass effect on the fourth ventricle. No other subacute infarctions are evident. There is moderate generalized atrophy. There is mild to moderate chronic microvascular ischemic change in the deep white matter. There is no intracranial hemorrhage. There is no mass. There is a right sphenoid sinus air-fluid level. There is retained secretion within the left sphenoid sinus. IMPRESSION: Subacute nonhemorrhagic infarction involving a large portion of the left cerebellar hemisphere inferiorly as well as a small portion of the right cerebellar hemisphere inferomedially. Minimal mass effect on the fourth ventricle. Moderate atrophy and microvascular disease in the cerebral hemispheres. Sphenoid sinus disease bilaterally. Electronically Signed By: Andreas Newport M.D. On: 01/31/2014 16:48   Mr Brain Lottie Dawson  Contrast  02/01/2014 CLINICAL DATA: Chronic intermittent dizziness for 1-2 years, acute onset vertigo at 4 a.m. Difficulty angulation with recent spinal fractures. New onset severe vertigo, nausea, ataxia. EXAM: MRI HEAD WITHOUT CONTRAST MRA HEAD WITHOUT CONTRAST TECHNIQUE: Multiplanar, multiecho pulse sequences of the brain and surrounding structures were obtained without intravenous contrast. Angiographic images of the head were obtained using MRA technique without contrast. COMPARISON: CT of the head  January 31, 2014 FINDINGS: MRI HEAD FINDINGS Reduced diffusion and LEFT inferior cerebellum, corresponding low ADC values and mild FLAIR hyperintense signal, slight effacement of the associated cerebellar folia without fourth ventricle effacement. Minimal susceptibility artifact within LEFT inferior cerebellum suggest petechial hemorrhage. Punctate focus of susceptibility artifact in the periphery of the supra and infratentorial brain. Ventricles and sulci are overall normal for patient's age. No midline shift. No abnormal extra-axial fluid collections. Ocular globes and orbital contents are unremarkable. Layering air-fluid levels in the sphenoid sinuses. The mastoid air cells are well aerated. Patient is edentulous. MRA HEAD FINDINGS Anterior circulation: Flow related enhancement of the included cervical, petrous, cavernous and supra clinoid internal carotid arteries. Mild luminal irregularity of the RIGHT greater than LEFT cavernous carotid artery, with associated calcific atherosclerosis on yesterday's CT. Patent anterior communicating artery. Normal flow related enhancement of the anterior and middle cerebral arteries, including more distal segments. Mildly dolichoectatic No large vessel occlusion, high-grade stenosis, aneurysm. Posterior circulation: LEFT vertebral artery is dominant. Thready flow related enhancement of LEFT posterior inferior cerebellar artery. Basilar artery is patent, with normal flow related enhancement of the main branch vessels. Flow related enhancement of the posterior cerebral arteries. Mid grade stenosis of RIGHT P 2 segment, with compensatory diminutive RIGHT P1 segment, robust RIGHT posterior communicating artery. Luminal irregularity of the bilateral posterior cerebral arteries. No large vessel occlusion, high-grade stenosis, aneurysm. IMPRESSION: MRI HEAD: Acute LEFT posterior inferior cerebellar artery territory infarct with petechial hemorrhage. Scattered foci of  susceptibility artifact in a pattern suggesting sequelae of chronic hypertension. Acute sphenoid sinusitis. MRA HEAD: Thready irregular LEFT posterior-inferior cerebellar artery, this could reflect atherosclerosis or mass effect from acute infarct. Mid grade stenosis of RIGHT P2 segment with luminal irregularity of the bilateral posterior cerebral arteries consistent with atherosclerosis. Electronically Signed By: Elon Alas On: 02/01/2014 03:39   Mr Jodene Nam Head/brain Wo Cm  02/01/2014 CLINICAL DATA: Chronic intermittent dizziness for 1-2 years, acute onset vertigo at 4 a.m. Difficulty angulation with recent spinal fractures. New onset severe vertigo, nausea, ataxia. EXAM: MRI HEAD WITHOUT CONTRAST MRA HEAD WITHOUT CONTRAST TECHNIQUE: Multiplanar, multiecho pulse sequences of the brain and surrounding structures were obtained without intravenous contrast. Angiographic images of the head were obtained using MRA technique without contrast. COMPARISON: CT of the head January 31, 2014 FINDINGS: MRI HEAD FINDINGS Reduced diffusion and LEFT inferior cerebellum, corresponding low ADC values and mild FLAIR hyperintense signal, slight effacement of the associated cerebellar folia without fourth ventricle effacement. Minimal susceptibility artifact within LEFT inferior cerebellum suggest petechial hemorrhage. Punctate focus of susceptibility artifact in the periphery of the supra and infratentorial brain. Ventricles and sulci are overall normal for patient's age. No midline shift. No abnormal extra-axial fluid collections. Ocular globes and orbital contents are unremarkable. Layering air-fluid levels in the sphenoid sinuses. The mastoid air cells are well aerated. Patient is edentulous. MRA HEAD FINDINGS Anterior circulation: Flow related enhancement of the included cervical, petrous, cavernous and supra clinoid internal carotid arteries. Mild luminal irregularity of the RIGHT greater than LEFT  cavernous carotid artery, with associated calcific atherosclerosis on yesterday's  CT. Patent anterior communicating artery. Normal flow related enhancement of the anterior and middle cerebral arteries, including more distal segments. Mildly dolichoectatic No large vessel occlusion, high-grade stenosis, aneurysm. Posterior circulation: LEFT vertebral artery is dominant. Thready flow related enhancement of LEFT posterior inferior cerebellar artery. Basilar artery is patent, with normal flow related enhancement of the main branch vessels. Flow related enhancement of the posterior cerebral arteries. Mid grade stenosis of RIGHT P 2 segment, with compensatory diminutive RIGHT P1 segment, robust RIGHT posterior communicating artery. Luminal irregularity of the bilateral posterior cerebral arteries. No large vessel occlusion, high-grade stenosis, aneurysm. IMPRESSION: MRI HEAD: Acute LEFT posterior inferior cerebellar artery territory infarct with petechial hemorrhage. Scattered foci of susceptibility artifact in a pattern suggesting sequelae of chronic hypertension. Acute sphenoid sinusitis. MRA HEAD: Thready irregular LEFT posterior-inferior cerebellar artery, this could reflect atherosclerosis or mass effect from acute infarct. Mid grade stenosis of RIGHT P2 segment with luminal irregularity of the bilateral posterior cerebral arteries consistent with atherosclerosis. Electronically Signed By: Elon Alas On: 02/01/2014 03:39     Assessment/Plan: Diagnosis: left PICA infarct 1. Does the need for close, 24 hr/day medical supervision in concert with the patient's rehab needs make it unreasonable for this patient to be served in a less intensive setting? Yes 2. Co-Morbidities requiring supervision/potential complications: htn, chronic low back pain 3. Due to bladder management, bowel management, safety, skin/wound care, disease management, medication administration, pain management and patient  education, does the patient require 24 hr/day rehab nursing? Yes 4. Does the patient require coordinated care of a physician, rehab nurse, PT (1-2 hrs/day, 5 days/week) and OT (1-2 hrs/day, 5 days/week) to address physical and functional deficits in the context of the above medical diagnosis(es)? Yes Addressing deficits in the following areas: balance, endurance, locomotion, strength, transferring, bowel/bladder control, bathing, dressing, feeding, grooming, toileting and psychosocial support 5. Can the patient actively participate in an intensive therapy program of at least 3 hrs of therapy per day at least 5 days per week? Yes 6. The potential for patient to make measurable gains while on inpatient rehab is good 7. Anticipated functional outcomes upon discharge from inpatient rehab are supervision and min assist with PT, supervision and min assist with OT, n/a with SLP. 8. Estimated rehab length of stay to reach the above functional goals is: 15-20 days 9. Does the patient have adequate social supports and living environment to accommodate these discharge functional goals? Yes 10. Anticipated D/C setting: Home 11. Anticipated post D/C treatments: HH therapy and Outpatient therapy 12. Overall Rehab/Functional Prognosis: good  RECOMMENDATIONS: This patient's condition is appropriate for continued rehabilitative care in the following setting: CIR Patient has agreed to participate in recommended program. Pt was sedated from medication when I was in the room. Very motivated to recover per family Note that insurance prior authorization may be required for reimbursement for recommended care.  Comment: Rehab Admissions Coordinator to follow up.  Thanks,  Meredith Staggers, MD, Mellody Drown     02/02/2014       Revision History     Date/Time User Provider Type Action   02/03/2014 8:34 AM Meredith Staggers, MD Physician Sign   02/02/2014 2:08 PM Cathlyn Parsons, PA-C Physician Assistant Pend     View Details Report       Routing History     Date/Time From To Method   02/03/2014 8:34 AM Meredith Staggers, MD Meredith Staggers, MD In Basket   02/03/2014 8:34 AM Meredith Staggers, MD [name]  Maceo Pro, MD Fax

## 2014-02-09 NOTE — Progress Notes (Signed)
Physical Therapy Treatment Patient Details Name: [name] MRN: [id number] DOB: 25-Mar-1933 Today's Date: 02/09/2014    History of Present Illness 81 y.o. WF PMHx hypertension, hyperlipidemia, dementia, nontraumatic cerebral hemorrhage and ureterocele. Presented with new onset vertigo with nausea and vomiting as well as unstable gait. 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    PT Comments    Pt admitted with above diagnosis. Pt currently with functional limitations due to balance and endurance deficits.  Pt very limited today as she was orthostatic and sick with vomiting during session.  Spoke with Linna Hoff, PA from Rehab who called Dr. Thereasa Solo who is going to look at pts meds and change some of them.  This PT had recommended Meclizine and Dr. Thereasa Solo agrees.    Pt will benefit from skilled PT to increase their independence and safety with mobility to allow discharge to the venue listed below.    Follow Up Recommendations  CIR     Equipment Recommendations   (TBD)    Recommendations for Other Services Rehab consult     Precautions / Restrictions Precautions Precautions: Fall Restrictions Weight Bearing Restrictions: No    Mobility  Bed Mobility Overal bed mobility: Needs Assistance Bed Mobility: Supine to Sit;Sit to Supine Rolling: Mod assist Sidelying to sit: +2 for physical assistance;HOB elevated;Max assist       General bed mobility comments: verbal cues for technique, assist to elevate trunk to upright, incr time to perform, nauseated with positional change.  Transfers Overall transfer level: Needs assistance Equipment used: 2 person hand held assist Transfers: Sit to/from Stand Sit to Stand: Max assist;+2 physical assistance;From elevated surface         General transfer comment: cues for UE use, movement through pivot, max cueing for gaze stabilization.  pt notes less dizziness with coming to stand than wen came to  sitting position.  Facilitation for upright posture and maintaining center of balance.    Ambulation/Gait Ambulation/Gait assistance: Mod assist;Max assist;+2 physical assistance Ambulation Distance (Feet): 6 Feet Assistive device: 2 person hand held assist Gait Pattern/deviations: Step-to pattern;Decreased stride length;Shuffle;Trunk flexed Gait velocity: decreased Gait velocity interpretation: Below normal speed for age/gender General Gait Details: limited by nausea, unable to determine normal gait pattern or possible ataxic coordination with gait due to limited overall mobility re: nausea.  Significant lean to right.  Target "A" did help pt with orientation but pt needed constant cues to keep eyes on target "A".  Pt was letting her body get ahead of her feet as well with very poor balance.  Was unable to progress ambulation due to pt with poor postural stablity flexing more and more as she stood  and nauseated and requested to sit down.   called for nausea med and encouraged  pt to keep nausea med in her system so she can work with therapy.  This PT reiterated to daughter the importance of pt sitting up in chair for at least an hour 2 x day if possible.  Positioned pt comfortably in chair upon departure. Once pt in chair, called nurse to bring nausea med as pt said she was going to vomit.  Pt did vomit in basin and noted that pt looked like she was becoming lethargic.  Raised feet in recliner and check BP as pt was orthostatic.  MD made aware and to change some of pts medications.     Stairs            Emergency planning/management officer  Modified Rankin (Stroke Patients Only) Modified Rankin (Stroke Patients Only) Pre-Morbid Rankin Score: No significant disability Modified Rankin: Severe disability     Balance Overall balance assessment: Needs assistance;History of Falls Sitting-balance support: Bilateral upper extremity supported;Feet supported Sitting balance-Leahy Scale: Poor Sitting balance  - Comments: pt with R lateral lean and needs kinesthetic input of being allowed to tip to R side for pt to realize she's leaning.  Max cueing for use of "A" as target for gaze stabilization.  In sitting R Nystagmus noted.   Postural control: Right lateral lean;Posterior lean Standing balance support: Bilateral upper extremity supported;During functional activity Standing balance-Leahy Scale: Poor Standing balance comment: Pt needs frequent cuing to use "A".  Needed constant facilitation ffor upright posture as pt flexed.                      Cognition Arousal/Alertness: Awake/alert Behavior During Therapy: WFL for tasks assessed/performed Overall Cognitive Status: History of cognitive impairments - at baseline                      Exercises      General Comments General comments (skin integrity, edema, etc.): Pt with very little tolerance at times for activity due to nausea.  Spoke with doctors who feel that possibly changing meds will help.  Rehab is going to take pt.        Pertinent Vitals/Pain Pain Assessment: No/denies pain    Orthostatic BPs  Supine 151/50, 89 bpm  Sitting 97/47, 108 bpm  Standing 82/64, 119 bpm  Standing after 3 min Would not register  HR 88-118 bpm 97% O2 on RA MD aware of orthostasis.    Home Living                      Prior Function            PT Goals (current goals can now be found in the care plan section) Progress towards PT goals: Progressing toward goals    Frequency  Min 3X/week    PT Plan Current plan remains appropriate    Co-evaluation             End of Session Equipment Utilized During Treatment: Gait belt Activity Tolerance: Treatment limited secondary to medical complications (Comment) (Limited by nausea) Patient left: in chair;with call bell/phone within reach;with family/visitor present     Time: 0920-1006 PT Time Calculation (min) (ACUTE ONLY): 46 min  Charges:  $Gait  Training: 8-22 mins $Therapeutic Activity: 23-37 mins                    G CodesIrwin Brakeman F Feb 17, 2014, 10:53 AM [facility] [phone] (pager)

## 2014-02-09 NOTE — H&P (Signed)
Physical Medicine and Rehabilitation Admission H&P   Chief Complaint  Patient presents with  . Dizziness  : HPI: [name] is a 81 y.o.right handed female with history of hypertension, dementia, osteoporosis, nontraumatic cerebral hemorrhage 2011.Marland Kitchen Patient lives with family used a rolling walker prior to admission. Presented 01/31/2014 with nausea vomiting as well as dizziness and unsteady gait. MRI showed acute left posterior inferior cerebellar artery territory infarct with petechial hemorrhage. MRA with mid stenosis right P2 segment with luminal irregularity of the bilat posterior cerebral arteries consistent with atherosclerosis. Echocardiogram with ejection fraction 39% grade 1 diastolic dysfunction. Carotid Doppler no ICA stenosis. Patient did not receive TPA. CT angiogram head and neck 02/03/2014 shows acute infarct left inferior cerebellum in PICA territory with mass effect on the fourth ventricle. No obstructive hydrocephalus or hemorrhage. Bilateral carotid atherosclerotic disease noted. Moderately severe stenosis proximal right external carotid artery. 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 Follow up CT Head 02/07/2014 with stable appearance, ventriculomegaly slightly improved. Neurology consulted presently on Plavix for CVA prophylaxis with the addition of Lovenox for DVT prophylaxis. Bouts of hypokalemia resolved with potassium supplement. Intermittent bouts of refractory nausea/dizziness and no response with Thorazine or Phenergan. Latest CT abdomen and pelvis unremarkable. Tolerating a dysphagia 2 thin liquid diet. Physical and occupational therapy evaluations completed 02/01/2014 recommendations of physical medicine rehabilitation consult. Patient was admitted for comprehensive rehabilitation program  ROS Review of Systems  Gastrointestinal:  Positive for nausea and vomiting.   GERD  Musculoskeletal: Positive for myalgias and falls.  Neurological: Positive for dizziness.  All other systems reviewed and are negative    Past Medical History  Diagnosis Date  . Hypertension   . Hyperlipemia   . Dementia   . Cerebral hemorrhage, nontraumatic 2011  . CVA (cerebral infarction) 2011    left cerebellum  . Ureterocele, acquired    Past Surgical History  Procedure Laterality Date  . Abdominal hysterectomy    . Cholecystectomy     History reviewed. No pertinent family history. Social History:  reports that she has quit smoking. She does not have any smokeless tobacco history on file. She reports that she does not drink alcohol or use illicit drugs. Allergies:  Allergies  Allergen Reactions  . Penicillins     Reaction: unknown  . Latex Rash   Medications Prior to Admission  Medication Sig Dispense Refill  . amLODipine-olmesartan (AZOR) 5-40 MG per tablet Take 1 tablet by mouth daily.    Marland Kitchen aspirin EC 81 MG tablet Take 81 mg by mouth every morning.    Marland Kitchen atorvastatin (LIPITOR) 80 MG tablet Take 80 mg by mouth every morning.    . cholecalciferol (VITAMIN D) 1000 UNITS tablet Take 1,000 Units by mouth 2 (two) times daily.    . Multiple Vitamin (MULTIVITAMIN WITH MINERALS) TABS Take 1 tablet by mouth daily.    Marland Kitchen omeprazole (PRILOSEC) 40 MG capsule Take 40 mg by mouth every morning.    Marland Kitchen oxyCODONE-acetaminophen (PERCOCET/ROXICET) 5-325 MG per tablet Take 1 tablet by mouth every 6 (six) hours as needed for moderate pain or severe pain. 60 tablet 0  . vitamin B-12 (CYANOCOBALAMIN) 1000 MCG tablet Take 1,000 mcg by mouth daily.      Home: Home Living Family/patient expects to be discharged to:: Inpatient rehab Living Arrangements: Children Available Help at Discharge: Family Type of Home: House Home Layout: Able to  live on  main level with bedroom/bathroom Home Equipment: Walker - 2 wheels  Functional History: Prior Function Level of Independence: Needs assistance Gait / Transfers Assistance Needed: ambulated with RW, had assist for bed mobility secondary to recent compression fracture in spine ADL's / Homemaking Assistance Needed: family reports they were assisting with ADLs since her compression fracture  Functional Status:  Mobility: Bed Mobility Overal bed mobility: Needs Assistance Bed Mobility: Rolling, Sidelying to Sit, Sit to Sidelying Rolling: Min assist Sidelying to sit: Min assist Sit to sidelying: Mod assist General bed mobility comments: VCs for technique (re: compression fracture of back, educated on precautions for comfort), Assist to elevate trunk to upright, increased time to perform, nauseated with positional change Transfers Overall transfer level: Needs assistance Equipment used: 2 person hand held assist Transfers: Sit to/from Stand, Stand Pivot Transfers Sit to Stand: Mod assist Stand pivot transfers: Mod assist General transfer comment: Verbal and tactile cues for hand placement. Gait belt assisted face-to-face transfer. Pt able to assist to push up to rise, then held onto OT's shoulders with UEs. Pt able to take small pivotal steps to [facility] and back. Continues to c/o nausea upon sitting upright. Pt returned to bed and positioned for comfort.  Ambulation/Gait Ambulation/Gait assistance: Mod assist, +2 physical assistance Ambulation Distance (Feet): 6 Feet Assistive device: 2 person hand held assist Gait Pattern/deviations: Step-to pattern, Decreased stride length, Shuffle, Trunk flexed, Narrow base of support, Ataxic Gait velocity: decreased Gait velocity interpretation: Below normal speed for age/gender General Gait Details: limited by nausea, unable to determine normal gait pattern or possible ataxic coordination with gait due to limited overall mobility re: nausea. Significant  lean to right. Target "A" did help pt with orientation but pt needed constant cues to keep eyes on target "A".     ADL: ADL Overall ADL's : Needs assistance/impaired Eating/Feeding: Sitting, Minimal assistance Eating/Feeding Details (indicate cue type and reason): pt's daughter reports she has not been eating much due to nausea Grooming: Minimal assistance, Sitting Upper Body Bathing: Minimal assitance, Sitting Lower Body Bathing: Maximal assistance, Sit to/from stand Upper Body Dressing : Moderate assistance, Sitting Lower Body Dressing: Total assistance, +2 for physical assistance, Sit to/from stand Toilet Transfer: Moderate assistance, Stand-pivot (gait belt assisted face-to-face transfer) Toileting- Clothing Manipulation and Hygiene: Set up, Sitting/lateral lean Toileting - Clothing Manipulation Details (indicate cue type and reason): pt was able to use wet washcloth to perform hygiene while sitting on BSC with lateral leans General ADL Comments: Pt was limited by lethargy this afternoon, however was motivated to get to [facility] for toileting after sitting EOB. Pt continues to c/o nausea but reports she is not currently dizzy.   Cognition: Cognition Overall Cognitive Status: History of cognitive impairments - at baseline Orientation Level: Oriented X4 Cognition Arousal/Alertness: Lethargic Behavior During Therapy: Flat affect Overall Cognitive Status: History of cognitive impairments - at baseline  Physical Exam: Blood pressure 159/67, pulse 80, temperature 98.4 F (36.9 C), temperature source Oral, resp. rate 20, height _0  (1.626 m), weight 60.9 kg (134 lb 4.2 oz), SpO2 99 %. Physical Exam Physical Exam  HENT:  Head: Normocephalic.  Eyes: EOM are normal.  Neck: Normal range of motion. Neck supple. No thyromegaly present.  Cardiovascular: Normal rate and regular rhythm.  Respiratory: Effort normal and breath sounds normal. No respiratory distress.  GI: Soft. Bowel  sounds are normal. She exhibits no distension.  Neurological: She is alert.  She provides her name, age and date of birth and follow simple  commands. She mostly Her eyes closed during exam due to nausea when she opened her eyes. Moves all 4's. Senses pain    Lab Results Last 48 Hours    Results for orders placed or performed during the hospital encounter of 01/31/14 (from the past 48 hour(s))  Comprehensive metabolic panel Status: Abnormal   Collection Time: 02/03/14 4:40 AM  Result Value Ref Range   Sodium 132 (L) 135 - 145 mmol/L   Potassium 3.7 3.5 - 5.1 mmol/L   Chloride 98 96 - 112 mmol/L   CO2 26 19 - 32 mmol/L   Glucose, Bld 93 70 - 99 mg/dL   BUN 11 6 - 23 mg/dL   Creatinine, Ser 0.84 0.50 - 1.10 mg/dL   Calcium 8.6 8.4 - 10.5 mg/dL   Total Protein 6.1 6.0 - 8.3 g/dL   Albumin 3.2 (L) 3.5 - 5.2 g/dL   AST 31 0 - 37 U/L   ALT 20 0 - 35 U/L   Alkaline Phosphatase 120 (H) 39 - 117 U/L   Total Bilirubin 1.0 0.3 - 1.2 mg/dL   GFR calc non Af Amer 64 (L) >90 mL/min   GFR calc Af Amer 75 (L) >90 mL/min    Comment: (NOTE) The eGFR has been calculated using the CKD EPI equation. This calculation has not been validated in all clinical situations. eGFR's persistently <90 mL/min signify possible Chronic Kidney Disease.    Anion gap 8 5 - 15      Imaging Results (Last 48 hours)    Ct Angio Head W/cm &/or Wo Cm  02/03/2014 CLINICAL DATA: Cerebellar stroke. Hypertension and hyperlipidemia EXAM: CT ANGIOGRAPHY HEAD AND NECK TECHNIQUE: Multidetector CT imaging of the head and neck was performed using the standard protocol during bolus administration of intravenous contrast. Multiplanar CT image reconstructions and MIPs were obtained to evaluate the vascular anatomy. Carotid stenosis measurements (when applicable) are obtained utilizing NASCET criteria, using the distal internal carotid diameter as  the denominator. CONTRAST: 55m OMNIPAQUE IOHEXOL 350 MG/ML SOLN COMPARISON: CT head 02/02/2014 FINDINGS: CT HEAD Brain: Evolving acute infarct in the left inferior cerebellum with progressive low-density and mass-effect on the fourth ventricle. No associated hemorrhage. No obstructive hydrocephalus. The infarct does not enhance following contrast administration. No other acute infarct. There is atrophy and mild chronic microvascular ischemia. Calvarium and skull base: Negative Paranasal sinuses: Air-fluid levels in the sphenoid sinus. Orbits: Negative CTA NECK Aortic arch: Mild atherosclerotic disease in the aortic arch. Proximal great vessels widely patent. Negative for mass or adenopathy in the neck. Right carotid system: Mild atherosclerotic disease in the the common carotid artery. Ulcerated plaque in the distal common carotid artery. 2 mm ulcer is present in a plaque projecting anteriorly. Plaque is not causing significant stenosis. Mild atherosclerotic plaque at the bifurcation without significant internal carotid artery stenosis. There is moderately severe stenosis of the origin of the external carotid artery. Left carotid system: Mild atherosclerotic plaque involving the common carotid artery. Mild atherosclerotic disease in the carotid bifurcation without significant stenosis of the internal or external carotid artery on the left. Negative for dissection. Vertebral arteries:Left vertebral dominant. Mild scattered atherosclerotic disease in both vertebral arteries without significant stenosis or dissection. Both vertebral arteries are patent to the basilar. Skeleton: Negative for acute abnormality. Mild degenerative change in the cervical spine. Other neck: Negative for mass in the neck. CTA HEAD Anterior circulation: Extensive atherosclerotic calcification in the cavernous carotid bilaterally with mild carotid stenosis bilaterally. 2.3 mm aneurysm of the left  posterior communicating artery  region. This projects inferiorly from the supra clinoid internal carotid artery. No other aneurysm. The anterior and middle cerebral arteries are patent bilaterally without significant stenosis. Posterior circulation: Both vertebral arteries are patent to the basilar. Mild atherosclerotic calcification at the C1 level on the left. PICA is patent bilaterally. Acute left PICA infarct noted. PICA apparently has recanalized. The basilar is widely patent. AICA, superior cerebellar and posterior cerebral arteries are patent bilaterally. Fetal origin of the right posterior cerebral artery with hypoplastic right P1 segment. Mild stenosis of the proximal posterior cerebral arteries bilaterally. Negative for aneurysm in the posterior circulation. Venous sinuses: Dural sinuses are patent without thrombosis or occlusion. Anatomic variants: None Delayed phase: No enhancing mass lesion IMPRESSION: Acute infarct in the left inferior cerebellum in the PICA territory shows progressive edema and swelling with mass effect on the fourth ventricle. No obstructive hydrocephalus or hemorrhage identified. Left PICA is now patent. Both vertebral arteries are widely patent. Bilateral carotid atherosclerotic disease. Ulcerated plaque involving the right common carotid artery. No significant internal carotid artery stenosis. Moderately severe stenosis proximal right external carotid artery. 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 Electronically Signed By: Franchot Gallo M.D. On: 02/03/2014 07:34   Ct Angio Neck W/cm &/or Wo/cm  02/03/2014 CLINICAL DATA: Cerebellar stroke. Hypertension and hyperlipidemia EXAM: CT ANGIOGRAPHY HEAD AND NECK TECHNIQUE: Multidetector CT imaging of the head and neck was performed using the standard protocol during bolus administration of intravenous contrast. Multiplanar CT image reconstructions and MIPs were obtained to evaluate the vascular anatomy. Carotid stenosis  measurements (when applicable) are obtained utilizing NASCET criteria, using the distal internal carotid diameter as the denominator. CONTRAST: 64m OMNIPAQUE IOHEXOL 350 MG/ML SOLN COMPARISON: CT head 02/02/2014 FINDINGS: CT HEAD Brain: Evolving acute infarct in the left inferior cerebellum with progressive low-density and mass-effect on the fourth ventricle. No associated hemorrhage. No obstructive hydrocephalus. The infarct does not enhance following contrast administration. No other acute infarct. There is atrophy and mild chronic microvascular ischemia. Calvarium and skull base: Negative Paranasal sinuses: Air-fluid levels in the sphenoid sinus. Orbits: Negative CTA NECK Aortic arch: Mild atherosclerotic disease in the aortic arch. Proximal great vessels widely patent. Negative for mass or adenopathy in the neck. Right carotid system: Mild atherosclerotic disease in the the common carotid artery. Ulcerated plaque in the distal common carotid artery. 2 mm ulcer is present in a plaque projecting anteriorly. Plaque is not causing significant stenosis. Mild atherosclerotic plaque at the bifurcation without significant internal carotid artery stenosis. There is moderately severe stenosis of the origin of the external carotid artery. Left carotid system: Mild atherosclerotic plaque involving the common carotid artery. Mild atherosclerotic disease in the carotid bifurcation without significant stenosis of the internal or external carotid artery on the left. Negative for dissection. Vertebral arteries:Left vertebral dominant. Mild scattered atherosclerotic disease in both vertebral arteries without significant stenosis or dissection. Both vertebral arteries are patent to the basilar. Skeleton: Negative for acute abnormality. Mild degenerative change in the cervical spine. Other neck: Negative for mass in the neck. CTA HEAD Anterior circulation: Extensive atherosclerotic calcification in the cavernous  carotid bilaterally with mild carotid stenosis bilaterally. 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 This projects inferiorly from the supra clinoid internal carotid artery. No other aneurysm. The anterior and middle cerebral arteries are patent bilaterally without significant stenosis. Posterior circulation: Both vertebral arteries are patent to the basilar. Mild atherosclerotic calcification at the C1 level on the left. PICA is patent  bilaterally. Acute left PICA infarct noted. PICA apparently has recanalized. The basilar is widely patent. AICA, superior cerebellar and posterior cerebral arteries are patent bilaterally. Fetal origin of the right posterior cerebral artery with hypoplastic right P1 segment. Mild stenosis of the proximal posterior cerebral arteries bilaterally. Negative for aneurysm in the posterior circulation. Venous sinuses: Dural sinuses are patent without thrombosis or occlusion. Anatomic variants: None Delayed phase: No enhancing mass lesion IMPRESSION: Acute infarct in the left inferior cerebellum in the PICA territory shows progressive edema and swelling with mass effect on the fourth ventricle. No obstructive hydrocephalus or hemorrhage identified. Left PICA is now patent. Both vertebral arteries are widely patent. Bilateral carotid atherosclerotic disease. Ulcerated plaque involving the right common carotid artery. No significant internal carotid artery stenosis. Moderately severe stenosis proximal right external carotid artery. 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 Electronically Signed By: Franchot Gallo M.D. On: 02/03/2014 07:34        Medical Problem List and Plan: 1. 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 DVT Prophylaxis/Anticoagulation: Subcutaneous Lovenox. Monitor platelet counts in any signs of bleeding 3. Pain Management/back pain due to osteoporosis: Percocet 1 tablet  every 6 hours as needed as prior to admission. Monitor mental status 4. Mood/dementia: Discussed baseline cognition with family 5. Neuropsych: This patient is capable of making decisions on her own behalf. 6. Skin/Wound Care: Routine skin checks 7. Fluids/Electrolytes/Nutrition/hypokalemia: Strict I know follow-up chemistries 8. Hypertension. Norvasc 5 mg daily, Avapro 300 mg daily.Monitor with increased mobility 9. Hyperlipidemia. Lipitor 10. Refractory nausea/dizziness. PRN antivert, scheduled scopolamine patch. Vestibular evaluation to be completed 11. Dysphagia. Dysphagia 2 thin liquid diet. Follow speech therapy  Post Admission Physician Evaluation: 1. Functional deficits secondary to left PICA infarct with petechial hemorrhage. 2. Patient is admitted to receive collaborative, interdisciplinary care between the physiatrist, rehab nursing staff, and therapy team. 3. Patient's level of medical complexity and substantial therapy needs in context of that medical necessity cannot be provided at a lesser intensity of care such as a SNF. 4. Patient has experienced substantial functional loss from his/her baseline which was documented above under the "Functional History" and "Functional Status" headings. Judging by the patient's diagnosis, physical exam, and functional history, the patient has potential for functional progress which will result in measurable gains while on inpatient rehab. These gains will be of substantial and practical use upon discharge in facilitating mobility and self-care at the household level. 5. Physiatrist will provide 24 hour management of medical needs as well as oversight of the therapy plan/treatment and provide guidance as appropriate regarding the interaction of the two. 6. 24 hour rehab nursing will assist with bladder management, bowel management, safety, skin/wound care, disease management, medication administration, pain management and patient education and help  integrate therapy concepts, techniques,education, etc. 7. PT will assess and treat for/with: Lower extremity strength, range of motion, stamina, balance, functional mobility, safety, adaptive techniques and equipment, NMR, vestibular sx mgt, pain control, ego-support. Goals are: mod I to supervision. 8. OT will assess and treat for/with: ADL's, functional mobility, safety, upper extremity strength, adaptive techniques and equipment, NMR, vestibular sx mgt, community reintegration, ego-support. Goals are: supervision to mod I. Therapy may proceed with showering this patient. 9. SLP will assess and treat for/with: swallowing. Goals are: mod I with regular diet . 10. Case Management and Social Worker will assess and treat for psychological issues and discharge planning. 11. Team conference will be held weekly to assess progress toward goals and to  determine barriers to discharge. 12. Patient will receive at least 3 hours of therapy per day at least 5 days per week. 13. ELOS: 12-16 days  14. Prognosis: excellent     Meredith Staggers, MD, [facility] 02/09/2014

## 2014-02-09 NOTE — Progress Notes (Signed)
Retta Diones, RN Rehab Admission Coordinator Signed Physical Medicine and Rehabilitation PMR Pre-admission 02/09/2014 10:44 AM  Related encounter: ED to Hosp-Admission (Discharged) from 01/31/2014 in [facility]    Expand All Collapse All   PMR Admission Coordinator Pre-Admission Assessment  Patient: [name] is an 81 y.o., female MRN: [id number] DOB: August 12, 1933 Height: 5' 4"  (162.6 cm) Weight: 56.2 kg (123 lb 14.4 oz)  Insurance Information HMO: Yes PPO: PCP: IPA: 80/20: OTHER: Group #[id number] PRIMARY: Blue Medicare Policy#: [id number] Subscriber: [name] Name: Josephina Gip Phone#: [phone] Fax#: [phone] Pre-Cert#: Employer: Retired Benefits: Phone #: [phone] Name: Automated Eff. Date: )01/07/14 Deduct: $0 Out of Pocket Max: $3950 ( met $40) Life Max: unlimited CIR: SNF: $75 days 1-40; $100 days 41-60; $60 days 61-100 Outpatient: medical necessity Co-Pay: $40/visit Home Health: 100% Co-Pay: none DME: 80% Co-Pay: 20% Providers: in network  Emergency Contact Information Contact Information    Name Relation Home Work Mobile   Dawson Daughter [phone]  [phone]   Bolling,David Relative   [phone]     Current Medical History  Patient Admitting Diagnosis: L PICA infarct  History of Present Illness: A 81 y.o.right handed female with history of hypertension, dementia, osteoporosis, nontraumatic cerebral hemorrhage 2011.Marland Kitchen Patient lives with family used a rolling walker prior to admission. Presented 01/31/2014 with nausea vomiting as well as dizziness and unsteady gait. MRI showed acute left posterior inferior cerebellar artery  territory infarct with petechial hemorrhage. MRA with mid stenosis right P2 segment with luminal irregularity of the bilat posterior cerebral arteries consistent with atherosclerosis. Echocardiogram with ejection fraction 89% grade 1 diastolic dysfunction. Carotid Doppler no ICA stenosis. Patient did not receive TPA. CT angiogram head and neck 02/03/2014 shows acute infarct left inferior cerebellum in PICA territory with mass effect on the fourth ventricle. No obstructive hydrocephalus or hemorrhage. Bilateral carotid atherosclerotic disease noted. Moderately severe stenosis proximal right external carotid artery. 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 Follow up CT Head 02/07/2014 with stable appearance, ventriculomegaly slightly improved. Neurology consulted presently on Plavix for CVA prophylaxis with the addition of Lovenox for DVT prophylaxis. Bouts of hypokalemia resolved with potassium supplement. Intermittent bouts of refractory nausea/dizziness and no response with Thorazine or Phenergan. Latest CT abdomen and pelvis unremarkable. Tolerating a dysphagia 2 thin liquid diet. Physical and occupational therapy evaluations completed 02/01/2014 recommendations of physical medicine rehabilitation consult. Patient to be admitted for comprehensive inpatient rehabilitation program.  Note: Patient got up with PT today and had an episode of nausea, vomiting, drop in BP. She has been seen by attending and neurology and has been cleared for rehab today. We will need to progress slowly with therapies as patient tolerance increases each day.  Total: 0=NIH  Past Medical History  Past Medical History  Diagnosis Date  . Hypertension   . Hyperlipemia   . Dementia   . Cerebral hemorrhage, nontraumatic 2011  . CVA (cerebral infarction) 2011    left cerebellum   . Ureterocele, acquired     Family History  family history is not on file.  Prior Rehab/Hospitalizations: No previous rehab admissions.  Current Medications   Current facility-administered medications:  . acetaminophen (TYLENOL) tablet 650 mg, 650 mg, Oral, Q6H PRN, Rhetta Mura Schorr, NP, 650 mg at 02/04/14 1552 . amLODipine (NORVASC) tablet 5 mg, 5 mg, Oral, Daily, 5 mg at 02/09/14 1015 **AND** irbesartan (  AVAPRO) tablet 300 mg, 300 mg, Oral, Daily, Cherene Altes, MD, 300 mg at 02/09/14 1015 . bisacodyl (DULCOLAX) suppository 10 mg, 10 mg, Rectal, Daily PRN, Donzetta Starch, NP, 10 mg at 02/02/14 1031 . chlorproMAZINE (THORAZINE) 12.5 mg in sodium chloride 0.9 % 25 mL IVPB, 12.5 mg, Intravenous, Q6H PRN, Cherene Altes, MD . cholecalciferol (VITAMIN D) tablet 1,000 Units, 1,000 Units, Oral, BID, Kinnie Feil, MD, 1,000 Units at 02/07/14 2124 . clopidogrel (PLAVIX) tablet 75 mg, 75 mg, Oral, Daily, Donzetta Starch, NP, 75 mg at 02/08/14 0960 . enoxaparin (LOVENOX) injection 40 mg, 40 mg, Subcutaneous, Q24H, Kinnie Feil, MD, 40 mg at 02/08/14 2259 . feeding supplement (RESOURCE BREEZE) (RESOURCE BREEZE) liquid 1 Container, 1 Container, Oral, TID BM, Heather Cornelison Pitts, RD, 1 Container at 02/08/14 2013 . multivitamin with minerals tablet 1 tablet, 1 tablet, Oral, Daily, Kinnie Feil, MD, 1 tablet at 02/06/14 1033 . ondansetron (ZOFRAN) injection 4 mg, 4 mg, Intravenous, Q4H PRN, Cherene Altes, MD, 4 mg at 02/09/14 0951 . oxyCODONE-acetaminophen (PERCOCET/ROXICET) 5-325 MG per tablet 1 tablet, 1 tablet, Oral, Q6H PRN, Kinnie Feil, MD . pantoprazole (PROTONIX) EC tablet 40 mg, 40 mg, Oral, Daily, Kinnie Feil, MD, 40 mg at 02/08/14 0938 . potassium chloride SA (K-DUR,KLOR-CON) CR tablet 40 mEq, 40 mEq, Oral, Once, Cherene Altes, MD . senna-docusate (Senokot-S) tablet 1 tablet, 1 tablet, Oral, QHS PRN, Kinnie Feil, MD, 0 tablet  at 02/04/14 2153 . senna-docusate (Senokot-S) tablet 1 tablet, 1 tablet, Oral, BID, Cherene Altes, MD, 1 tablet at 02/07/14 2125 . vitamin B-12 (CYANOCOBALAMIN) tablet 1,000 mcg, 1,000 mcg, Oral, Daily, Kinnie Feil, MD, 1,000 mcg at 02/06/14 1031  Patients Current Diet: DIET DYS 2  Precautions / Restrictions Precautions Precautions: Fall Restrictions Weight Bearing Restrictions: No   Prior Activity Level Household: Was homebound PTA getting out about 1 X a week for groceries with her daughter.   Home Assistive Devices / Equipment Home Assistive Devices/Equipment: Wheelchair, Environmental consultant (specify type) Home Equipment: Walker - 2 wheels  Prior Functional Level Prior Function Level of Independence: Needs assistance Gait / Transfers Assistance Needed: ambulated with RW, had assist for bed mobility secondary to recent compression fracture in spine ADL's / Homemaking Assistance Needed: family reports they were assisting with ADLs since her compression fracture. She had been staying with her daughter for two weeks using a wheel chair out of the house and a walker in daughter's home. Husband has been staying with their son.  Current Functional Level Cognition  Overall Cognitive Status: History of cognitive impairments - at baseline Orientation Level: Oriented X4   Extremity Assessment (includes Sensation/Coordination)  Upper Extremity Assessment: Generalized weakness  Lower Extremity Assessment: Generalized weakness    ADLs  Overall ADL's : Needs assistance/impaired Eating/Feeding: Sitting, Minimal assistance Eating/Feeding Details (indicate cue type and reason): pt's daughter reports she has not been eating much due to nausea Grooming: Wash/dry face, Brushing hair, Set up, Supervision/safety, Sitting Grooming Details (indicate cue type and reason): in recliner Upper Body Bathing: Minimal assitance, Sitting Lower Body Bathing: Maximal assistance, Sit to/from  stand Upper Body Dressing : Moderate assistance, Sitting Lower Body Dressing: Total assistance, +2 for physical assistance, Sit to/from stand Toilet Transfer: Minimal assistance, +2 for physical assistance, Stand-pivot (from recliner to bed going to pt's left) Toileting- Clothing Manipulation and Hygiene: Set up, Sitting/lateral lean Toileting - Clothing Manipulation Details (indicate cue type and reason): pt was able to use wet washcloth  to perform hygiene while sitting on BSC with lateral leans General ADL Comments: Pt was limited by lethargy this afternoon, however was motivated to get to [facility] for toileting after sitting EOB. Pt continues to c/o nausea but reports she is not currently dizzy.     Mobility  Overal bed mobility: Needs Assistance Bed Mobility: Supine to Sit, Sit to Supine Rolling: Mod assist Sidelying to sit: +2 for physical assistance, HOB elevated, Max assist Sit to supine: Mod assist Sit to sidelying: Mod assist General bed mobility comments: verbal cues for technique, assist to elevate trunk to upright, incr time to perform, nauseated with positional change.    Transfers  Overall transfer level: Needs assistance Equipment used: 2 person hand held assist Transfers: Sit to/from Stand Sit to Stand: Max assist, +2 physical assistance, From elevated surface Stand pivot transfers: +2 physical assistance, Min assist General transfer comment: cues for UE use, movement through pivot, max cueing for gaze stabilization. pt notes less dizziness with coming to stand than wen came to sitting position. Facilitation for upright posture and maintaining center of balance.     Ambulation / Gait / Stairs / Wheelchair Mobility  Ambulation/Gait Ambulation/Gait assistance: Mod assist, Max assist, +2 physical assistance Ambulation Distance (Feet): 6 Feet Assistive device: 2 person hand held assist Gait Pattern/deviations: Step-to pattern, Decreased stride length, Shuffle, Trunk  flexed Gait velocity: decreased Gait velocity interpretation: Below normal speed for age/gender General Gait Details: limited by nausea, unable to determine normal gait pattern or possible ataxic coordination with gait due to limited overall mobility re: nausea. Significant lean to right. Target "A" did help pt with orientation but pt needed constant cues to keep eyes on target "A". Pt was letting her body get ahead of her feet as well with very poor balance. Was unable to progress ambulation due to pt with poor postural stablity flexing more and more as she stood and nauseated and requested to sit down. called for nausea med and encouraged pt to keep nausea med in her system so she can work with therapy. This PT reiterated to daughter the importance of pt sitting up in chair for at least an hour 2 x day if possible. Positioned pt comfortably in chair upon departure. Once pt in chair, called nurse to bring nausea med as pt said she was going to vomit. Pt did vomit in basin and noted that pt looked like she was becoming lethargic. Raised feet in recliner and check BP as pt was orthostatic. MD made aware and to change some of pts medications.     Posture / Balance Dynamic Sitting Balance Sitting balance - Comments: pt with R lateral lean and needs kinesthetic input of being allowed to tip to R side for pt to realize she's leaning. Max cueing for use of "A" as target for gaze stabilization. In sitting R Nystagmus noted.  Balance Overall balance assessment: Needs assistance, History of Falls Sitting-balance support: Bilateral upper extremity supported, Feet supported Sitting balance-Leahy Scale: Poor Sitting balance - Comments: pt with R lateral lean and needs kinesthetic input of being allowed to tip to R side for pt to realize she's leaning. Max cueing for use of "A" as target for gaze stabilization. In sitting R Nystagmus noted.  Postural control: Right lateral lean, Posterior  lean Standing balance support: Bilateral upper extremity supported, During functional activity Standing balance-Leahy Scale: Poor Standing balance comment: Pt needs frequent cuing to use "A". Needed constant facilitation ffor upright posture as pt flexed.  High level  balance activites: Direction changes, Turns, Sudden stops, Head turns High Level Balance Comments: Pt can do none of the above. Too unsteady.     Special needs/care consideration BiPAP/CPAP No CPM No Continuous Drip IV 3% NS drip stopped this am 02/09/14. Dialysis No  Life Vest No Oxygen Yes, on 02 [place] in hospital Special Bed No Trach Size No Wound Vac (area) No  Skin No  Bowel mgmt: Last BM 02/08/14 Bladder mgmt: Urinary catheter Diabetic mgmt No    Previous Home Environment Living Arrangements: Children Available Help at Discharge: Family Type of Home: House Home Layout: Able to live on main level with bedroom/bathroom Home Care Services: No  Discharge Living Setting Plans for Discharge Living Setting: House, Lives with (comment) (Plans to go home with her daughter. Was staying with dtr.) Type of Home at Discharge: House Discharge Home Layout: One level Discharge Home Access: Stairs to enter Entrance Stairs-Number of Steps: 9 steps at front and 2 steps down off deck at the back entrance. Does the patient have any problems obtaining your medications?: No  Social/Family/Support Systems Patient Roles: Spouse, Parent (Has a husband, son and daughter.) Contact Information: Reche Dixon - daughter  Anticipated Caregiver: daughter Anticipated Caregiver's Contact Information: [name] - dtr (h) [phone] (c) [phone] Ability/Limitations of Caregiver: Dtr may be able to stay with patient and work some from home. Caregiver Availability: 24/7 Discharge Plan Discussed with Primary Caregiver: Yes Is Caregiver In Agreement with Plan?: Yes Does Caregiver/Family have  Issues with Lodging/Transportation while Pt is in Rehab?: No  Goals/Additional Needs Patient/Family Goal for Rehab: PT/OT S/min assist, ST mod I/S goals Expected length of stay: 15-20 days Cultural Considerations: Baptist Dietary Needs: Dys II, thin liquids Equipment Needs: TBD Pt/Family Agrees to Admission and willing to participate: Yes Program Orientation Provided & Reviewed with Pt/Caregiver Including Roles & Responsibilities: Yes  Decrease burden of Care through IP rehab admission: N/A  Possible need for SNF placement upon discharge: Yes, if patient does not progress to point where daughter can manage from home, may need SNF at the end of inpatient rehab stay.  Patient Condition: This patient's medical and functional status has changed since the consult dated: 02/03/14 in which the Rehabilitation Physician determined and documented that the patient's condition is appropriate for intensive rehabilitative care in an inpatient rehabilitation facility. See "History of Present Illness" (above) for medical update. Functional changes are: Currently requiring mod/max assist for transfers and amb 6 ft with +2 HHA. Patient's medical and functional status update has been discussed with the Rehabilitation physician and patient remains appropriate for inpatient rehabilitation. Will admit to inpatient rehab today.  Preadmission Screen Completed By: Retta Diones, 02/09/2014 11:06 AM ______________________________________________________________________  Discussed status with Dr. Naaman Plummer on 02/09/14 at 84 and received telephone approval for admission today.  Admission Coordinator: Retta Diones, time1106/Date02/03/16          Cosigned by: Meredith Staggers, MD at 02/09/2014 11:15 AM  Revision History

## 2014-02-09 NOTE — Progress Notes (Signed)
Pt was told by primary doctor to not take anything stronger than Plavix, or it would kill her. She called her daughter to ask if she should take lovenox or not, and her daughter wanted me to consult the Doctor about her ability to take Lovenox. Called PA on call, Danella Sensing, to notify her of the daughters concerns, and let her know that pt did not have morning dose of Plavix, therefore has not had a blood thinner today. PA said she would call back, no new orders at this time. Will report to am nurse for follow up. Kennieth Francois, RN

## 2014-02-09 NOTE — Progress Notes (Signed)
Tammy Sensing, PA called back and said if pharmacy confirms that pt did not have Plavix this am, then we can give a dose tonight, and schedule the administrations accordingly. Spoke to family on the phone, and they want her to have her plavix tonight, but refused her to take any Lovenox until daughter speaks to the doctor directly. She will come in the am to discuss her concerns with lovenox. Kennieth Francois, RN

## 2014-02-09 NOTE — Progress Notes (Signed)
Pt refused to drink Breeze, but drank a Boost Chocolate. She said she likes the taste of those and would be willing to drink those. Kennieth Francois, RN

## 2014-02-09 NOTE — Progress Notes (Signed)
Pt refused to take medications and wanted to take them later. Rehab RN notified.

## 2014-02-10 ENCOUNTER — Inpatient Hospital Stay (HOSPITAL_COMMUNITY): Payer: Medicare Other | Admitting: Physical Therapy

## 2014-02-10 ENCOUNTER — Inpatient Hospital Stay (HOSPITAL_COMMUNITY): Payer: Medicare Other | Admitting: Occupational Therapy

## 2014-02-10 ENCOUNTER — Inpatient Hospital Stay (HOSPITAL_COMMUNITY): Payer: Medicare Other | Admitting: Speech Pathology

## 2014-02-10 DIAGNOSIS — R270 Ataxia, unspecified: Secondary | ICD-10-CM

## 2014-02-10 DIAGNOSIS — F508 Other eating disorders: Secondary | ICD-10-CM

## 2014-02-10 LAB — CBC WITH DIFFERENTIAL/PLATELET
BASOS ABS: 0 10*3/uL (ref 0.0–0.1)
BASOS PCT: 0 % (ref 0–1)
Eosinophils Absolute: 0.2 10*3/uL (ref 0.0–0.7)
Eosinophils Relative: 2 % (ref 0–5)
HEMATOCRIT: 29.8 % — AB (ref 36.0–46.0)
Hemoglobin: 10.6 g/dL — ABNORMAL LOW (ref 12.0–15.0)
Lymphocytes Relative: 16 % (ref 12–46)
Lymphs Abs: 1.5 10*3/uL (ref 0.7–4.0)
MCH: 32.2 pg (ref 26.0–34.0)
MCHC: 35.6 g/dL (ref 30.0–36.0)
MCV: 90.6 fL (ref 78.0–100.0)
MONO ABS: 0.9 10*3/uL (ref 0.1–1.0)
Monocytes Relative: 9 % (ref 3–12)
Neutro Abs: 7.2 10*3/uL (ref 1.7–7.7)
Neutrophils Relative %: 73 % (ref 43–77)
Platelets: 196 10*3/uL (ref 150–400)
RBC: 3.29 MIL/uL — AB (ref 3.87–5.11)
RDW: 12.2 % (ref 11.5–15.5)
WBC: 9.8 10*3/uL (ref 4.0–10.5)

## 2014-02-10 LAB — COMPREHENSIVE METABOLIC PANEL
ALT: 44 U/L — AB (ref 0–35)
ANION GAP: 6 (ref 5–15)
AST: 38 U/L — AB (ref 0–37)
Albumin: 2.8 g/dL — ABNORMAL LOW (ref 3.5–5.2)
Alkaline Phosphatase: 103 U/L (ref 39–117)
BUN: 10 mg/dL (ref 6–23)
CALCIUM: 8.7 mg/dL (ref 8.4–10.5)
CHLORIDE: 101 mmol/L (ref 96–112)
CO2: 30 mmol/L (ref 19–32)
Creatinine, Ser: 0.7 mg/dL (ref 0.50–1.10)
GFR calc Af Amer: >90 mL/min (ref >90)
GFR, EST NON AFRICAN AMERICAN: 80 mL/min — AB (ref >90)
Glucose, Bld: 101 mg/dL — ABNORMAL HIGH (ref 70–99)
POTASSIUM: 2.7 mmol/L — AB (ref 3.5–5.1)
Sodium: 137 mmol/L (ref 135–145)
Total Bilirubin: 0.6 mg/dL (ref 0.3–1.2)
Total Protein: 5.2 g/dL — ABNORMAL LOW (ref 6.0–8.3)

## 2014-02-10 MED ORDER — PANTOPRAZOLE SODIUM 40 MG PO PACK
40.0000 mg | PACK | Freq: Every day | ORAL | Status: DC
  Administered 2014-02-11 – 2014-02-13 (×3): 40 mg
  Filled 2014-02-10 (×6): qty 20

## 2014-02-10 MED ORDER — POTASSIUM CHLORIDE 20 MEQ/15ML (10%) PO SOLN
20.0000 meq | Freq: Two times a day (BID) | ORAL | Status: DC
  Administered 2014-02-10 – 2014-02-12 (×6): 20 meq via ORAL
  Filled 2014-02-10 (×9): qty 15

## 2014-02-10 MED ORDER — POTASSIUM CHLORIDE CRYS ER 20 MEQ PO TBCR
30.0000 meq | EXTENDED_RELEASE_TABLET | Freq: Two times a day (BID) | ORAL | Status: DC
  Filled 2014-02-10 (×4): qty 1

## 2014-02-10 NOTE — Progress Notes (Signed)
Social Work Assessment and Plan Social Work Assessment and Plan  Patient Details  Name: Federica Allport MRN: [id number] Date of Birth: 05/31/1933  Today's Date: 02/10/2014  Problem List:  Patient Active Problem List   Diagnosis Date Noted  . Pica 02/09/2014  . 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 Malnutrition of moderate degree 02/06/2014  . Obstructive hydrocephalus   . Malnutrition   . Hypokalemia   . Occlusion of posterior inferior cerebellar artery with infarction   . Cerebellar stroke   . Cytotoxic cerebral edema 02/01/2014  . Cerebellar stroke, acute   . Nausea without vomiting   . Acute CVA (cerebrovascular accident) 01/31/2014  . Essential hypertension 01/31/2014  . Compression fracture of thoracic spine, non-traumatic 01/19/2014  . Hyperlipemia   . Dementia   . Cerebral hemorrhage, nontraumatic   . CVA (cerebral infarction)   . Ureterocele, acquired    Past Medical History:  Past Medical History  Diagnosis Date  . Hypertension   . Hyperlipemia   . Dementia   . Cerebral hemorrhage, nontraumatic 2011  . CVA (cerebral infarction) 2011    left cerebellum  . Ureterocele, acquired    Past Surgical History:  Past Surgical History  Procedure Laterality Date  . Abdominal hysterectomy    . Cholecystectomy     Social History:  reports that she has quit smoking. She does not have any smokeless tobacco history on file. She reports that she does not drink alcohol or use illicit drugs.  Family / Support Systems Marital Status: Married Patient Roles: Spouse, Parent, Caregiver Children: Barbaraann Faster [phone] Other Supports: Lenord Carbo [phone]   Son  currently has husband at his home. Anticipated Caregiver: Daughter and son in-law along with some hired assist Ability/Limitations of Caregiver: Piecing together 24 hr care-daughter needs to go back to work Caregiver Availability: Other  (Comment) (Developing a 24 hr care plan) Family Dynamics: Close knit family have been dealing with parents since Mom's compression fracture 2 weeks ago.  Daughter and brother have been piecing together care-pt was the caregiver of her husband prior to her fracture and now this. They are doing the best they can by providing care to the both of them.  Social History Preferred language: English Religion: Baptist Cultural Background: No issues Education: [employer] Read: Yes Write: Yes Employment Status: Retired Freight forwarder Issues: No issues Guardian/Conservator: None-according to MD pt is capable of making her own decisions, daughter is also her POA and has paperwork in soft chart   Abuse/Neglect Physical Abuse: Denies Verbal Abuse: Denies Sexual Abuse: Denies Exploitation of patient/patient's resources: Denies Self-Neglect: Denies  Emotional Status Pt's affect, behavior adn adjustment status: Pt wants to do well here, her main issue is her nausea from this stroke.  She has good movement but still struggles with her back pain from the compression fracture. She has always been independent and was the caregiver for her husband before this.  She will try her best and push through the nausea. Recent Psychosocial Issues: Recent compression fracture 2 weeks ago, and other health issues Pyschiatric History: No history-deferred depression screening due to doing well and pushing thorugh her nausea.  Will monitor and see if needs to intervene later and observe through treatment team. Substance Abuse History: No issues  Patient / Family Perceptions, Expectations & Goals Pt/Family understanding of illness & functional limitations: Pt and daughter have a good understanding of her stroke and deficits.  Both are hopeful she will get through  the nausea and do well here.  Her strength is good.  Both feel their questiosn are being addressed and concerns are heard.  Daughter was here early am  to disucss concerns with MD regaridng blood thinners. Premorbid pt/family roles/activities: Wife, Mother, grandmother, retiree, caregiver, church member, etc Anticipated changes in roles/activities/participation: resume Pt/family expectations/goals: Pt states: " Do you believe I'm 81 years old I shouldn't have do deal with this."  She is willing to do waht she can for herself.  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) Premorbid Home Care/DME Agencies: Other (Comment) (has DME from recent compression fracture) Transportation available at discharge: family Resource referrals recommended: Support group (specify)  Discharge Planning Living Arrangements: Children Support Systems: Spouse/significant other, Children, Other relatives, Friends/neighbors, Church/faith community Type of Residence: Private residence Insurance Resources: Multimedia programmer (specify) Forensic psychologist Medicare) Museum/gallery curator Resources: Radio broadcast assistant Screen Referred: No Living Expenses: Lives with family Money Management: Patient, Family Does the patient have any problems obtaining your medications?: No Home Management: Pt was doing home management at her home, but now at daughter's and recent compression fracutre she doesn't.  Daughter does it. Patient/Family Preliminary Plans: Return to daughter's home where duaghter is trying to set up 24 hr care with other family members and hired agency for the daytime while she and her husband work.  Will work with both on realistic plan and provide support while here. Social Work Anticipated Follow Up Needs: HH/OP, Support Group  Clinical Impression Pleasant female who has always been one to care for others and not be in the patient role.  Her daughter and son have been piecing together care  for both of their parents and now need to return back to work. Daughter is realistic thinking Mom can not be a caregiver for husband again and now need to come up with a more permanent plan. Will inform her once team's goals made and work on plan for discharge. Daughter plans to return back to work tomorrow.  Elease Hashimoto 02/10/2014, 10:45 AM

## 2014-02-10 NOTE — Evaluation (Signed)
Physical Therapy Assessment and Plan  Patient Details  Name: [name] MRN: [id number] Date of Birth: 1933/12/15  PT Diagnosis: Abnormal posture, Abnormality of gait, Ataxia, Coordination disorder, Impaired cognition and Muscle weakness Rehab Potential: Good ELOS: 16-18 days   Today's Date: 02/11/2014 PT Individual Time:  5277-8242 PT Individual Time Calculation (min): 60 min    Problem List:  Patient Active Problem List   Diagnosis Date Noted  . Pica 02/09/2014  . 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 Malnutrition of moderate degree 02/06/2014  . Obstructive hydrocephalus   . Malnutrition   . Hypokalemia   . Occlusion of posterior inferior cerebellar artery with infarction   . Cerebellar stroke   . Cytotoxic cerebral edema 02/01/2014  . Cerebellar stroke, acute   . Nausea without vomiting   . Acute CVA (cerebrovascular accident) 01/31/2014  . Essential hypertension 01/31/2014  . Compression fracture of thoracic spine, non-traumatic 01/19/2014  . Hyperlipemia   . Dementia   . Cerebral hemorrhage, nontraumatic   . CVA (cerebral infarction)   . Ureterocele, acquired     Past Medical History:  Past Medical History  Diagnosis Date  . Hypertension   . Hyperlipemia   . Dementia   . Cerebral hemorrhage, nontraumatic 2011  . CVA (cerebral infarction) 2011    left cerebellum  . Ureterocele, acquired    Past Surgical History:  Past Surgical History  Procedure Laterality Date  . Abdominal hysterectomy    . Cholecystectomy      Assessment & Plan Clinical Impression: [name] is a 81 y.o.right handed female with history of hypertension, dementia, osteoporosis, nontraumatic cerebral hemorrhage 2011.Marland Kitchen Patient lives with family used a rolling walker prior to admission. Presented 01/31/2014 with nausea vomiting as well as dizziness and unsteady gait. MRI showed acute left posterior inferior cerebellar artery territory infarct with  petechial hemorrhage. MRA with mid stenosis right P2 segment with luminal irregularity of the bilat posterior cerebral arteries consistent with atherosclerosis. Echocardiogram with ejection fraction 35% grade 1 diastolic dysfunction. Carotid Doppler no ICA stenosis. Patient did not receive TPA. CT angiogram head and neck 02/03/2014 shows acute infarct left inferior cerebellum in PICA territory with mass effect on the fourth ventricle. No obstructive hydrocephalus or hemorrhage. Bilateral carotid atherosclerotic disease noted. Moderately severe stenosis proximal right external carotid artery. 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 Follow up CT Head 02/07/2014 with stable appearance, ventriculomegaly slightly improved. Neurology consulted presently on Plavix for CVA prophylaxis with the addition of Lovenox for DVT prophylaxis. Bouts of hypokalemia resolved with potassium supplement. Intermittent bouts of refractory nausea/dizziness and no response with Thorazine or Phenergan. Latest CT abdomen and pelvis unremarkable.   Patient transferred to CIR on 02/09/2014 .   Patient currently requires total with mobility secondary to muscle weakness, impaired timing and sequencing, unbalanced muscle activation, ataxia and decreased coordination, decreased midline orientation, decreased initiation, decreased problem solving and decreased memory, central origin and decreased sitting balance, decreased standing balance, decreased postural control and decreased balance strategies.  Prior to hospitalization, patient was modified independent  with mobility and lived with Daughter (had moved in with her daughter after a recent back injury;  prior to that pt was living independently with her husband ) in a House home.  Home access is 2 steps to back porch with no railsStairs to enter.  Patient will  benefit from skilled  PT intervention to maximize safe functional mobility and minimize fall risk for planned discharge home with 24 hour supervision.  Anticipate patient will benefit from follow up [place] at discharge.  PT - End of Session Activity Tolerance: Tolerates 30+ min activity with multiple rests Endurance Deficit: Yes Endurance Deficit Description: lethargic PT Assessment Rehab Potential (ACUTE/IP ONLY): Good Barriers to Discharge: [place] home environment;Other (comment) (No rails on 2 steps to enter home) PT Patient demonstrates impairments in the following area(s): Balance;Endurance;Motor;Perception;Safety;Sensory PT Transfers Functional Problem(s): Bed Mobility;Bed to Chair;Car;Furniture;Floor PT Locomotion Functional Problem(s): Ambulation;Wheelchair Mobility;Stairs PT Plan PT Intensity: Minimum of 1-2 x/day ,45 to 90 minutes PT Frequency: 5 out of 7 days PT Duration Estimated Length of Stay: 16-18 days PT Treatment/Interventions: Ambulation/gait training;Balance/vestibular training;Cognitive remediation/compensation;Disease management/prevention;Discharge planning;DME/adaptive equipment instruction;Functional mobility training;Patient/family education;Therapeutic Exercise;Visual/perceptual remediation/compensation;UE/LE Coordination activities;Therapeutic Activities;Neuromuscular re-education;Stair training;UE/LE Strength taining/ROM;Wheelchair propulsion/positioning PT Transfers Anticipated Outcome(s): Supervision PT Locomotion Anticipated Outcome(s): Ambulatory with supervision to Min A  Skilled Therapeutic Intervention PT evaluation performed. See below for detailed findings. Treatment initiated. Session focused on gait training and stair negotiation. See below for detailed description of assist/cueing required with bed mobility, w/c mobility, gait and stairs. Educated pt on findings, goals, and plan of care. Oriented pt to rehab unit, fall precautions. Pt verbalized  understanding of all education and was in full agreement with plan of care. Departed with pt semi reclined in bed with 3 rails up, bed alarm on, and all needs within reach.  PT Evaluation Precautions/Restrictions  Fall General   Vital SignsTherapy Vitals Resp: 16 Patient Position (if appropriate): Orthostatic Vitals Oxygen Therapy SpO2: 97 % O2 Device: Not Delivered Pain Pain Assessment Pain Assessment: No/denies pain Home Living/Prior Functioning Home Living Available Help at Discharge: Family Type of Home: House Home Access: Stairs to enter [employer] of Steps: 2 steps to back porch with no rails Entrance Stairs-Rails: None Home Layout: Able to live on main level with bedroom/bathroom Additional Comments: has tub bench  Lives With: Daughter (had moved in with her daughter after a recent back injury;  prior to that pt was living independently with her husband ) Prior Function Level of Independence: Requires assistive device for independence;Needs assistance with ADLs (prior to back injury pt was independent with all mobility and self-care tasks) Vision/Perception  Vision - Assessment Additional Comments: To be further assessed, pt unable to keep eyes open due to combination of lethargy and fear of dizziness/nausea.  Pt reports she feels better with her eyes closed.  Cognition Overall Cognitive Status: History of cognitive impairments - at baseline Orientation Level: Oriented to person;Other (comment);Disoriented to person;Oriented to situation;Disoriented to time (For place, pt looked at badge and stated "somewhere over close to [facility]") Attention: Selective Selective Attention: Impaired Selective Attention Impairment: Verbal complex;Functional complex Memory: Impaired Memory Impairment: Decreased recall of new information;Decreased short term memory Decreased Short Term Memory: Verbal complex Awareness: Impaired Awareness Impairment: Emergent  impairment;Intellectual impairment Safety/Judgment: Appears intact Sensation Sensation Light Touch: Appears Intact Stereognosis: Not tested Hot/Cold: Not tested Proprioception: Appears Intact Coordination Gross Motor Movements are Fluid and Coordinated: No Fine Motor Movements are Fluid and Coordinated: No Finger Nose Finger Test: slight deviation and "shakiness" with Lt hand, slow movements BUE Heel Shin Test: Ataxic in LLE; limited excursion in bilat LE's Motor  Motor Motor: Ataxia;Abnormal postural alignment and control Motor - Skilled Clinical Observations: Limited cervical/thoracic spine extension in seated, standing, and with ambulation.  Mobility Bed Mobility Bed Mobility: Supine to Sit;Sit to Supine;Scooting to [facility] Supine to Sit:  1: +1 Total assist;HOB flat Supine to Sit Details: Tactile cues for initiation;Tactile cues for sequencing;Verbal cues for technique;Verbal cues for sequencing Supine to Sit Details (indicate cue type and reason): Total A and max multimodal cueing for technique, sequencing, and initiaton due to decreased pt arousal during initial 10 minutes of PT evaluation. Sit to Supine: 3: Mod assist;HOB flat Sit to Supine - Details: Tactile cues for sequencing;Verbal cues for sequencing;Verbal cues for technique Sit to Supine - Details (indicate cue type and reason): Mod A for bilat LE management Scooting to HOB: 3: Mod assist;With rail Scooting to [facility] Details: Manual facilitation for placement;Tactile cues for weight bearing;Verbal cues for technique;Verbal cues for sequencing Scooting to [facility] Details (indicate cue type and reason): Tactile cueing at bilat LE's for increased weightbearing; manual placement of hands at bilat bed rails. Transfers Transfers: Yes Sit to Stand: 3: Mod assist;With upper extremity assist;From bed;From chair/3-in-1 Sit to Stand Details: Manual facilitation for weight shifting;Tactile cues for weight beaing;Tactile cues for  placement;Verbal cues for technique Stand to Sit: 3: Mod assist Stand to Sit Details (indicate cue type and reason): Verbal cues for precautions/safety;Manual facilitation for weight shifting;Tactile cues for placement;Verbal cues for sequencing Stand Pivot Transfers: 2: Max assist Stand Pivot Transfer Details: Manual facilitation for weight shifting;Verbal cues for sequencing;Tactile cues for sequencing;Verbal cues for technique;Verbal cues for precautions/safety Squat Pivot Transfers: 3: Mod assist Squat Pivot Transfer Details: Tactile cues for sequencing;Verbal cues for sequencing;Tactile cues for weight shifting;Verbal cues for technique Locomotion  Ambulation Ambulation: Yes Ambulation/Gait Assistance: 1: +2 Total assist;2: Max assist;Other (comment) (+2A for w/c follow) Ambulation Distance (Feet): 13 Feet Assistive device: Other (Comment) (R then L hallway hand rail) Ambulation/Gait Assistance Details: Manual facilitation for weight shifting Ambulation/Gait Assistance Details: Pt ambulated x6' with R hallway hand rail then turned with bilat UE support at hallway hand rail and ambulated additional 7' with L hand rail. Max A required for stability, +2A for w/c follow. Manual facilitation required for lateral weight shift to L side due to strong lateral trunk lean to R side. Gait Gait: Yes Gait Pattern: Impaired Gait Pattern: Step-through pattern;Decreased step length - right;Decreased stance time - left;Decreased weight shift to left;Right flexed knee in stance;Left flexed knee in stance;Narrow base of support;Trunk flexed;Lateral trunk lean to right;Decreased dorsiflexion - left;Decreased dorsiflexion - right Stairs / Additional Locomotion Stairs: Yes Stairs Assistance: 1: +2 Total assist Stairs Assistance Details: Manual facilitation for weight shifting;Tactile cues for posture;Verbal cues for technique Stair Management Technique: Step to pattern;Two rails;Forwards Number of Stairs:  3 Height of Stairs: 4.5 Wheelchair Mobility Wheelchair Mobility: Yes Wheelchair Assistance: 4: Advertising account executive Details: Verbal cues for technique;Tactile cues for weight beaing;Tactile cues for posture Wheelchair Propulsion: Both lower extermities Wheelchair Parts Management: Needs assistance Distance: 40  Trunk/Postural Assessment  Cervical Assessment Cervical Assessment: Exceptions to [facility] (forward head) Thoracic Assessment Thoracic Assessment: Exceptions to [facility] (limited by pain associated with recent thoracic spine injury) Lumbar Assessment Lumbar Assessment: Exceptions to [facility] (posterior pelvic tilt) Postural Control Postural Control: Deficits on evaluation Trunk Control: Tendency toward lateral trunk lean to R side and posteriorly in seated. With standing/gait, pt pushing trunk to R. Righting Reactions: Seated: none noted with LOB to R side; requires near LOB posteriorly for (slow) righting reaction  Balance Balance Balance Assessed: Yes Static Sitting Balance Static Sitting - Balance Support: Feet supported;Left upper extremity supported Static Sitting - Level of Assistance: 5: Stand by assistance;4: Min assist Static Sitting - Comment/# of Minutes: Seated EOB  x5 minutes, pt required close supervision to min guard and max multimodal cueing to prevent LOB to R side and posteriorly Static Standing Balance Static Standing - Balance Support: Bilateral upper extremity supported Static Standing - Level of Assistance: 3: Mod assist Extremity Assessment  RUE Assessment RUE Assessment: Within Functional Limits LUE Assessment LUE Assessment: Within Functional Limits RLE Assessment RLE Assessment: Exceptions to [facility] RLE Strength RLE Overall Strength: Deficits RLE Overall Strength Comments: grossly WFL with exception of 4-/5 to 4/5 in hip extensors, abductiors LLE Assessment LLE Assessment: Exceptions to [facility] LLE Strength LLE Overall Strength: Deficits LLE Overall  Strength Comments: grossly WFL with exception of 4-/5 to 4/5 in hip extensors, abductiors  FIM:  FIM - Bed/Chair Transfer Bed/Chair Transfer: 1: Supine > Sit: Total A (helper does all/Pt. < 25%);3: Sit > Supine: Mod A (lifting assist/Pt. 50-74%/lift 2 legs);2: Bed > Chair or W/C: Max A (lift and lower assist);3: Chair or W/C > Bed: Mod A (lift or lower assist) FIM - Locomotion: Wheelchair Distance: 40 Locomotion: Wheelchair: 1: Travels less than 50 ft with minimal assistance (Pt.>75%) FIM - Locomotion: Ambulation Locomotion: Ambulation Assistive Devices: Other (comment) (L then R hallway hand rail) Ambulation/Gait Assistance: 1: +2 Total assist;2: Max assist;Other (comment) (+2A for w/c follow) Locomotion: Ambulation: 1: Two helpers FIM - Locomotion: Stairs Locomotion: Scientist, physiological: Insurance account manager - 2 Locomotion: Stairs: 1: Two helpers   Refer to [employer] for Long Term Goals  Recommendations for other services: None  Discharge Criteria: Patient will be discharged from PT if patient refuses treatment 3 consecutive times without medical reason, if treatment goals not met, if there is a change in medical status, if patient makes no progress towards goals or if patient is discharged from hospital.  The above assessment, treatment plan, treatment alternatives and goals were discussed and mutually agreed upon: by patient  Stefano Gaul 02/11/2014, 8:19 AM

## 2014-02-10 NOTE — Evaluation (Signed)
Speech Language Pathology Assessment and Plan  Patient Details  Name: [name] MRN: [id number] Date of Birth: 09/28/1933  SLP Diagnosis: Cognitive Impairments;Dysphagia  Rehab Potential: Good ELOS: 7-10 days     Today's Date: 02/10/2014 SLP Individual Time: 1740-8144 SLP Individual Time Calculation (min): 63 min   Problem List:  Patient Active Problem List   Diagnosis Date Noted  . Pica 02/09/2014  . 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 Malnutrition of moderate degree 02/06/2014  . Obstructive hydrocephalus   . Malnutrition   . Hypokalemia   . Occlusion of posterior inferior cerebellar artery with infarction   . Cerebellar stroke   . Cytotoxic cerebral edema 02/01/2014  . Cerebellar stroke, acute   . Nausea without vomiting   . Acute CVA (cerebrovascular accident) 01/31/2014  . Essential hypertension 01/31/2014  . Compression fracture of thoracic spine, non-traumatic 01/19/2014  . Hyperlipemia   . Dementia   . Cerebral hemorrhage, nontraumatic   . CVA (cerebral infarction)   . Ureterocele, acquired    Past Medical History:  Past Medical History  Diagnosis Date  . Hypertension   . Hyperlipemia   . Dementia   . Cerebral hemorrhage, nontraumatic 2011  . CVA (cerebral infarction) 2011    left cerebellum  . Ureterocele, acquired    Past Surgical History:  Past Surgical History  Procedure Laterality Date  . Abdominal hysterectomy    . Cholecystectomy      Assessment / Plan / Recommendation Skilled Therapeutic Interventions          [name] is a 81 y.o.right handed female with history of hypertension, dementia, osteoporosis, nontraumatic cerebral hemorrhage 2011. Patient lives with family used a rolling walker prior to admission. Presented 01/31/2014 with nausea vomiting as well as dizziness and unsteady gait. MRI showed acute left posterior inferior cerebellar artery territory infarct with petechial hemorrhage.  Tolerating a dysphagia 2, thin liquid diet. Physical and occupational therapy evaluations completed 02/01/2014 recommendations of physical medicine rehabilitation consult. Patient was admitted for comprehensive rehabilitation program on 02/09/2014.  SLP evaluation was completed on 02/10/2014 with the following results: Pt presents with a mild oral dysphagia characterized by decreased manipulation of boluses with delayed oral transit.  Pt demonstrated effective clearance of residual solids from the oral cavity with extra time.  No overt s/s of aspiration were noted with solid or liquid consistencies.  Per pt's daughter, pt has had significantly poor intake while inpatient.  Recommend advancing pt's diet to dys 3 solids with continued thin liquids, intermittent supervision/tray set up.  Pt may have meds whole with water as tolerated or with purees if needed.   Pt also presents with mild cognitive impairments characterized by decreased recall of new information.  Per report, pt had memory deficits prior to admission due to previous history of cerebral hemorrhage and dementia.  These deficits are only slightly worse in the setting of acute CVA per pt's daughter.  Pt compensated for her baseline memory deficits and was the primary caregiver for her husband prior to admission; therefore, pt would benefit from skilled ST while inpatient in order to maximize functional independence and reduce burden of care prior to discharge.  Anticipate that pt will need 24/7 supervision and assistance for medication and financial management at discharge.  Pt may benefit from skilled ST at discharge; however, given that pt is likely near her cognitive baseline and her diet has now been advanced, SLP will update discharge recommendations as appropriate pending progress made while  inpatient.     SLP Assessment  Patient will need skilled Speech Lanaguage Pathology Services during CIR admission    Recommendations  Diet Recommendations:  Dysphagia 3 (Mechanical Soft);Thin liquid Liquid Administration via: Cup;Straw Medication Administration: Whole meds with liquid (as tolerated to facilitate a more natural context for administering meds (i.e. pt used water to take meds at baseline) Supervision: Patient able to self feed;Intermittent supervision to cue for compensatory strategies Compensations: Slow rate;Small sips/bites Postural Changes and/or Swallow Maneuvers: Seated upright 90 degrees;Out of bed for meals Oral Care Recommendations: Oral care BID Recommendations for Other Services: Neuropsych consult Patient destination: Home Follow up Recommendations: 24 hour supervision/assistance;Other (comment) (SLP follow up TBD) Equipment Recommended: None recommended by SLP    SLP Frequency 5 out of 7 days   SLP Treatment/Interventions Dysphagia/aspiration precaution training;Patient/family education;Functional tasks;Internal/external aids;Environmental controls;Cueing hierarchy;Cognitive remediation/compensation    Pain Pain Assessment Pain Assessment: No/denies pain Prior Functioning Cognitive/Linguistic Baseline: Baseline deficits Baseline deficit details: baseline memory deficits due to previous history of hemorrhage with concomitant dementia; was caring for her husband and managing her household with intermittent assist from family and use of compensatory strategies  Type of Home: House  Lives With: Daughter (had moved in with her daughter after a recent back injury;  prior to that pt was living independently with her husband ) Available Help at Discharge: Family  Short Term Goals: Week 1: SLP Short Term Goal 1 (Week 1): Pt will tolerate presentations of her currently prescribed diet with no overt s/s of aspiration and supervision cues for use of swallowing precautions.   SLP Short Term Goal 2 (Week 1): Pt will tolerate trials of advanced consistencies with supervision cues to monitor and correct pocketing.   SLP Short Term  Goal 3 (Week 1): Pt will improve use of compensatory aids for memory to facilitate improved recall of daily information with min assist.   See FIM for current functional status Refer to Care Plan for Long Term Goals  Recommendations for other services: Neuropsych  Discharge Criteria: Patient will be discharged from SLP if patient refuses treatment 3 consecutive times without medical reason, if treatment goals not met, if there is a change in medical status, if patient makes no progress towards goals or if patient is discharged from hospital.  The above assessment, treatment plan, treatment alternatives and goals were discussed and mutually agreed upon: by patient and by family  Remmi Armenteros, Selinda Orion 02/10/2014, 11:30 AM

## 2014-02-10 NOTE — Progress Notes (Signed)
81 y.o.right handed female with history of hypertension, dementia, osteoporosis, nontraumatic cerebral hemorrhage 2011.Marland Kitchen Patient lives with family used a rolling walker prior to admission. Presented 01/31/2014 with nausea vomiting as well as dizziness and unsteady gait. MRI showed acute left posterior inferior cerebellar artery territory infarct with petechial hemorrhage. MRA with mid stenosis right P2 segment with luminal irregularity of the bilat posterior cerebral arteries consistent with atherosclerosis. Echocardiogram with ejection fraction 79% grade 1 diastolic dysfunction. Carotid Doppler no ICA stenosis. Patient did not receive TPA. CT angiogram head and neck 02/03/2014 shows acute infarct left inferior cerebellum in PICA territory with mass effect on the fourth ventricle  Subjective/Complaints: Daughter with multiple concerns regarding how blood thinners may cause bleeding in brain  Discussed K level and supplementation Objective: Vital Signs: Blood pressure 124/84, pulse 124, temperature 98.6 F (37 C), temperature source Oral, resp. rate 17, height 5' 4"  (1.626 m), weight 60.5 kg (133 lb 6.1 oz), SpO2 97 %. No results found. Results for orders placed or performed during the hospital encounter of 02/09/14 (from the past 72 hour(s))  CBC     Status: Abnormal   Collection Time: 02/09/14  2:51 PM  Result Value Ref Range   WBC 10.3 4.0 - 10.5 K/uL   RBC 3.39 (L) 3.87 - 5.11 MIL/uL   Hemoglobin 10.9 (L) 12.0 - 15.0 g/dL   HCT 31.0 (L) 36.0 - 46.0 %   MCV 91.4 78.0 - 100.0 fL   MCH 32.2 26.0 - 34.0 pg   MCHC 35.2 30.0 - 36.0 g/dL   RDW 12.5 11.5 - 15.5 %   Platelets 190 150 - 400 K/uL  Creatinine, serum     Status: Abnormal   Collection Time: 02/09/14  2:51 PM  Result Value Ref Range   Creatinine, Ser 0.82 0.50 - 1.10 mg/dL   GFR calc non Af Amer 66 (L) >90 mL/min   GFR calc Af Amer 77 (L) >90 mL/min    Comment: (NOTE) The eGFR has been calculated using the CKD EPI equation. This  calculation has not been validated in all clinical situations. eGFR's persistently <90 mL/min signify possible Chronic Kidney Disease.   CBC WITH DIFFERENTIAL     Status: Abnormal   Collection Time: 02/10/14  5:00 AM  Result Value Ref Range   WBC 9.8 4.0 - 10.5 K/uL   RBC 3.29 (L) 3.87 - 5.11 MIL/uL   Hemoglobin 10.6 (L) 12.0 - 15.0 g/dL   HCT 29.8 (L) 36.0 - 46.0 %   MCV 90.6 78.0 - 100.0 fL   MCH 32.2 26.0 - 34.0 pg   MCHC 35.6 30.0 - 36.0 g/dL   RDW 12.2 11.5 - 15.5 %   Platelets 196 150 - 400 K/uL   Neutrophils Relative % 73 43 - 77 %   Neutro Abs 7.2 1.7 - 7.7 K/uL   Lymphocytes Relative 16 12 - 46 %   Lymphs Abs 1.5 0.7 - 4.0 K/uL   Monocytes Relative 9 3 - 12 %   Monocytes Absolute 0.9 0.1 - 1.0 K/uL   Eosinophils Relative 2 0 - 5 %   Eosinophils Absolute 0.2 0.0 - 0.7 K/uL   Basophils Relative 0 0 - 1 %   Basophils Absolute 0.0 0.0 - 0.1 K/uL  Comprehensive metabolic panel     Status: Abnormal   Collection Time: 02/10/14  5:00 AM  Result Value Ref Range   Sodium 137 135 - 145 mmol/L   Potassium 2.7 (LL) 3.5 - 5.1 mmol/L  Comment: REPEATED TO VERIFY CRITICAL RESULT CALLED TO, READ BACK BY AND VERIFIED WITH: EURILLO K,RN 02/10/14 0626 WAYK    Chloride 101 96 - 112 mmol/L   CO2 30 19 - 32 mmol/L   Glucose, Bld 101 (H) 70 - 99 mg/dL   BUN 10 6 - 23 mg/dL   Creatinine, Ser 0.70 0.50 - 1.10 mg/dL   Calcium 8.7 8.4 - 10.5 mg/dL   Total Protein 5.2 (L) 6.0 - 8.3 g/dL   Albumin 2.8 (L) 3.5 - 5.2 g/dL   AST 38 (H) 0 - 37 U/L   ALT 44 (H) 0 - 35 U/L   Alkaline Phosphatase 103 39 - 117 U/L   Total Bilirubin 0.6 0.3 - 1.2 mg/dL   GFR calc non Af Amer 80 (L) >90 mL/min   GFR calc Af Amer >90 >90 mL/min    Comment: (NOTE) The eGFR has been calculated using the CKD EPI equation. This calculation has not been validated in all clinical situations. eGFR's persistently <90 mL/min signify possible Chronic Kidney Disease.    Anion gap 6 5 - 15     HEENT: normal Cardio:  RRR and no murmur Resp: CTA B/L and unlabored GI: BS positive and NT, ND Extremity:  Pulses positive and No Edema Skin:   Intact, central line R IJ intact Neuro: Confused, Cranial Nerve II-XII normal, Normal Sensory, Normal Motor and Abnormal FMC-LUE Ataxic/ dec FMC Musc/Skel:  Normal GEN NAD   Assessment/Plan: 1. 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 Physiatrist is providing close team supervision and 24 hour management of active medical problems listed below. Physiatrist and rehab team continue to assess barriers to discharge/monitor patient progress toward functional and medical goals. FIM:                   Comprehension Comprehension Mode: Auditory Comprehension: 5-Understands basic 90% of the time/requires cueing < 10% of the time  Expression Expression Mode: Verbal Expression: 5-Expresses basic 90% of the time/requires cueing < 10% of the time.  Social Interaction Social Interaction: 5-Interacts appropriately 90% of the time - Needs monitoring or encouragement for participation or interaction.  Problem Solving Problem Solving: 4-Solves basic 75 - 89% of the time/requires cueing 10 - 24% of the time  Memory Memory: 4-Recognizes or recalls 75 - 89% of the time/requires cueing 10 - 24% of the time  Medical Problem List and Plan: 1. 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  DVT Prophylaxis/Anticoagulation: Subcutaneous Lovenox. Monitor platelet counts in any signs of bleeding 3. Pain Management/back pain due to osteoporosis: Percocet 1 tablet every 6 hours as needed as prior to admission. Monitor mental status 4. Mood/dementia: Discussed baseline cognition with family 5. Neuropsych: This patient is capable of making decisions on her own behalf. 6. Skin/Wound Care: Routine skin checks 7.  Fluids/Electrolytes/Nutrition/hypokalemia: Strict I know follow-up chemistries 8. Hypertension. Norvasc 5 mg daily, Avapro 300 mg daily.Monitor with increased mobility 9. Hyperlipidemia. Lipitor 10. Refractory nausea/dizziness. PRN antivert, scheduled scopolamine patch. Vestibular evaluation to be completed 11. Dysphagia. Dysphagia 2 thin liquid diet. Follow speech therapy 12.  HypoK will replete po if poss, doesn't tolerate "big pills", trial liquid LOS (Days) 1 A FACE TO FACE EVALUATION WAS PERFORMED  KIRSTEINS,ANDREW E 02/10/2014, 7:24 AM

## 2014-02-10 NOTE — Evaluation (Signed)
Occupational Therapy Assessment and Plan  Patient Details  Name: [name] MRN: [id number] Date of Birth: 07-17-1933  OT Diagnosis: abnormal posture, ataxia, cognitive deficits and muscle weakness (generalized) Rehab Potential: Rehab Potential (ACUTE ONLY): Good ELOS: 16-18 days   Today's Date: 02/10/2014 OT Individual Time: 1941-7408 OT Individual Time Calculation (min): 60 min     Problem List:  Patient Active Problem List   Diagnosis Date Noted  . Pica 02/09/2014  . 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 Malnutrition of moderate degree 02/06/2014  . Obstructive hydrocephalus   . Malnutrition   . Hypokalemia   . Occlusion of posterior inferior cerebellar artery with infarction   . Cerebellar stroke   . Cytotoxic cerebral edema 02/01/2014  . Cerebellar stroke, acute   . Nausea without vomiting   . Acute CVA (cerebrovascular accident) 01/31/2014  . Essential hypertension 01/31/2014  . Compression fracture of thoracic spine, non-traumatic 01/19/2014  . Hyperlipemia   . Dementia   . Cerebral hemorrhage, nontraumatic   . CVA (cerebral infarction)   . Ureterocele, acquired     Past Medical History:  Past Medical History  Diagnosis Date  . Hypertension   . Hyperlipemia   . Dementia   . Cerebral hemorrhage, nontraumatic 2011  . CVA (cerebral infarction) 2011    left cerebellum  . Ureterocele, acquired    Past Surgical History:  Past Surgical History  Procedure Laterality Date  . Abdominal hysterectomy    . Cholecystectomy      Assessment & Plan Clinical Impression: Patient is a 81 y.o. right handed female with history of hypertension, dementia, osteoporosis, nontraumatic cerebral hemorrhage 2011.Marland Kitchen Patient lives with family used a rolling walker prior to admission. Presented 01/31/2014 with nausea vomiting as well as dizziness and unsteady gait. MRI showed acute left posterior inferior cerebellar artery territory infarct with  petechial hemorrhage. MRA with mid stenosis right P2 segment with luminal irregularity of the bilat posterior cerebral arteries consistent with atherosclerosis. Echocardiogram with ejection fraction 14% grade 1 diastolic dysfunction. Carotid Doppler no ICA stenosis. Patient did not receive TPA. CT angiogram head and neck 02/03/2014 shows acute infarct left inferior cerebellum in PICA territory with mass effect on the fourth ventricle. No obstructive hydrocephalus or hemorrhage. Bilateral carotid atherosclerotic disease noted. Moderately severe stenosis proximal right external carotid artery. 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 Follow up CT Head 02/07/2014 with stable appearance, ventriculomegaly slightly improved. Neurology consulted presently on Plavix for CVA prophylaxis with the addition of Lovenox for DVT prophylaxis. Bouts of hypokalemia resolved with potassium supplement. Intermittent bouts of refractory nausea/dizziness and no response with Thorazine or Phenergan. Latest CT abdomen and pelvis unremarkable. Tolerating a dysphagia 2 thin liquid diet. Physical and occupational therapy evaluations completed 02/01/2014 recommendations of physical medicine rehabilitation consult.   Patient transferred to CIR on 02/09/2014 .    Patient currently requires max with basic self-care skills secondary to muscle weakness, ataxia and decreased coordination, decreased attention, decreased awareness and decreased memory, vestibular and decreased sitting balance and decreased standing balance.  Prior to hospitalization, patient could complete ADLs with assistance due to back injury.  Prior to back injury, pt Independent with all ADLs and mobility.  Patient will benefit from skilled intervention to increase independence with basic self-care skills prior to discharge home with care partner.   Anticipate patient will require 24 hour supervision and follow  up home health.  OT - End of Session Activity Tolerance: Tolerates 30+ min activity with multiple rests Endurance Deficit: Yes Endurance Deficit Description: lethargic OT Assessment Rehab Potential (ACUTE ONLY): Good OT Patient demonstrates impairments in the following area(s): Balance;Cognition;Endurance;Motor;Pain;Safety;Vision OT Basic ADL's Functional Problem(s): Grooming;Bathing;Dressing;Toileting OT Transfers Functional Problem(s): Toilet;Tub/Shower OT Plan OT Intensity: Minimum of 1-2 x/day, 45 to 90 minutes OT Frequency: 5 out of 7 days OT Duration/Estimated Length of Stay: 16-18 days OT Treatment/Interventions: Balance/vestibular training;Cognitive remediation/compensation;Community reintegration;Discharge planning;Disease mangement/prevention;DME/adaptive equipment instruction;Functional mobility training;Neuromuscular re-education;Pain management;Patient/family education;Psychosocial support;Self Care/advanced ADL retraining;Therapeutic Activities;Therapeutic Exercise;UE/LE Coordination activities;Visual/perceptual remediation/compensation OT Self Feeding Anticipated Outcome(s): Supervision OT Basic Self-Care Anticipated Outcome(s): Supervision OT Toileting Anticipated Outcome(s): Supervision OT Bathroom Transfers Anticipated Outcome(s): Supervision OT Recommendation Recommendations for Other Services: Neuropsych consult Patient destination: Home Follow Up Recommendations: Home health OT;24 hour supervision/assistance Equipment Recommended: To be determined Equipment Details: Pt's daughter reports they have a w/c, RW, and shower seat   Skilled Therapeutic Intervention OT eval completed with discussion of OT purpose and possible goals.  ADL assessment completed at EOB and bed level secondary to fatigue and resistance to movement.  Pt reports "frustrated" upon arrival with c/o having to take medications and wanting  to go home.  Pt's daughter present and encouraging pt to participate in therapy session.  UB bathing and dressing completed seated EOB with min assist to maintain midline sitting balance with pt leaning to Rt in unsupported sitting.  Pt with increased fatigue and returning to supine in bed without warning, therefore LB bathing and dressing total assist at bed level with pt able to bridge to assist in pulling pants over hips.  Max encouragement and max assist with lifting and lowering to complete squat pivot transfer bed > w/c.  Pt with no c/o nausea or dizziness throughout session, just fatigue.  Max cues to keep eyes open during tasks.  OT Evaluation Precautions/Restrictions  Precautions Precautions: Fall Restrictions Weight Bearing Restrictions: No General   Vital Signs Therapy Vitals Resp: 16 Patient Position (if appropriate): Orthostatic Vitals Oxygen Therapy SpO2: 97 % O2 Device: Not Delivered Pain Pain Assessment Pain Assessment: No/denies pain Home Living/Prior Functioning Home Living Living Arrangements: Children Available Help at Discharge: Family Type of Home: House Home Access: Stairs to enter [employer] of Steps: 2 steps to back porch with no rails Entrance Stairs-Rails: None Home Layout: Able to live on main level with bedroom/bathroom Additional Comments: has tub bench  Lives With: Daughter (had moved in with her daughter after a recent back injury;  prior to that pt was living independently with her husband ) Prior Function Level of Independence: Requires assistive device for independence, Needs assistance with ADLs (prior to back injury pt was independent with all mobility and self-care tasks) ADL  See FIM Vision/Perception  Vision- Assessment Additional Comments: To be further assessed, pt unable to keep eyes open due to combination of lethargy and fear of dizziness/nausea.  Pt reports she feels better with her eyes closed.  Cognition Overall  Cognitive Status: History of cognitive impairments - at baseline Orientation Level: Oriented to person;Other (comment);Disoriented to person;Oriented to situation;Disoriented to time (For place, pt looked at badge and stated "somewhere over close to [facility]") Attention: Selective Selective Attention: Impaired Selective Attention Impairment: Verbal complex;Functional complex Memory: Impaired Memory Impairment: Decreased recall of new information;Decreased short term memory Decreased Short Term Memory: Verbal complex Awareness: Impaired Awareness Impairment: Emergent impairment;Intellectual impairment Safety/Judgment: Appears intact Sensation Sensation Light Touch: Appears Intact Stereognosis: Not tested Hot/Cold: Not  tested Proprioception: Appears Intact Coordination Finger Nose Finger Test: slight deviation and "shakiness" with Lt hand, slow movements BUE Extremity/Trunk Assessment RUE Assessment RUE Assessment: Within Functional Limits LUE Assessment LUE Assessment: Within Functional Limits  FIM:  FIM - Eating Eating Activity: 5: Set-up assist for open containers;5: Needs verbal cues/supervision FIM - Grooming Grooming Steps: Wash, rinse, dry face;Wash, rinse, dry hands Grooming: 3: Patient completes 2 of 4 or 3 of 5 steps FIM - Bathing Bathing Steps Patient Completed: Chest;Right Arm;Left Arm;Abdomen Bathing: 2: Max-Patient completes 3-4 60f10 parts or 25-49% FIM - Upper Body Dressing/Undressing Upper body dressing/undressing steps patient completed: Thread/unthread right bra strap;Thread/unthread left bra strap;Thread/unthread left sleeve of front closure shirt/dress;Thread/unthread right sleeve of front closure shirt/dress Upper body dressing/undressing: 3: Mod-Patient completed 50-74% of tasks FIM - Lower Body Dressing/Undressing Lower body dressing/undressing: 1: Total-Patient completed less than 25% of tasks FIM - TMusicianDevices: Grab bar or  rail for support Toileting: 1: Total-Patient completed zero steps, helper did all 3 FIM - Bed/Chair Transfer Bed/Chair Transfer: 3: Supine > Sit: Mod A (lifting assist/Pt. 50-74%/lift 2 legs;2: Bed > Chair or W/C: Max A (lift and lower assist)   Refer to Care Plan for Long Term Goals  Recommendations for other services: Neuropsych  Discharge Criteria: Patient will be discharged from OT if patient refuses treatment 3 consecutive times without medical reason, if treatment goals not met, if there is a change in medical status, if patient makes no progress towards goals or if patient is discharged from hospital.  The above assessment, treatment plan, treatment alternatives and goals were discussed and mutually agreed upon: by patient and by family  Fawzi Melman, SR[facility]2/04/2014, 1:28 PM

## 2014-02-10 NOTE — Care Management Note (Signed)
[facility] Individual Statement of Services  Patient Name:  [name]  Date:  02/10/2014  Welcome to the [facility].  Our goal is to provide you with an individualized program based on your diagnosis and situation, designed to meet your specific needs.  With this comprehensive rehabilitation program, you will be expected to participate in at least 3 hours of rehabilitation therapies Monday-Friday, with modified therapy programming on the weekends.  Your rehabilitation program will include the following services:  Physical Therapy (PT), Occupational Therapy (OT), Speech Therapy (ST), 24 hour per day rehabilitation nursing, Case Management (Social Worker), Rehabilitation Medicine, Nutrition Services and Pharmacy Services  Weekly team conferences will be held on Wednesday to discuss your progress.  Your Social Worker will talk with you frequently to get your input and to update you on team discussions.  Team conferences with you and your family in attendance may also be held.  Expected length of stay: 16-18 days  Overall anticipated outcome: supervision with cueing  Depending on your progress and recovery, your program may change. Your Social Worker will coordinate services and will keep you informed of any changes. Your Social Worker's name and contact numbers are listed  below.  The following services may also be recommended but are not provided by the [facility] will be made to provide these services after discharge if needed.  Arrangements include referral to agencies that provide these services.  Your insurance has been verified to be:  [employer] Your primary doctor is:  Briscoe Deutscher  Pertinent information will be shared with your doctor and your insurance company.  Social Worker:  Ovidio Kin, [place] or ([phone]  Information discussed with and copy given to patient by: Elease Hashimoto, 02/10/2014, 10:49 AM

## 2014-02-10 NOTE — Progress Notes (Signed)
CRITICAL VALUE ALERT  Critical value received:  2.7 Potassium   Date of notification:  02/10/2014  Time of notification:  0627  Critical value read back:Yes.    Nurse who received alert:  Landis Martins   MD notified (1st page):  Silvestre Mesi, PA   Time of first page:  0627  MD notified (2nd page):  Time of second page:  Responding MD:  Silvestre Mesi, [place]   Time MD responded: [phone]

## 2014-02-11 ENCOUNTER — Encounter (HOSPITAL_COMMUNITY): Payer: Medicare Other | Admitting: Occupational Therapy

## 2014-02-11 ENCOUNTER — Inpatient Hospital Stay (HOSPITAL_COMMUNITY): Payer: Medicare Other | Admitting: Occupational Therapy

## 2014-02-11 ENCOUNTER — Inpatient Hospital Stay (HOSPITAL_COMMUNITY): Payer: Medicare Other | Admitting: Speech Pathology

## 2014-02-11 ENCOUNTER — Inpatient Hospital Stay (HOSPITAL_COMMUNITY): Payer: Medicare Other | Admitting: Physical Therapy

## 2014-02-11 DIAGNOSIS — I69391 Dysphagia following cerebral infarction: Secondary | ICD-10-CM

## 2014-02-11 LAB — URINALYSIS, ROUTINE W REFLEX MICROSCOPIC
Bilirubin Urine: NEGATIVE
GLUCOSE, UA: NEGATIVE mg/dL
KETONES UR: NEGATIVE mg/dL
Nitrite: POSITIVE — AB
Protein, ur: 30 mg/dL — AB
Specific Gravity, Urine: 1.012 (ref 1.005–1.030)
Urobilinogen, UA: 1 mg/dL (ref 0.0–1.0)
pH: 6.5 (ref 5.0–8.0)

## 2014-02-11 LAB — BASIC METABOLIC PANEL
Anion gap: 5 (ref 5–15)
BUN: 13 mg/dL (ref 6–23)
CO2: 30 mmol/L (ref 19–32)
Calcium: 8.5 mg/dL (ref 8.4–10.5)
Chloride: 100 mmol/L (ref 96–112)
Creatinine, Ser: 0.86 mg/dL (ref 0.50–1.10)
GFR calc Af Amer: 73 mL/min — ABNORMAL LOW (ref >90)
GFR calc non Af Amer: 63 mL/min — ABNORMAL LOW (ref >90)
Glucose, Bld: 93 mg/dL (ref 70–99)
POTASSIUM: 3.1 mmol/L — AB (ref 3.5–5.1)
SODIUM: 135 mmol/L (ref 135–145)

## 2014-02-11 LAB — URINE MICROSCOPIC-ADD ON

## 2014-02-11 MED ORDER — SODIUM CHLORIDE 0.9 % IJ SOLN
10.0000 mL | INTRAMUSCULAR | Status: DC | PRN
  Administered 2014-02-11: 40 mL
  Administered 2014-02-11: 10 mL
  Administered 2014-02-13: 30 mL
  Administered 2014-02-14: 10 mL
  Filled 2014-02-11 (×3): qty 40

## 2014-02-11 MED ORDER — MIRTAZAPINE 7.5 MG PO TABS
7.5000 mg | ORAL_TABLET | Freq: Every day | ORAL | Status: DC
  Administered 2014-02-11 – 2014-02-22 (×11): 7.5 mg via ORAL
  Filled 2014-02-11 (×13): qty 1

## 2014-02-11 NOTE — Progress Notes (Signed)
Occupational Therapy Session Note  Patient Details  Name: [name] MRN: [id number] Date of Birth: [birth year]  Today's Date: 02/11/2014 OT Individual Time: 7867-5449 and 1335-1405 OT Individual Time Calculation (min): 60 min and 30 min   Short Term Goals: Week 1:  OT Short Term Goal 1 (Week 1): Pt will complete bathing at sit > stand level with min assist OT Short Term Goal 2 (Week 1): Pt will complete LB dressing at sit > stand level with mod assist OT Short Term Goal 3 (Week 1): Pt will complete UB dressing with min assist OT Short Term Goal 4 (Week 1): Pt will complete toilet transfer with min assist OT Short Term Goal 5 (Week 1): Pt will complete 2 grooming tasks in standing with min assist  Skilled Therapeutic Interventions/Progress Updates:   1) Engaged in ADL retraining with focus on transfer, sit > stand, standing balance, and increased participation in self-care tasks.  Pt's son and daughter present upon arrival speaking with MD, both left for work at beginning of session.  Max assist supine to sit with manual facilitation for weight shift to EOB.  Pt tends to lose balance posterior and to Rt in sitting and with standing, requiring multimodal cues to promote midline sitting and standing balance.  Stand pivot transfer bed > w/c with max assist with cues at hips to promote forward weight shift.  Bathing and dressing completed at sit > stand level at sink with focus on increased sequencing and participation in self-care tasks. Pt reported need to toilet, stand step transfer w/c > BSC with max assist again multimodal cues for midline and trunk control with transfer.  Pt with no c/o dizziness or nausea, required increased time and rest breaks throughout session.   2) Pt received seated up in w/c, reporting need to toilet.  Stand pivot transfer w/c > toilet in bathroom with mod assist to toilet and max assist when returning secondary to low toilet height.  Pt able to complete clothing  management and hygiene this session with assist to maintain upright standing balance.  Engaged in standing task outside parallel bars with mirror to promote upright standing as pt tends to stand in flexed posture with weight posterior and to Rt.  Pt with improved midline standing with visual feedback.  Performed sidestepping to maintain upright stance during transitional movement, with pt requiring increased physical assist to maintain alignment.  Therapy Documentation Precautions:  Precautions Precautions: Fall Restrictions Weight Bearing Restrictions: No General:   Vital Signs: Therapy Vitals Temp: 98.3 F (36.8 C) Temp Source: Oral Pulse Rate: 61 Resp: 17 BP: 132/67 mmHg Patient Position (if appropriate): Orthostatic Vitals Oxygen Therapy SpO2: 97 % O2 Device: Not Delivered Pain:  Pt with no c/o pain  See FIM for current functional status  Therapy/Group: Individual Therapy  Simonne Come 02/11/2014, 8:52 AM

## 2014-02-11 NOTE — IPOC Note (Signed)
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     Functional Problem List: Nursing Bladder, Bowel, Safety, Endurance, Medication Management, Nutrition  PT Balance, Endurance, Motor, Perception, Safety, Sensory  OT Balance, Cognition, Endurance, Motor, Pain, Safety, Vision  SLP Cognition, Nutrition  TR         Basic ADL's: OT Grooming, Bathing, Dressing, Toileting     Advanced  ADL's: OT       Transfers: PT Bed Mobility, Bed to Chair, Car, Furniture, Futures trader, Metallurgist: PT Ambulation, Emergency planning/management officer, Stairs     Additional Impairments: OT    SLP Swallowing, Firefighter, Attention, Awareness, Problem Solving  TR      Anticipated Outcomes Item Anticipated Outcome  Self Feeding Supervision  Swallowing  Mod I with least restrictive diet    Basic self-care  Supervision  Toileting  Supervision   Bathroom Transfers Supervision  Bowel/Bladder  Cont of bowel with mod I and bladder with level 5 assist  Transfers  Supervision  Locomotion  Ambulatory with supervision to Min A  Communication     Cognition  Supervision   Pain  n/a  Safety/Judgment  maintain safety with cues and Supervision   Therapy Plan: PT Intensity: Minimum of 1-2 x/day ,45 to 90 minutes PT Frequency: 5 out of 7 days PT Duration Estimated Length of Stay: 16-18 days OT Intensity: Minimum of 1-2 x/day, 45 to 90 minutes OT Frequency: 5 out of 7 days OT Duration/Estimated Length of Stay: 16-18 days SLP Intensity: Minumum of 1-2 x/day, 30 to 90 minutes SLP Frequency: 5 out of 7 days SLP Duration/Estimated Length of Stay: 7-10 days        Team Interventions: Nursing Interventions Patient/Family Education, Dysphagia/Aspiration Precaution Training, Bladder  Management, Medication Management, Psychosocial Support, Bowel Management, Discharge Planning, Disease Management/Prevention, Cognitive Remediation/Compensation  PT interventions Ambulation/gait training, Balance/vestibular training, Cognitive remediation/compensation, Disease management/prevention, Discharge planning, DME/adaptive equipment instruction, Functional mobility training, Patient/family education, Therapeutic Exercise, Visual/perceptual remediation/compensation, UE/LE Coordination activities, Therapeutic Activities, Neuromuscular re-education, Stair training, UE/LE Strength taining/ROM, Wheelchair propulsion/positioning  OT Interventions Training and development officer, Cognitive remediation/compensation, Community reintegration, Discharge planning, Disease mangement/prevention, DME/adaptive equipment instruction, Functional mobility training, Neuromuscular re-education, Pain management, Patient/family education, Psychosocial support, Self Care/advanced ADL retraining, Therapeutic Activities, Therapeutic Exercise, UE/LE Coordination activities, Visual/perceptual remediation/compensation  SLP Interventions Dysphagia/aspiration precaution training, Patient/family education, Functional tasks, Internal/external aids, Environmental controls, Cueing hierarchy, Cognitive remediation/compensation  TR Interventions    SW/CM Interventions Discharge Planning, Psychosocial Support, Patient/Family Education    Team Discharge Planning: Destination: PT-  ,OT- Home , SLP-Home Projected Follow-up: PT- , OT-  Home health OT, 24 hour supervision/assistance, SLP-24 hour supervision/assistance, Other (comment) (SLP follow up TBD) Projected Equipment Needs: PT- , OT- To be determined, SLP-None recommended by SLP Equipment Details: PT- , OT-Pt's daughter reports they have a w/c, RW, and shower seat Patient/family involved in discharge planning: PT- Patient,  OT-Patient, Family member/caregiver, SLP-Patient, Family  member/caregiver   MD ELOS: 16-18 days Medical Rehab Prognosis:  Excellent Assessment: The patient has been admitted for CIR therapies with the diagnosis of left cerebellar infarct. The team will be addressing functional mobility, strength, stamina, balance, safety, adaptive techniques and equipment, self-care, bowel and bladder mgt, patient and caregiver education, vestibular assessment and treat, symptom mgt, ego support, community reintegration, leisure  awareness. Goals have been set at supervision for mobility, self-care and cognition.    Meredith Staggers, MD, FAAPMR      See Team Conference Notes for weekly updates to the plan of care

## 2014-02-11 NOTE — Progress Notes (Signed)
Subjective/Complaints: Multiple questions from son and daughter , concerns about appetite even prior to stroke Discussed K level and supplementation Pt with baseline depression per daughter Objective: Vital Signs: Blood pressure 127/60, pulse 85, temperature 98.1 F (36.7 C), temperature source Oral, resp. rate 17, height 5' 4"  (1.626 m), weight 60.5 kg (133 lb 6.1 oz), SpO2 98 %. No results found. Results for orders placed or performed during the hospital encounter of 02/09/14 (from the past 72 hour(s))  CBC     Status: Abnormal   Collection Time: 02/09/14  2:51 PM  Result Value Ref Range   WBC 10.3 4.0 - 10.5 K/uL   RBC 3.39 (L) 3.87 - 5.11 MIL/uL   Hemoglobin 10.9 (L) 12.0 - 15.0 g/dL   HCT 31.0 (L) 36.0 - 46.0 %   MCV 91.4 78.0 - 100.0 fL   MCH 32.2 26.0 - 34.0 pg   MCHC 35.2 30.0 - 36.0 g/dL   RDW 12.5 11.5 - 15.5 %   Platelets 190 150 - 400 K/uL  Creatinine, serum     Status: Abnormal   Collection Time: 02/09/14  2:51 PM  Result Value Ref Range   Creatinine, Ser 0.82 0.50 - 1.10 mg/dL   GFR calc non Af Amer 66 (L) >90 mL/min   GFR calc Af Amer 77 (L) >90 mL/min    Comment: (NOTE) The eGFR has been calculated using the CKD EPI equation. This calculation has not been validated in all clinical situations. eGFR's persistently <90 mL/min signify possible Chronic Kidney Disease.   CBC WITH DIFFERENTIAL     Status: Abnormal   Collection Time: 02/10/14  5:00 AM  Result Value Ref Range   WBC 9.8 4.0 - 10.5 K/uL   RBC 3.29 (L) 3.87 - 5.11 MIL/uL   Hemoglobin 10.6 (L) 12.0 - 15.0 g/dL   HCT 29.8 (L) 36.0 - 46.0 %   MCV 90.6 78.0 - 100.0 fL   MCH 32.2 26.0 - 34.0 pg   MCHC 35.6 30.0 - 36.0 g/dL   RDW 12.2 11.5 - 15.5 %   Platelets 196 150 - 400 K/uL   Neutrophils Relative % 73 43 - 77 %   Neutro Abs 7.2 1.7 - 7.7 K/uL   Lymphocytes Relative 16 12 - 46 %   Lymphs Abs 1.5 0.7 - 4.0 K/uL   Monocytes Relative 9 3 - 12 %   Monocytes Absolute 0.9 0.1 - 1.0 K/uL    Eosinophils Relative 2 0 - 5 %   Eosinophils Absolute 0.2 0.0 - 0.7 K/uL   Basophils Relative 0 0 - 1 %   Basophils Absolute 0.0 0.0 - 0.1 K/uL  Comprehensive metabolic panel     Status: Abnormal   Collection Time: 02/10/14  5:00 AM  Result Value Ref Range   Sodium 137 135 - 145 mmol/L   Potassium 2.7 (LL) 3.5 - 5.1 mmol/L    Comment: REPEATED TO VERIFY CRITICAL RESULT CALLED TO, READ BACK BY AND VERIFIED WITH: EURILLO K,RN 02/10/14 0626 WAYK    Chloride 101 96 - 112 mmol/L   CO2 30 19 - 32 mmol/L   Glucose, Bld 101 (H) 70 - 99 mg/dL   BUN 10 6 - 23 mg/dL   Creatinine, Ser 0.70 0.50 - 1.10 mg/dL   Calcium 8.7 8.4 - 10.5 mg/dL   Total Protein 5.2 (L) 6.0 - 8.3 g/dL   Albumin 2.8 (L) 3.5 - 5.2 g/dL   AST 38 (H) 0 - 37 U/L   ALT 44 (  H) 0 - 35 U/L   Alkaline Phosphatase 103 39 - 117 U/L   Total Bilirubin 0.6 0.3 - 1.2 mg/dL   GFR calc non Af Amer 80 (L) >90 mL/min   GFR calc Af Amer >90 >90 mL/min    Comment: (NOTE) The eGFR has been calculated using the CKD EPI equation. This calculation has not been validated in all clinical situations. eGFR's persistently <90 mL/min signify possible Chronic Kidney Disease.    Anion gap 6 5 - 15  Basic metabolic panel     Status: Abnormal   Collection Time: 02/11/14  5:55 AM  Result Value Ref Range   Sodium 135 135 - 145 mmol/L   Potassium 3.1 (L) 3.5 - 5.1 mmol/L   Chloride 100 96 - 112 mmol/L   CO2 30 19 - 32 mmol/L   Glucose, Bld 93 70 - 99 mg/dL   BUN 13 6 - 23 mg/dL   Creatinine, Ser 0.86 0.50 - 1.10 mg/dL   Calcium 8.5 8.4 - 10.5 mg/dL   GFR calc non Af Amer 63 (L) >90 mL/min   GFR calc Af Amer 73 (L) >90 mL/min    Comment: (NOTE) The eGFR has been calculated using the CKD EPI equation. This calculation has not been validated in all clinical situations. eGFR's persistently <90 mL/min signify possible Chronic Kidney Disease.    Anion gap 5 5 - 15     HEENT: normal Cardio: RRR and no murmur Resp: CTA B/L and unlabored GI:  BS positive and NT, ND Extremity:  Pulses positive and No Edema Skin:   Intact, central line R IJ intact Neuro: Confused, Cranial Nerve II-XII normal, Normal Sensory, Normal Motor and Abnormal FMC-LUE Ataxic/ dec FMC Musc/Skel:  Normal GEN NAD   Assessment/Plan: 1. 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 Physiatrist is providing close team supervision and 24 hour management of active medical problems listed below. Physiatrist and rehab team continue to assess barriers to discharge/monitor patient progress toward functional and medical goals. FIM: FIM - Bathing Bathing Steps Patient Completed: Chest, Right Arm, Left Arm, Abdomen Bathing: 2: Max-Patient completes 3-4 32f10 parts or 25-49%  FIM - Upper Body Dressing/Undressing Upper body dressing/undressing steps patient completed: Thread/unthread right bra strap, Thread/unthread left bra strap, Thread/unthread left sleeve of front closure shirt/dress, Thread/unthread right sleeve of front closure shirt/dress Upper body dressing/undressing: 3: Mod-Patient completed 50-74% of tasks FIM - Lower Body Dressing/Undressing Lower body dressing/undressing: 1: Total-Patient completed less than 25% of tasks  FIM - TMusicianDevices: Grab bar or rail for support Toileting: 1: Total-Patient completed zero steps, helper did all 3     FIM - Bed/Chair Transfer Bed/Chair Transfer: 1: Supine > Sit: Total A (helper does all/Pt. < 25%), 3: Sit > Supine: Mod A (lifting assist/Pt. 50-74%/lift 2 legs), 2: Bed > Chair or W/C: Max A (lift and lower assist), 3: Chair or W/C > Bed: Mod A (lift or lower assist)  FIM - Locomotion: Wheelchair Distance: 40 Locomotion: Wheelchair: 1: Travels less than 50 ft with minimal assistance (Pt.>75%) FIM - Locomotion: Ambulation Locomotion: Ambulation Assistive Devices: Other (comment) (L  then R hallway hand rail) Ambulation/Gait Assistance: 1: +2 Total assist, 2: Max assist, Other (comment) (+2A for w/c follow) Locomotion: Ambulation: 1: Two helpers  Comprehension Comprehension Mode: Auditory Comprehension: 5-Understands basic 90% of the time/requires cueing < 10% of the time  Expression Expression Mode: Verbal  Expression: 5-Expresses basic needs/ideas: With extra time/assistive device  Social Interaction Social Interaction: 5-Interacts appropriately 90% of the time - Needs monitoring or encouragement for participation or interaction.  Problem Solving Problem Solving: 5-Solves basic 90% of the time/requires cueing < 10% of the time  Memory Memory: 5-Recognizes or recalls 90% of the time/requires cueing < 10% of the time  Medical Problem List and Plan: 1. 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  DVT Prophylaxis/Anticoagulation: Subcutaneous Lovenox. Monitor platelet counts in any signs of bleeding 3. Pain Management/back pain due to osteoporosis: Percocet 1 tablet every 6 hours as needed as prior to admission. Monitor mental status 4. Mood/dementia: Discussed baseline cognition with family, may have depression as well, neuropsych eval 5. Neuropsych: This patient is capable of making decisions on her own behalf. 6. Skin/Wound Care: Routine skin checks 7. Fluids/Electrolytes/Nutrition/hypokalemia: Strict I know follow-up chemistries 8. Hypertension. Norvasc 5 mg daily, Avapro 300 mg daily.Monitor with increased mobility 9. Hyperlipidemia. Lipitor 10. Refractory nausea/dizziness. PRN antivert, scheduled scopolamine patch. Vestibular evaluation to be completed 11. Dysphagia. Dysphagia 3 thin liquid diet. Follow speech therapy 12.  HypoK will replete po if poss, doesn't tolerate "big pills", trial liquid LOS (Days) 2 A FACE TO FACE EVALUATION WAS PERFORMED  Lennyn Bellanca E 02/11/2014, 7:22 AM

## 2014-02-11 NOTE — Evaluation (Addendum)
Physical Therapy Vestibular Assessment and Plan  Patient Details  Name: [name] MRN: [id number] Date of Birth: 08-Mar-1933  PT Diagnosis: Vertigo of central origin  Today's Date: 02/11/2014 PT Individual Time: 1407-1510 PT Individual Time Calculation (min): 63 min    Problem List:  Patient Active Problem List   Diagnosis Date Noted  . Pica 02/09/2014  . 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 Malnutrition of moderate degree 02/06/2014  . Obstructive hydrocephalus   . Malnutrition   . Hypokalemia   . Occlusion of posterior inferior cerebellar artery with infarction   . Cerebellar stroke   . Cytotoxic cerebral edema 02/01/2014  . Cerebellar stroke, acute   . Nausea without vomiting   . Acute CVA (cerebrovascular accident) 01/31/2014  . Essential hypertension 01/31/2014  . Compression fracture of thoracic spine, non-traumatic 01/19/2014  . Hyperlipemia   . Dementia   . Cerebral hemorrhage, nontraumatic   . CVA (cerebral infarction)   . Ureterocele, acquired     Past Medical History:  Past Medical History  Diagnosis Date  . Hypertension   . Hyperlipemia   . Dementia   . Cerebral hemorrhage, nontraumatic 2011  . CVA (cerebral infarction) 2011    left cerebellum  . Ureterocele, acquired    Past Surgical History:  Past Surgical History  Procedure Laterality Date  . Abdominal hysterectomy    . Cholecystectomy    Subjective Pt reports dizziness, nausea, and vomiting since pt sustained CVA. Baseline subjective dizziness rating: 0/10. Per pt, said symptoms increase with head/body movement and are mitigated with time, eating, and by remaining still. Pt denies diplopia at this time but pt did have double vision, per son-in-law Waunita Schooner (present during evaluation), with distant vision in superior visual field during initial 2-3 days of hospital stay.   Evaluation Eye Alignment Eye alignment grossly WNL; head tilted to R side  Spontaneous   Nystagmus None observed  Gaze holding nystagmus (+) Upbeating nystagmus with end range superior gaze. Symptomatic (increased to 2/10 dizziness)  (+) L gaze-evoked nystagmus with L horizontal gaze  Smooth pursuit Difficult to assess secondary to decreased ability to follow multi-step commands. However, noted inconsistent loss of visual target to L of midline, decreased smoothness of movement with horizontal and vertical tracking (all directions).  Oculomotor Convergence WFL  Saccades Abnormal. Hypermetric horizontal saccades to R side; hypometric horizontal saccades to L side.  VOR slow Abnormal. Pt consistently loses target with horizontal head turns to L side; however, difficult to discern abnormal slow VOR from decreased sustained attention.  Head Thrust Test NT  Head Shaking Nystagmus NT  Rt. Hallpike Dix Positional vertigo not formally assessed secondary to no history of recent fall and symptoms not consistent with those of BPPV.  LtMarye Round NT; see above.  Rt. Roll Test NT; see above.  Lt. Roll Test  NT; see above.  Motion sensitivity Increased disequilibrium with vertical > horizontal head turns. Assessment of disequilibrium with functional mobility limited by decreased pt tolerance to testing, increased pain in thoracic spine.   Visual- Vestibular Interactions:  - Sitting: able to utilize visual feedback of mirror to self-correct R lateropulsion.  Findings: Exam findings suggestive of vertigo of central origin. Specifically, pt demonstrates decreased oculomotor coordination, impaired smooth pursuit, gaze-evoked nystagmus, impaired slow VOR with horizontal head turns, and difficulty holding gaze in L superior quadrant. Evaluation limited by decreased sustained attention, difficulty following multi-step commands, and increased thoracic spine pain with unsupported sitting. Pt's subjective dizziness rating  was 3/10 at most during assessment and decreased to 2/10 with use of visual  target.  Plan:  1. Adaptation: expose pt to functional head movement to facilitate nervous system adaptation to loss of vestibular input. Rather than having pt do formal VOR x1 viewing exercise, incorporate head turning into functional mobility/tasks but make sure patient is actually turning head and looking at target (horse shoe, towel, clothes pin, etc). Start in seated and progress to standing then walking, as tolerated. Monitor subjective dizziness rating (0-10) and pause/rest until rating is within 2 of baseline rating. 2. Compensation: Thus far, PT sessions have been limited by thoracic spine pain and fatigue more so than dizziness. If dizziness/nausea begin to limit pt participation in therapy, reinforce compensatory strategies (visual targeting, use of assistive device). When turning, instruct pt to first move eyes to target, followed by head toward target, then body toward target.   Precautions/Restrictions  Fall Pain Pain Assessment Pain Assessment: No/denies pain Faces Pain Scale: Hurts a little bit Pain Location: Back Pain Orientation: Mid Pain Descriptors / Indicators: Aching Pain Onset: With Activity Pain Intervention(s): RN made aware;Other (Comment) (Pt declined medication) HLocomotion  Wheelchair Mobility Distance: 70  FIM:  FIM - Engineer, site Assistive Devices: Arm rests Bed/Chair Transfer: 2: Bed > Chair or W/C: Max A (lift and lower assist);2: Chair or W/C > Bed: Max A (lift and lower assist);4: Sit > Supine: Min A (steadying pt. > 75%/lift 1 leg) FIM - Locomotion: Wheelchair Distance: 70 Locomotion: Wheelchair: 2: Travels 50 - 149 ft with minimal assistance (Pt.>75%)    Discharge Criteria: Patient will be discharged from PT if patient refuses treatment 3 consecutive times without medical reason, if treatment goals not met, if there is a change in medical status, if patient makes no progress towards goals or if patient is discharged from  hospital.  The above assessment, treatment plan, treatment alternatives and goals were discussed and mutually agreed upon: by patient and by family  Hobble, Malva Cogan 02/11/2014, 7:00 PM

## 2014-02-11 NOTE — Progress Notes (Signed)
Patient has very strong smelling, cloudy urine.  Marlowe Shores, PA notified; new orders received.  Will continue to monitor.

## 2014-02-11 NOTE — Progress Notes (Signed)
Speech Language Pathology Daily Session Note  Patient Details  Name: [name] MRN: [id number] Date of Birth: [birth year]  Today's Date: 02/11/2014 SLP Individual Time: 1300-1330 SLP Individual Time Calculation (min): 30 min  Short Term Goals: Week 1: SLP Short Term Goal 1 (Week 1): Pt will tolerate presentations of her currently prescribed diet with no overt s/s of aspiration and supervision cues for use of swallowing precautions.   SLP Short Term Goal 2 (Week 1): Pt will tolerate trials of advanced consistencies with supervision cues to monitor and correct pocketing.   SLP Short Term Goal 3 (Week 1): Pt will improve use of compensatory aids for memory to facilitate improved recall of daily information with min assist.   Skilled Therapeutic Interventions:  Pt was seen for skilled ST targeting dysphagia goals.  Upon arrival, pt was seated upright in wheelchair, awake, alert, and agreeable to participate in ST with min encouragement.  SLP completed skilled observations with pt's currently prescribed diet with no overt s/s of aspiration noted with solid or liquid consistencies.  Pt demonstrated prolonged mastication of dys 3 solids but was able to achieve adequate clearance of residual solids from the oral cavity.  Pt also continues to present with poor PO intake even when environmental distractions were limited to facilitate improved sustained attention during meal.  Continue per current plan of care.     FIM:  Comprehension Comprehension Mode: Auditory Comprehension: 5-Understands basic 90% of the time/requires cueing < 10% of the time Expression Expression Mode: Verbal Expression: 5-Expresses basic needs/ideas: With extra time/assistive device Social Interaction Social Interaction: 5-Interacts appropriately 90% of the time - Needs monitoring or encouragement for participation or interaction. Problem Solving Problem Solving: 4-Solves basic 75 - 89% of the time/requires cueing 10 - 24% of  the time Memory Memory: 4-Recognizes or recalls 75 - 89% of the time/requires cueing 10 - 24% of the time FIM - Eating Eating Activity: 5: Set-up assist for open containers;5: Supervision/cues  Pain Pain Assessment Pain Assessment: No/denies pain  Therapy/Group: Individual Therapy  Machaela Caterino, Selinda Orion 02/11/2014, 3:29 PM

## 2014-02-11 NOTE — Progress Notes (Signed)
Patient's daughter, [name], requests to speak with the patient's neurologist regarding medications.  Marlowe Shores, PA notified and states he will contact neurology to assist the daughter.  Will continue to monitor.

## 2014-02-11 NOTE — Progress Notes (Signed)
Social Work Patient ID: [name], female   DOB: 1933-10-09, 81 y.o.   MRN: [id number] Spoke with daughter via telephone to inform team;s goals supervision with cueing and estimation of length of stay 16-18 days.  She was pleased with and will contact [facility] to see if they can  Provide this amount of care.  Will work toward discharge plan, aware Mom will require 24 hr supervision level at discharge.

## 2014-02-11 NOTE — Progress Notes (Signed)
Per laboratory, patient had an order for a BMET at 0800 but has already had one drawn and resulted at 0555 this morning.  Marlowe Shores, PA states the 1657 order is a duplicate and can be cancelled.  Lab notified of duplication.  Will continue to monitor.

## 2014-02-12 ENCOUNTER — Inpatient Hospital Stay (HOSPITAL_COMMUNITY): Payer: Medicare Other | Admitting: *Deleted

## 2014-02-12 ENCOUNTER — Inpatient Hospital Stay (HOSPITAL_COMMUNITY): Payer: Medicare Other | Admitting: Speech Pathology

## 2014-02-12 ENCOUNTER — Inpatient Hospital Stay (HOSPITAL_COMMUNITY): Payer: Medicare Other | Admitting: Physical Therapy

## 2014-02-12 DIAGNOSIS — R269 Unspecified abnormalities of gait and mobility: Secondary | ICD-10-CM

## 2014-02-12 DIAGNOSIS — I69398 Other sequelae of cerebral infarction: Secondary | ICD-10-CM

## 2014-02-12 LAB — BASIC METABOLIC PANEL
ANION GAP: 7 (ref 5–15)
BUN: 10 mg/dL (ref 6–23)
CO2: 29 mmol/L (ref 19–32)
Calcium: 8.4 mg/dL (ref 8.4–10.5)
Chloride: 98 mmol/L (ref 96–112)
Creatinine, Ser: 0.83 mg/dL (ref 0.50–1.10)
GFR calc Af Amer: 76 mL/min — ABNORMAL LOW (ref >90)
GFR calc non Af Amer: 65 mL/min — ABNORMAL LOW (ref >90)
Glucose, Bld: 98 mg/dL (ref 70–99)
POTASSIUM: 3.2 mmol/L — AB (ref 3.5–5.1)
Sodium: 134 mmol/L — ABNORMAL LOW (ref 135–145)

## 2014-02-12 MED ORDER — CIPROFLOXACIN HCL 250 MG PO TABS
250.0000 mg | ORAL_TABLET | Freq: Two times a day (BID) | ORAL | Status: AC
  Administered 2014-02-12 – 2014-02-16 (×10): 250 mg via ORAL
  Filled 2014-02-12 (×10): qty 1

## 2014-02-12 NOTE — Progress Notes (Signed)
Subjective/Complaints: Remembers me as her MD Oriented to [facility] and "stroke"  Review of Systems - Negative except had 2 BMs today, limited by cognition Objective: Vital Signs: Blood pressure 121/62, pulse 92, temperature 98.8 F (37.1 C), temperature source Oral, resp. rate 18, height 5' 4"  (1.626 m), weight 60.5 kg (133 lb 6.1 oz), SpO2 98 %. No results found. Results for orders placed or performed during the hospital encounter of 02/09/14 (from the past 72 hour(s))  CBC     Status: Abnormal   Collection Time: 02/09/14  2:51 PM  Result Value Ref Range   WBC 10.3 4.0 - 10.5 K/uL   RBC 3.39 (L) 3.87 - 5.11 MIL/uL   Hemoglobin 10.9 (L) 12.0 - 15.0 g/dL   HCT 31.0 (L) 36.0 - 46.0 %   MCV 91.4 78.0 - 100.0 fL   MCH 32.2 26.0 - 34.0 pg   MCHC 35.2 30.0 - 36.0 g/dL   RDW 12.5 11.5 - 15.5 %   Platelets 190 150 - 400 K/uL  Creatinine, serum     Status: Abnormal   Collection Time: 02/09/14  2:51 PM  Result Value Ref Range   Creatinine, Ser 0.82 0.50 - 1.10 mg/dL   GFR calc non Af Amer 66 (L) >90 mL/min   GFR calc Af Amer 77 (L) >90 mL/min    Comment: (NOTE) The eGFR has been calculated using the CKD EPI equation. This calculation has not been validated in all clinical situations. eGFR's persistently <90 mL/min signify possible Chronic Kidney Disease.   CBC WITH DIFFERENTIAL     Status: Abnormal   Collection Time: 02/10/14  5:00 AM  Result Value Ref Range   WBC 9.8 4.0 - 10.5 K/uL   RBC 3.29 (L) 3.87 - 5.11 MIL/uL   Hemoglobin 10.6 (L) 12.0 - 15.0 g/dL   HCT 29.8 (L) 36.0 - 46.0 %   MCV 90.6 78.0 - 100.0 fL   MCH 32.2 26.0 - 34.0 pg   MCHC 35.6 30.0 - 36.0 g/dL   RDW 12.2 11.5 - 15.5 %   Platelets 196 150 - 400 K/uL   Neutrophils Relative % 73 43 - 77 %   Neutro Abs 7.2 1.7 - 7.7 K/uL   Lymphocytes Relative 16 12 - 46 %   Lymphs Abs 1.5 0.7 - 4.0 K/uL   Monocytes Relative 9 3 - 12 %   Monocytes Absolute 0.9 0.1 - 1.0 K/uL   Eosinophils Relative 2 0 - 5 %   Eosinophils  Absolute 0.2 0.0 - 0.7 K/uL   Basophils Relative 0 0 - 1 %   Basophils Absolute 0.0 0.0 - 0.1 K/uL  Comprehensive metabolic panel     Status: Abnormal   Collection Time: 02/10/14  5:00 AM  Result Value Ref Range   Sodium 137 135 - 145 mmol/L   Potassium 2.7 (LL) 3.5 - 5.1 mmol/L    Comment: REPEATED TO VERIFY CRITICAL RESULT CALLED TO, READ BACK BY AND VERIFIED WITH: EURILLO K,RN 02/10/14 0626 WAYK    Chloride 101 96 - 112 mmol/L   CO2 30 19 - 32 mmol/L   Glucose, Bld 101 (H) 70 - 99 mg/dL   BUN 10 6 - 23 mg/dL   Creatinine, Ser 0.70 0.50 - 1.10 mg/dL   Calcium 8.7 8.4 - 10.5 mg/dL   Total Protein 5.2 (L) 6.0 - 8.3 g/dL   Albumin 2.8 (L) 3.5 - 5.2 g/dL   AST 38 (H) 0 - 37 U/L   ALT 44 (H)  0 - 35 U/L   Alkaline Phosphatase 103 39 - 117 U/L   Total Bilirubin 0.6 0.3 - 1.2 mg/dL   GFR calc non Af Amer 80 (L) >90 mL/min   GFR calc Af Amer >90 >90 mL/min    Comment: (NOTE) The eGFR has been calculated using the CKD EPI equation. This calculation has not been validated in all clinical situations. eGFR's persistently <90 mL/min signify possible Chronic Kidney Disease.    Anion gap 6 5 - 15  Basic metabolic panel     Status: Abnormal   Collection Time: 02/11/14  5:55 AM  Result Value Ref Range   Sodium 135 135 - 145 mmol/L   Potassium 3.1 (L) 3.5 - 5.1 mmol/L   Chloride 100 96 - 112 mmol/L   CO2 30 19 - 32 mmol/L   Glucose, Bld 93 70 - 99 mg/dL   BUN 13 6 - 23 mg/dL   Creatinine, Ser 0.86 0.50 - 1.10 mg/dL   Calcium 8.5 8.4 - 10.5 mg/dL   GFR calc non Af Amer 63 (L) >90 mL/min   GFR calc Af Amer 73 (L) >90 mL/min    Comment: (NOTE) The eGFR has been calculated using the CKD EPI equation. This calculation has not been validated in all clinical situations. eGFR's persistently <90 mL/min signify possible Chronic Kidney Disease.    Anion gap 5 5 - 15  Urinalysis, Routine w reflex microscopic     Status: Abnormal   Collection Time: 02/11/14  9:25 PM  Result Value Ref Range    Color, Urine YELLOW YELLOW   APPearance CLOUDY (A) CLEAR   Specific Gravity, Urine 1.012 1.005 - 1.030   pH 6.5 5.0 - 8.0   Glucose, UA NEGATIVE NEGATIVE mg/dL   Hgb urine dipstick LARGE (A) NEGATIVE   Bilirubin Urine NEGATIVE NEGATIVE   Ketones, ur NEGATIVE NEGATIVE mg/dL   Protein, ur 30 (A) NEGATIVE mg/dL   Urobilinogen, UA 1.0 0.0 - 1.0 mg/dL   Nitrite POSITIVE (A) NEGATIVE   Leukocytes, UA LARGE (A) NEGATIVE  Urine microscopic-add on     Status: Abnormal   Collection Time: 02/11/14  9:25 PM  Result Value Ref Range   Squamous Epithelial / LPF RARE RARE   WBC, UA TOO NUMEROUS TO COUNT <3 WBC/hpf   RBC / HPF 7-10 <3 RBC/hpf   Bacteria, UA MANY (A) RARE     HEENT: normal Cardio: RRR and no murmur Resp: CTA B/L and unlabored GI: BS positive and NT, ND Extremity:  Pulses positive and No Edema Skin:   Intact, central line R IJ intact Neuro: Confused, Cranial Nerve II-XII normal, Normal Sensory, Normal Motor and Abnormal FMC-LUE Ataxic/ dec FMC Musc/Skel:  Normal GEN NAD   Assessment/Plan: 1. 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 Physiatrist is providing close team supervision and 24 hour management of active medical problems listed below. Physiatrist and rehab team continue to assess barriers to discharge/monitor patient progress toward functional and medical goals.  Will D/C central line once K stabilizes FIM: FIM - Bathing Bathing Steps Patient Completed: Chest, Right Arm, Left Arm, Abdomen, Front perineal area Bathing: 3: Mod-Patient completes 5-7 28f10 parts or 50-74%  FIM - Upper Body Dressing/Undressing Upper body dressing/undressing steps patient completed: Thread/unthread right bra strap, Thread/unthread left bra strap, Thread/unthread right sleeve of pullover shirt/dresss, Thread/unthread left sleeve of pullover shirt/dress, Pull shirt over  trunk Upper body dressing/undressing:  3: Mod-Patient completed 50-74% of tasks FIM - Lower Body Dressing/Undressing Lower body dressing/undressing steps patient completed: Thread/unthread right underwear leg, Thread/unthread left underwear leg, Thread/unthread right pants leg, Thread/unthread left pants leg Lower body dressing/undressing: 3: Mod-Patient completed 50-74% of tasks  FIM - Toileting Toileting steps completed by patient: Adjust clothing prior to toileting, Performs perineal hygiene, Adjust clothing after toileting Toileting Assistive Devices: Grab bar or rail for support Toileting: 4: Steadying assist  FIM - Radio producer Devices: Grab bars Toilet Transfers: 3-To toilet/BSC: Mod A (lift or lower assist), 2-From toilet/BSC: Max A (lift and lower assist)  FIM - Control and instrumentation engineer Devices: Arm rests Bed/Chair Transfer: 2: Bed > Chair or W/C: Max A (lift and lower assist), 2: Chair or W/C > Bed: Max A (lift and lower assist), 4: Sit > Supine: Min A (steadying pt. > 75%/lift 1 leg)  FIM - Locomotion: Wheelchair Distance: 70 Locomotion: Wheelchair: 2: Travels 50 - 149 ft with minimal assistance (Pt.>75%) FIM - Locomotion: Ambulation Locomotion: Ambulation Assistive Devices: Other (comment) (L then R hallway hand rail) Ambulation/Gait Assistance: 1: +2 Total assist, 2: Max assist, Other (comment) (+2A for w/c follow) Locomotion: Ambulation: 1: Two helpers  Comprehension Comprehension Mode: Auditory Comprehension: 5-Understands basic 90% of the time/requires cueing < 10% of the time  Expression Expression Mode: Verbal Expression: 5-Expresses basic needs/ideas: With extra time/assistive device  Social Interaction Social Interaction: 5-Interacts appropriately 90% of the time - Needs monitoring or encouragement for participation or interaction.  Problem Solving Problem Solving: 4-Solves basic 75 - 89% of the  time/requires cueing 10 - 24% of the time  Memory Memory: 4-Recognizes or recalls 75 - 89% of the time/requires cueing 10 - 24% of the time  Medical Problem List and Plan: 1. 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  DVT Prophylaxis/Anticoagulation: Subcutaneous Lovenox. Monitor platelet counts in any signs of bleeding 3. Pain Management/back pain due to osteoporosis: Percocet 1 tablet every 6 hours as needed as prior to admission. Monitor mental status 4. Mood/dementia: Discussed baseline cognition with family, may have depression as well, neuropsych eval 5. Neuropsych: This patient is capable of making decisions on her own behalf. 6. Skin/Wound Care: Routine skin checks 7. Fluids/Electrolytes/Nutrition/hypokalemia: Strict I know follow-up chemistries 8. Hypertension. Norvasc 5 mg daily, Avapro 300 mg daily.Monitor with increased mobility 9. Hyperlipidemia. Lipitor 10. Refractory nausea/dizziness. PRN antivert, scheduled scopolamine patch. Vestibular evaluation to be completed 11. Dysphagia. Dysphagia 3 thin liquid diet. Follow speech therapy 12.  HypoK will replete po if poss, doesn't tolerate "big pills", trial liquid, repeat BMET 13.  UTI start empiric abx LOS (Days) 3 A FACE TO FACE EVALUATION WAS PERFORMED  KIRSTEINS,ANDREW E 02/12/2014, 6:30 AM

## 2014-02-12 NOTE — Progress Notes (Signed)
Occupational Therapy Session Note  Patient Details  Name: [name] MRN: [id number] Date of Birth: 1933/03/24  Today's Date: 02/12/2014 OT Individual Time:  -   1200-1300  (60 min)      Short Term Goals: Week 1:  OT Short Term Goal 1 (Week 1): Pt will complete bathing at sit > stand level with min assist OT Short Term Goal 2 (Week 1): Pt will complete LB dressing at sit > stand level with mod assist OT Short Term Goal 3 (Week 1): Pt will complete UB dressing with min assist OT Short Term Goal 4 (Week 1): Pt will complete toilet transfer with min assist OT Short Term Goal 5 (Week 1): Pt will complete 2 grooming tasks in standing with min assist  Skilled Therapeutic Interventions/Progress Updates:    1) Engaged in ADL retraining with focus on transfer, sit > stand, standing balance, and increased participation in self-care tasks.  Pt. Sitting in recliner upon OT arrival.  Son and daughter in law preset.  Pt eating watermelon that family brought up from home.  Did agree to take shower.  Pt. ambulated with HHA to shower with flexed posture and lateral lean to left.  Transferred to shower seat with mod assist.  Pt. Bathed in seated position with one sit to stand for peri care.  Stood with grab bar and minimal assist.  Transferred to wc and completed dressing with increased time.  Sister arrived at end of session.  Pt left in wc with all needs in reach and safety belt on.    Therapy Documentation Precautions:  Precautions Precautions: Fall Restrictions Weight Bearing Restrictions: No       Pain: Pain Assessment Pain Assessment: No/denies pain  :    See FIM for current functional status  Therapy/Group: Individual Therapy  Lisa Roca 02/12/2014, 10:43 AM

## 2014-02-12 NOTE — Progress Notes (Signed)
Speech Language Pathology Daily Session Note  Patient Details  Name: [name] MRN: [id number] Date of Birth: 03-05-1933  Today's Date: 02/12/2014 SLP Individual Time: 1400-1500 SLP Individual Time Calculation (min): 60 min  Short Term Goals: Week 1: SLP Short Term Goal 1 (Week 1): Pt will tolerate presentations of her currently prescribed diet with no overt s/s of aspiration and supervision cues for use of swallowing precautions.   SLP Short Term Goal 2 (Week 1): Pt will tolerate trials of advanced consistencies with supervision cues to monitor and correct pocketing.   SLP Short Term Goal 3 (Week 1): Pt will improve use of compensatory aids for memory to facilitate improved recall of daily information with min assist.   Skilled Therapeutic Interventions:  Pt was seen for skilled ST targeting cognitive goals.  Upon arrival, pt was reclined in bed, awake, lethargic, but agreeable to participate in ST with min encouragement.  Pt transferred to the wheelchair to maximize alertness and attention during structured tasks.  SLP facilitated the session with a basic card game targeting working memory and functional problem solving.  Pt required overall mod assist to complete the task due to decreased error awareness, decreased selective attention, and decreased recall of task rules.  Throughout the task, pt benefited from min cues for redirection as she was both internally and externally distracted.  At the end of today's session, pt asked for assistance appropriately to use the bathroom; however, she benefited from min cues for safety awareness due to slight impulsivity resulting from urgency.  Pt left with nurse tech on bedside commode.  Continue per current plan of care.   FIM:  Comprehension Comprehension Mode: Auditory Comprehension: 5-Understands complex 90% of the time/Cues < 10% of the time Expression Expression Mode: Verbal Expression: 5-Expresses basic needs/ideas: With extra  time/assistive device Social Interaction Social Interaction: 4-Interacts appropriately 75 - 89% of the time - Needs redirection for appropriate language or to initiate interaction. Problem Solving Problem Solving: 4-Solves basic 75 - 89% of the time/requires cueing 10 - 24% of the time Memory Memory: 4-Recognizes or recalls 75 - 89% of the time/requires cueing 10 - 24% of the time FIM - Eating Eating Activity: 5: Set-up assist for open containers  Pain Pain Assessment Pain Assessment: No/denies pain  Therapy/Group: Individual Therapy  [name], Selinda Orion 02/12/2014, 2:44 PM

## 2014-02-12 NOTE — Progress Notes (Signed)
Physical Therapy Session Note  Patient Details  Name: [name] MRN: [id number] Date of Birth: 18-Dec-1933  Today's Date: 02/12/2014 PT Individual Time: 0800-0900 PT Individual Time Calculation (min): 60 min   Short Term Goals: Week 1:  PT Short Term Goal 1 (Week 1): Pt will peform supine>sit with Max A with HOB flat using bed rails with 75% cueing. PT Short Term Goal 2 (Week 1): Pt will perform sit > supine with HOB flat, Min A, and 25% cueing. PT Short Term Goal 3 (Week 1): Pt will consistently transfe from bed<>w/c with mod A and 50% cueing. PT Short Term Goal 4 (Week 1): Pt will ambulate x30' with LRAD, Max A of single therapist, and 75% cueing. PT Short Term Goal 5 (Week 1): Pt will negotiate 5 stairs with 2 rails wth Max A of single therapist and 75% cueing.  Skilled Therapeutic Interventions/Progress Updates:  Pt was seen bedside in the am, with daughter at bedside. Pt able to perform bridging with S and verbal cues to assist with brief and pants. Pt transferred supine to edge of bed with head of bed elevated, side rail and mod A with verbal cues. Pt transferred edge of bed to w/c with mod A and multiple verbal cues. Pt propelled w/c with B UEs, increased time with multiple verbal cues for about 60 feet with S to min A. Pt transferred from w/c to edge of mat with max A and multiple verbal cues. In gym treatment focused on NMR in sitting and standing. Focused on weight shifting, upright posture, and unilateral stance. Pt ambulated with hand hold assist and max A for about 30 feet. Pt propelled w/c about 60 feet with B UEs, increased time, verbal cues and S. Pt required frequent verbal cues throughout treatment session to remain focused on task at hand. Pt tends to shift weight to the R and posterior requiring verbal and tactile cues to correct. Following treatment pt returned to room and left sitting up in w/c with family at bedside.   Therapy Documentation Precautions:   Precautions Precautions: Fall Restrictions Weight Bearing Restrictions: No General:   Pain: No c/o pain.    Locomotion : Ambulation Ambulation/Gait Assistance: 2: Max assist   See FIM for current functional status  Therapy/Group: Individual Therapy  Dub Amis 02/12/2014, 9:47 AM

## 2014-02-13 ENCOUNTER — Inpatient Hospital Stay (HOSPITAL_COMMUNITY): Payer: Medicare Other

## 2014-02-13 ENCOUNTER — Inpatient Hospital Stay (HOSPITAL_COMMUNITY): Payer: Medicare Other | Admitting: *Deleted

## 2014-02-13 DIAGNOSIS — I6931 Cognitive deficits following cerebral infarction: Secondary | ICD-10-CM

## 2014-02-13 MED ORDER — POTASSIUM CHLORIDE 20 MEQ/15ML (10%) PO SOLN
20.0000 meq | Freq: Three times a day (TID) | ORAL | Status: DC
  Administered 2014-02-13 (×3): 20 meq via ORAL
  Filled 2014-02-13 (×7): qty 15

## 2014-02-13 NOTE — Progress Notes (Signed)
Occupational Therapy Session Note  Patient Details  Name: [name] MRN: [id number] Date of Birth: Apr 26, 1933  Today's Date: 02/13/2014 OT Individual Time:  -   0800-0900  (60 pain)  Short Term Goals: Week 1:  OT Short Term Goal 1 (Week 1): Pt will complete bathing at sit > stand level with min assist OT Short Term Goal 2 (Week 1): Pt will complete LB dressing at sit > stand level with mod assist OT Short Term Goal 3 (Week 1): Pt will complete UB dressing with min assist OT Short Term Goal 4 (Week 1): Pt will complete toilet transfer with min assist OT Short Term Goal 5 (Week 1): Pt will complete 2 grooming tasks in standing with min assist     Skilled Therapeutic Interventions/Progress Updates:    Skilled OT intervention with treatment focus on the following:  Bed mobility, transfers, strength, endurance, standing balance with education on strategies, midline control. Pt. Lying in bed.  Transitioned  to EOB ith minimal assist.  Ambulated to bathroom with max assist +HHA + multi modal. cues for posture and midline control.  Bathed at sink with increased time and sit to stand for peri care.  Needed max assist for standting balance and to shift weight  to her toes.  At one point, pt stood on her tip toes to demonstrate her ability to do.the activity.  Stood for 1-2 minutes before sitting.  Daughter, [name] present during session.  [name] reports pt is very different than before with decreased memory and saying outrageous things.  Pt left in wc with safety belt on and all needs in reach.    Therapy Documentation Precautions:  Precautions Precautions: Fall Restrictions Weight Bearing Restrictions: No   Pain: Pain Assessment Pain Assessment: No/denies pain     See FIM for current functional status  Therapy/Group: Individual Therapy  Lisa Roca 02/13/2014, 7:39 AM

## 2014-02-13 NOTE — Progress Notes (Signed)
Subjective/Complaints: Yells out instead of using call bell Difficulty using remote, adjusting volume Oriented to [facility] and "stroke"  Review of Systems - Negative except had 2 BMs today, limited by cognition Objective: Vital Signs: Blood pressure 111/49, pulse 70, temperature 98.4 F (36.9 C), temperature source Oral, resp. rate 17, height 5' 4"  (1.626 m), weight 60.5 kg (133 lb 6.1 oz), SpO2 99 %. No results found. Results for orders placed or performed during the hospital encounter of 02/09/14 (from the past 72 hour(s))  Basic metabolic panel     Status: Abnormal   Collection Time: 02/11/14  5:55 AM  Result Value Ref Range   Sodium 135 135 - 145 mmol/L   Potassium 3.1 (L) 3.5 - 5.1 mmol/L   Chloride 100 96 - 112 mmol/L   CO2 30 19 - 32 mmol/L   Glucose, Bld 93 70 - 99 mg/dL   BUN 13 6 - 23 mg/dL   Creatinine, Ser 0.86 0.50 - 1.10 mg/dL   Calcium 8.5 8.4 - 10.5 mg/dL   GFR calc non Af Amer 63 (L) >90 mL/min   GFR calc Af Amer 73 (L) >90 mL/min    Comment: (NOTE) The eGFR has been calculated using the CKD EPI equation. This calculation has not been validated in all clinical situations. eGFR's persistently <90 mL/min signify possible Chronic Kidney Disease.    Anion gap 5 5 - 15  Urinalysis, Routine w reflex microscopic     Status: Abnormal   Collection Time: 02/11/14  9:25 PM  Result Value Ref Range   Color, Urine YELLOW YELLOW   APPearance CLOUDY (A) CLEAR   Specific Gravity, Urine 1.012 1.005 - 1.030   pH 6.5 5.0 - 8.0   Glucose, UA NEGATIVE NEGATIVE mg/dL   Hgb urine dipstick LARGE (A) NEGATIVE   Bilirubin Urine NEGATIVE NEGATIVE   Ketones, ur NEGATIVE NEGATIVE mg/dL   Protein, ur 30 (A) NEGATIVE mg/dL   Urobilinogen, UA 1.0 0.0 - 1.0 mg/dL   Nitrite POSITIVE (A) NEGATIVE   Leukocytes, UA LARGE (A) NEGATIVE  Urine microscopic-add on     Status: Abnormal   Collection Time: 02/11/14  9:25 PM  Result Value Ref Range   Squamous Epithelial / LPF RARE RARE   WBC, UA  TOO NUMEROUS TO COUNT <3 WBC/hpf   RBC / HPF 7-10 <3 RBC/hpf   Bacteria, UA MANY (A) RARE  Basic metabolic panel     Status: Abnormal   Collection Time: 02/12/14  7:17 AM  Result Value Ref Range   Sodium 134 (L) 135 - 145 mmol/L   Potassium 3.2 (L) 3.5 - 5.1 mmol/L   Chloride 98 96 - 112 mmol/L   CO2 29 19 - 32 mmol/L   Glucose, Bld 98 70 - 99 mg/dL   BUN 10 6 - 23 mg/dL   Creatinine, Ser 0.83 0.50 - 1.10 mg/dL   Calcium 8.4 8.4 - 10.5 mg/dL   GFR calc non Af Amer 65 (L) >90 mL/min   GFR calc Af Amer 76 (L) >90 mL/min    Comment: (NOTE) The eGFR has been calculated using the CKD EPI equation. This calculation has not been validated in all clinical situations. eGFR's persistently <90 mL/min signify possible Chronic Kidney Disease.    Anion gap 7 5 - 15     HEENT: normal Cardio: RRR and no murmur Resp: CTA B/L and unlabored GI: BS positive and NT, ND Extremity:  Pulses positive and No Edema Skin:   Intact, central line R  IJ intact Neuro: Confused, Cranial Nerve II-XII normal, Normal Sensory, Normal Motor and Abnormal FMC-LUE Ataxic/ dec FMC Musc/Skel:  Normal GEN NAD   Assessment/Plan: 1. 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 Physiatrist is providing close team supervision and 24 hour management of active medical problems listed below. Physiatrist and rehab team continue to assess barriers to discharge/monitor patient progress toward functional and medical goals.  Will D/C central line once K stabilizes, recheck BMET 2/8 Increase K to TID FIM: FIM - Bathing Bathing Steps Patient Completed: Chest, Right Arm, Left Arm, Abdomen, Front perineal area, Right upper leg, Left upper leg, Buttocks Bathing: 4: Min-Patient completes 8-9 32f10 parts or 75+ percent  FIM - Upper Body Dressing/Undressing Upper body dressing/undressing steps patient completed:  Thread/unthread right bra strap, Thread/unthread left bra strap, Thread/unthread right sleeve of pullover shirt/dresss, Thread/unthread left sleeve of pullover shirt/dress, Pull shirt over trunk Upper body dressing/undressing: 3: Mod-Patient completed 50-74% of tasks FIM - Lower Body Dressing/Undressing Lower body dressing/undressing steps patient completed: Thread/unthread right underwear leg, Thread/unthread left underwear leg, Thread/unthread right pants leg, Thread/unthread left pants leg Lower body dressing/undressing: 3: Mod-Patient completed 50-74% of tasks  FIM - Toileting Toileting steps completed by patient: Performs perineal hygiene Toileting Assistive Devices: Grab bar or rail for support Toileting: 2: Max-Patient completed 1 of 3 steps  FIM - TRadio producerDevices: BRecruitment consultantTransfers: 3-From toilet/BSC: Mod A (lift or lower assist), 3-To toilet/BSC: Mod A (lift or lower assist)  FIM - BControl and instrumentation engineerDevices: Arm rests, HOB elevated, Bed rails Bed/Chair Transfer: 3: Supine > Sit: Mod A (lifting assist/Pt. 50-74%/lift 2 legs, 3: Bed > Chair or W/C: Mod A (lift or lower assist), 2: Chair or W/C > Bed: Max A (lift and lower assist)  FIM - Locomotion: Wheelchair Distance: 70 Locomotion: Wheelchair: 2: Travels 50 - 149 ft with supervision, cueing or coaxing FIM - Locomotion: Ambulation Locomotion: Ambulation Assistive Devices: Other (comment) (hand hold assist) Ambulation/Gait Assistance: 2: Max assist Locomotion: Ambulation: 1: Travels less than 50 ft with maximal assistance (Pt: 25 - 49%)  Comprehension Comprehension Mode: Auditory Comprehension: 5-Understands basic 90% of the time/requires cueing < 10% of the time  Expression Expression Mode: Verbal Expression: 5-Expresses basic needs/ideas: With extra time/assistive device  Social Interaction Social Interaction: 5-Interacts appropriately 90% of  the time - Needs monitoring or encouragement for participation or interaction.  Problem Solving Problem Solving: 4-Solves basic 75 - 89% of the time/requires cueing 10 - 24% of the time  Memory Memory: 4-Recognizes or recalls 75 - 89% of the time/requires cueing 10 - 24% of the time  Medical Problem List and Plan: 1. 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  DVT Prophylaxis/Anticoagulation: Subcutaneous Lovenox. Monitor platelet counts in any signs of bleeding 3. Pain Management/back pain due to osteoporosis: Percocet 1 tablet every 6 hours as needed as prior to admission. Monitor mental status 4. Mood/dementia: Discussed baseline cognition with family, may have depression as well, neuropsych eval 5. Neuropsych: This patient is capable of making decisions on her own behalf. 6. Skin/Wound Care: Routine skin checks 7. Fluids/Electrolytes/Nutrition/hypokalemia: Strict I know follow-up chemistries 8. Hypertension. Norvasc 5 mg daily, Avapro 300 mg daily.Monitor with increased mobility 9. Hyperlipidemia. Lipitor 10. Refractory nausea/dizziness. PRN antivert, scheduled scopolamine patch. Vestibular evaluation to be completed 11. Dysphagia. Dysphagia 3 thin liquid diet. Follow speech therapy  12.  HypoK will replete po if poss, doesn't tolerate "big pills", trial liquid, repeat BMET 13.  UTI start empiric abx LOS (Days) 4 A FACE TO FACE EVALUATION WAS PERFORMED  KIRSTEINS,ANDREW E 02/13/2014, 7:39 AM

## 2014-02-14 ENCOUNTER — Inpatient Hospital Stay (HOSPITAL_COMMUNITY): Payer: Medicare Other

## 2014-02-14 ENCOUNTER — Inpatient Hospital Stay (HOSPITAL_COMMUNITY): Payer: Medicare Other | Admitting: Physical Therapy

## 2014-02-14 ENCOUNTER — Inpatient Hospital Stay (HOSPITAL_COMMUNITY): Payer: Medicare Other | Admitting: Occupational Therapy

## 2014-02-14 ENCOUNTER — Ambulatory Visit: Payer: Medicare Other | Admitting: Family Medicine

## 2014-02-14 DIAGNOSIS — R4189 Other symptoms and signs involving cognitive functions and awareness: Secondary | ICD-10-CM

## 2014-02-14 LAB — BASIC METABOLIC PANEL
ANION GAP: 4 — AB (ref 5–15)
BUN: 6 mg/dL (ref 6–23)
CHLORIDE: 103 mmol/L (ref 96–112)
CO2: 29 mmol/L (ref 19–32)
CREATININE: 0.92 mg/dL (ref 0.50–1.10)
Calcium: 8.9 mg/dL (ref 8.4–10.5)
GFR calc Af Amer: 67 mL/min — ABNORMAL LOW (ref >90)
GFR calc non Af Amer: 58 mL/min — ABNORMAL LOW (ref >90)
Glucose, Bld: 93 mg/dL (ref 70–99)
Potassium: 4.6 mmol/L (ref 3.5–5.1)
SODIUM: 136 mmol/L (ref 135–145)

## 2014-02-14 LAB — URINE CULTURE

## 2014-02-14 MED ORDER — PANTOPRAZOLE SODIUM 40 MG PO TBEC
40.0000 mg | DELAYED_RELEASE_TABLET | Freq: Every day | ORAL | Status: DC
  Administered 2014-02-14 – 2014-02-23 (×10): 40 mg via ORAL
  Filled 2014-02-14 (×12): qty 1

## 2014-02-14 NOTE — Progress Notes (Signed)
Occupational Therapy Session Note  Patient Details  Name: [name] MRN: [id number] Date of Birth: [birth year]  Today's Date: 02/14/2014 OT Individual Time: 4709-6283 OT Individual Time Calculation (min): 60 min    Short Term Goals: Week 1:  OT Short Term Goal 1 (Week 1): Pt will complete bathing at sit > stand level with min assist OT Short Term Goal 2 (Week 1): Pt will complete LB dressing at sit > stand level with mod assist OT Short Term Goal 3 (Week 1): Pt will complete UB dressing with min assist OT Short Term Goal 4 (Week 1): Pt will complete toilet transfer with min assist OT Short Term Goal 5 (Week 1): Pt will complete 2 grooming tasks in standing with min assist  Skilled Therapeutic Interventions/Progress Updates:    Engaged in ADL retraining with focus on sit <> stand, standing balance, and transfers during self-care tasks.  Pt in bed upon arrival, completed bed mobility and stand step transfer to w/c with min assist.  Pt with improved weight shifting to achieve standing this session, continuing to require min-mod tactile cues to promote anterior weight shift depending on task and surface.  Mod assist stand step transfer to/from [facility], pt completed all tasks of toileting this session with steady assist to maintain upright standing.  Pt with requests of "don't help, let me try" with sit > stand and dressing tasks.  Min/steady assist overall with bathing and dressing this session.  Setup for breakfast with pt able to obtain all items and open packets without assist this session.  Therapy Documentation Precautions:  Precautions Precautions: Fall Restrictions Weight Bearing Restrictions: No General:   Vital Signs: Therapy Vitals Temp: 98.4 F (36.9 C) Temp Source: Oral Pulse Rate: 74 Resp: 18 BP: (!) 113/57 mmHg Patient Position (if appropriate): Lying Oxygen Therapy SpO2: 99 % O2 Device: Not Delivered Pain:  Pt with no c/o pain  See FIM for current functional  status  Therapy/Group: Individual Therapy  Simonne Come 02/14/2014, 8:27 AM

## 2014-02-14 NOTE — Progress Notes (Signed)
Speech Language Pathology Daily Session Note  Patient Details  Name: [name] MRN: [id number] Date of Birth: 1933/04/18  Today's Date: 02/14/2014 SLP Individual Time: 2197-5883 SLP Individual Time Calculation (min): 30 min  Short Term Goals: Week 1: SLP Short Term Goal 1 (Week 1): Pt will tolerate presentations of her currently prescribed diet with no overt s/s of aspiration and supervision cues for use of swallowing precautions.   SLP Short Term Goal 2 (Week 1): Pt will tolerate trials of advanced consistencies with supervision cues to monitor and correct pocketing.   SLP Short Term Goal 3 (Week 1): Pt will improve use of compensatory aids for memory to facilitate improved recall of daily information with min assist.   Skilled Therapeutic Interventions: Skilled treatment focused on cognitive goals. SLP facilitated session with new learning task, with Mod cues provided for working memory to recall instructions. Pt also required Mod cues for sustained attention and self-monitoring. Pt required Max cues for basic problem solving to demonstrate use of call bell - RN made aware. Continue plan of care.   FIM:  Comprehension Comprehension Mode: Auditory Comprehension: 4-Understands basic 75 - 89% of the time/requires cueing 10 - 24% of the time Expression Expression Mode: Verbal Expression: 5-Expresses basic 90% of the time/requires cueing < 10% of the time. Social Interaction Social Interaction: 2-Interacts appropriately 25 - 49% of time - Needs frequent redirection. Problem Solving Problem Solving: 3-Solves basic 50 - 74% of the time/requires cueing 25 - 49% of the time Memory Memory: 2-Recognizes or recalls 25 - 49% of the time/requires cueing 51 - 75% of the time  Pain Pain Assessment Pain Assessment: No/denies pain  Therapy/Group: Individual Therapy   Germain Osgood, M.A. CCC-SLP [phone]  Germain Osgood 02/14/2014, 12:23 PM

## 2014-02-14 NOTE — Progress Notes (Signed)
Patient's daughter, [name], requests to speak with the PA about some concerns regarding the patient's care.  Daughter is concerned about patient's increasing confusion, medications, and history of dementia.  Marlowe [place]ores, PA notified of request and states he will consult with the physician and speak with the family tomorrow.  Will continue to monitor.

## 2014-02-14 NOTE — Progress Notes (Signed)
Subjective/Complaints: Pt awake, waiting for daughter before having breakfast  Review of Systems - Negative except had 2 BMs today, limited by cognition Objective: Vital Signs: Blood pressure 113/57, pulse 74, temperature 98.4 F (36.9 C), temperature source Oral, resp. rate 18, height 5' 4"  (1.626 m), weight 60.5 kg (133 lb 6.1 oz), SpO2 99 %. No results found. Results for orders placed or performed during the hospital encounter of 02/09/14 (from the past 72 hour(s))  Urinalysis, Routine w reflex microscopic     Status: Abnormal   Collection Time: 02/11/14  9:25 PM  Result Value Ref Range   Color, Urine YELLOW YELLOW   APPearance CLOUDY (A) CLEAR   Specific Gravity, Urine 1.012 1.005 - 1.030   pH 6.5 5.0 - 8.0   Glucose, UA NEGATIVE NEGATIVE mg/dL   Hgb urine dipstick LARGE (A) NEGATIVE   Bilirubin Urine NEGATIVE NEGATIVE   Ketones, ur NEGATIVE NEGATIVE mg/dL   Protein, ur 30 (A) NEGATIVE mg/dL   Urobilinogen, UA 1.0 0.0 - 1.0 mg/dL   Nitrite POSITIVE (A) NEGATIVE   Leukocytes, UA LARGE (A) NEGATIVE  Urine microscopic-add on     Status: Abnormal   Collection Time: 02/11/14  9:25 PM  Result Value Ref Range   Squamous Epithelial / LPF RARE RARE   WBC, UA TOO NUMEROUS TO COUNT <3 WBC/hpf   RBC / HPF 7-10 <3 RBC/hpf   Bacteria, UA MANY (A) RARE  Culture, Urine     Status: None (Preliminary result)   Collection Time: 02/11/14  9:26 PM  Result Value Ref Range   Specimen Description URINE, CLEAN CATCH    Special Requests NONE    Colony Count      >=100,000 COLONIES/ML Performed at [place] Performed at [facility]    Report Status PENDING   Basic metabolic panel     Status: Abnormal   Collection Time: 02/12/14  7:17 AM  Result Value Ref Range   Sodium 134 (L) 135 - 145 mmol/L   Potassium 3.2 (L) 3.5 - 5.1 mmol/L   Chloride 98 96 - 112 mmol/L   CO2 29 19 - 32 mmol/L   Glucose, Bld 98 70 - 99 mg/dL   BUN 10 6 - 23  mg/dL   Creatinine, Ser 0.83 0.50 - 1.10 mg/dL   Calcium 8.4 8.4 - 10.5 mg/dL   GFR calc non Af Amer 65 (L) >90 mL/min   GFR calc Af Amer 76 (L) >90 mL/min    Comment: (NOTE) The eGFR has been calculated using the CKD EPI equation. This calculation has not been validated in all clinical situations. eGFR's persistently <90 mL/min signify possible Chronic Kidney Disease.    Anion gap 7 5 - 15  Basic metabolic panel     Status: Abnormal   Collection Time: 02/14/14  5:15 AM  Result Value Ref Range   Sodium 136 135 - 145 mmol/L   Potassium 4.6 3.5 - 5.1 mmol/L    Comment: DELTA CHECK NOTED   Chloride 103 96 - 112 mmol/L   CO2 29 19 - 32 mmol/L   Glucose, Bld 93 70 - 99 mg/dL   BUN 6 6 - 23 mg/dL   Creatinine, Ser 0.92 0.50 - 1.10 mg/dL   Calcium 8.9 8.4 - 10.5 mg/dL   GFR calc non Af Amer 58 (L) >90 mL/min   GFR calc Af Amer 67 (L) >90 mL/min    Comment: (NOTE)  The eGFR has been calculated using the CKD EPI equation. This calculation has not been validated in all clinical situations. eGFR's persistently <90 mL/min signify possible Chronic Kidney Disease.    Anion gap 4 (L) 5 - 15     HEENT: normal Cardio: RRR and no murmur Resp: CTA B/L and unlabored GI: BS positive and NT, ND Extremity:  Pulses positive and No Edema Skin:   Intact, central line R IJ intact Neuro: Confused, Cranial Nerve II-XII normal, Normal Sensory, Normal Motor and Abnormal FMC-LUE Ataxic/ dec FMC Musc/Skel:  Normal GEN NAD   Assessment/Plan: 1. 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 Physiatrist is providing close team supervision and 24 hour management of active medical problems listed below. Physiatrist and rehab team continue to assess barriers to discharge/monitor patient progress toward functional and medical goals.  Will D/C central line FIM: FIM - Bathing Bathing Steps Patient  Completed: Chest, Right Arm, Left Arm, Abdomen, Front perineal area, Right upper leg, Left upper leg, Buttocks Bathing: 4: Min-Patient completes 8-9 37f10 parts or 75+ percent  FIM - Upper Body Dressing/Undressing Upper body dressing/undressing steps patient completed: Thread/unthread right bra strap, Thread/unthread left bra strap, Thread/unthread right sleeve of pullover shirt/dresss, Thread/unthread left sleeve of pullover shirt/dress, Put head through opening of pull over shirt/dress Upper body dressing/undressing: 3: Mod-Patient completed 50-74% of tasks FIM - Lower Body Dressing/Undressing Lower body dressing/undressing steps patient completed: Thread/unthread right underwear leg, Thread/unthread left underwear leg, Thread/unthread right pants leg, Thread/unthread left pants leg Lower body dressing/undressing: 3: Mod-Patient completed 50-74% of tasks  FIM - Toileting Toileting steps completed by patient: Performs perineal hygiene Toileting Assistive Devices: Grab bar or rail for support Toileting: 1: Two helpers  FIM - TRadio producerDevices: Grab bars Toilet Transfers: 2-To toilet/BSC: Max A (lift and lower assist), 2-From toilet/BSC: Max A (lift and lower assist)  FIM - Bed/Chair Transfer Bed/Chair Transfer Assistive Devices: HOB elevated, Bed rails Bed/Chair Transfer: 3: Supine > Sit: Mod A (lifting assist/Pt. 50-74%/lift 2 legs, 3: Bed > Chair or W/C: Mod A (lift or lower assist), 2: Chair or W/C > Bed: Max A (lift and lower assist)  FIM - Locomotion: Wheelchair Distance: 70 Locomotion: Wheelchair: 2: Travels 50 - 149 ft with supervision, cueing or coaxing FIM - Locomotion: Ambulation Locomotion: Ambulation Assistive Devices: Other (comment) (hand hold assist) Ambulation/Gait Assistance: 2: Max assist Locomotion: Ambulation: 1: Travels less than 50 ft with maximal assistance (Pt: 25 - 49%)  Comprehension Comprehension Mode:  Auditory Comprehension: 5-Understands complex 90% of the time/Cues < 10% of the time  Expression Expression Mode: Verbal Expression: 5-Expresses basic needs/ideas: With no assist  Social Interaction Social Interaction: 5-Interacts appropriately 90% of the time - Needs monitoring or encouragement for participation or interaction.  Problem Solving Problem Solving: 4-Solves basic 75 - 89% of the time/requires cueing 10 - 24% of the time  Memory Memory: 4-Recognizes or recalls 75 - 89% of the time/requires cueing 10 - 24% of the time  Medical Problem List and Plan: 1. 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  DVT Prophylaxis/Anticoagulation: Subcutaneous Lovenox. Monitor platelet counts in any signs of bleeding 3. Pain Management/back pain due to osteoporosis: Percocet 1 tablet every 6 hours as needed as prior to admission. Monitor mental status 4. Mood/dementia: Discussed baseline cognition with family, may have depression as well, neuropsych eval 5. Neuropsych: This patient  is capable of making decisions on her own behalf. 6. Skin/Wound Care: Routine skin checks 7. Fluids/Electrolytes/Nutrition/hypokalemia: Strict I know follow-up chemistries 8. Hypertension. Norvasc 5 mg daily, Avapro 300 mg daily.Monitor with increased mobility 9. Hyperlipidemia. Lipitor 10. Refractory nausea/dizziness. PRN antivert, scheduled scopolamine patch. Vestibular evaluation to be completed 11. Dysphagia. Dysphagia 3 thin liquid diet. Follow speech therapy 12.  HypoK will replete po , repeat BMET with normal K, cont current KCl dose 13.  UTI start empiric abx LOS (Days) 5 A FACE TO FACE EVALUATION WAS PERFORMED  [name] E 02/14/2014, 7:12 AM

## 2014-02-14 NOTE — Progress Notes (Signed)
Physical Therapy Session Note  Patient Details  Name: Gracieann Stannard MRN: [id number] Date of Birth: 05-14-1933  Today's Date: 02/14/2014 PT Individual Time: 7035-0093 and 8182-9937 PT Individual Time Calculation (min): 68 min and 32 min  Short Term Goals: Week 1:  PT Short Term Goal 1 (Week 1): Pt will peform supine>sit with Max A with HOB flat using bed rails with 75% cueing. PT Short Term Goal 2 (Week 1): Pt will perform sit > supine with HOB flat, Min A, and 25% cueing. PT Short Term Goal 3 (Week 1): Pt will consistently transfe from bed<>w/c with mod A and 50% cueing. PT Short Term Goal 4 (Week 1): Pt will ambulate x30' with LRAD, Max A of single therapist, and 75% cueing. PT Short Term Goal 5 (Week 1): Pt will negotiate 5 stairs with 2 rails wth Max A of single therapist and 75% cueing.  Skilled Therapeutic Interventions/Progress Updates:    Treatment Session 1: Pt received seated in recliner; agreeable to therapy. Session focused on stair negotiation, ambulation, and increasing safety awareness. Pt oriented to self only. For place, pt stated, "A gym or something," and for situation pt replied, "To help my back." Within 5 minutes of being reoriented, pt reported being in a "church". RN made aware of change in orientation since last session on 2/5. Although pt less lethargic and required less coaxing to participate, pt noted to be more impulsive during this session as compared with previous sessions, as exhibited by frequently attempting to stand without therapist close by or ready for standing/ambulation. Therefore, majority of cueing focused on safety awareness with inconsistent within-session carryover. See below for detailed description of NMR.  Pt performed w/c mobility x100' in controlled and home environment with bilat UE's and min A for obstacle negotiation on R side. In rehab apartment, pt performed sit>supine with min guard for safety, verbal cueing for safe positioning of RLE in  supine. Performed supine>sit with supervision. Pt performed gait x75' with min to mod A, R HHA, multimodal cueing for postural control due to lateral trunk lean to R side, posterior LOB with starting/stopping ambulation. Transitioned to ambulation x130', x75' (seated rest break between trials) with rolling walker and min A, frequent cueing (attempted verbal, tactile, demonstration, and visual aid on walker) for safe use of rolling walker during transfers, ambulation with ineffective carryover. Pt required frequent cueing throughout session for upright posture due to cervical/thoracic spine flexion. In treatment gym, pt negotiated two 7.5" stairs x2 trials with bilat rails and min A to ascend, mod A to descend, multimodal cueing during initial trial for safe technique with effective return demonstration during subsequent trial. Returned to pt room, where pt ambulated to/from bathroom with rolling walker, assist/cueing as described above.Ptt was left seated in w/c with sister present and all needs within reach.  Treatment Session 2: Pt received seated in recliner with daughter, [name], present. Pt agreeable to therapy. Per daughter questions regarding whether husband should build ramp to enter home (as pt would currently need to negotiate 2 stairs without rails), encouraged daughter to observe PT session to observe pt negotiating stairs. Pt performed functional ambulation to/from bathroom with rolling walker and mod A for safety (due to decreased safety awareness, quick movements). Toilet transfer performed with Mod A and rolling walker. Pt performed gait 2 x75' in controlled environment with rolling walker and min to mod A, frequent verbal cueing to decrease gait speed to increase gait stability, quality of movement. In ortho gym, pt negotiated 6 stairs with  mod A overall. Pt negotiated initial 3 stairs laterally with bilat UE support on L rail to ascend with step-to pattern and verbal, tactile, and demonstration  cueing for technique with inconsistent within-session carryover. Pt negotiated subsequent 3 stairs forward-facing with bilat rails and min A, multimodal cueing for sequencing/technique. Session ended in pt room, where pt was left semi reclined in bed with 3 rails up, bed alarm on, and all needs within reach.  Post-session, daughter expressing concern regarding change in pt affect (from flat at baseline to euphoric since yesterday) as well as cognitive decline since last week. Daughter also informed this PT of psych history. Will recommend neuropsych consult.   Therapy Documentation Precautions:  Precautions Precautions: Fall Restrictions Weight Bearing Restrictions: No Vital Signs: Therapy Vitals Pulse Rate: 87 BP: 120/68 mmHg Patient Position (if appropriate): Sitting Pain: Pain Assessment Pain Assessment: No/denies pain Locomotion : Ambulation Ambulation/Gait Assistance: 3: Mod assist;4: Min assist Wheelchair Mobility Distance: 100  NMR: Neuromuscular Facilitation: Activity to increase motor control;Activity to increase lateral weight shifting;Activity to increase anterior-posterior weight shifting;Other (comment) (Activity to increase trunk control) Pre-gait in front of mirror with min guard to mod A secondary to tendency toward LOB to R side with LLE advancement. Multimodal cueing required for pt attention to visual feedback, using visual feedback for postural correction.  See FIM for current functional status  Therapy/Group: Individual Therapy  Hobble, Malva Cogan 02/14/2014, 12:48 PM

## 2014-02-15 ENCOUNTER — Inpatient Hospital Stay (HOSPITAL_COMMUNITY): Payer: Medicare Other | Admitting: Physical Therapy

## 2014-02-15 ENCOUNTER — Inpatient Hospital Stay (HOSPITAL_COMMUNITY): Payer: Medicare Other | Admitting: Speech Pathology

## 2014-02-15 ENCOUNTER — Inpatient Hospital Stay (HOSPITAL_COMMUNITY): Payer: Medicare Other | Admitting: Occupational Therapy

## 2014-02-15 LAB — BASIC METABOLIC PANEL
Anion gap: 6 (ref 5–15)
CHLORIDE: 102 mmol/L (ref 96–112)
CO2: 30 mmol/L (ref 19–32)
Calcium: 9.1 mg/dL (ref 8.4–10.5)
Creatinine, Ser: 0.98 mg/dL (ref 0.50–1.10)
GFR calc Af Amer: 62 mL/min — ABNORMAL LOW (ref >90)
GFR calc non Af Amer: 53 mL/min — ABNORMAL LOW (ref >90)
GLUCOSE: 101 mg/dL — AB (ref 70–99)
Potassium: 3.7 mmol/L (ref 3.5–5.1)
Sodium: 138 mmol/L (ref 135–145)

## 2014-02-15 NOTE — Plan of Care (Signed)
Problem: RH Other (Specify) Goal: RH LTG Other (Specify) Goal discharged, as son-in-law plans to have ramp installed prior to D/C.

## 2014-02-15 NOTE — Progress Notes (Signed)
Occupational Therapy Session Note  Patient Details  Name: [name] MRN: [id number] Date of Birth: 04/19/1933  Today's Date: 02/15/2014 OT Individual Time: 1700-1749 OT Individual Time Calculation (min): 60 min    Short Term Goals: Week 1:  OT Short Term Goal 1 (Week 1): Pt will complete bathing at sit > stand level with min assist OT Short Term Goal 2 (Week 1): Pt will complete LB dressing at sit > stand level with mod assist OT Short Term Goal 3 (Week 1): Pt will complete UB dressing with min assist OT Short Term Goal 4 (Week 1): Pt will complete toilet transfer with min assist OT Short Term Goal 5 (Week 1): Pt will complete 2 grooming tasks in standing with min assist  Skilled Therapeutic Interventions/Progress Updates:    Engaged in ADL retraining with focus on carryover of education from prior sessions, sequencing, sit > stand, standing balance, and transfers during self-care tasks.  Pt completed stand pivot transfer bed > w/c with mod assist this session secondary to increase in posterior lean.  Bathing and dressing completed at sit > stand level at sink with mod multimodal cues for sequencing with LB dressing and min/steady assist when standing.  Pt oriented to self, time, and situation and stated she is at [facility].  Ambulated to room toilet with RW (with swivel front wheels) with mod assist to control RW, spoke with PT about changing walker.    Therapy Documentation Precautions:  Precautions Precautions: Fall Restrictions Weight Bearing Restrictions: No General:   Vital Signs: Therapy Vitals Temp: 98.2 F (36.8 C) Pulse Rate: 68 BP: (!) 117/55 mmHg Patient Position (if appropriate): Sitting Oxygen Therapy SpO2: 100 % O2 Device: Not Delivered Pain: Pain Assessment Pain Assessment: 0-10 Pain Score: 8  Pain Type: Acute pain Pain Location: Epigastric Pain Orientation: Mid Pain Descriptors / Indicators: Aching Pain Onset: Gradual Pain Intervention(s): RN  made aware Multiple Pain Sites: No  See FIM for current functional status  Therapy/Group: Individual Therapy  Simonne Come 02/15/2014, 10:48 AM

## 2014-02-15 NOTE — Progress Notes (Addendum)
 Subjective/Complaints: Family concerned about confusion Reviewed "new meds" with son in law Remeron started 2/5 Cipro on 2/6  Review of Systems - Negative except had 2 BMs today, limited by cognition Objective: Vital Signs: Blood pressure 152/57, pulse 86, temperature 98.1 F (36.7 C), temperature source Oral, resp. rate 16, height 5' 4" (1.626 m), weight 60.5 kg (133 lb 6.1 oz), SpO2 96 %. No results found. Results for orders placed or performed during the hospital encounter of 02/09/14 (from the past 72 hour(s))  Basic metabolic panel     Status: Abnormal   Collection Time: 02/12/14  7:17 AM  Result Value Ref Range   Sodium 134 (L) 135 - 145 mmol/L   Potassium 3.2 (L) 3.5 - 5.1 mmol/L   Chloride 98 96 - 112 mmol/L   CO2 29 19 - 32 mmol/L   Glucose, Bld 98 70 - 99 mg/dL   BUN 10 6 - 23 mg/dL   Creatinine, Ser 0.83 0.50 - 1.10 mg/dL   Calcium 8.4 8.4 - 10.5 mg/dL   GFR calc non Af Amer 65 (L) >90 mL/min   GFR calc Af Amer 76 (L) >90 mL/min    Comment: (NOTE) The eGFR has been calculated using the CKD EPI equation. This calculation has not been validated in all clinical situations. eGFR's persistently <90 mL/min signify possible Chronic Kidney Disease.    Anion gap 7 5 - 15  Basic metabolic panel     Status: Abnormal   Collection Time: 02/14/14  5:15 AM  Result Value Ref Range   Sodium 136 135 - 145 mmol/L   Potassium 4.6 3.5 - 5.1 mmol/L    Comment: DELTA CHECK NOTED   Chloride 103 96 - 112 mmol/L   CO2 29 19 - 32 mmol/L   Glucose, Bld 93 70 - 99 mg/dL   BUN 6 6 - 23 mg/dL   Creatinine, Ser 0.92 0.50 - 1.10 mg/dL   Calcium 8.9 8.4 - 10.5 mg/dL   GFR calc non Af Amer 58 (L) >90 mL/min   GFR calc Af Amer 67 (L) >90 mL/min    Comment: (NOTE) The eGFR has been calculated using the CKD EPI equation. This calculation has not been validated in all clinical situations. eGFR's persistently <90 mL/min signify possible Chronic Kidney Disease.    Anion gap 4 (L) 5 - 15      HEENT: normal Cardio: RRR and no murmur Resp: CTA B/L and unlabored GI: BS positive and NT, ND Extremity:  Pulses positive and No Edema Skin:   Intact, central line site without drainage Neuro: Confused, Cranial Nerve II-XII normal, Normal Sensory, Normal Motor and Abnormal FMC-LUE Ataxic/ dec FMC, Motor 5/5 in BUE and 4/5 in BLE Musc/Skel:  Normal GEN NAD   Assessment/Plan: 1. 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 Physiatrist is providing close team supervision and 24 hour management of active medical problems listed below. Physiatrist and rehab team continue to assess barriers to discharge/monitor patient progress toward functional and medical goals.  FIM: FIM - Bathing Bathing Steps Patient Completed: Chest, Right Arm, Left Arm, Abdomen, Front perineal area, Right upper leg, Left upper leg, Buttocks, Right lower leg (including foot), Left lower leg (including foot) Bathing: 4: Steadying assist  FIM - Upper Body Dressing/Undressing Upper body dressing/undressing steps patient completed: Thread/unthread right bra strap, Thread/unthread left bra strap, Hook/unhook bra, Thread/unthread right sleeve of pullover shirt/dresss, Thread/unthread left sleeve   of pullover shirt/dress, Put head through opening of pull over shirt/dress, Pull shirt over trunk Upper body dressing/undressing: 5: Set-up assist to: Obtain clothing/put away FIM - Lower Body Dressing/Undressing Lower body dressing/undressing steps patient completed: Thread/unthread right underwear leg, Thread/unthread left underwear leg, Pull underwear up/down, Thread/unthread right pants leg, Thread/unthread left pants leg, Pull pants up/down, Don/Doff right shoe, Don/Doff left shoe, Fasten/unfasten right shoe, Fasten/unfasten left shoe Lower body dressing/undressing: 4: Steadying Assist  FIM - Toileting Toileting steps  completed by patient: Adjust clothing prior to toileting, Performs perineal hygiene, Adjust clothing after toileting Toileting Assistive Devices: Grab bar or rail for support Toileting: 4: Steadying assist  FIM - Toilet Transfers Toilet Transfers Assistive Devices: Bedside commode, Grab bars Toilet Transfers: 3-From toilet/BSC: Mod A (lift or lower assist), 3-To toilet/BSC: Mod A (lift or lower assist)  FIM - Bed/Chair Transfer Bed/Chair Transfer Assistive Devices: Arm rests, Walker Bed/Chair Transfer: 3: Bed > Chair or W/C: Mod A (lift or lower assist), 3: Chair or W/C > Bed: Mod A (lift or lower assist)  FIM - Locomotion: Wheelchair Distance: 100 Locomotion: Wheelchair: 2: Travels 50 - 149 ft with minimal assistance (Pt.>75%) FIM - Locomotion: Ambulation Locomotion: Ambulation Assistive Devices: Walker - Rolling Ambulation/Gait Assistance: 3: Mod assist, 4: Min assist Locomotion: Ambulation: 2: Travels 50 - 149 ft with moderate assistance (Pt: 50 - 74%)  Comprehension Comprehension Mode: Auditory Comprehension: 4-Understands basic 75 - 89% of the time/requires cueing 10 - 24% of the time  Expression Expression Mode: Verbal Expression: 5-Expresses complex 90% of the time/cues < 10% of the time  Social Interaction Social Interaction: 3-Interacts appropriately 50 - 74% of the time - May be physically or verbally inappropriate.  Problem Solving Problem Solving: 3-Solves basic 50 - 74% of the time/requires cueing 25 - 49% of the time  Memory Memory: 2-Recognizes or recalls 25 - 49% of the time/requires cueing 51 - 75% of the time  Medical Problem List and Plan: 1. 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  DVT Prophylaxis/Anticoagulation: Subcutaneous Lovenox. Will d/c as pt amb >100' 3. Pain Management/back pain due to osteoporosis: Percocet 1 tablet every 6 hours as needed as prior to admission. Monitor mental status 4. Mood/dementia:  Discussed baseline cognition with family, may have depression as well, neuropsych eval 5. Neuropsych: This patient is capable of making decisions on her own behalf. 6. Skin/Wound Care: Routine skin checks 7. Fluids/Electrolytes/Nutrition/hypokalemia: Strict I know follow-up chemistries 8. Hypertension. Norvasc 5 mg daily, Avapro 300 mg daily.Monitor with increased mobility 9. Hyperlipidemia. Lipitor 10. Refractory nausea/dizziness. PRN antivert, scheduled scopolamine patch. Vestibular evaluation to be completed 11. Dysphagia. Dysphagia 3 thin liquid diet. Follow speech therapy 12.  HypoK will replete po , repeat BMET with normal K, cont current KCl dose 13.  UTI start empiric abx LOS (Days) 6 A FACE TO FACE EVALUATION WAS PERFORMED  , E 02/15/2014, 7:02 AM     

## 2014-02-15 NOTE — Progress Notes (Signed)
Physical Therapy Session Note  Patient Details  Name: [name] MRN: [id number] Date of Birth: 1933/05/05  Today's Date: 02/15/2014 PT Individual Time: 0830-0930 PT Individual Time Calculation (min): 60 min   Short Term Goals: Week 1:  PT Short Term Goal 1 (Week 1): Pt will peform supine>sit with Max A with HOB flat using bed rails with 75% cueing. PT Short Term Goal 2 (Week 1): Pt will perform sit > supine with HOB flat, Min A, and 25% cueing. PT Short Term Goal 3 (Week 1): Pt will consistently transfe from bed<>w/c with mod A and 50% cueing. PT Short Term Goal 4 (Week 1): Pt will ambulate x30' with LRAD, Max A of single therapist, and 75% cueing. PT Short Term Goal 5 (Week 1): Pt will negotiate 5 stairs with 2 rails wth Max A of single therapist and 75% cueing.  Skilled Therapeutic Interventions/Progress Updates:    Pt received seated in w/c wearing Ted hose; agreeable to therapy. Pt oriented to self, place, and situation. Pt aware of month and date but stated "Wednesday or Thrusday". Session focused on activity tolerance, facilitating adaptation to central vestibular impairments. Pt performed functional ambulation x210' in controlled and home environments with HHA with min A for stability. Pt declined seated rest break during ambulation trial. Following seated rest break, pt engaged in dynamic standing activity involving watering plants for very brief time (>30 seconds) prior to onset of increased gait instability and pt c/o epigastric pain, which pt attributed to "high hernia"; son-in-law confirmed h/o hiatal hernia.  Returned to chair via ambulation x10' with mod A. In seated, pt resting head on table, reporting feeling "really tired." Immediate vitals as follows: BP 90/50, HR 92, SpO2 100%. RN notified. Provided mod A for stand pivot transfer from standard chair > w/c. Seated BP 76/46. Transported pt back to room in w/c with total A, where RN was present awaiting pt/therapist. In pt room,  performed stand pivot transfer from w/c > bed with mod A and sit > supine with min A for LLE management. Pt required +2A for scooting to [facility] in supine due to decreased arousal. In supine with bilat LE's elevated, vitals noted to be WNL; see vital signs for detailed findings. Gradually elevated HOB in 15-degree increments then performed LE therapeutic exercises to maintain BP. Noted no significant change in vitals with sit>stand. Performed stand pivot transfer from bed > w/c with mod A. Departed with pt seated in w/c with quick release belt in place for safety, RN and son-in-law present, and pt in no apparent distress.  Per son-in-law, Shanon Brow, (present during majority of session), ramp will be built prior to D/C home. Therefore, discharged long term goal for negotiation of 2 stairs without rails.  Therapy Documentation Precautions:  Precautions Precautions: Fall Restrictions Weight Bearing Restrictions: No Vital Signs: Pre-session: Therapy Vitals Temp: 98.1 F (36.7 C) Temp Source: Oral Pulse Rate: 86 Resp: 16 BP: (!) 152/57 mmHg Patient Position (if appropriate): Lying Oxygen Therapy SpO2: 96 % O2 Device: Not Delivered 0855: Pulse Rate: 92 BP: (!) 72/46 Patient Position (if appropriate): Seated Oxygen Therapy SpO2: 100% O2 Device: Not Delivered Pain: Pain Assessment Pain Assessment: 0-10 Pain Score: 8  Pain Type: Acute pain Pain Location: Epigastric Pain Orientation: Mid Pain Descriptors / Indicators: Aching Pain Onset: Gradual Pain Intervention(s): RN made aware Multiple Pain Sites: No Locomotion : Ambulation Ambulation/Gait Assistance: 3: Mod assist;4: Min assist   See FIM for current functional status  Therapy/Group: Individual Therapy  Johnathon Olden, Malva Cogan  02/15/2014, 11:18 AM

## 2014-02-15 NOTE — Progress Notes (Signed)
Speech Language Pathology Daily Session Note  Patient Details  Name: [name] MRN: [id number] Date of Birth: 01-Feb-1933  Today's Date: 02/15/2014 SLP Individual Time: 1003-1103 SLP Individual Time Calculation (min): 60 min  Short Term Goals: Week 1: SLP Short Term Goal 1 (Week 1): Pt will tolerate presentations of her currently prescribed diet with no overt s/s of aspiration and supervision cues for use of swallowing precautions.   SLP Short Term Goal 2 (Week 1): Pt will tolerate trials of advanced consistencies with supervision cues to monitor and correct pocketing.   SLP Short Term Goal 3 (Week 1): Pt will improve use of compensatory aids for memory to facilitate improved recall of daily information with min assist.   Skilled Therapeutic Interventions:  Pt was seen for skilled ST targeting cognitive goals.  Upon arrival,pt was seated upright in wheelchair, awake, lethargic, but agreeable to participate in ST with min encouragement.  SLP facilitated the session with a structured new learning activity targeting working memory, thought organization, and mental flexibility.  Pt required max assist to complete the abovementioned task due to both internal and external distractions, decreased topic maintenance, as well as poor carryover of the rules of the activity.  With task simplification (i.e. Decreasing task stimuli by half, providing written aids for working memory), pt was intermittently able to complete task with mod assist; however, noted improvements and carryover were brief and inconsistent.  Pt benefited from a cognitive rest break by the end of today's session to decrease frustration.  Continue per current plan of care.    FIM:  Comprehension Comprehension Mode: Auditory Comprehension: 4-Understands basic 75 - 89% of the time/requires cueing 10 - 24% of the time Expression Expression Mode: Verbal Expression: 5-Expresses basic 90% of the time/requires cueing < 10% of the  time. Social Interaction Social Interaction: 3-Interacts appropriately 50 - 74% of the time - May be physically or verbally inappropriate. Problem Solving Problem Solving: 3-Solves basic 50 - 74% of the time/requires cueing 25 - 49% of the time Memory Memory: 2-Recognizes or recalls 25 - 49% of the time/requires cueing 51 - 75% of the time  Pain Pain Assessment Pain Assessment: No/denies pain  Therapy/Group: Individual Therapy  Johnta Couts, Selinda Orion 02/15/2014, 10:49 AM

## 2014-02-15 NOTE — Progress Notes (Signed)
Social Work Patient ID: [name], female   DOB: [birth year], 81 y.o.   MRN: [id number] Spoke with daughter via telephone to discuss her concerns, she is most concerned about Mom's confusion.  She discussed with RN last night and is aware she is being Treated for UTI and the stroke itself.  She wants Neurologist to see and is agreeable to Neuro-psych to see Thursday.  She is worried that if she continues to be confused they Will need to come up with an alternative discharge plan, rather than home.  She has been informed pt will require 24 hr supervision at discharge may be more hands on supervision. Will place on Neuro-psych list for tomorrow.

## 2014-02-16 ENCOUNTER — Inpatient Hospital Stay (HOSPITAL_COMMUNITY): Payer: Medicare Other | Admitting: Speech Pathology

## 2014-02-16 ENCOUNTER — Inpatient Hospital Stay (HOSPITAL_COMMUNITY): Payer: Medicare Other | Admitting: Rehabilitation

## 2014-02-16 ENCOUNTER — Encounter (HOSPITAL_COMMUNITY): Payer: Medicare Other | Admitting: Occupational Therapy

## 2014-02-16 ENCOUNTER — Inpatient Hospital Stay (HOSPITAL_COMMUNITY): Payer: Medicare Other | Admitting: Occupational Therapy

## 2014-02-16 MED ORDER — AMLODIPINE BESYLATE 2.5 MG PO TABS
2.5000 mg | ORAL_TABLET | Freq: Every day | ORAL | Status: DC
  Administered 2014-02-17 – 2014-02-21 (×5): 2.5 mg via ORAL
  Filled 2014-02-16 (×7): qty 1

## 2014-02-16 MED ORDER — IRBESARTAN 300 MG PO TABS
300.0000 mg | ORAL_TABLET | Freq: Every day | ORAL | Status: DC
  Administered 2014-02-17 – 2014-02-23 (×7): 300 mg via ORAL
  Filled 2014-02-16 (×8): qty 1

## 2014-02-16 NOTE — Progress Notes (Addendum)
Physical Therapy Session Note  Patient Details  Name: [name] MRN: [id number] Date of Birth: May 13, 1933  Today's Date: 02/16/2014 PT Individual Time: 0930-1030 PT Individual Time Calculation (min): 60 min   Short Term Goals: Week 1:  PT Short Term Goal 1 (Week 1): Pt will peform supine>sit with Max A with HOB flat using bed rails with 75% cueing. PT Short Term Goal 2 (Week 1): Pt will perform sit > supine with HOB flat, Min A, and 25% cueing. PT Short Term Goal 3 (Week 1): Pt will consistently transfe from bed<>w/c with mod A and 50% cueing. PT Short Term Goal 4 (Week 1): Pt will ambulate x30' with LRAD, Max A of single therapist, and 75% cueing. PT Short Term Goal 5 (Week 1): Pt will negotiate 5 stairs with 2 rails wth Max A of single therapist and 75% cueing.  Skilled Therapeutic Interventions/Progress Updates:    Pt received seated in w/c accompanied by son-in-law. Pt agreeable to therapy. Session focused on fall recovery, community mobility, car transfer, and cognitive remediation. Pt performed functional ambulation x75' in controlled and home environments with min A, L HHA. In rehab apartment, explained, demonstrated floor transfer to pt and son-in-law. Pt then performed floor transfer (seated on couch to R side lying on floor) with min A to maintain safe positioning of spine during transitional movements. Pt ambulated >250' in controlled and community environments with rolling walker and min to mod A for stability, mod cueing for safety awareness, upright posture/forward gaze, and safe use of rolling walker (emphasis on safe proximity to pt). In hospital gift shop, pt required 50% verbal/tactile cueing for safe gait speed, obstacle negotiation. Upon returning to rehab unit, pt performed simulated car transfer with rolling walker and min guard, min cueing for safe technique. Returned to room via ambulation x60' in controlled environment with rolling walker, max cueing for path finding and  safety awareness. Departed with pt seated in w/c with quick release belt in place for safety and all needs within reach.  Therapy Documentation Precautions:  Precautions Precautions: Fall Restrictions Weight Bearing Restrictions: No Vital Signs: Therapy Vitals BP: 126/72 mmHg Pain: Pain Assessment Pain Assessment: No/denies pain Locomotion : Ambulation Ambulation/Gait Assistance: 3: Mod assist;4: Min assist   See FIM for current functional status  Therapy/Group: Individual Therapy  Neilson Oehlert, Malva Cogan 02/16/2014, 12:47 PM

## 2014-02-16 NOTE — Progress Notes (Signed)
Speech Language Pathology Daily Session Note  Patient Details  Name: [name] MRN: [id number] Date of Birth: Apr 22, 1933  Today's Date: 02/16/2014 SLP Individual Time: 7782-4235 SLP Individual Time Calculation (min): 55 min  Short Term Goals: Week 1: SLP Short Term Goal 1 (Week 1): Pt will tolerate presentations of her currently prescribed diet with no overt s/s of aspiration and supervision cues for use of swallowing precautions.   SLP Short Term Goal 2 (Week 1): Pt will tolerate trials of advanced consistencies with supervision cues to monitor and correct pocketing.   SLP Short Term Goal 3 (Week 1): Pt will improve use of compensatory aids for memory to facilitate improved recall of daily information with min assist.   Skilled Therapeutic Interventions:  Pt was seen for skilled ST targeting goals for dysphagia and cognition.  Upon arrival, pt was seated upright in wheelchair, awake, alert, and agreeable to participate in ST.  SLP facilitated the session with a trial meal tray of upgraded regular textures.  Pt consumed the abovementioned presentations with supervision cues for use of swallowing precautions and no overt s/s of aspiration.  Pt also exhibited adequate oral manipulation/mastication and clearance of residual solids from the oral cavity post swallow.  Pt is appropriate for a diet upgrade to regular consistencies at this time. Upon completion of meal, pt propelled herself in her wheelchair back to her room with mod verbal cues for use of environmental aids to assist in route recall.  Once in the room, pt asked for assistance to use the bathroom appropriately and benefited from supervision cues throughout for mild impulsivity.  Overall, pt's cognition appears to have cleared substantially in comparison to yesterday's therapy session.  Pt left in bed with call bell and phone within reach.  Continue per current plan of care.    FIM:  Comprehension Comprehension Mode:  Auditory Comprehension: 5-Follows basic conversation/direction: With extra time/assistive device Expression Expression Mode: Verbal Expression: 5-Expresses basic needs/ideas: With extra time/assistive device Social Interaction Social Interaction: 5-Interacts appropriately 90% of the time - Needs monitoring or encouragement for participation or interaction. Problem Solving Problem Solving: 3-Solves basic 50 - 74% of the time/requires cueing 25 - 49% of the time Memory Memory: 3-Recognizes or recalls 50 - 74% of the time/requires cueing 25 - 49% of the time FIM - Eating Eating Activity: 5: Supervision/cues  Pain Pain Assessment Pain Assessment: No/denies pain  Therapy/Group: Individual Therapy  [name], Selinda Orion 02/16/2014, 12:26 PM

## 2014-02-16 NOTE — Patient Care Conference (Signed)
Inpatient RehabilitationTeam Conference and Plan of Care Update Date: 02/16/2014   Time: 10;45 AM    Patient Name: [name]      Medical Record Number: [id number]  Date of Birth: 07/17/1933 Sex: Female         Room/Bed: 4M05C/4M05C-01 Payor Info: Payor: BLUE [place] / Plan: [employer] MEDICARE / Product Type: *No Product type* /    Admitting Diagnosis: L CVA  Admit Date/Time:  02/09/2014  1:30 PM Admission Comments: No comment available   Primary Diagnosis:  <principal problem not specified> Principal Problem: <principal problem not specified>  Patient Active Problem List   Diagnosis Date Noted  . Pica 02/09/2014  . 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 Malnutrition of moderate degree 02/06/2014  . Obstructive hydrocephalus   . Malnutrition   . Hypokalemia   . Occlusion of posterior inferior cerebellar artery with infarction   . Cerebellar stroke   . Cytotoxic cerebral edema 02/01/2014  . Cerebellar stroke, acute   . Nausea without vomiting   . Acute CVA (cerebrovascular accident) 01/31/2014  . Essential hypertension 01/31/2014  . Compression fracture of thoracic spine, non-traumatic 01/19/2014  . Hyperlipemia   . Dementia   . Cerebral hemorrhage, nontraumatic   . CVA (cerebral infarction)   . Ureterocele, acquired     Expected Discharge Date: Expected Discharge Date: 02/26/14  Team Members Present: Physician leading conference: Dr. Alysia Penna Social Worker Present: Ovidio Kin, LCSW Nurse Present: Elliot Cousin, RN PT Present: Raylene Everts, PT;Caroline Lacinda Axon, PT;Blair Hobble, PT OT Present: Simonne Come, OT SLP Present: Windell Moulding, SLP PPS Coordinator present : Daiva Nakayama, RN, CRRN     Current Status/Progress Goal Weekly Team Focus  Medical   sleep improved,   Min A goals  sleep, manage UTI   Bowel/Bladder   Patient has been continent of bowel and bladder. Last BM 2/6 takes senna BID  Remain continent  of bowel and bladder.   Encourage patient to void q2 hours and increase fluid intake.    Swallow/Nutrition/ Hydration   dys 3, thin liquids; intermittent supervision/tray set up;  poor PO intake    Mod I with least restrictive diet   diet toleration, trials of advanced consistencies    ADL's   min assist bathing and dressing, mod assist transfers  supervision overall  increased safety awareness, standing balance, activity tolerance, and family education   Mobility   Min to Mod A overall; limited by fluctuations in cognition and BP  Supervision overall  Increase safey awareness, activity tolerance, postural/gait stability, NMR, dynamic standing balance, and continue family education   Communication             Safety/Cognition/ Behavioral Observations  mod-max assist   supervision, likely will need to be downgraded  memory, attention, functional problem solving    Pain   Patient has had no complaints of pain.   Less than or equal to 3  Assess q shift and prn   Skin   No skin issues noted  No new skin breakdown  Assess skin q shift and prn      *See Care Plan and progress notes for long and short-term goals.  Barriers to Discharge: Will need MinA/S 24/7    Possible Resolutions to Barriers:  SW to discussed caregiver abilities post D/C    Discharge Planning/Teaching Needs:  Home with daughter and hired assist-if doesn;t reach certain level daughter also considering NHP. See what daughter can manage and  also caregivers can manage      Team Discussion:  Cognition/memory issues reason will need 24 hr care-posterior lean backwards will need hands on. Carry over issues due to memory issues.  Neuro-psych to see tomorrow-per daughter's request. Daughter trying to arrange 24 hr care at discharge  Revisions to Treatment Plan:  None   Continued Need for Acute Rehabilitation Level of Care: The patient requires daily medical management by a physician with specialized training in physical  medicine and rehabilitation for the following conditions: Daily direction of a multidisciplinary physical rehabilitation program to ensure safe treatment while eliciting the highest outcome that is of practical value to the patient.: Yes Daily medical management of patient stability for increased activity during participation in an intensive rehabilitation regime.: Yes Daily analysis of laboratory values and/or radiology reports with any subsequent need for medication adjustment of medical intervention for : Neurological problems  [name], [name] 02/16/2014, 3:18 PM

## 2014-02-16 NOTE — Progress Notes (Signed)
Occupational Therapy Session Note  Patient Details  Name: [name] MRN: [id number] Date of Birth: 1933-11-29  Today's Date: 02/16/2014 OT Individual Time: 6384-6659 and 9357-0177 OT Individual Time Calculation (min): 45 min and 30 min   Short Term Goals: Week 1:  OT Short Term Goal 1 (Week 1): Pt will complete bathing at sit > stand level with min assist OT Short Term Goal 2 (Week 1): Pt will complete LB dressing at sit > stand level with mod assist OT Short Term Goal 3 (Week 1): Pt will complete UB dressing with min assist OT Short Term Goal 4 (Week 1): Pt will complete toilet transfer with min assist OT Short Term Goal 5 (Week 1): Pt will complete 2 grooming tasks in standing with min assist  Skilled Therapeutic Interventions/Progress Updates:   1) Engaged in ADL retraining with focus on transfers, sit <> stand, and standing balance.  Pt completed all functional transfers this session and min assist with increased time, mild posterior lean in standing and with transfers.  Bathing completed at sit > stand level in room shower, dressing completed at sit > stand at sink with min assist for balance.  Pt required min-mod verbal cues for safety and sequencing during self-care tasks due to quick movements.  2) Treatment session with focus on functional transfers and mobility in home environment.  Pt in bed upon arrival with sister and daughter present.  Pt donned socks at EOB with increased time secondary to distractions of family members being present.  Ambulated to ADL apt with RW and intermittent min assist progressing to close supervision.  Pt reports fatigue upon arrival to apt, assessed vitals with 105/46 after 3-4 min rest break 103/48, returned to room via w/c and encouraged to sit up. Daughter reports they have a tub/shower and shower chair, that pt would have to step over tub ledge, planned to assess safety with above transfer but due to low BP will defer to another session.  Therapy  Documentation Precautions:  Precautions Precautions: Fall Restrictions Weight Bearing Restrictions: No General:   Vital Signs: Therapy Vitals BP: 126/72 mmHg Pain: Pain Assessment Pain Assessment: No/denies pain  See FIM for current functional status  Therapy/Group: Individual Therapy  Simonne Come 02/16/2014, 10:21 AM

## 2014-02-16 NOTE — Progress Notes (Signed)
Subjective/Complaints: Per RN had good night, slept ok, discussed medical status with daughter this am  Review of Systems - Negative except had 2 BMs today, limited by cognition Objective: Vital Signs: Blood pressure 126/72, pulse 93, temperature 98.1 F (36.7 C), temperature source Oral, resp. rate 18, height 5' 4"  (1.626 m), weight 60.5 kg (133 lb 6.1 oz), SpO2 100 %. No results found. Results for orders placed or performed during the hospital encounter of 02/09/14 (from the past 72 hour(s))  Basic metabolic panel     Status: Abnormal   Collection Time: 02/14/14  5:15 AM  Result Value Ref Range   Sodium 136 135 - 145 mmol/L   Potassium 4.6 3.5 - 5.1 mmol/L    Comment: DELTA CHECK NOTED   Chloride 103 96 - 112 mmol/L   CO2 29 19 - 32 mmol/L   Glucose, Bld 93 70 - 99 mg/dL   BUN 6 6 - 23 mg/dL   Creatinine, Ser 0.92 0.50 - 1.10 mg/dL   Calcium 8.9 8.4 - 10.5 mg/dL   GFR calc non Af Amer 58 (L) >90 mL/min   GFR calc Af Amer 67 (L) >90 mL/min    Comment: (NOTE) The eGFR has been calculated using the CKD EPI equation. This calculation has not been validated in all clinical situations. eGFR's persistently <90 mL/min signify possible Chronic Kidney Disease.    Anion gap 4 (L) 5 - 15  Basic metabolic panel     Status: Abnormal   Collection Time: 02/15/14  6:25 AM  Result Value Ref Range   Sodium 138 135 - 145 mmol/L   Potassium 3.7 3.5 - 5.1 mmol/L   Chloride 102 96 - 112 mmol/L   CO2 30 19 - 32 mmol/L   Glucose, Bld 101 (H) 70 - 99 mg/dL   BUN <5 (L) 6 - 23 mg/dL   Creatinine, Ser 0.98 0.50 - 1.10 mg/dL   Calcium 9.1 8.4 - 10.5 mg/dL   GFR calc non Af Amer 53 (L) >90 mL/min   GFR calc Af Amer 62 (L) >90 mL/min    Comment: (NOTE) The eGFR has been calculated using the CKD EPI equation. This calculation has not been validated in all clinical situations. eGFR's persistently <90 mL/min signify possible Chronic Kidney Disease.    Anion gap 6 5 - 15     HEENT:  normal Cardio: RRR and no murmur Resp: CTA B/L and unlabored GI: BS positive and NT, ND Extremity:  Pulses positive and No Edema Skin:   Intact, central line site without drainage Neuro: Confused, Cranial Nerve II-XII normal, Normal Sensory, Normal Motor and Abnormal FMC-LUE Ataxic/ dec FMC, Motor 5/5 in BUE and 4/5 in BLE Musc/Skel:  Normal GEN NAD   Assessment/Plan: 1. 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 Physiatrist is providing close team supervision and 24 hour management of active medical problems listed below. Physiatrist and rehab team continue to assess barriers to discharge/monitor patient progress toward functional and medical goals. Team conference today please see physician documentation under team conference tab, met with team face-to-face to discuss problems,progress, and goals. Formulized individual treatment plan based on medical history, underlying problem and comorbidities.  FIM: FIM - Bathing Bathing Steps Patient Completed: Chest, Right Arm, Left Arm, Abdomen, Front perineal area, Right upper leg, Left upper leg, Buttocks, Right lower leg (including foot), Left lower leg (including foot) Bathing: 4: Steadying assist  FIM -  Upper Body Dressing/Undressing Upper body dressing/undressing steps patient completed: Thread/unthread right bra strap, Thread/unthread left bra strap, Hook/unhook bra, Thread/unthread right sleeve of pullover shirt/dresss, Thread/unthread left sleeve of pullover shirt/dress, Put head through opening of pull over shirt/dress, Pull shirt over trunk Upper body dressing/undressing: 5: Set-up assist to: Obtain clothing/put away FIM - Lower Body Dressing/Undressing Lower body dressing/undressing steps patient completed: Thread/unthread right underwear leg, Thread/unthread left underwear leg, Pull underwear up/down, Thread/unthread right  pants leg, Thread/unthread left pants leg, Pull pants up/down, Don/Doff right shoe, Don/Doff left shoe, Fasten/unfasten right shoe, Fasten/unfasten left shoe Lower body dressing/undressing: 4: Steadying Assist  FIM - Toileting Toileting steps completed by patient: Adjust clothing prior to toileting, Performs perineal hygiene, Adjust clothing after toileting Toileting Assistive Devices: Grab bar or rail for support Toileting: 4: Steadying assist  FIM - Radio producer Devices: Grab bars, Insurance account manager Transfers: 3-To toilet/BSC: Mod A (lift or lower assist), 4-From toilet/BSC: Min A (steadying Pt. > 75%)  FIM - Bed/Chair Transfer Bed/Chair Transfer Assistive Devices: Arm rests (HHA) Bed/Chair Transfer: 3: Bed > Chair or W/C: Mod A (lift or lower assist), 4: Sit > Supine: Min A (steadying pt. > 75%/lift 1 leg), 3: Chair or W/C > Bed: Mod A (lift or lower assist)  FIM - Locomotion: Wheelchair Distance: 100 Locomotion: Wheelchair: 1: Total Assistance/staff pushes wheelchair (Pt<25%) FIM - Locomotion: Ambulation Locomotion: Ambulation Assistive Devices: Other (comment) (HHA) Ambulation/Gait Assistance: 3: Mod assist, 4: Min assist Locomotion: Ambulation: 3: Travels 150 ft or more with moderate assistance (Pt: 50 - 74%)  Comprehension Comprehension Mode: Auditory Comprehension: 4-Understands basic 75 - 89% of the time/requires cueing 10 - 24% of the time  Expression Expression Mode: Verbal Expression: 5-Expresses basic 90% of the time/requires cueing < 10% of the time.  Social Interaction Social Interaction: 3-Interacts appropriately 50 - 74% of the time - May be physically or verbally inappropriate.  Problem Solving Problem Solving: 3-Solves basic 50 - 74% of the time/requires cueing 25 - 49% of the time  Memory Memory: 2-Recognizes or recalls 25 - 49% of the time/requires cueing 51 - 75% of the time  Medical Problem List and Plan: 1. Functional  deficits secondary to left PICA infarct with petechial hemorrhage 2.  DVT Prophylaxis/Anticoagulation: Subcutaneous Lovenox. Will d/c as pt amb >100' 3. Pain Management/back pain due to osteoporosis: Percocet 1 tablet every 6 hours as needed as prior to admission. Monitor mental status 4. Mood/dementia: Discussed baseline cognition with family, may have depression as well, neuropsych eval 5. Neuropsych: This patient is capable of making decisions on her own behalf. 6. Skin/Wound Care: Routine skin checks 7. Fluids/Electrolytes/Nutrition/hypokalemia: Strict I know follow-up chemistries 8. Hypertension. Norvasc 5 mg daily, Avapro 300 mg daily.Monitor with increased mobility, low BP poor intake yest, check BMET, may need to adjust meds 9. Hyperlipidemia. Lipitor 10. Refractory nausea/dizziness. PRN antivert, scheduled scopolamine patch. Vestibular evaluation to be completed 11. Dysphagia. Dysphagia 3 thin liquid diet. Follow speech therapy 12.  HypoK will replete po , repeat BMET with normal K, cont current KCl dose 13.  UTI start empiric abx LOS (Days) 7 A FACE TO FACE EVALUATION WAS PERFORMED  Jawara Latorre E 02/16/2014, 7:36 AM

## 2014-02-16 NOTE — Progress Notes (Signed)
Social Work Patient ID: [name], female   DOB: 1933/11/03, 81 y.o.   MRN: [id number] Spoke with daughter via telephone to discuss team conference goals-supervision/min assist and discharge 2/20.  She is meeting with [facility] and will ask About how much care they can provide.  She asked if pt could stay lone for two hours informed her the recommendation from the team is 24 hr care due to memory and Balance issues.  She would not be safe alone.  Discussed home health services and this would be recommended and neuro-psych to see tomorrow.  Continue to work on discharge plans.

## 2014-02-17 ENCOUNTER — Inpatient Hospital Stay (HOSPITAL_COMMUNITY): Payer: Medicare Other | Admitting: Speech Pathology

## 2014-02-17 ENCOUNTER — Inpatient Hospital Stay (HOSPITAL_COMMUNITY): Payer: Medicare Other | Admitting: Physical Therapy

## 2014-02-17 ENCOUNTER — Encounter (HOSPITAL_COMMUNITY): Payer: Medicare Other | Admitting: Occupational Therapy

## 2014-02-17 ENCOUNTER — Encounter (HOSPITAL_COMMUNITY): Payer: Medicare Other

## 2014-02-17 NOTE — Progress Notes (Signed)
Speech Language Pathology Weekly Progress and Session Note  Patient Details  Name: [name] MRN: [id number] Date of Birth: October 31, 1933  Beginning of progress report period: February 10, 2014 End of progress report period: February 17, 2014  Today's Date: 02/17/2014 SLP Individual Time: 8032-1224 SLP Individual Time Calculation (min): 60 min  Short Term Goals: Week 1: SLP Short Term Goal 1 (Week 1): Pt will tolerate presentations of her currently prescribed diet with no overt s/s of aspiration and supervision cues for use of swallowing precautions.   SLP Short Term Goal 1 - Progress (Week 1): Met SLP Short Term Goal 2 (Week 1): Pt will tolerate trials of advanced consistencies with supervision cues to monitor and correct pocketing.   SLP Short Term Goal 2 - Progress (Week 1): Met SLP Short Term Goal 3 (Week 1): Pt will improve use of compensatory aids for memory to facilitate improved recall of daily information with min assist.  SLP Short Term Goal 3 - Progress (Week 1): Progressing toward goal    New Short Term Goals: Week 2: SLP Short Term Goal 1 (Week 2): Pt will tolerate regular solids and thin liquids with mod I use of swallowing precautions.   SLP Short Term Goal 2 (Week 2): Pt will improve use of compensatory aids for memory to facilitate improved recall of daily information with min assist.  SLP Short Term Goal 3 (Week 2): Pt will improve functional problem solving over 75% of observable opportunities during structured therapeutic tasks with mod assist SLP Short Term Goal 4 (Week 2): Pt will improve safety awareness during funtctional tasks over 75% of observable opportunities with min assist.    Weekly Progress Updates:  Pt made functional gains this reporting period and has met 2 out of 3 short term goals.  Pt has made improvements in her mastication of regular textures and is tolerating her currently prescribed diet with intermittent supervision for use of swallowing  precautions.  Pt currently requires overall mod assist for basic cognitive tasks due to decreased working memory and decreased planning, organization, and error awareness for functional problem solving; however, her mentation has been clearing over the last 2 consecutive sessions.  Pt would continue to benefit from skilled ST while inpatient in order to maximize functional independence and reduce burden of care prior to discharge.  Anticipate that pt will need 24/7 supervision, assistance for medication and financial management, and SLP follow up at discharge.  Pt and family education is ongoing.     Intensity: Minumum of 1-2 x/day, 30 to 90 minutes Frequency: 3 to 5 out of 7 days Duration/Length of Stay: 7-10 days  Treatment/Interventions: Dysphagia/aspiration precaution training;Patient/family education;Functional tasks;Internal/external aids;Environmental controls;Cueing hierarchy;Cognitive remediation/compensation   Daily Session  Skilled Therapeutic Interventions: Pt was seen for skilled ST targeting cognitive goals.  Upon arrival, pt was seated upright in wheelchair, awake, alert, and agreeable to participate in [place].  Pt propelled herself in the wheelchair to the ST treatment room with min cues for safety when navigating around obstacles.  SLP facilitated the session with a structured grocery planning task targeting organization and working memory.  Pt sorted a list of grocery items into categories with max faded to mod assist multimodal cues for organization.  Pt then located targeted items in a grocery flyer and recorded prices of items with max assist cues.  Pt required max assist for route recall from the treatment room to her room.  Pt presented with complaints of back pain by the end of the session  which SLP suspects contributed to difficulty during the abovementioned task.  Pain resolved when returned to bed.  Goals updated on this date to reflect current progress and plan of care.        FIM:  Comprehension Comprehension Mode: Auditory Comprehension: 5-Follows basic conversation/direction: With no assist Expression Expression Mode: Verbal Expression: 5-Expresses basic needs/ideas: With extra time/assistive device Social Interaction Social Interaction: 5-Interacts appropriately 90% of the time - Needs monitoring or encouragement for participation or interaction. Problem Solving Problem Solving: 4-Solves basic 75 - 89% of the time/requires cueing 10 - 24% of the time Memory Memory: 4-Recognizes or recalls 75 - 89% of the time/requires cueing 10 - 24% of the time  Pain Pain Assessment Pain Assessment: No/denies pain  Therapy/Group: Individual Therapy  Arby Dahir, Selinda Orion 02/17/2014, 3:01 PM

## 2014-02-17 NOTE — Progress Notes (Signed)
Social Work Patient ID: [name], female   DOB: 1933/08/07, 81 y.o.   MRN: [id number] [place] Medicare has approved pt through 2/19.  Will need update if stays longer than 2/20.

## 2014-02-17 NOTE — Progress Notes (Signed)
Physical Therapy Session Note  Patient Details  Name: [name] MRN: [id number] Date of Birth: August 22, 1933  Today's Date: 02/17/2014 PT Individual Time: 8206-0156 PT Individual Time Calculation (min): 63 min   Short Term Goals: Week 1:  PT Short Term Goal 1 (Week 1): Pt will peform supine>sit with Max A with HOB flat using bed rails with 75% cueing. PT Short Term Goal 2 (Week 1): Pt will perform sit > supine with HOB flat, Min A, and 25% cueing. PT Short Term Goal 3 (Week 1): Pt will consistently transfe from bed<>w/c with mod A and 50% cueing. PT Short Term Goal 4 (Week 1): Pt will ambulate x30' with LRAD, Max A of single therapist, and 75% cueing. PT Short Term Goal 5 (Week 1): Pt will negotiate 5 stairs with 2 rails wth Max A of single therapist and 75% cueing.  Skilled Therapeutic Interventions/Progress Updates:    Session focused on increasing gait stability, postural awareness/control, cognitive remediation, and dual tasking with mobility. Pt performed functional ambulation x175' in controlled and home environments without assistive device requiring min guard to mod A to recover from LOB (most frequently to L side) with turning, quick stopping/starting ambulation. Pt engaged in cognitive task involving searching for brightly colored sticky notes containing letters in alphabetic order from A-G. Letters placed on walls at eye-level to promote upright posture, forward gaze during ambulation. Activity focused on dual tasking, working memory, safety awareness, and gait stability. Pt required able to locate all letters with min cueing to remind pt of order, max cueing for safe technique while turning. After seated rest break, transitioned to activity involving pt searching for numbers and letters in order from A1-F6 with only min cueing for safety awareness. In quiet environment, pt required only min cueing, increased time for sequence of numbers/letters; however, when other staff/pts arrived to  gym, pt required max to total cueing to locate numbers. Pt expressing urgent need to have BM. Therefore, ambulated back to pt room with min A for stability, mod cueing for upright posture/forward gaze. Pt required min A for toilet transfer using grab bar. Departed pt room with pt seated in w/c with quick release belt in place for safety and all needs within reach.  Pt continues to demonstrate daily improvement in sustained attention, problem solving, safety awareness, and stability with functional mobility.   Therapy Documentation Precautions:  Precautions Precautions: Fall Restrictions Weight Bearing Restrictions: No Vital Signs: Therapy Vitals BP: (!) 118/59 mmHg Pain: Pain Assessment Pain Assessment: No/denies pain Locomotion : Ambulation Ambulation/Gait Assistance: 4: Min assist;4: Min guard;3: Mod assist   See FIM for current functional status  Therapy/Group: Individual Therapy  Daanish Copes, Malva Cogan 02/17/2014, 11:43 AM

## 2014-02-17 NOTE — Progress Notes (Signed)
Occupational Therapy Weekly Progress Note  Patient Details  Name: [name] MRN: [id number] Date of Birth: 10-Aug-1933  Beginning of progress report period: February 10, 2014 End of progress report period: February 17, 2014  Today's Date: 02/17/2014 OT Individual Time: 2224-1146 OT Individual Time Calculation (min): 60 min    Patient has met 5 of 5 short term goals.  Pt is making steady progress towards goals.  Pt is demonstrating increased orientation and activity tolerance.  Pt with no c/o dizziness or nausea during treatment sessions.  Pt is a consistent min assist progressing to close supervision with transfers and self-care tasks at a sit > stand level.  Pt continues to require verbal cues for sequencing and safety.    Patient continues to demonstrate the following deficits: cognitive deficits, impaired memory, sequencing, safety awareness, decreased standing balance and balance reactions, decreased activity tolerance and therefore will continue to benefit from skilled OT intervention to enhance overall performance with BADL and Reduce care partner burden.  Patient progressing toward long term goals..  Continue plan of care.  OT Short Term Goals Week 1:  OT Short Term Goal 1 (Week 1): Pt will complete bathing at sit > stand level with min assist OT Short Term Goal 1 - Progress (Week 1): Met OT Short Term Goal 2 (Week 1): Pt will complete LB dressing at sit > stand level with mod assist OT Short Term Goal 2 - Progress (Week 1): Met OT Short Term Goal 3 (Week 1): Pt will complete UB dressing with min assist OT Short Term Goal 3 - Progress (Week 1): Met OT Short Term Goal 4 (Week 1): Pt will complete toilet transfer with min assist OT Short Term Goal 4 - Progress (Week 1): Met OT Short Term Goal 5 (Week 1): Pt will complete 2 grooming tasks in standing with min assist OT Short Term Goal 5 - Progress (Week 1): Met Week 2:  OT Short Term Goal 1 (Week 2): STG = LTGs due to remaining  LOS  Skilled Therapeutic Interventions/Progress Updates:    Engaged in ADL retraining with focus on functional transfers, sit > stand, and increased safety awareness during self-care tasks.  Pt required increased time to arouse and engage in session as pt initially attempting to refuse.  Stand pivot transfer bed > w/c with supervision this session with pt demonstrating increased sequencing and trunk control.  Bathing and dressing completed at sit > stand level at sink with occasional steady assist as pt attempting to wash BLE and don pants in standing, educated on sitting to increase safety. Pt able to don TEDS this session, setup for breakfast with therapist opening milk container.  Therapy Documentation Precautions:  Precautions Precautions: Fall Restrictions Weight Bearing Restrictions: No Pain: Pain Assessment Pain Assessment: No/denies pain  See FIM for current functional status  Therapy/Group: Individual Therapy  Simonne Come 02/17/2014, 12:53 PM

## 2014-02-17 NOTE — Progress Notes (Signed)
Subjective/Complaints:   Review of Systems - Negative except had 2 BMs today, limited by cognition Objective: Vital Signs: Blood pressure 109/51, pulse 80, temperature 98.1 F (36.7 C), temperature source Oral, resp. rate 16, height 5' 4"  (1.626 m), weight 60.5 kg (133 lb 6.1 oz), SpO2 96 %. No results found. Results for orders placed or performed during the hospital encounter of 02/09/14 (from the past 72 hour(s))  Basic metabolic panel     Status: Abnormal   Collection Time: 02/15/14  6:25 AM  Result Value Ref Range   Sodium 138 135 - 145 mmol/L   Potassium 3.7 3.5 - 5.1 mmol/L   Chloride 102 96 - 112 mmol/L   CO2 30 19 - 32 mmol/L   Glucose, Bld 101 (H) 70 - 99 mg/dL   BUN <5 (L) 6 - 23 mg/dL   Creatinine, Ser 0.98 0.50 - 1.10 mg/dL   Calcium 9.1 8.4 - 10.5 mg/dL   GFR calc non Af Amer 53 (L) >90 mL/min   GFR calc Af Amer 62 (L) >90 mL/min    Comment: (NOTE) The eGFR has been calculated using the CKD EPI equation. This calculation has not been validated in all clinical situations. eGFR's persistently <90 mL/min signify possible Chronic Kidney Disease.    Anion gap 6 5 - 15     HEENT: normal Cardio: RRR and no murmur Resp: CTA B/L and unlabored GI: BS positive and NT, ND Extremity:  Pulses positive and No Edema Skin:   Intact, central line site without drainage Neuro: Confused, Cranial Nerve II-XII normal, Normal Sensory, Normal Motor and Abnormal FMC-LUE Ataxic/ dec FMC, Motor 5/5 in BUE and 4/5 in BLE Musc/Skel:  Normal GEN NAD   Assessment/Plan: 1. 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 Physiatrist is providing close team supervision and 24 hour management of active medical problems listed below. Physiatrist and rehab team continue to assess barriers to discharge/monitor patient progress toward functional and medical goals.   FIM: FIM -  Bathing Bathing Steps Patient Completed: Chest, Right Arm, Left Arm, Abdomen, Front perineal area, Right upper leg, Left upper leg, Buttocks, Right lower leg (including foot), Left lower leg (including foot) Bathing: 4: Steadying assist  FIM - Upper Body Dressing/Undressing Upper body dressing/undressing steps patient completed: Thread/unthread right bra strap, Thread/unthread left bra strap, Hook/unhook bra, Thread/unthread right sleeve of pullover shirt/dresss, Thread/unthread left sleeve of pullover shirt/dress, Put head through opening of pull over shirt/dress, Pull shirt over trunk Upper body dressing/undressing: 5: Set-up assist to: Obtain clothing/put away FIM - Lower Body Dressing/Undressing Lower body dressing/undressing steps patient completed: Thread/unthread right underwear leg, Thread/unthread left underwear leg, Pull underwear up/down, Thread/unthread right pants leg, Thread/unthread left pants leg, Pull pants up/down, Don/Doff right shoe, Don/Doff left shoe, Fasten/unfasten right shoe, Fasten/unfasten left shoe Lower body dressing/undressing: 4: Steadying Assist  FIM - Toileting Toileting steps completed by patient: Adjust clothing prior to toileting, Performs perineal hygiene, Adjust clothing after toileting Toileting Assistive Devices: Grab bar or rail for support Toileting: 4: Steadying assist  FIM - Radio producer Devices: Grab bars Toilet Transfers: 4-To toilet/BSC: Min A (steadying Pt. > 75%), 4-From toilet/BSC: Min A (steadying Pt. > 75%)  FIM - Bed/Chair Transfer Bed/Chair Transfer Assistive Devices: Arm rests Bed/Chair Transfer: 4: Bed > Chair or W/C: Min A (steadying Pt. > 75%), 4: Chair or W/C > Bed: Min A (steadying Pt. > 75%)  FIM - Locomotion: Wheelchair Distance: 100 Locomotion: Wheelchair: 0: Activity did not occur FIM - Locomotion: Ambulation Locomotion: Ambulation Assistive Devices: Administrator Ambulation/Gait Assistance:  3: Mod assist, 4: Min assist Locomotion: Ambulation: 3: Travels 150 ft or more with moderate assistance (Pt: 50 - 74%)  Comprehension Comprehension Mode: Auditory Comprehension: 5-Follows basic conversation/direction: With no assist  Expression Expression Mode: Verbal Expression: 5-Expresses basic needs/ideas: With extra time/assistive device  Social Interaction Social Interaction: 5-Interacts appropriately 90% of the time - Needs monitoring or encouragement for participation or interaction.  Problem Solving Problem Solving: 3-Solves basic 50 - 74% of the time/requires cueing 25 - 49% of the time  Memory Memory: 3-Recognizes or recalls 50 - 74% of the time/requires cueing 25 - 49% of the time  Medical Problem List and Plan: 1. 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  DVT Prophylaxis/Anticoagulation: Subcutaneous Lovenox. Will d/c as pt amb >100' 3. Pain Management/back pain due to osteoporosis: Percocet 1 tablet every 6 hours as needed as prior to admission. Monitor mental status 4. Mood/dementia: Discussed baseline cognition with family, may have depression as well, neuropsych eval 5. Neuropsych: This patient is capable of making decisions on her own behalf. 6. Skin/Wound Care: Routine skin checks 7. Fluids/Electrolytes/Nutrition/hypokalemia: Strict I know follow-up chemistries 8. Hypertension. Norvasc 5 mg daily, Avapro 300 mg daily.Monitor with increased mobility, low BP poor intake yest, check BMET, may need to adjust meds 9. Hyperlipidemia. Lipitor 10. Refractory nausea/dizziness. PRN antivert,improved d/c scopolamine patch. Vestibular evaluation to be completed 11. Dysphagia. Dysphagia 3 thin liquid diet. Follow speech therapy 12.  HypoK will replete po , repeat BMET with normal K, cont current KCl dose 13.  UTI will d/c cipro LOS (Days) 8 A FACE TO FACE EVALUATION WAS PERFORMED  [name] 02/17/2014, 7:16 AM

## 2014-02-18 ENCOUNTER — Inpatient Hospital Stay (HOSPITAL_COMMUNITY): Payer: Medicare Other | Admitting: Occupational Therapy

## 2014-02-18 ENCOUNTER — Inpatient Hospital Stay (HOSPITAL_COMMUNITY): Payer: Medicare Other | Admitting: Speech Pathology

## 2014-02-18 ENCOUNTER — Inpatient Hospital Stay (HOSPITAL_COMMUNITY): Payer: Medicare Other | Admitting: Physical Therapy

## 2014-02-18 NOTE — Progress Notes (Signed)
Occupational Therapy Session Note  Patient Details  Name: [name] MRN: [id number] Date of Birth: 11-26-1933  Today's Date: 02/18/2014 OT Individual Time: 9449-6759 OT Individual Time Calculation (min): 60 min    Short Term Goals: Week 2:  OT Short Term Goal 1 (Week 2): STG = LTGs due to remaining LOS  Skilled Therapeutic Interventions/Progress Updates:    Engaged in ADL retraining with focus on functional transfers, safety with mobility, and grooming tasks in standing.  Pt seated at EOB visiting with son-in-law on arrival.  Report they removed scopolamine patch this AM and to monitor symptoms.  Pt requested to defer bathing to another day due to dry, itchy skin, also reports TEDS bothering BLE (alergic to latex) therefore did not don this AM and monitored BP during session.  Ambulated to ADL apt, completed tub/shower transfers with tub transfer bench and stepping over tub ledge.  Pt required min assist and use of grab bars when stepping over ledge, recommend tub transfer bench to increase safety and independence as pt supervision with this transfer.  BP assessed upon return to room WNL, engaged in grooming tasks in standing with no LOB.  Ambulated to bathroom with RW with min verbal cues for safety with RW and positioning of RW.  Pt returned to w/c at end of session and reports "that's a day's work".  Therapy Documentation Precautions:  Precautions Precautions: Fall Restrictions Weight Bearing Restrictions: No General:   Vital Signs: Therapy Vitals Pulse Rate: 81 BP: (!) 102/46 mmHg Patient Position (if appropriate): Lying (HOB 40 degrees) Oxygen Therapy SpO2: 98 % O2 Device: Not Delivered Pain: Pain Assessment Pain Assessment: No/denies pain  See FIM for current functional status  Therapy/Group: Individual Therapy  Simonne Come 02/18/2014, 10:41 AM

## 2014-02-18 NOTE — Progress Notes (Signed)
Speech Language Pathology Daily Session Note  Patient Details  Name: [name] MRN: [id number] Date of Birth: March 17, 1933  Today's Date: 02/18/2014 SLP Individual Time: 0076-2263 SLP Individual Time Calculation (min): 30 min  Short Term Goals: Week 2: SLP Short Term Goal 1 (Week 2): Pt will tolerate regular solids and thin liquids with mod I use of swallowing precautions.   SLP Short Term Goal 2 (Week 2): Pt will improve use of compensatory aids for memory to facilitate improved recall of daily information with min assist.  SLP Short Term Goal 3 (Week 2): Pt will improve functional problem solving over 75% of observable opportunities during structured therapeutic tasks with mod assist SLP Short Term Goal 4 (Week 2): Pt will improve safety awareness during funtctional tasks over 75% of observable opportunities with min assist.    Skilled Therapeutic Interventions: Skilled treatment session focused on addressing cognition goals.  SLP facilitated session with Max verbal and visual cues to recall yesterday's grocery list organizational activity and locate where she had left off as well as procedures for completing task.  However, SLP able to fade cues to Mod assist once patient was familiarized with task procedures.  SLP also facilitated session with discussion regarding current deficits and goals, which patient required Total assist due to decreased recall/disorientation to situation of CVA.  Patient was able to return demonstration of use of call bell and TV controllers at end of session.     FIM:  Comprehension Comprehension Mode: Auditory Comprehension: 5-Understands basic 90% of the time/requires cueing < 10% of the time Expression Expression Mode: Verbal Expression: 5-Expresses basic needs/ideas: With extra time/assistive device Social Interaction Social Interaction: 5-Interacts appropriately 90% of the time - Needs monitoring or encouragement for participation or interaction. Problem  Solving Problem Solving: 3-Solves basic 50 - 74% of the time/requires cueing 25 - 49% of the time Memory Memory: 3-Recognizes or recalls 50 - 74% of the time/requires cueing 25 - 49% of the time  Pain Pain Assessment Pain Assessment: No/denies pain  Therapy/Group: Individual Therapy  Carmelia Roller., [place] 02/18/2014, 4:17 PM

## 2014-02-18 NOTE — Consult Note (Signed)
NEUROCOGNITIVE [place]   Ms. [name] is a 81 year old, right-handed woman, who was seen for a brief neurocognitive status examination to evaluate her emotional state and mental status in the setting of stroke.  According to her medical record, she was admitted on 01/31/14 with nausea, vomiting, dizziness, and unsteady gait.  MRI demonstrated acute left posterior inferior cerebellar artery territory infarct with petechial hemorrhage.  She did not receive TPA.  Follow-up CT of the head performed on 02/07/14 revealed stable appearance with slight improvement of ventriculomelagy.    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  She denied major symptoms of depression, including suicidal ideation, but discussed challenges with inability to be independent in this current situation.  She stated that she copes with her frustrations by trying to be independent when she can and by distracting herself, though she mentioned that she has had trouble finding things to distract her while here.  [name] denied having major struggles or concerns and generally stated, "Everything seems to be going pretty smooth."  Her responses to self-report measures of mood symptoms were suggestive of the presence of mild depression and anxiety at this time.    Mental Status:  [name] total score on an overall measure of mental status was suggestive of marked cognitive disruption, at the level of dementia (MoCA = 16/30).  She was fully oriented, but struggled with subtests requiring memory, and executive functioning.  Subjectively, she denied noticing cognitive changes since her stroke, but she said that prior to the stroke, she had noticed worsening memory.  Of note, she purportedly had a diagnosis of dementia prior to the current stroke, but no formal  neuropsychological testing had been completed.     Impressions and Recommendations:  [name] overall neurocognitive profile was suggestive of cognitive disruption, at the level of dementia.  Although it is difficult to discern the exact etiology of her deficits based solely on a cognitive screening measure, the pattern of her scores was more suggestive of cognitive disruption secondary to cerebrovascular compromise than to a neurodegenerative disease process (e.g. Alzheimer's disease).  Still, it is recommended that she complete more comprehensive neuropsychological evaluation as an outpatient to better determine the nature and severity of cognitive inefficiencies and to further assist with diagnostic clarification.  Information on a neuropsychologist in her area should be included in her discharge paperwork for that purpose.  From an emotional standpoint, [name] seems to be experiencing mild depression and anxiety, which could be exacerbating cognitive problems, but which are not likely the driving forces behind them.  Given the mild nature of her mood disruption, medication adjustments are not likely necessary.  However, she may benefit from engaging in supportive individual psychotherapy.  Information on providers in her area should be included with her discharge paperwork. In addition, continued follow-up for support should be implemented during her inpatient stay.    DIAGNOSES: CVA Unspecified mixed anxiety and depressive disorder  Marlane Hatcher, Psy.D.  Clinical Neuropsychologist

## 2014-02-18 NOTE — Progress Notes (Signed)
Physical Therapy Weekly Progress Note  Patient Details  Name: [name] MRN: [id number] Date of Birth: September 26, 1933  Beginning of progress report period: February 10, 2014 End of progress report period: February 18, 2014  Today's Date: 02/18/2014 PT Individual Time: 0930-1030 PT Individual Time Calculation (min): 60 min   Patient has met 5 of 5 short term goals.  Since PT evaluation, pt has demonstrated improvement in orientation, sustained attention, safety awareness, and stability with functional mobility. Overall, pt currently requires min guard to min A with mobility. Pt progress is currently limited by orthostatic hypotension.  Patient continues to demonstrate the following deficits: unbalanced muscle activation, ataxia and decreased coordination, decreased visual perceptual skills, decreased midline orientation, decreased attention, decreased problem solving, decreased safety awareness and decreased memory, central origin and decreased standing balance, decreased postural control and decreased balance strategies and therefore will continue to benefit from skilled PT intervention to enhance overall performance with activity tolerance, balance, postural control, ability to compensate for deficits, attention, awareness and coordination.  Patient progressing toward long term goals..  Continue plan of care.  PT Short Term Goals Week 1:  PT Short Term Goal 1 (Week 1): Pt will peform supine>sit with Max A with HOB flat using bed rails with 75% cueing. PT Short Term Goal 1 - Progress (Week 1): Met PT Short Term Goal 2 (Week 1): Pt will perform sit > supine with HOB flat, Min A, and 25% cueing. PT Short Term Goal 2 - Progress (Week 1): Met PT Short Term Goal 3 (Week 1): Pt will consistently transfe from bed<>w/c with mod A and 50% cueing. PT Short Term Goal 3 - Progress (Week 1): Met PT Short Term Goal 4 (Week 1): Pt will ambulate x30' with LRAD, Max A of single therapist, and 75% cueing. PT  Short Term Goal 4 - Progress (Week 1): Met PT Short Term Goal 5 (Week 1): Pt will negotiate 5 stairs with 2 rails wth Max A of single therapist and 75% cueing. PT Short Term Goal 5 - Progress (Week 1): Met Week 2:  PT Short Term Goal 1 (Week 2): STG's = LTG's secondary to anticipated LOS.  Skilled Therapeutic Interventions/Progress Updates:    Pt received seated in w/c; agreeable to therapy. Session focused on increasing safety awareness, gait stability, independence with community mobility. Per OT, Ted hose not donned this AM secondary to LE itching, which is may have been caused by material.  Pt ambulated 200' x2 trials in community environment without assistive device requiring min guard to min A for stability, mod verbal/tactile cueing for upright posture, forward gaze. In hospital gift shop, pt exhibited effective obstacle negotiation but required cueing to widen BOS, decrease gait speed for safety. Pt required min A to ensure safe positioning/proximity prior to initiating stand > sit onto arm chair in hospital lobby. Return to rehab unit, where pt ambulated to rehab apartment with min guard then transferred to/from couch with min guard.   Noted onset of decreased arousal while seated on couch. Seated vitals: BP 95/50, HR 79, SpO2 98%. Standing vitals: BP 70/37, HR 85, SpO2 98% and symptomatic. Transported pt back to room and assisted back to bed. RN notified. Vitals (supine with bilat LE's elevated): BP 116/45. Pt reporting only having had coffee to drink today. Reiterated importance of adequate hydration (with non-caffeinated drinks) for managing orthostatic hypotension. No further education provided, as pt not understanding at this time due to decreased arousal. Will plan to educate family when available. Departed with  pt semi reclined in bed with 3 rails up, bed alarm on, vitals stable, and all needs within reach.  Therapy Documentation Precautions:  Precautions Precautions:  Fall Restrictions Weight Bearing Restrictions: No Vital Signs: Therapy Vitals Pulse Rate: 81 BP: (!) 102/46 mmHg Patient Position (if appropriate): Lying (HOB 40 degrees) Oxygen Therapy SpO2: 98 % O2 Device: Not Delivered Pain: Pain Assessment Pain Assessment: No/denies pain Locomotion : Ambulation Ambulation/Gait Assistance: 4: Min guard;4: Min assist   See FIM for current functional status  Therapy/Group: Individual Therapy  [place], Malva Cogan 02/18/2014, 2:15 PM

## 2014-02-18 NOTE — Progress Notes (Signed)
Occupational Therapy Session Note  Patient Details  Name: [name] MRN: [id number] Date of Birth: Feb 16, 1933  Today's Date: 02/18/2014 OT Individual Time: 3557-3220 OT Individual Time Calculation (min): 30 min    Short Term Goals: Week 2:  OT Short Term Goal 1 (Week 2): STG = LTGs due to remaining LOS  Skilled Therapeutic Interventions/Progress Updates:  Upon entering the room, pt supine in bed with no c/o pain. Pt supine >sit with supervision. Skilled OT session with focus on functional ambulation, functional transfer, toileting, and pt education. Pt ambulated with hand held assist to bathroom with steady assist for toilet transfer with use of grab bars. Pt required steady assist during clothing management and hygiene. Pt BP taken several times during session and remaining WNLs for participation in session. Pt ambulating ~ 100 feet to ADL apartment with use of RW and steady assist. Once in apartment, OT educated pt on simulated tub/shower transfer with shower chair. Pt utilizing grab bar and steady assist to enter tub and having 1 LOB with Mod A to correct when exiting. Pt stating, "I guess this isn't a good idea." OT recommends use of TTB vs shower chair for safety at home. Pt ambulating back to room and seated in wheelchair with QRB donned. Call bell and all needed items within reach.   Therapy Documentation Precautions:  Precautions Precautions: Fall Restrictions Weight Bearing Restrictions: No Pain: Pain Assessment Pain Assessment: No/denies pain  See FIM for current functional status  Therapy/Group: Individual Therapy  Phineas Semen 02/18/2014, 11:59 AM

## 2014-02-18 NOTE — Progress Notes (Signed)
Subjective/Complaints: Denies Nausea or vomiting  Review of Systems - Negative except tired Objective: Vital Signs: Blood pressure 119/53, pulse 78, temperature 98.2 F (36.8 C), temperature source Oral, resp. rate 17, height 5\' 4"  (1.626 m), weight 60.5 kg (133 lb 6.1 oz), SpO2 99 %. No results found. No results found for this or any previous visit (from the past 72 hour(s)).   HEENT: normal Cardio: RRR and no murmur Resp: CTA B/L and unlabored GI: BS positive and NT, ND Extremity:  Pulses positive and No Edema Skin:   Intact,  Neuro:oriented to person and place, recall is 2/3 words after 29min delay, Cranial Nerve II-XII normal, Normal Sensory, Normal Motor and Abnormal FMC-LUE Ataxic/ dec FMC, Motor 5/5 in BUE and 4/5 in BLE Musc/Skel:  Normal GEN NAD   Assessment/Plan: 1. 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 Physiatrist is providing close team supervision and 24 hour management of active medical problems listed below. Physiatrist and rehab team continue to assess barriers to discharge/monitor patient progress toward functional and medical goals.   FIM: FIM - Bathing Bathing Steps Patient Completed: Chest, Right Arm, Left Arm, Abdomen, Front perineal area, Right upper leg, Left upper leg, Buttocks, Right lower leg (including foot), Left lower leg (including foot) Bathing: 4: Steadying assist  FIM - Upper Body Dressing/Undressing Upper body dressing/undressing steps patient completed: Thread/unthread right bra strap, Thread/unthread left bra strap, Hook/unhook bra, Thread/unthread right sleeve of pullover shirt/dresss, Thread/unthread left sleeve of pullover shirt/dress, Put head through opening of pull over shirt/dress, Pull shirt over trunk Upper body dressing/undressing: 5: Set-up assist to: Obtain clothing/put away FIM - Lower Body  Dressing/Undressing Lower body dressing/undressing steps patient completed: Thread/unthread right underwear leg, Thread/unthread left underwear leg, Pull underwear up/down, Thread/unthread right pants leg, Thread/unthread left pants leg, Pull pants up/down, Don/Doff right shoe, Don/Doff left shoe, Fasten/unfasten right shoe, Fasten/unfasten left shoe Lower body dressing/undressing: 4: Steadying Assist  FIM - Toileting Toileting steps completed by patient: Adjust clothing prior to toileting, Performs perineal hygiene, Adjust clothing after toileting Toileting Assistive Devices: Grab bar or rail for support Toileting: 5: Supervision: Safety issues/verbal cues  FIM - Radio producer Devices: Grab bars Toilet Transfers: 4-To toilet/BSC: Min A (steadying Pt. > 75%)  FIM - Bed/Chair Transfer Bed/Chair Transfer Assistive Devices: Arm rests Bed/Chair Transfer: 4: Sit > Supine: Min A (steadying pt. > 75%/lift 1 leg)  FIM - Locomotion: Wheelchair Distance: 100 Locomotion: Wheelchair: 0: Activity did not occur FIM - Locomotion: Ambulation Locomotion: Ambulation Assistive Devices: Other (comment) (none) Ambulation/Gait Assistance: 4: Min assist, 4: Min guard, 3: Mod assist Locomotion: Ambulation: 3: Travels 150 ft or more with moderate assistance (Pt: 50 - 74%)  Comprehension Comprehension Mode: Auditory Comprehension: 5-Understands basic 90% of the time/requires cueing < 10% of the time  Expression Expression Mode: Verbal Expression: 5-Expresses basic needs/ideas: With extra time/assistive device  Social Interaction Social Interaction: 5-Interacts appropriately 90% of the time - Needs monitoring or encouragement for participation or interaction.  Problem Solving Problem Solving: 3-Solves basic 50 - 74% of the time/requires cueing 25 - 49% of the time  Memory Memory: 3-Recognizes or recalls 50 - 74% of the time/requires cueing 25 - 49% of the time  Medical  Problem List and Plan: 1. 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  DVT Prophylaxis/Anticoagulation: Subcutaneous Lovenox. Will d/c as pt amb >100' 3. Pain Management/back pain  due to osteoporosis: Percocet 1 tablet every 6 hours as needed as prior to admission. Monitor mental status 4. Mood/dementia: Discussed baseline cognition with family, may have depression as well, neuropsych eval 5. Neuropsych: This patient is capable of making decisions on her own behalf. 6. Skin/Wound Care: Routine skin checks 7. Fluids/Electrolytes/Nutrition/hypokalemia: Strict I know follow-up chemistries 8. Hypertension. Norvasc 5 mg daily, Avapro 300 mg daily.Monitor with increased mobility, low BP poor intake yest, check BMET, may need to adjust meds 9. Hyperlipidemia. Lipitor 10. Refractory nausea/dizziness. PRN antivert,improved d/c scopolamine patch. Vestibular evaluation to be completed 11. Dysphagia. Dysphagia 3 thin liquid diet. Follow speech therapy 12.  HypoK will replete po , repeat BMET with normal K, cont current KCl dose 13.  UTI will d/c cipro LOS (Days) 9 A FACE TO FACE EVALUATION WAS PERFORMED  [name] 02/18/2014, 7:23 AM

## 2014-02-18 NOTE — Progress Notes (Signed)
Social Work Patient ID: [name], female   DOB: [birth year], 81 y.o.   MRN: [id number] Team feels pt will reach her goals on Wed 2/17.  MD reports no medical issues, have contacted daughter to express team's input and she was agreeable and will be here on Monday to Learn her care and complete family education.  Will work on equipment and follow up therapies for Wed 2/17.

## 2014-02-19 ENCOUNTER — Inpatient Hospital Stay (HOSPITAL_COMMUNITY): Payer: Medicare Other | Admitting: Physical Therapy

## 2014-02-19 ENCOUNTER — Inpatient Hospital Stay (HOSPITAL_COMMUNITY): Payer: Medicare Other | Admitting: Occupational Therapy

## 2014-02-19 DIAGNOSIS — F0390 Unspecified dementia without behavioral disturbance: Secondary | ICD-10-CM

## 2014-02-19 NOTE — Progress Notes (Addendum)
Physical Therapy Session Note  Patient Details  Name: [name] MRN: [id number] Date of Birth: [birth year]  Today's Date: 02/19/2014 PT Individual Time: 6004-5997 PT Individual Time Calculation (min): 45 min   Short Term Goals: Week 2:  PT Short Term Goal 1 (Week 2): STG's = LTG's secondary to anticipated LOS.  Skilled Therapeutic Interventions/Progress Updates:    Pt received seated in w/c accompanied by OT. Pt agreeable to therapy. Session focused on increasing activity tolerance, gait training. Pt reporting itching in bilat LE's. Therefore, doffed Ted hose and donned ACE bandages on bilat LE for BP control without skin irritation. Ambulated x150' in controlled environment without assistive device requiring close supervision to min guard with cueing for upright posture, forward gaze, and reciprocal arm swing. During final 30' of ambulation, pt with onset of gait instability. Reposition pt safely into seated. Seated BP: 91/76; standing BP 103/49 and symptomatic. Due to symptomatic decrease in diastolic BP, performed NuStep x10.5 minutes for increased activity tolerance (while avoiding orthostatic hypotension) reciprocal UE/LE flexion/extension pattern to promote arm swing during ambulation. Resistance level 6 for initial 4 minutes, level 5 for final 6.5 minutes. Following NuStep, no significant change in BP, no onset of symptoms with transition from seated to standing. Ambulated back to room then to bathroom without assistive device with supervision to min guard, no overt LOB. Pt required close supervision with toilet transfers and basic transfers during this session. Session ended in pt room, where pt was left seated in w/c with son present and all needs within reach. Son, Shanon Brow, verbally agreed to don quick release belt for safety if leaving room.   Therapy Documentation Precautions:  Precautions Precautions: Fall Restrictions Weight Bearing Restrictions: No Vital Signs: Therapy  Vitals Pulse Rate: 82 BP: (!) 103/49 mmHg (symptomatic; ACE bandages on bilat LE's) Patient Position (if appropriate): Standing Pain: Pain Assessment Pain Assessment: No/denies pain Locomotion : Ambulation Ambulation/Gait Assistance: 4: Min guard;5: Supervision Wheelchair Mobility Distance: 150   See FIM for current functional status  Therapy/Group: Individual Therapy  Rahel Carlton, Malva Cogan 02/19/2014, 3:09 PM

## 2014-02-19 NOTE — Progress Notes (Signed)
Occupational Therapy Session Note  Patient Details  Name: [name] MRN: [id number] Date of Birth: [birth year]  Today's Date: 02/19/2014 OT Individual Time: 1300-1400 OT Individual Time Calculation (min): 60 min    Short Term Goals: Week 2:  OT Short Term Goal 1 (Week 2): STG = LTGs due to remaining LOS  Skilled Therapeutic Interventions/Progress Updates:    Engaged in therapeutic activities with focus on sitting and standing balance, as well as activity tolerance.  Had requested family be present to begin education this session, however nobody was present for this session.  Pt donned socks and completed grooming tasks at seated level.  Pt propelled w/c to therapy gym, engaged in standing activity at high-low table with focus on upright standing balance and sustained attention to task.  Introduced Academic librarian 4 and encouraged pt to focus on task, as she tends to be very quick with movements and decision making which can impact her safety.  Pt quick with activity, reporting errors afterwards and stating she did this before. Due to low BP, returned to room and encouraged pt to drink water.  Discussed safety with mobility and transfers upon d/c home during rest break.  Pt returned to therapy gym and completed standing task then passed off to PT.  Therapy Documentation Precautions:  Precautions Precautions: Fall Restrictions Weight Bearing Restrictions: No General:   Vital Signs: Therapy Vitals Temp: 97.8 F (36.6 C) Temp Source: Oral Pulse Rate: 70 Resp: 16 BP: (!) 123/50 mmHg Patient Position (if appropriate): Sitting Oxygen Therapy SpO2: 100 % O2 Device: Not Delivered Pain: Pain Assessment Pain Assessment: No/denies pain  See FIM for current functional status  Therapy/Group: Individual Therapy  Simonne Come 02/19/2014, 3:15 PM

## 2014-02-19 NOTE — Progress Notes (Addendum)
Christella App is a 81 y.o. female May 09, 1933 [id number]  Subjective: No new complaints. No new problems. Slept well. Feeling OK.  Objective: Vital signs in last 24 hours: Temp:  [98.3 F (36.8 C)] 98.3 F (36.8 C) (02/13 0541) Pulse Rate:  [79-85] 83 (02/13 0541) Resp:  [17] 17 (02/13 0541) BP: (70-137)/(37-64) 108/64 mmHg (02/13 0808) SpO2:  [96 %-99 %] 96 % (02/13 0541) Weight change:  Last BM Date: 02/17/14  Intake/Output from previous day: 02/12 0701 - 02/13 0700 In: 1120 [P.O.:1120] Out: -  Last cbgs: CBG (last 3)  No results for input(s): GLUCAP in the last 72 hours.   Physical Exam General: No apparent distress   HEENT: not dry Lungs: Normal effort. Lungs clear to auscultation, no crackles or wheezes. Cardiovascular: Regular rate and rhythm, no edema Abdomen: S/NT/ND; BS(+) Musculoskeletal:  unchanged Neurological: No new neurological deficits Wounds: N/A    Skin: clear  Aging changes Mental state: Alert, cooperative    Lab Results: BMET    Component Value Date/Time   NA 138 02/15/2014 0625   K 3.7 02/15/2014 0625   CL 102 02/15/2014 0625   CO2 30 02/15/2014 0625   GLUCOSE 101* 02/15/2014 0625   BUN <5* 02/15/2014 0625   CREATININE 0.98 02/15/2014 0625   CALCIUM 9.1 02/15/2014 0625   GFRNONAA 53* 02/15/2014 0625   GFRAA 62* 02/15/2014 0625   CBC    Component Value Date/Time   WBC 9.8 02/10/2014 0500   RBC 3.29* 02/10/2014 0500   HGB 10.6* 02/10/2014 0500   HCT 29.8* 02/10/2014 0500   PLT 196 02/10/2014 0500   MCV 90.6 02/10/2014 0500   MCH 32.2 02/10/2014 0500   MCHC 35.6 02/10/2014 0500   RDW 12.2 02/10/2014 0500   LYMPHSABS 1.5 02/10/2014 0500   MONOABS 0.9 02/10/2014 0500   EOSABS 0.2 02/10/2014 0500   BASOSABS 0.0 02/10/2014 0500    Studies/Results: No results found.  Medications: I have reviewed the patient's current medications.  Assessment/Plan:  1. Functional deficits secondary to left PICA infarct with petechial  hemorrhage 2. DVT Prophylaxis/Anticoagulation: Subcutaneous Lovenox. Will d/c as pt amb >100' 3. Pain Management/back pain due to osteoporosis: Percocet 1 tablet every 6 hours as needed as prior to admission. Monitor mental status 4. Mood/dementia: Discussed baseline cognition with family, may have depression as well, neuropsych eval 5. Neuropsych: This patient is capable of making decisions on her own behalf. 6. Skin/Wound Care: Routine skin checks 7. Fluids/Electrolytes/Nutrition/hypokalemia: Strict I know follow-up chemistries 8. Hypertension. Norvasc 5 mg daily, Avapro 300 mg daily.Monitor with increased mobility, low BP poor intake yest, check BMET, may need to adjust meds 9. Hyperlipidemia. Lipitor 10. Refractory nausea/dizziness. PRN antivert,improved d/c scopolamine patch. Vestibular evaluation to be completed 11. Dysphagia. Dysphagia 3 thin liquid diet. Follow speech therapy 12. HypoK will replete po , repeat BMET with normal K, cont current KCl dose 13. UTI - off cipro    Length of stay, days: 10  Walker Kehr , MD 02/19/2014, 9:14 AM

## 2014-02-20 ENCOUNTER — Inpatient Hospital Stay (HOSPITAL_COMMUNITY): Payer: Medicare Other | Admitting: Physical Therapy

## 2014-02-20 DIAGNOSIS — G301 Alzheimer's disease with late onset: Secondary | ICD-10-CM

## 2014-02-20 NOTE — Progress Notes (Signed)
Physical Therapy Note  Patient Details  Name: [name] MRN: [id number] Date of Birth: 1933/11/14 Today's Date: 02/20/2014    Time: 900-957 57 minutes  1:1 no c/o pain.  Pt initially dizzy upon first getting OOB with Ted hose donned.  BP seated: 119/56, standing 97/52.  Car transfer to simulated SUV height with min A, daughter present to observe.  Educated Public relations account executive on safety with car transfers.  Curb negotiation with min-mod HHA, cues for slowing down.  Pt's daughter states she has 2 steps with no handrails to enter her home.  Practiced multiple attempts at stair negotiation without handrails with min-mod A, cues for safety.  Gait in controlled and home environments with close supervision/min A multiple attempts up to 150'.  nustep for increased activity tolerance level 4 x 5  Minutes.  Pt improved throughout session, no other occurences of low BP during treatment.   Jay Kempe 02/20/2014, 9:57 AM

## 2014-02-20 NOTE — Progress Notes (Signed)
Vonna Brabson is a 81 y.o. female 1933-12-02 [id number]  Subjective: No new complaints. No new problems. Slept well.  Objective: Vital signs in last 24 hours: Temp:  [97.8 F (36.6 C)-98.1 F (36.7 C)] 98.1 F (36.7 C) (02/14 0557) Pulse Rate:  [70-87] 81 (02/14 0557) Resp:  [16] 16 (02/14 0557) BP: (91-123)/(41-76) 120/41 mmHg (02/14 0557) SpO2:  [96 %-100 %] 96 % (02/14 0557) Weight change:  Last BM Date: 02/17/14  Intake/Output from previous day: 02/13 0701 - 02/14 0700 In: 720 [P.O.:720] Out: -  Last cbgs: CBG (last 3)  No results for input(s): GLUCAP in the last 72 hours.   Physical Exam General: No apparent distress   HEENT: not dry Lungs: Normal effort. Lungs clear to auscultation, no crackles or wheezes. Cardiovascular: Regular rate and rhythm, no edema Abdomen: S/NT/ND; BS(+) Musculoskeletal:  unchanged Neurological: No new neurological deficits Wounds: N/A    Skin: clear  Aging changes Mental state: Alert, cooperative    Lab Results: BMET    Component Value Date/Time   NA 138 02/15/2014 0625   K 3.7 02/15/2014 0625   CL 102 02/15/2014 0625   CO2 30 02/15/2014 0625   GLUCOSE 101* 02/15/2014 0625   BUN <5* 02/15/2014 0625   CREATININE 0.98 02/15/2014 0625   CALCIUM 9.1 02/15/2014 0625   GFRNONAA 53* 02/15/2014 0625   GFRAA 62* 02/15/2014 0625   CBC    Component Value Date/Time   WBC 9.8 02/10/2014 0500   RBC 3.29* 02/10/2014 0500   HGB 10.6* 02/10/2014 0500   HCT 29.8* 02/10/2014 0500   PLT 196 02/10/2014 0500   MCV 90.6 02/10/2014 0500   MCH 32.2 02/10/2014 0500   MCHC 35.6 02/10/2014 0500   RDW 12.2 02/10/2014 0500   LYMPHSABS 1.5 02/10/2014 0500   MONOABS 0.9 02/10/2014 0500   EOSABS 0.2 02/10/2014 0500   BASOSABS 0.0 02/10/2014 0500    Studies/Results: No results found.  Medications: I have reviewed the patient's current medications.  Assessment/Plan:  1. Functional deficits secondary to left PICA infarct with petechial  hemorrhage 2. DVT Prophylaxis/Anticoagulation: Subcutaneous Lovenox. Will d/c as pt amb >100' 3. Pain Management/back pain due to osteoporosis: Percocet 1 tablet every 6 hours as needed as prior to admission. Monitor mental status 4. Mood/dementia: Discussed baseline cognition with family, may have depression as well, neuropsych eval 5. Neuropsych: This patient is capable of making decisions on her own behalf. 6. Skin/Wound Care: Routine skin checks 7. Fluids/Electrolytes/Nutrition/hypokalemia: Strict I know follow-up chemistries 8. Hypertension. Norvasc 5 mg daily, Avapro 300 mg daily.Monitor with increased mobility, low BP poor intake yest, check BMET, may need to adjust meds 9. Hyperlipidemia. Lipitor 10. Refractory nausea/dizziness. PRN antivert,improved d/c scopolamine patch. Vestibular evaluation to be completed 11. Dysphagia. Dysphagia 3 thin liquid diet. Follow speech therapy 12. HypoK will replete po , repeat BMET with normal K, cont current KCl dose 13. UTI - off cipro  Cont w/current Rx    Length of stay, days: 11  Walker Kehr , MD 02/20/2014, 9:19 AM

## 2014-02-21 ENCOUNTER — Inpatient Hospital Stay (HOSPITAL_COMMUNITY): Payer: Medicare Other

## 2014-02-21 ENCOUNTER — Inpatient Hospital Stay (HOSPITAL_COMMUNITY): Payer: Medicare Other | Admitting: Speech Pathology

## 2014-02-21 ENCOUNTER — Inpatient Hospital Stay (HOSPITAL_COMMUNITY): Payer: Medicare Other | Admitting: Occupational Therapy

## 2014-02-21 MED ORDER — SENNOSIDES-DOCUSATE SODIUM 8.6-50 MG PO TABS
2.0000 | ORAL_TABLET | Freq: Two times a day (BID) | ORAL | Status: DC
  Administered 2014-02-21 – 2014-02-23 (×4): 2 via ORAL
  Filled 2014-02-21 (×5): qty 2

## 2014-02-21 NOTE — Progress Notes (Signed)
Speech Language Pathology Daily Session Note  Patient Details  Name: [name] MRN: [id number] Date of Birth: [birth year]  Today's Date: 02/21/2014 SLP Individual Time: 1100-1210 SLP Individual Time Calculation (min): 70 min  Short Term Goals: Week 2: SLP Short Term Goal 1 (Week 2): Pt will tolerate regular solids and thin liquids with mod I use of swallowing precautions.   SLP Short Term Goal 2 (Week 2): Pt will improve use of compensatory aids for memory to facilitate improved recall of daily information with min assist.  SLP Short Term Goal 3 (Week 2): Pt will improve functional problem solving over 75% of observable opportunities during structured therapeutic tasks with mod assist SLP Short Term Goal 4 (Week 2): Pt will improve safety awareness during funtctional tasks over 75% of observable opportunities with min assist.    Skilled Therapeutic Interventions:  Pt was seen for skilled ST targeting goals for cognition and family training.  Pt's daughter, son-in-law, and son were present for the duration of today's therapy session.  Pt recalled route from her room to the therapy room with min cues and demonstrated appropriate safety awareness during functional ambulation.  SLP facilitated the session with a basic medication management task targeting planning, organization, and error awareness.  Pt completed the abovementioned task with max instructional cues which SLP was able to fade to min assist-supervision with increased task familiarity.  SLP updated pt's family regarding pt's current progress in therapy and strongly recommended that pt have assistance for medication and financial management at discharge.   Pt's family was in agreement that pt has been clearing cognitively and are amenable to assisting pt with meds and finances.  Pt's family verbalized concerns that pt does not seem returned to her cognitive baseline yet and presented with questions regarding how to facilitate her cognitive  remediation/compensation.  SLP provided skilled education regarding compensatory strategies, specifically for memory emphasizing routines, organization, and use of written aids to facilitate recall of daily information.  SLP also provided skilled education related to activities to maximize cognitive reorganization in the home environment such as board games, card games, hobbies, and functional household tasks.  SLP will defer ST follow up recommendations to the family at this time, as SLP suspects that pt is near her cognitive baseline but family does not appear certain yet.  Daughter reports that she will think about it and have an answer ready by tomorrow.  CSW made aware.  Continue per current plan of care.   FIM:  Comprehension Comprehension Mode: Auditory Comprehension: 5-Follows basic conversation/direction: With extra time/assistive device Expression Expression Mode: Verbal Expression: 5-Expresses basic needs/ideas: With extra time/assistive device Social Interaction Social Interaction: 6-Interacts appropriately with others with medication or extra time (anti-anxiety, antidepressant). Problem Solving Problem Solving: 4-Solves basic 75 - 89% of the time/requires cueing 10 - 24% of the time Memory Memory: 3-Recognizes or recalls 50 - 74% of the time/requires cueing 25 - 49% of the time  Pain Pain Assessment Pain Assessment: No/denies pain  Therapy/Group: Individual Therapy  [name], Selinda Orion 02/21/2014, 1:06 PM

## 2014-02-21 NOTE — Plan of Care (Signed)
Problem: RH Memory Goal: LTG Patient will use memory compensatory aids to (SLP) LTG: Patient will use memory compensatory aids to recall biographical/new, daily complex information with cues (SLP)  Downgraded 02/21/2014  NP

## 2014-02-21 NOTE — Progress Notes (Signed)
Physical Therapy Session Note  Patient Details  Name: [name] MRN: [id number] Date of Birth: 1933-03-04  Today's Date: 02/21/2014 PT Individual Time: 0800-0900 PT Individual Time Calculation (min): 60 min   Short Term Goals: Week 2:  PT Short Term Goal 1 (Week 2): STG's = LTG's secondary to anticipated LOS.  Skilled Therapeutic Interventions/Progress Updates:  Patient resting in bed upon entering room. Treatment session focused on family education with daughter and son-in-law. Patient's daughter prefers patient use rolling walker due to fall risk and osteoporosis. Supine Bp 133/69. Sitting Bp 131/73. Patient supine to sit without assist. Patient donned shoes sitting EOB independently. Patient ambulated to bathroom and performed toileting tasks (pants down/up and hygiene) with supervision. Patient ambulated with and without rolling walker and close supervision multiple times throughout session on tile and carpeted surfaces 150+ feet. Patient exhibited more staggering gait without RW. Patient performed bed mobility on regular double bed without assist. Patient practiced sit to stand from various pieces of furniture to simulate home setting with supervision and verbal cues to use UE's to assist with sit <> stand. Patient ambulated up and down 2 steps with HHA from therapist, daughter, and son-in-law. All felt comfortable with accessing home. Patient performed car transfer with son-in-law's min assist. Patient performed simulated shower transfer stepping over into tub using grab bars and seat and min steady assist. Patient left in recliner with family present in room.     Therapy Documentation Precautions:  Precautions Precautions: Fall Restrictions Weight Bearing Restrictions: No Vital Signs: Therapy Vitals Temp: 98.3 F (36.8 C) Temp Source: Oral Pulse Rate: 78 Resp: 18 BP: 133/69 mmHg Patient Position (if appropriate): Lying Oxygen Therapy SpO2: 99 % Pain: Pain Assessment Pain  Assessment: No/denies pain Locomotion : Ambulation Ambulation/Gait Assistance: 5: Supervision   See FIM for current functional status  Therapy/Group: Individual Therapy  [name] 02/21/2014, 9:13 AM

## 2014-02-21 NOTE — Discharge Instructions (Signed)
Inpatient Rehab Discharge Instructions  [name] Discharge date and time: No discharge date for patient encounter.   Activities/Precautions/ Functional Status: Activity: activity as tolerated Diet: regular diet Wound Care: none needed Functional status:  ___ No restrictions     ___ Walk up steps independently _x STROKE/TIA DISCHARGE INSTRUCTIONS SMOKING Cigarette smoking nearly doubles your risk of having a stroke & is the single most alterable risk factor  If you smoke or have smoked in the last 12 months, you are advised to quit smoking for your health.  Most of the excess cardiovascular risk related to smoking disappears within a year of stopping.  Ask you doctor about anti-smoking medications  [place] Quit Line: 1-800-QUIT NOW  Free Smoking Cessation Classes [phone]  CHOLESTEROL Know your levels; limit fat & cholesterol in your diet  Lipid Panel     Component Value Date/Time   CHOL 126 02/01/2014 0310   TRIG 46 02/01/2014 0310   HDL 46 02/01/2014 0310   CHOLHDL 2.7 02/01/2014 0310   VLDL 9 02/01/2014 0310   LDLCALC 71 02/01/2014 0310      Many patients benefit from treatment even if their cholesterol is at goal.  Goal: Total Cholesterol (CHOL) less than 160  Goal:  Triglycerides (TRIG) less than 150  Goal:  HDL greater than 40  Goal:  LDL (LDLCALC) less than 100   BLOOD PRESSURE American Stroke Association blood pressure target is less that 120/80 mm/Hg  Your discharge blood pressure is:  BP: 133/69 mmHg  Monitor your blood pressure  Limit your salt and alcohol intake  Many individuals will require more than one medication for high blood pressure  DIABETES (A1c is a blood sugar average for last 3 months) Goal HGBA1c is under 7% (HBGA1c is blood sugar average for last 3 months)  Diabetes: No known diagnosis of diabetes    Lab Results  Component Value Date   HGBA1C 5.6 02/01/2014     Your HGBA1c can be lowered with medications, healthy diet, and  exercise.  Check your blood sugar as directed by your physician  Call your physician if you experience unexplained or low blood sugars.  PHYSICAL ACTIVITY/REHABILITATION Goal is 30 minutes at least 4 days per week  Activity: Increase activity slowly, Therapies: Physical Therapy: Home Health Return to work:   Activity decreases your risk of heart attack and stroke and makes your heart stronger.  It helps control your weight and blood pressure; helps you relax and can improve your mood.  Participate in a regular exercise program.  Talk with your doctor about the best form of exercise for you (dancing, walking, swimming, cycling).  DIET/WEIGHT Goal is to maintain a healthy weight  Your discharge diet is: Diet regular  liquids Your height is:  Height: 5\' 4"  (162.6 cm) Your current weight is: Weight: 60.5 kg (133 lb 6.1 oz) Your Body Mass Index (BMI) is:  BMI (Calculated): 22.9  Following the type of diet specifically designed for you will help prevent another stroke.  Your goal weight range is:    Your goal Body Mass Index (BMI) is 19-24.  Healthy food habits can help reduce 3 risk factors for stroke:  High cholesterol, hypertension, and excess weight.  RESOURCES Stroke/Support Group:  Call [phone]   STROKE EDUCATION PROVIDED/REVIEWED AND GIVEN TO PATIENT Stroke warning signs and symptoms How to activate emergency medical system (call 911). Medications prescribed at discharge. Need for follow-up after discharge. Personal risk factors for stroke. Pneumonia vaccine given:  Flu vaccine  given:  My questions have been answered, the writing is legible, and I understand these instructions.  I will adhere to these goals & educational materials that have been provided to me after my discharge from the hospital.    __ 24/7 supervision/assistance   ___ Walk up steps with assistance ___ Intermittent supervision/assistance  ___ Bathe/dress independently ___ Walk with walker     ___  Bathe/dress with assistance ___ Walk Independently    ___ Shower independently _x__ Walk with assistance    ___ Shower with assistance ___ No alcohol     ___ Return to work/school ________  Special Instructions:     COMMUNITY REFERRALS UPON DISCHARGE:    Home Health:   PT, OT, SP, [place] ZHYQM:[phone] Date of last service:02/23/2014  Medical Equipment/Items Ordered:[facility] [name], [place]    [phone] Other:LIST OF ASSISTED LIVING FACILITIES  GENERAL COMMUNITY RESOURCES FOR PATIENT/FAMILY: Support Groups:[facility] SUPPORT GROUP  My questions have been answered and I understand these instructions. I will adhere to these goals and the provided educational materials after my discharge from the hospital.  Patient/Caregiver Signature _______________________________ Date __________  Clinician Signature _______________________________________ Date __________  Please bring this form and your medication list with you to all your follow-up doctor's appointments.

## 2014-02-21 NOTE — Progress Notes (Signed)
Social Work Patient ID: [name], female   DOB: Dec 07, 1933, 81 y.o.   MRN: [id number] Met with pt, daughter and son to see how family education is going.  All report it went well and ready for discharge on Wed. Discussed follow up they prefer [facility], will make referral to agency.  Ordering equipment to be delivered to room today while Daughter is here.  Work toward discharge Wed.

## 2014-02-21 NOTE — Progress Notes (Signed)
Subjective/Complaints: No pain overnite, constipated but had this issue at home  Review of Systems - Negative except tired Objective: Vital Signs: Blood pressure 133/69, pulse 78, temperature 98.3 F (36.8 C), temperature source Oral, resp. rate 18, height 5\' 4"  (1.626 m), weight 60.5 kg (133 lb 6.1 oz), SpO2 99 %. No results found. No results found for this or any previous visit (from the past 72 hour(s)).   HEENT: normal Cardio: RRR and no murmur Resp: CTA B/L and unlabored GI: BS positive and NT, ND Extremity:  Pulses positive and No Edema Skin:   Intact,  Neuro:oriented to person and place, recall is 2/3 words after 50min delay, Cranial Nerve II-XII normal, Normal Sensory, Normal Motor and Abnormal FMC-LUE Ataxic/ dec FMC, Motor 5/5 in BUE and 4/5 in BLE Musc/Skel:  Normal GEN NAD   Assessment/Plan: 1. 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 Physiatrist is providing close team supervision and 24 hour management of active medical problems listed below. Physiatrist and rehab team continue to assess barriers to discharge/monitor patient progress toward functional and medical goals.   FIM: FIM - Bathing Bathing Steps Patient Completed: Chest, Right Arm, Left Arm, Abdomen, Front perineal area, Right upper leg, Left upper leg, Buttocks, Right lower leg (including foot), Left lower leg (including foot) Bathing: 4: Steadying assist  FIM - Upper Body Dressing/Undressing Upper body dressing/undressing steps patient completed: Thread/unthread right bra strap, Thread/unthread left bra strap, Hook/unhook bra, Thread/unthread right sleeve of pullover shirt/dresss, Thread/unthread left sleeve of pullover shirt/dress, Put head through opening of pull over shirt/dress, Pull shirt over trunk Upper body dressing/undressing: 5: Set-up assist to: Obtain clothing/put away FIM - Lower  Body Dressing/Undressing Lower body dressing/undressing steps patient completed: Thread/unthread right underwear leg, Thread/unthread left underwear leg, Pull underwear up/down, Thread/unthread right pants leg, Thread/unthread left pants leg, Pull pants up/down, Don/Doff right shoe, Don/Doff left shoe, Fasten/unfasten right shoe, Fasten/unfasten left shoe Lower body dressing/undressing: 4: Steadying Assist  FIM - Toileting Toileting steps completed by patient: Adjust clothing prior to toileting, Performs perineal hygiene, Adjust clothing after toileting Toileting Assistive Devices: Grab bar or rail for support Toileting: 5: Supervision: Safety issues/verbal cues  FIM - Radio producer Devices: Grab bars, Insurance account manager Transfers: 5-To toilet/BSC: Supervision (verbal cues/safety issues), 5-From toilet/BSC: Supervision (verbal cues/safety issues) (close supervision)  FIM - Control and instrumentation engineer Devices: Copy: 5: Bed > Chair or W/C: Supervision (verbal cues/safety issues), 5: Chair or W/C > Bed: Supervision (verbal cues/safety issues)  FIM - Locomotion: Wheelchair Distance: 150 Locomotion: Wheelchair: 5: Travels 150 ft or more: maneuvers on rugs and over door sills with supervision, cueing or coaxing FIM - Locomotion: Ambulation Locomotion: Ambulation Assistive Devices: Other (comment) Ambulation/Gait Assistance: 4: Min guard, 5: Supervision Locomotion: Ambulation: 4: Travels 150 ft or more with minimal assistance (Pt.>75%)  Comprehension Comprehension Mode: Auditory Comprehension: 5-Follows basic conversation/direction: With no assist  Expression Expression Mode: Verbal Expression: 5-Expresses basic 90% of the time/requires cueing < 10% of the time.  Social Interaction Social Interaction: 5-Interacts appropriately 90% of the time - Needs monitoring or encouragement for participation or interaction.  Problem  Solving Problem Solving: 4-Solves basic 75 - 89% of the time/requires cueing 10 - 24% of the time  Memory Memory: 4-Recognizes or recalls 75 - 89% of the time/requires cueing 10 - 24% of the time  Medical Problem List and Plan: 1. Functional deficits  secondary to left PICA infarct with petechial hemorrhage 2.  DVT Prophylaxis/Anticoagulation: ambulating >124ft, no hemiparesis 3. Pain Management/back pain due to osteoporosis: Percocet 1 tablet every 6 hours as needed as prior to admission. Monitor mental status 4. Mood/dementia: Discussed baseline cognition with family, may have depression as well, neuropsych eval 5. Neuropsych: This patient is capable of making decisions on her own behalf. 6. Skin/Wound Care: Routine skin checks 7. Fluids/Electrolytes/Nutrition/hypokalemia: Strict I know follow-up chemistries 8. Hypertension. Norvasc 5 mg daily, Avapro 300 mg daily.Monitor with increased mobility,     9. Hyperlipidemia. Lipitor 10. Refractory nausea/dizziness. PRN antivert,improved d/c scopolamine patch. Vestibular evaluation to be completed 11. Dysphagia. Dysphagia 3 thin liquid diet. Follow speech therapy 12.  HypoK will replete po , repeat BMET with normal K, cont current KCl dose 13.  Constipation Laxative LOS (Days) 12 A FACE TO FACE EVALUATION WAS PERFORMED  Chae Oommen E 02/21/2014, 8:07 AM

## 2014-02-21 NOTE — Progress Notes (Signed)
Occupational Therapy Session Note  Patient Details  Name: [name] MRN: [id number] Date of Birth: 06-24-1933  Today's Date: 02/21/2014 OT Individual Time: 1000-1100 OT Individual Time Calculation (min): 60 min    Short Term Goals: Week 1:  OT Short Term Goal 1 (Week 1): Pt will complete bathing at sit > stand level with min assist OT Short Term Goal 1 - Progress (Week 1): Met OT Short Term Goal 2 (Week 1): Pt will complete LB dressing at sit > stand level with mod assist OT Short Term Goal 2 - Progress (Week 1): Met OT Short Term Goal 3 (Week 1): Pt will complete UB dressing with min assist OT Short Term Goal 3 - Progress (Week 1): Met OT Short Term Goal 4 (Week 1): Pt will complete toilet transfer with min assist OT Short Term Goal 4 - Progress (Week 1): Met OT Short Term Goal 5 (Week 1): Pt will complete 2 grooming tasks in standing with min assist OT Short Term Goal 5 - Progress (Week 1): Met Week 2:  OT Short Term Goal 1 (Week 2): STG = LTGs due to remaining LOS      Skilled Therapeutic Interventions/Progress Updates:    Pt seen this session for family education with pt and her daughter [name] who is her main caregiver.  Discussed bathroom set up and options. Decided to try both options of tub transfers dry run in tub room and then begin her self care. Pt is able to step over tub wall with grab bars, but her daughter is most comfortable with the tub bench and increased safety with her not having to step over.  After pt undressed, she moved too quickly to turn around to tub bench and had a LOB to her R in which she needed steadying A to stabilize her balance. Pt was not phased by this, but her daughter was able to see how pt requires very close Supervision. Pt showered from bench and stood in shower with bars to wash LB. They will have bars installed at home.   She was very safe exiting shower and sat on a stool to dress herself. Pt was able to use toilet with RW with supervision.  Discussed general safety (bathmats, drying off in shower).  [name] cleared on safety plan to walk her mother to the bathroom. Pt then ambulated back to her room with close S.  Family with pt in the room.  Therapy Documentation Precautions:  Precautions Precautions: Fall Restrictions Weight Bearing Restrictions: No       Pain: Pain Assessment Pain Assessment: No/denies pain ADL:  See FIM for current functional status  Therapy/Group: Individual Therapy  Moore 02/21/2014, 1:11 PM

## 2014-02-22 ENCOUNTER — Inpatient Hospital Stay (HOSPITAL_COMMUNITY): Payer: Medicare Other | Admitting: Speech Pathology

## 2014-02-22 ENCOUNTER — Inpatient Hospital Stay (HOSPITAL_COMMUNITY): Payer: Medicare Other | Admitting: Occupational Therapy

## 2014-02-22 ENCOUNTER — Inpatient Hospital Stay (HOSPITAL_COMMUNITY): Payer: Medicare Other | Admitting: Physical Therapy

## 2014-02-22 NOTE — Progress Notes (Signed)
Occupational Therapy Discharge Summary  Patient Details  Name: [name] MRN: [id number] Date of Birth: Apr 20, 1933  Patient has met 10 of 10 long term goals due to improved activity tolerance, postural control, ability to compensate for deficits, improved attention and improved awareness.  Patient to discharge at overall Supervision level.  Patient's care partner is independent to provide the necessary cognitive assistance at discharge.  Patient's daughter, [name], present for family education and verbalized and demonstrated awareness of need for 24/7 supervision with all mobility and self-care tasks.  Reasons goals not met: N/A  Recommendation:  Patient will benefit from ongoing skilled OT services in home health setting to continue to advance functional skills in the area of BADL, iADL and Reduce care partner burden.  Equipment: BSC and tub transfer bench  Reasons for discharge: treatment goals met and discharge from hospital  Patient/family agrees with progress made and goals achieved: Yes  OT Discharge Precautions/Restrictions  Precautions Precautions: Fall Restrictions Weight Bearing Restrictions: No General   Vital Signs Therapy Vitals Temp: 97.5 F (36.4 C) Temp Source: Oral Pulse Rate: 94 Resp: 16 BP: 132/61 mmHg Patient Position (if appropriate): Sitting Pain Pain Assessment Pain Assessment: No/denies pain ADL  See FIM Vision/Perception  Vision- History Baseline Vision/History: Wears glasses Wears Glasses: At all times Patient Visual Report: No change from baseline Vision- Assessment Vision Assessment?: Yes Eye Alignment: Within Functional Limits Ocular Range of Motion: Within Functional Limits Tracking/Visual Pursuits: Decreased smoothness of horizontal tracking Saccades: Overshoots;Undershoots Convergence: Within functional limits Visual Fields: No apparent deficits  Cognition Overall Cognitive Status: History of cognitive impairments - at  baseline Arousal/Alertness: Awake/alert Orientation Level: Oriented X4 Memory: Impaired Memory Impairment: Decreased recall of new information;Decreased short term memory Awareness: Impaired Awareness Impairment: Emergent impairment Safety/Judgment: Appears intact Sensation Sensation Light Touch: Appears Intact Stereognosis: Not tested Hot/Cold: Not tested Proprioception: Appears Intact Coordination Gross Motor Movements are Fluid and Coordinated: No Finger Nose Finger Test: slight deviation and "shakiness" with Lt hand, 7x in 10 seconds on both sides 9 Hole Peg Test: Rt: 40 seconds, Lt: 45 seconds Motor  Motor Motor: Ataxia;Abnormal postural alignment and control Motor - Discharge Observations: Marked improvement in postural alignment/control as compared with evaluation. Mobility  Bed Mobility Bed Mobility: Supine to Sit;Sit to Supine;Scooting to HOB Supine to Sit: 6: Modified independent (Device/Increase time);HOB flat Sit to Supine: 6: Modified independent (Device/Increase time);HOB flat Scooting to HOB: 6: Modified independent (Device/Increase time) Transfers Sit to Stand: 5: Supervision Sit to Stand Details: Verbal cues for safe use of DME/AE Stand to Sit: 5: Supervision Stand to Sit Details (indicate cue type and reason): Verbal cues for safe use of DME/AE  Trunk/Postural Assessment  Cervical Assessment Cervical Assessment: Exceptions to [facility] (lower cervical flexion, upper cervical extension) Thoracic Assessment Thoracic Assessment: Exceptions to [facility] (thoracic kyphosis) Lumbar Assessment Lumbar Assessment: Exceptions to [facility] (posterior pelvic tilt) Postural Control Postural Control: Deficits on evaluation Righting Reactions: Standing on Airex foam: noted ankle and stepping strategies intact with posterior LOB. Hip strategy ineffective  Balance Balance Balance Assessed: Yes Static Sitting Balance Static Sitting - Balance Support: Feet supported;No upper extremity  supported Static Sitting - Level of Assistance: 5: Stand by assistance Static Standing Balance Static Standing - Balance Support: No upper extremity supported Static Standing - Level of Assistance: 5: Stand by assistance Static Stance: On foam Static Stance: on Foam, Eyes Closed: > 30 seconds with close supervision secondary to multidirectional postural sway. No overt LOB. Dynamic Standing Balance Dynamic Standing - Balance Support: No  upper extremity supported;During functional activity Dynamic Standing - Level of Assistance: 5: Stand by assistance Extremity/Trunk Assessment RUE Assessment RUE Assessment: Within Functional Limits LUE Assessment LUE Assessment: Within Functional Limits  See FIM for current functional status  [name], [facility] 02/22/2014, 3:18 PM

## 2014-02-22 NOTE — Progress Notes (Signed)
Speech Language Pathology Daily Session Note  Patient Details  Name: [name] MRN: [id number] Date of Birth: May 09, 1933  Today's Date: 02/22/2014 SLP Individual Time: Session 1: 1030-1100; Session 2: 2353-6144 SLP Individual Time Calculation (min): Session 1: 30 min; Session 2: 30 min  Short Term Goals: Week 2: SLP Short Term Goal 1 (Week 2): Pt will tolerate regular solids and thin liquids with mod I use of swallowing precautions.   SLP Short Term Goal 2 (Week 2): Pt will improve use of compensatory aids for memory to facilitate improved recall of daily information with min assist.  SLP Short Term Goal 3 (Week 2): Pt will improve functional problem solving over 75% of observable opportunities during structured therapeutic tasks with mod assist SLP Short Term Goal 4 (Week 2): Pt will improve safety awareness during funtctional tasks over 75% of observable opportunities with min assist.    Skilled Therapeutic Interventions:  Session 1: Pt was seen for skilled ST targeting cognitive goals.  Upon arrival, pt was seated upright in recliner, awake, alert, and agreeable to participate in [place].  SLP facilitated the session with a previously targeted therapeutic activity targeting delayed recall of new information and functional problem solving.   Pt initially did not recall activity when named (UNO) ; however, she was able to recall 2 rules of the game when shown the deck of cards.  During the abovementioned activity, pt required mod faded to supervision cues for working memory and functional problem solving.  Pt was also provided with handouts of memory compensatory strategies and activities for cognitive reorganization to facilitate carryover of skills at home with the assistance of pt's family.   Session 2: Pt was seen for skilled ST targeting cognitive goals.  Upon arrival, pt was seated upright in recliner, awake, alert, and agreeable to participate in ST with encouragement.  SLP facilitated the  session with a basic kitchen planning task targeting use of memory aids to facilitate recall of basic, daily information. During the abovementioned task pt required overall mod faded to min assist multimodal cues to use a written grocery list to facilitate recall of targeted items which she was tasked to locate.   Pt also required min assist cues to recall route from the ADL kitchen to her room.  Pt's son in law was present upon return to room and SLP briefly completed skilled family education regarding compensatory strategies for cognition and activities for cognitive reorganization.  Pt's son in law indicated that the family had come to a consensus that they would prefer speech follow up at discharge.  CSW made aware.  All questions were answered to the family's satisfaction at this point.  Continue per current plan of care.   FIM:  Comprehension Comprehension Mode: Auditory Comprehension: 5-Follows basic conversation/direction: With extra time/assistive device Expression Expression Mode: Verbal Expression: 5-Expresses basic needs/ideas: With extra time/assistive device Social Interaction Social Interaction: 6-Interacts appropriately with others with medication or extra time (anti-anxiety, antidepressant). Problem Solving Problem Solving: 4-Solves basic 75 - 89% of the time/requires cueing 10 - 24% of the time Memory Memory: 3-Recognizes or recalls 50 - 74% of the time/requires cueing 25 - 49% of the time  Pain Pain Assessment Pain Assessment: No/denies pain  Therapy/Group: Individual Therapy  Montez Stryker, Selinda Orion 02/22/2014, 1:51 PM

## 2014-02-22 NOTE — Progress Notes (Signed)
Occupational Therapy Session Note  Patient Details  Name: [name] MRN: [id number] Date of Birth: 10/16/1933  Today's Date: 02/22/2014 OT Individual Time: 1300-1400 OT Individual Time Calculation (min): 60 min    Short Term Goals: Week 2:  OT Short Term Goal 1 (Week 2): STG = LTGs due to remaining LOS  Skilled Therapeutic Interventions/Progress Updates:    Engaged in therapeutic activity with focus on safety with mobility and management of dizziness/low BP symptoms.  Pt refused bathing and dressing this session, reporting having a good shower during yesterday's session.  Ambulated to ADL apt with RW with supervision, completed tub/shower transfer with use of tub bench and RW with supervision.  Ambulated to therapy gym with RW again with no c/o dizziness and no LOB.  Engaged in balance activity on Biodex with pt requiring verbal and tactile cues for sequencing and technique.  Pt with decreased weight shift to Rt.  After activity pt reports slight dizziness, BP assessed with minimal change from resting BP at beginning of session.  Pt abmulated back to room with RW and completed toilet transfer and toileting with supervision.  Pt's sister present throughout session, discussed DME and appropriate use as well as recommendation of 24/7 supervision, with pt and pt's sister reporting no further questions.  Therapy Documentation Precautions:  Precautions Precautions: Fall Restrictions Weight Bearing Restrictions: No General:   Vital Signs: Therapy Vitals Temp: 97.5 F (36.4 C) Temp Source: Oral Pulse Rate: 94 Resp: 16 BP: 132/61 mmHg Patient Position (if appropriate): Sitting Pain: Pain Assessment Pain Assessment: No/denies pain  See FIM for current functional status  Therapy/Group: Individual Therapy  Simonne Come 02/22/2014, 3:11 PM

## 2014-02-22 NOTE — Discharge Summary (Signed)
NAMESHAELEY, SEGALL              ACCOUNT NO.:  [id number]  MEDICAL RECORD NO.:  [id number]  LOCATION:  4M05C                        FACILITY:  [place]  PHYSICIAN:  Charlett Blake, M.D.DATE OF BIRTH:  April 23, 1933  DATE OF ADMISSION:  02/09/2014 DATE OF DISCHARGE:  02/23/2014                              DISCHARGE SUMMARY   DISCHARGE DIAGNOSES: 1. 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 2. Support hose for deep vein thrombosis prophylaxis. 3. Pain management. 4. Mood with suspect underlying dementia. 5. Hypertension. 6. Refractory nausea, dizziness improved. 7. Dysphagia, improved. 8. Hypokalemia, resolved. 9. Constipation, resolved.  HISTORY OF PRESENT ILLNESS:  This is a 81 year old right-handed female with history of hypertension, underlying dementia, osteoporosis, nontraumatic cerebral hemorrhage in 2011.  The patient lives with family, used a rolling walker prior to admission.  Presented January 31, 2014 with nausea, vomiting, dizziness, and unsteady gait.  MRI showed acute left posterior inferior cerebellar artery territory infarct with petechial hemorrhage.  MRA with mild stenosis right P2 segment with luminal irregularity of the bilateral posterior cerebral arteries consistent with atherosclerosis.  Echocardiogram with ejection fraction of 60% and grade 1 diastolic dysfunction.  Carotid Dopplers, no ICA stenosis.  The patient did not receive tPA.  CT angiogram head and neck on February 03, 2014, showed acute infarct left inferior cerebellum in PICA territory with mass effect on the fourth ventricle.  No obstructive hydrocephalus or hemorrhage.  Bilateral carotid atherosclerotic disease noted.  Moderately severe stenosis proximal right external carotid artery.  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  Followup CT of the head with stable appearance.  Neurology consulted, placed on Plavix for CVA prophylaxis with the addition of subcutaneous Lovenox for DVT prophylaxis.  Bouts of hypokalemia resolved with potassium supplement. Intermittent bouts of refractory nausea, dizziness, no response with Thorazine or Phenergan.  CT abdomen and pelvis unremarkable.  She was tolerating a dysphagia 2 thin liquid diet.  Physical and occupational therapy ongoing.  The patient was admitted for a comprehensive rehab program.  PAST MEDICAL HISTORY:  See discharge diagnoses.  SOCIAL HISTORY:  Lives with family.  FUNCTIONAL HISTORY:  Prior to admission, independent with a rolling walker.  Limited mobility prior to admission due to history of recent compression fractures of the spine.  Min to mod assist for activities of daily living.  She was requiring mod assist to ambulate 6 feet, 2 person handheld assist for equipment prior to rehab admission.  PHYSICAL EXAMINATION:  VITAL SIGNS:  Blood pressure 159/67, pulse 80, temperature 98, respirations 20. GENERAL:  This was an alert female.  She could provide her name, age, date of birth, followed simple commands.  She mostly kept her eyes closed during the exam due to some nausea. LUNGS:  Clear to auscultation. CARDIAC:  Regular rate and rhythm. ABDOMEN:  Soft, nontender.  Good bowel sounds.  REHABILITATION HOSPITAL COURSE:  The patient was admitted to inpatient rehab services with therapies initiated on a 3-hour daily basis consisting of physical therapy, occupational therapy, speech therapy,  and rehabilitation nursing.  The following issues were addressed during the patient's rehabilitation stay.  Pertaining to Ms. Maietta's left PICA infarct remained stable, maintained on Plavix therapy, followed by Neurology Services.  She had been on subcutaneous Lovenox for DVT prophylaxis, discontinued as her  mobility improved.  She was using Percocet on limited basis for pain.  Blood pressures remained well controlled on Avapro. Her Norvasc was discontinued due to some orthostasis.  She would follow up with her primary MD.  She had some bouts of hypokalemia, resolved with potassium supplement.  Her diet had been advanced to regular.  Refractory nausea, dizziness had greatly improved.  She was using Antivert as needed.  She had been placed on Remeron for hospital course depression.  She received followup by Neuropsychology also in testing.  It showed overall neurocognitive profile supportive of cognitive disruption at the level of this dementia, although difficult to discern the exact etiology of her deficits based solely on cognitive screening measurements.  Her overall total score is on testing suggestive of marked cognitive disruption as noted.  She did remain fully oriented, but struggled with sub testing requiring memory and executive functioning.  She was able to tolerate full therapies with weekly collaborative interdisciplinary team conferences.  She was ambulating greater than 150 feet with a rolling walker, performed bed mobility on a regular double bed with assistance, practice sit-to-stand from various pieces of furniture to simulate home setting with supervision and verbal cues.  She is able step over tub walls with grab bars with her daughter's assistance, she showers from a tub bench.  Full family teaching was completed as well as education provided by Neurology in relation to the patient's CVA and ongoing therapies.  She was discharged to home.  Ongoing therapies dictated per rehab services.  DISCHARGE MEDICATIONS: 1. Avapro 300 mg p.o. daily. 2. Vitamin D 1000 units p.o. b.i.d. 3. Plavix 75 mg p.o. daily. 4. Antivert 25 mg p.o. t.i.d. as needed. 5. Remeron 7.5 mg p.o. at bedtime. 6. Multivitamin 1 tablet daily. 7. Percocet 5-325 mg 1 tablet every 6 hours as needed pain,  dispense     of 60 tablets. 8. Protonix 40 mg p.o. daily. 9.Vitamin B12 1000 mcg p.o. daily.  DIET:  Regular.  SPECIAL INSTRUCTIONS:  She would follow up with Dr. Alysia Penna at the outpatient rehab center as directed; Dr. Erlinda Hong, Neurology Services in 1 month, call for appointment; and with her primary care provider, Dr. Briscoe Deutscher.     Lauraine Rinne, P.A.   ______________________________ Charlett Blake, M.D.    DA/MEDQ  D:  02/22/2014  T:  02/22/2014  Job:  [id number]  cc:   Rosalin Hawking, MD [name]. Maceo Pro, M.D.

## 2014-02-22 NOTE — Progress Notes (Signed)
Subjective/Complaints: Fluid intake good, meal intake 40-75% Feels a bit dizzy  Review of Systems - Negative except tired Objective: Vital Signs: Blood pressure 94/52, pulse 81, temperature 98.5 F (36.9 C), temperature source Oral, resp. rate 18, height 5\' 4"  (1.626 m), weight 60.5 kg (133 lb 6.1 oz), SpO2 98 %. No results found. No results found for this or any previous visit (from the past 72 hour(s)).   HEENT: normal Cardio: RRR and no murmur Resp: CTA B/L and unlabored GI: BS positive and NT, ND Extremity:  Pulses positive and No Edema Skin:   Intact,  Neuro:oriented to person and place, recall is 2/3 words after 41min delay, Cranial Nerve II-XII normal, Normal Sensory, Normal Motor and Abnormal FMC-LUE Ataxic/ dec FMC, Motor 5/5 in BUE and 4/5 in BLE Musc/Skel:  Normal GEN NAD   Assessment/Plan: 1. 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 Physiatrist is providing close team supervision and 24 hour management of active medical problems listed below. Physiatrist and rehab team continue to assess barriers to discharge/monitor patient progress toward functional and medical goals.   FIM: FIM - Bathing Bathing Steps Patient Completed: Chest, Right Arm, Left Arm, Abdomen, Front perineal area, Right upper leg, Left upper leg, Buttocks, Right lower leg (including foot), Left lower leg (including foot) Bathing: 5: Supervision: Safety issues/verbal cues  FIM - Upper Body Dressing/Undressing Upper body dressing/undressing steps patient completed: Thread/unthread right bra strap, Thread/unthread left bra strap, Hook/unhook bra, Thread/unthread right sleeve of pullover shirt/dresss, Thread/unthread left sleeve of pullover shirt/dress, Put head through opening of pull over shirt/dress, Pull shirt over trunk Upper body dressing/undressing: 5: Set-up assist to: Obtain  clothing/put away FIM - Lower Body Dressing/Undressing Lower body dressing/undressing steps patient completed: Thread/unthread right underwear leg, Thread/unthread left underwear leg, Pull underwear up/down, Thread/unthread right pants leg, Thread/unthread left pants leg, Pull pants up/down, Don/Doff right shoe, Don/Doff left shoe, Fasten/unfasten right shoe, Fasten/unfasten left shoe, Don/Doff right sock, Don/Doff left sock Lower body dressing/undressing: 5: Supervision: Safety issues/verbal cues  FIM - Toileting Toileting steps completed by patient: Adjust clothing prior to toileting, Performs perineal hygiene, Adjust clothing after toileting Toileting Assistive Devices: Grab bar or rail for support Toileting: 5: Supervision: Safety issues/verbal cues  FIM - Radio producer Devices: Product manager Transfers: 5-To toilet/BSC: Supervision (verbal cues/safety issues), 5-From toilet/BSC: Supervision (verbal cues/safety issues)  FIM - Control and instrumentation engineer Devices: Copy: 6: Supine > Sit: No assist, 6: Sit > Supine: No assist, 5: Bed > Chair or W/C: Supervision (verbal cues/safety issues), 5: Chair or W/C > Bed: Supervision (verbal cues/safety issues)  FIM - Locomotion: Wheelchair Distance: 150 Locomotion: Wheelchair: 5: Travels 150 ft or more: maneuvers on rugs and over door sills with supervision, cueing or coaxing FIM - Locomotion: Ambulation Locomotion: Ambulation Assistive Devices: Administrator Ambulation/Gait Assistance: 5: Supervision Locomotion: Ambulation: 5: Travels 150 ft or more with supervision/safety issues  Comprehension Comprehension Mode: Auditory Comprehension: 5-Follows basic conversation/direction: With extra time/assistive device  Expression Expression Mode: Verbal Expression: 5-Expresses basic needs/ideas: With extra time/assistive device  Social Interaction Social Interaction:  6-Interacts appropriately with others with medication or extra time (anti-anxiety, antidepressant).  Problem Solving Problem Solving: 4-Solves basic 75 - 89% of the time/requires cueing 10 - 24% of the time  Memory Memory: 3-Recognizes or recalls 50 - 74% of the time/requires cueing 25 - 49% of the time  Medical Problem List and Plan: 1. 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  DVT Prophylaxis/Anticoagulation: ambulating >141ft, no hemiparesis 3. Pain Management/back pain due to osteoporosis: Percocet 1 tablet every 6 hours as needed as prior to admission. Monitor mental status 4. Mood/dementia: Discussed baseline cognition with family, may have depression as well, neuropsych eval 5. Neuropsych: This patient is capable of making decisions on her own behalf. 6. Skin/Wound Care: Routine skin checks 7. Fluids/Electrolytes/Nutrition/hypokalemia: Strict I know follow-up chemistries 8. Hypertension. Norvasc 2.5 mg daily, still occ low Systolic will d/c, Avapro 300 mg daily.Monitor with increased mobility,     9. Hyperlipidemia. Lipitor 10. Refractory nausea/dizziness. PRN antivert,improved d/c scopolamine patch. Vestibular evaluation to be completed 11. Dysphagia. Dysphagia 3 thin liquid diet. Follow speech therapy 12.  HypoK will replete po , repeat BMET with normal K, cont current KCl dose 13.  Constipation Laxative LOS (Days) 13 A FACE TO FACE EVALUATION WAS PERFORMED  KIRSTEINS,ANDREW E 02/22/2014, 7:06 AM

## 2014-02-22 NOTE — Progress Notes (Signed)
Physical Therapy Discharge Summary  Patient Details  Name: [name] MRN: [id number] Date of Birth: August 18, 1933  Today's Date: 02/22/2014 PT Individual Time: 0830-0930 PT Individual Time Calculation (min): 60 min    Patient has met 11 of 11 long term goals due to improved activity tolerance, improved balance, improved postural control, decreased pain, ability to compensate for deficits, improved attention, improved awareness and improved coordination.  Patient to discharge at an ambulatory level Supervision.   Patient's care partner is independent to provide the necessary physical and cognitive assistance at discharge.  Reasons goals not met: N/A; all goals met.  Recommendation:  Patient will benefit from ongoing skilled PT services in home health setting to continue to advance safe functional mobility, address ongoing impairments in stability/independence with functional mobility, increase adaptation to central vestibular impairments, and minimize fall risk.  Equipment: rolling walker  Reasons for discharge: treatment goals met and discharge from hospital  Patient/family agrees with progress made and goals achieved: Yes   Skilled Therapeutic Interventions/Progress Updates Session focused on assessing/addressing pt stability/independence with functional mobility. See discharge evaluation below for detailed findings. Pt ambulated >250' in controlled and home environments with rolling walker and supervision, increased time. Pt negotiated 12 stairs with 1 rail, forward-facing with reciprocal pattern and supervision. In rehab apartment, pt performed floor transfer with min guard, increased time, and min cueing for technique for transition from long sitting to quadruped. See below for detailed description of assist required with balance.  Per son-in-law's questions, explained strategies for managing orthostatic hypotension with focus on hydration, postural adjustment, compression garments,  and performance of LE therex. Son-in-law verbalized understanding. Explained goals, findings, progress, and discharge plan with focus on pt requirement for 24/7 supervision. Pt/son-in-law verbalized understanding and were in full agreement. Session ended in pt room, where pt was left seated in recliner with quick release belt in place for safety and all needs within reach.    PT Discharge Precautions/Restrictions Precautions Precautions: Fall Restrictions Weight Bearing Restrictions: No Vital Signs Therapy Vitals Temp: 97.5 F (36.4 C) Temp Source: Oral Pulse Rate: 94 Resp: 16 BP: 132/61 mmHg Patient Position (if appropriate): Sitting Pain Pain Assessment Pain Assessment: No/denies pain Vision/Perception  Vision - Assessment Eye Alignment: Within Functional Limits Ocular Range of Motion: Within Functional Limits Tracking/Visual Pursuits: Decreased smoothness of horizontal tracking Saccades: Overshoots;Undershoots Convergence: Within functional limits  Cognition Overall Cognitive Status: History of cognitive impairments - at baseline Arousal/Alertness: Awake/alert Orientation Level: Oriented X4 Memory: Impaired Memory Impairment: Decreased recall of new information;Decreased short term memory Awareness: Impaired Awareness Impairment: Emergent impairment Safety/Judgment: Appears intact Sensation Sensation Light Touch: Appears Intact Stereognosis: Not tested Hot/Cold: Not tested Proprioception: Appears Intact Coordination Gross Motor Movements are Fluid and Coordinated: No Fine Motor Movements are Fluid and Coordinated: No Heel Shin Test: Smoothness of movement slgihtlt decreased in LLE as compared to RLE. Motor  Motor Motor: Ataxia;Abnormal postural alignment and control Motor - Discharge Observations: Marked improvement in postural alignment/control as compared with evaluation.  Mobility Bed Mobility Bed Mobility: Supine to Sit;Sit to Supine;Scooting to HOB Supine  to Sit: 6: Modified independent (Device/Increase time);HOB flat Sit to Supine: 6: Modified independent (Device/Increase time);HOB flat Scooting to HOB: 6: Modified independent (Device/Increase time) Transfers Transfers: Yes Sit to Stand: 5: Supervision Sit to Stand Details: Verbal cues for safe use of DME/AE Stand to Sit: 5: Supervision Stand to Sit Details (indicate cue type and reason): Verbal cues for safe use of DME/AE Stand Pivot Transfers: 5: Supervision;Other (comment) (using rolling walker) Stand  Pivot Transfer Details: Verbal cues for safe use of DME/AE Locomotion  Ambulation Ambulation: Yes Ambulation/Gait Assistance: 5: Supervision Ambulation Distance (Feet): 250 Feet Assistive device: Rolling walker Ambulation/Gait Assistance Details: Verbal cues for precautions/safety Gait Gait: Yes Gait Pattern: Impaired Gait Pattern: Trunk flexed;Lateral trunk lean to right;Step-through pattern (lateral trunk lean to R side noted with increased fatigue, when ambulating without walker) Gait velocity: Self-selected gait speed =.78 m/s High Level Ambulation High Level Ambulation: Side stepping;Direction changes Side Stepping: supervision with rolling walker Direction Changes: supervision with rolling walker Stairs / Additional Locomotion Stairs: Yes Stairs Assistance: 5: Supervision Stair Management Technique: Alternating pattern;One rail Right;Forwards Number of Stairs: 12 Height of Stairs: 7.5 Ramp: 5: Supervision;Other (comment) (using rolling walker) Curb: 5: Supervision;Other (comment) (using rolling walker) Wheelchair Mobility Wheelchair Mobility: Yes Wheelchair Assistance: 5: Supervision Wheelchair Propulsion: Both upper extremities Wheelchair Parts Management: Supervision/cueing;Needs assistance;Other (comment) (supervision, with exception of leg rest management) Distance: 150  Trunk/Postural Assessment  Cervical Assessment Cervical Assessment: Exceptions to [facility] (lower  cervical flexion, upper cervical extension) Thoracic Assessment Thoracic Assessment: Exceptions to [facility] (thoracic kyphosis) Lumbar Assessment Lumbar Assessment: Exceptions to [facility] (posterior pelvic tilt) Postural Control Postural Control: Deficits on evaluation Righting Reactions: Standing on Airex foam: noted ankle and stepping strategies intact with posterior LOB. Hip strategy ineffective  Balance Balance Balance Assessed: Yes Static Standing Balance Static Standing - Balance Support: No upper extremity supported Static Stance: On foam Static Stance: on Foam, Eyes Closed: > 30 seconds with close supervision secondary to multidirectional postural sway. No overt LOB. Extremity Assessment  RUE Assessment RUE Assessment: Within Functional Limits LUE Assessment LUE Assessment: Within Functional Limits RLE Assessment RLE Assessment: Within Functional Limits LLE Assessment LLE Assessment: Within Functional Limits  See FIM for current functional status  Janashia Parco, Malva Cogan 02/22/2014, 3:54 PM

## 2014-02-22 NOTE — Discharge Summary (Signed)
Discharge summary job # [id number]

## 2014-02-23 MED ORDER — OMEPRAZOLE 40 MG PO CPDR: 40.0000 mg | DELAYED_RELEASE_CAPSULE | Freq: Every morning | ORAL | Status: DC

## 2014-02-23 MED ORDER — MIRTAZAPINE 7.5 MG PO TABS: 7.5000 mg | ORAL_TABLET | Freq: Every day | ORAL | Status: DC

## 2014-02-23 MED ORDER — OXYCODONE-ACETAMINOPHEN 5-325 MG PO TABS: 1.0000 | ORAL_TABLET | Freq: Four times a day (QID) | ORAL | Status: DC | PRN

## 2014-02-23 MED ORDER — MECLIZINE HCL 25 MG PO TABS: 25.0000 mg | ORAL_TABLET | Freq: Three times a day (TID) | ORAL | Status: DC | PRN

## 2014-02-23 MED ORDER — IRBESARTAN 300 MG PO TABS: 300.0000 mg | ORAL_TABLET | Freq: Every day | ORAL | Status: DC

## 2014-02-23 MED ORDER — ATORVASTATIN CALCIUM 80 MG PO TABS: 80.0000 mg | ORAL_TABLET | Freq: Every morning | ORAL | Status: DC

## 2014-02-23 MED ORDER — CLOPIDOGREL BISULFATE 75 MG PO TABS: 75.0000 mg | ORAL_TABLET | Freq: Every day | ORAL | Status: AC

## 2014-02-23 NOTE — Progress Notes (Signed)
[name] is a 81 y.o. female Nov 02, 1933 [id number]  Subjective: No new complaints. No new problems. Slept well.  Objective: Vital signs in last 24 hours: Temp:  [97.5 F (36.4 C)-98 F (36.7 C)] 98 F (36.7 C) (02/17 0437) Pulse Rate:  [79-94] 79 (02/17 0437) Resp:  [16-17] 17 (02/17 0437) BP: (107-132)/(50-61) 107/54 mmHg (02/17 0437) SpO2:  [98 %] 98 % (02/17 0437) Weight:  [60.1 kg (132 lb 7.9 oz)] 60.1 kg (132 lb 7.9 oz) (02/17 0437) Weight change:  Last BM Date: 02/22/14  Intake/Output from previous day: 02/16 0701 - 02/17 0700 In: 840 [P.O.:840] Out: -  Last cbgs: CBG (last 3)  No results for input(s): GLUCAP in the last 72 hours.   Physical Exam General: No apparent distress   HEENT: not dry Lungs: Normal effort. Lungs clear to auscultation, no crackles or wheezes. Cardiovascular: Regular rate and rhythm, no edema Abdomen: S/NT/ND; BS(+) Musculoskeletal:  unchanged Neurological: No new neurological deficits  Mental state: Alert, cooperative    Lab Results: BMET    Component Value Date/Time   NA 138 02/15/2014 0625   K 3.7 02/15/2014 0625   CL 102 02/15/2014 0625   CO2 30 02/15/2014 0625   GLUCOSE 101* 02/15/2014 0625   BUN <5* 02/15/2014 0625   CREATININE 0.98 02/15/2014 0625   CALCIUM 9.1 02/15/2014 0625   GFRNONAA 53* 02/15/2014 0625   GFRAA 62* 02/15/2014 0625   CBC    Component Value Date/Time   WBC 9.8 02/10/2014 0500   RBC 3.29* 02/10/2014 0500   HGB 10.6* 02/10/2014 0500   HCT 29.8* 02/10/2014 0500   PLT 196 02/10/2014 0500   MCV 90.6 02/10/2014 0500   MCH 32.2 02/10/2014 0500   MCHC 35.6 02/10/2014 0500   RDW 12.2 02/10/2014 0500   LYMPHSABS 1.5 02/10/2014 0500   MONOABS 0.9 02/10/2014 0500   EOSABS 0.2 02/10/2014 0500   BASOSABS 0.0 02/10/2014 0500    Studies/Results: No results found.  Medications: I have reviewed the patient's current medications.  Assessment/Plan:  1. Functional deficits secondary to left PICA  infarct with petechial hemorrhage Stable for D/C today F/u PCP in 1-2 weeks F/u PM&R 3 weeks See D/C summary See D/C instructions  2. DVT Prophylaxis/Anticoagulation: none needed at home 3. Pain Management/back pain due to osteoporosis: Percocet 1 tablet every 6 hours as needed as prior to admission. Monitor mental status 4. Mood/dementia: Discussed baseline cognition with family, may have depression as well, neuropsych eval 5. Neuropsych: This patient is capable of making decisions on her own behalf. 6. Skin/Wound Care: Routine skin checks 7. Fluids/Electrolytes/Nutrition/hypokalemia: Strict I know follow-up chemistries 8. Hypertension.  Avapro 300 mg daily.9. Hyperlipidemia. Lipitor 10. Refractory nausea/dizziness. PRN antivert,improved d/c scopolamine patch. Vestibular evaluation to be completed 11. Dysphagia. Dysphagia 3 thin liquid diet. Follow speech therapy 12. HypoK will replete po , repeat BMET with normal K, cont current KCl dose      Length of stay, days: 14  Charlett Blake , MD 02/23/2014, 7:49 AM

## 2014-02-23 NOTE — Progress Notes (Signed)
Social Work Discharge Note Discharge Note  The overall goal for the admission was met for:   Discharge location: [place] HIRED CAREGIVER-24 HR CARE  Length of Stay: Yes-14 DAYS  Discharge activity level: Yes-SUPERVISION LEVEL WITH CUEING  Home/community participation: Yes  Services provided included: MD, RD, PT, OT, SLP, RN, CM, TR, Pharmacy and [place]: Private Insurance: [place]  Follow-up services arranged: Home Health: [place] [facility]ARE-PT,OT,SP,RN, DME: [facility]-ROLL[facility], BEDSIDE [facility], [facility] and Patient/Family has no preference for HH/DME agencies  Comments (or additional information):FAMILY HERE FOR EDUCATION COMPLETED AND PREPARED TO GIVE HER 24 HR CARE AT HOME  Patient/Family verbalized understanding of follow-up arrangements: Yes  Individual responsible for coordination of the follow-up plan: [name]-DAUGHTER  Confirmed correct DME delivered: Elease Hashimoto 02/23/2014    Claudell Wohler, [name]

## 2014-02-23 NOTE — Progress Notes (Signed)
Patient and daughter given discharge instructions from [place], [place]; all questions answered.  Patient escorted by Paulita Cradle, NT to family's vehicle.

## 2014-02-24 NOTE — Progress Notes (Signed)
Speech Language Pathology Discharge Summary  Patient Details  Name: [name] MRN: [id number] Date of Birth: 1933-05-31   Patient has met 4 of 4 long term goals.  Patient to discharge at overall Supervision level.  Reasons goals not met: n/a   Clinical Impression/Discharge Summary:  Pt made functional gains while inpatient and has met 4 out of 4 long term goals due to improved toleration of regular textures and improved use of compensatory aids to facilitate recall of daily information.  Pt is currently min assist-supervision for basic cognitive tasks and is tolerating her currently prescribed diet with mod I use of swallowing precautions to minimize overt s/s of aspiration with her currently prescribed diet.  Pt continues to present with decreased recall of new information and decreased functional problem solving which family reports to be altered from her baseline.  As a result, pt would benefit from skilled ST follow up in the home health setting to maximize functional independence and reduce burden of care.  Pt and family education is complete at this time.  Pt is discharging home with 24/7 supervision as well as assistance for medication and financial management from family.      Care Partner:  Caregiver Able to Provide Assistance: Yes  Type of Caregiver Assistance: Physical;Cognitive  Recommendation:  Home Health SLP;24 hour supervision/assistance  Rationale for SLP Follow Up: Maximize cognitive function and independence;Reduce caregiver burden   Equipment: none recommended by SLP    Reasons for discharge: Discharged from hospital   Patient/Family Agrees with Progress Made and Goals Achieved: Yes   See FIM for current functional status  Emilio Math 02/24/2014, 4:24 PM

## 2014-03-15 DIAGNOSIS — Z87891 Personal history of nicotine dependence: Secondary | ICD-10-CM

## 2014-03-15 DIAGNOSIS — I10 Essential (primary) hypertension: Secondary | ICD-10-CM | POA: Diagnosis not present

## 2014-03-15 DIAGNOSIS — F329 Major depressive disorder, single episode, unspecified: Secondary | ICD-10-CM

## 2014-03-15 DIAGNOSIS — I6931 Cognitive deficits following cerebral infarction: Secondary | ICD-10-CM | POA: Diagnosis not present

## 2014-03-15 DIAGNOSIS — I69391 Dysphagia following cerebral infarction: Secondary | ICD-10-CM | POA: Diagnosis not present

## 2014-03-15 DIAGNOSIS — F0280 Dementia in other diseases classified elsewhere without behavioral disturbance: Secondary | ICD-10-CM

## 2014-03-15 DIAGNOSIS — I6521 Occlusion and stenosis of right carotid artery: Secondary | ICD-10-CM | POA: Diagnosis not present

## 2014-03-18 ENCOUNTER — Inpatient Hospital Stay: Payer: Medicare Other | Admitting: Physical Medicine & Rehabilitation

## 2014-03-25 ENCOUNTER — Encounter: Payer: Self-pay | Admitting: Physical Medicine & Rehabilitation

## 2014-03-25 ENCOUNTER — Encounter: Payer: Medicare Other | Attending: Physical Medicine & Rehabilitation

## 2014-03-25 ENCOUNTER — Ambulatory Visit (HOSPITAL_BASED_OUTPATIENT_CLINIC_OR_DEPARTMENT_OTHER): Payer: Medicare Other | Admitting: Physical Medicine & Rehabilitation

## 2014-03-25 VITALS — BP 182/86 | HR 62 | Resp 14

## 2014-03-25 DIAGNOSIS — I639 Cerebral infarction, unspecified: Secondary | ICD-10-CM | POA: Diagnosis not present

## 2014-03-25 DIAGNOSIS — I69393 Ataxia following cerebral infarction: Secondary | ICD-10-CM

## 2014-03-25 NOTE — Progress Notes (Signed)
Subjective:    Patient ID: [name], female    DOB: Sep 08, 1933, 81 y.o.   MRN: [id number]  HPI Reduced diffusion and LEFT inferior cerebellum,01/31/14 D/C from CIR 2/17 Patient has not had falls at home she is staying with her daughter. Using a walker. Patient does state she sometimes gets up if her daughter is not around her. Not driving for several years. Home health speech and OT had discontinued, PT is continuing visits.   Pain Inventory Average Pain 1 Pain Right Now 0 My pain is intermittent, dull and aching  In the last 24 hours, has pain interfered with the following? General activity 0 Relation with others 0 Enjoyment of life 0 What TIME of day is your pain at its worst? evening Sleep (in general) Good  Pain is worse with: walking, standing and some activites Pain improves with: rest and medication Relief from Meds: 4  Mobility use a walker how many minutes can you walk? 5-10 ability to climb steps?  no do you drive?  no  Function retired  Neuro/Psych weakness trouble walking confusion depression anxiety  Prior Studies Any changes since last visit?  no  Physicians involved in your care Any changes since last visit?  no   History reviewed. No pertinent family history. History   Social History  . Marital Status: Married    Spouse Name: N/A  . Number of Children: N/A  . Years of Education: N/A   Social History Main Topics  . Smoking status: Former Research scientist (life sciences)  . Smokeless tobacco: Not on file  . Alcohol Use: No  . Drug Use: No  . Sexual Activity: No   Other Topics Concern  . None   Social History Narrative   Past Surgical History  Procedure Laterality Date  . Abdominal hysterectomy    . Cholecystectomy     Past Medical History  Diagnosis Date  . Hypertension   . Hyperlipemia   . Dementia   . Cerebral hemorrhage, nontraumatic 2011  . CVA (cerebral infarction) 2011    left cerebellum  . Ureterocele, acquired    BP 182/86 mmHg   Pulse 62  Resp 14  SpO2 99%  Opioid Risk Score:   Fall Risk Score: Moderate Fall Risk (6-13 points)`1  Depression screen PHQ 2/9  Depression screen PHQ 2/9 03/25/2014  Decreased Interest 0  Down, Depressed, Hopeless 1  PHQ - 2 Score 1  Altered sleeping 0  Tired, decreased energy 0  Change in appetite 3  Feeling bad or failure about yourself  1  Trouble concentrating 3  Moving slowly or fidgety/restless 3  Suicidal thoughts 0  PHQ-9 Score 11     Review of Systems  HENT: Negative.   Eyes: Negative.   Respiratory: Negative.   Cardiovascular: Negative.   Gastrointestinal: Negative.   Endocrine: Negative.   Genitourinary: Negative.   Musculoskeletal: Positive for back pain.  Skin: Negative.   Allergic/Immunologic: Negative.   Neurological: Positive for weakness.       Trouble walking  Hematological: Negative.   Psychiatric/Behavioral: Positive for confusion and dysphoric mood. The patient is nervous/anxious.        Objective:   Physical Exam  Constitutional: She appears well-developed and well-nourished.  HENT:  Head: Normocephalic and atraumatic.  Eyes: Conjunctivae are normal. Pupils are equal, round, and reactive to light.  Neck: Normal range of motion.  Neurological: She is alert. She has normal strength. Gait abnormal.  Patient leans to the left during gait. Unsteady gait did not require  handheld assist  No Dysmetria on finger-nose-finger testing or heel-to-shin testing  Psychiatric: She has a normal mood and affect.  Nursing note and vitals reviewed.         Assessment & Plan:  1.Left cerebellar infarct with truncal ataxia. Has made good improvements with physical therapy. Recommend continue using walker. Once physical therapy has discontinued in home health, patient's daughter will call us if they decide to pursue outpatient therapy. Currently having difficulty with transportation on a regular basis.  2. History of dementia her Mini-Mental status  exam score was 25 which given her age and educational level appears about normal, patient reports a sixth grade education.

## 2014-03-25 NOTE — Patient Instructions (Signed)
Please do your home exercise program  Please call if you would like orders for outpatient therapy

## 2014-03-28 ENCOUNTER — Telehealth: Payer: Self-pay | Admitting: *Deleted

## 2014-03-28 NOTE — Telephone Encounter (Signed)
Called to extend PT [facility] additional 2 weeks.  Approval given.

## 2014-03-29 ENCOUNTER — Telehealth: Payer: Self-pay

## 2014-03-29 NOTE — Telephone Encounter (Signed)
See speciality notes 

## 2014-03-31 ENCOUNTER — Ambulatory Visit (INDEPENDENT_AMBULATORY_CARE_PROVIDER_SITE_OTHER): Payer: Medicare Other | Admitting: Family

## 2014-03-31 ENCOUNTER — Encounter: Payer: Self-pay | Admitting: Family

## 2014-03-31 VITALS — BP 160/68 | HR 70 | Temp 97.8°F | Resp 14 | Ht 63.0 in | Wt 126.6 lb

## 2014-03-31 DIAGNOSIS — M4854XA Collapsed vertebra, not elsewhere classified, thoracic region, initial encounter for fracture: Secondary | ICD-10-CM | POA: Diagnosis not present

## 2014-03-31 DIAGNOSIS — E785 Hyperlipidemia, unspecified: Secondary | ICD-10-CM | POA: Diagnosis not present

## 2014-03-31 DIAGNOSIS — M810 Age-related osteoporosis without current pathological fracture: Secondary | ICD-10-CM

## 2014-03-31 DIAGNOSIS — I10 Essential (primary) hypertension: Secondary | ICD-10-CM | POA: Diagnosis not present

## 2014-03-31 DIAGNOSIS — I639 Cerebral infarction, unspecified: Secondary | ICD-10-CM

## 2014-03-31 DIAGNOSIS — F329 Major depressive disorder, single episode, unspecified: Secondary | ICD-10-CM | POA: Insufficient documentation

## 2014-03-31 DIAGNOSIS — K219 Gastro-esophageal reflux disease without esophagitis: Secondary | ICD-10-CM

## 2014-03-31 DIAGNOSIS — F32A Depression, unspecified: Secondary | ICD-10-CM

## 2014-03-31 DIAGNOSIS — F0390 Unspecified dementia without behavioral disturbance: Secondary | ICD-10-CM

## 2014-03-31 LAB — VITAMIN D 25 HYDROXY (VIT D DEFICIENCY, FRACTURES): VITD: 33.84 ng/mL (ref 30.00–100.00)

## 2014-03-31 MED ORDER — FLUOXETINE HCL 10 MG PO CAPS: 10.0000 mg | ORAL_CAPSULE | Freq: Every day | ORAL | Status: DC

## 2014-03-31 MED ORDER — ALENDRONATE SODIUM 70 MG PO TABS: 70.0000 mg | ORAL_TABLET | ORAL | Status: DC

## 2014-03-31 MED ORDER — ATORVASTATIN CALCIUM 80 MG PO TABS: 80.0000 mg | ORAL_TABLET | Freq: Every morning | ORAL | Status: DC

## 2014-03-31 MED ORDER — IRBESARTAN 300 MG PO TABS: 300.0000 mg | ORAL_TABLET | Freq: Every day | ORAL | Status: DC

## 2014-03-31 NOTE — Assessment & Plan Note (Signed)
Stable lipids on lipitor, continue same.

## 2014-03-31 NOTE — Patient Instructions (Signed)
Stop omeprazole. Call if you have recurrent heart burn symptoms. Complete lab work prior to leaving. Please monitor Blood pressure several times a week at home and let us know if you are getting readings >150. You will be contacted about your bone density.  Follow up in 1 month for a medicare wellness visit.

## 2014-03-31 NOTE — Assessment & Plan Note (Signed)
Advised trial off of omeprazole, call if recurrent gerd symptosm.

## 2014-03-31 NOTE — Progress Notes (Signed)
Subjective:    Patient ID: [name], female    DOB: Sep 12, 1933, 81 y.o.   MRN: [id number]  HPI  [name] is a 81 yr old female who presents today to establish care. Was followed by Dr. Maceo Pro at [place]. (records will be requested)  1) HTN- On Avapro.  Had CVA 01/31/14.  Prior to CVA she was on some additional meds.  Daughter reports + orthostasis.  She brings with her today BP readings from home 148/67 laying HR 68, 129/77 sitting HR 72, 124/64 HR 88 standing all on 03/06/14  3/2 sitting 140/60 sitting, 128/60 standing 3/4 noon 168/70 sitting, 138/70 standing 3/4 pm    168/82 sitting, 128/73 standing   BP Readings from Last 3 Encounters:  03/31/14 160/68  03/25/14 182/86  02/23/14 107/54   2) Hyperlipidemia- on lipitor Lab Results  Component Value Date   CHOL 126 02/01/2014   HDL 46 02/01/2014   LDLCALC 71 02/01/2014   TRIG 46 02/01/2014   CHOLHDL 2.7 02/01/2014   3) Dementia/CVA 2011- stroke was found incidentally in 2011.  Had anther stroke 1/16- Has hx of dementia- has been managing.  She was placed on plavix.  Dementia has been much worse since her most recent cva.  Lives with daughter.    4) Chronic kidney disease- Most recent creatinine normal per our system. Lab Results  Component Value Date   CREATININE 0.98 02/15/2014   5) Depression- reports that she has struggled all her life.  Was started on prozac which is helping a lot per daughte.r    6) Hx of collapsed vertebrae- initially 2 years ago. 2 weeks prior to the stroke. Had severe back pain occurred after she was raking in the yard.  Reports no current back pain.   7) GERD- concerned about omeprazole due to "brittle bones" and counteracting plavix? Denies hx of esophageal issues.   8) Osteoporosis- on fosamax.  Started in the end of February. She has not had a recent bone    Review of Systems  Constitutional:       Lost some weight during her hospitalization  HENT:       Mild hearing loss  Eyes:  Positive for visual disturbance.       Cataracts  Respiratory: Negative for cough and shortness of breath.   Cardiovascular: Negative for chest pain.  Gastrointestinal: Negative for nausea, diarrhea and constipation.  Genitourinary: Negative for dysuria and frequency.  Musculoskeletal: Negative for back pain.  Skin: Negative for rash.  Neurological: Negative for headaches.  Hematological: Negative for adenopathy.  Psychiatric/Behavioral:       See HPI   Past Medical History  Diagnosis Date  . Hypertension   . Hyperlipemia   . Dementia   . Cerebral hemorrhage, nontraumatic 2011  . CVA (cerebral infarction) 2011    left cerebellum  . Ureterocele, acquired   . Broken arm     left x 3  . Brain swelling 2016  . History of pubovaginal sling   . Depression   . Chronic kidney disease     stage 3    History   Social History  . Marital Status: Married    Spouse Name: N/A  . Number of Children: N/A  . Years of Education: N/A   Occupational History  . Not on file.   Social History Main Topics  . Smoking status: Former Research scientist (life sciences)  . Smokeless tobacco: Not on file  . Alcohol Use: No  . Drug Use: No  .  Sexual Activity: No   Other Topics Concern  . Not on file   Social History Narrative   Former Nurse, children's   Completed 6th grade   Married   2 sons and 1 daughter ( lives with daughter)   No hobbies    Past Surgical History  Procedure Laterality Date  . Abdominal hysterectomy    . Cholecystectomy    . Fracture surgery Left     3 fractures in past  . Other surgical history  2006    "bladder tac"    Family History  Problem Relation Age of Onset  . Stroke Mother   . Diabetes Mother   . Hyperlipidemia Mother   . Hypertension Mother   . Stroke Father   . Heart disease Father   . Hyperlipidemia Father   . Depression Sister   . Cancer Brother 40    lung    Allergies  Allergen Reactions  . Penicillins     Reaction: unknown  . Latex Rash    Current  Outpatient Prescriptions on File Prior to Visit  Medication Sig Dispense Refill  . cholecalciferol (VITAMIN D) 1000 UNITS tablet Take 1,000 Units by mouth 2 (two) times daily.    . clopidogrel (PLAVIX) 75 MG tablet Take 1 tablet (75 mg total) by mouth daily. 30 tablet 1  . meclizine (ANTIVERT) 25 MG tablet Take 1 tablet (25 mg total) by mouth 3 (three) times daily as needed for dizziness. 60 tablet 0  . Multiple Vitamin (MULTIVITAMIN WITH MINERALS) TABS Take 1 tablet by mouth daily.    Marland Kitchen omeprazole (PRILOSEC) 40 MG capsule Take 1 capsule (40 mg total) by mouth every morning. 30 capsule 1  . vitamin B-12 (CYANOCOBALAMIN) 1000 MCG tablet Take 1,000 mcg by mouth daily.    Marland Kitchen oxyCODONE-acetaminophen (PERCOCET/ROXICET) 5-325 MG per tablet Take 1 tablet by mouth every 6 (six) hours as needed for moderate pain or severe pain. (Patient not taking: Reported on 03/31/2014) 60 tablet 0   No current facility-administered medications on file prior to visit.    BP 160/68 mmHg  Pulse 70  Temp(Src) 97.8 F (36.6 C) (Oral)  Resp 14  Ht 5\' 3"  (1.6 m)  Wt 126 lb 9.6 oz (57.425 kg)  BMI 22.43 kg/m2  SpO2 98%       Objective:   Physical Exam  Constitutional: She appears well-developed and well-nourished.  HENT:  Head: Normocephalic and atraumatic.  Right Ear: Tympanic membrane and ear canal normal.  Left Ear: Tympanic membrane and ear canal normal.  Eyes: No scleral icterus.  Neck: No thyromegaly present.  Cardiovascular: Normal rate, regular rhythm and normal heart sounds.   No murmur heard. Pulmonary/Chest: Effort normal and breath sounds normal. No respiratory distress. She has no wheezes.  Musculoskeletal: She exhibits no edema.  Lymphadenopathy:    She has no cervical adenopathy.  Neurological: She is alert.  Bilateral UE/LE strength is 5/5 Drags left foot slightly with walking (walks with walker)  Skin: Skin is warm and dry.  Psychiatric: She has a normal mood and affect. Her behavior is  normal. Judgment and thought content normal.          Assessment & Plan:

## 2014-03-31 NOTE — Assessment & Plan Note (Signed)
I am concerned about her orthostasis and increased fall risk. Will hold off on increasing BP meds at this time despite elevated DBP.  I have advised the daughter to continue to monitor BP at home.

## 2014-03-31 NOTE — Assessment & Plan Note (Signed)
On plavix, statin, continuing PT. Advised pt and daughter to keep upcoming appointment with neurology.

## 2014-03-31 NOTE — Assessment & Plan Note (Signed)
No further pain.  Will obtain dexa scan, vit D. Continue fosamax, caltrate.

## 2014-03-31 NOTE — Assessment & Plan Note (Signed)
Stable and improved on prozac, continue same.

## 2014-03-31 NOTE — Assessment & Plan Note (Signed)
Daughter is primary caregiver.  She has upcoming appointment with neurology.

## 2014-04-01 ENCOUNTER — Encounter: Payer: Self-pay | Admitting: Family

## 2014-04-05 ENCOUNTER — Telehealth: Payer: Self-pay | Admitting: Family

## 2014-04-05 NOTE — Telephone Encounter (Signed)
Spoke to patient's daughter who has been keeping logs of BP readings.  Per daughter, patient was recently at [facility] for low BP/ orthostatic hypotension.  Her home readings have been for example: 176/71 sitting and 139/73 standing.  She is having no symptoms- no headaches, no blurry vision or dizziness.  Daughter states that before the patient had a stroke, her blood pressure ran high in the 180s so she likely would not have symptoms.    Appointment made with Debbrah Alar for 04/11/14 (earliest available) per patient preference.  Patient has another appointment that day but would like that time because she has the day off.    Please see note below and advise if there is anything she should do in the meantime.

## 2014-04-05 NOTE — Telephone Encounter (Signed)
If she is not willing to see anyone until Melissa returns: Continue medications as directed. She needs to limit salt intake. She develops any symptoms of lightheadedness, blurry vision, headache - she needs to be seen sooner.   I would however recommend she be seen sooner so we can assess her blood pressure and add on medications to get her blood pressure at goal, and to reduce her risk for stroke and heart attack.

## 2014-04-05 NOTE — Telephone Encounter (Signed)
Notified daughter and she agreed for patient to be seen sooner.  Scheduled with Mackie Pai, PA at 9:00 on 04/07/14.

## 2014-04-05 NOTE — Telephone Encounter (Signed)
Caller name: Andee Poles from [facility] to pt: Call back number: [phone] Pharmacy:  Reason for call:   bp is running high. 162/60 sitting at todays visit. Then 159/82 standing. No other symptoms.

## 2014-04-06 ENCOUNTER — Inpatient Hospital Stay (HOSPITAL_COMMUNITY)
Admission: EM | Admit: 2014-04-06 | Discharge: 2014-04-08 | DRG: 312 | Disposition: A | Discharge status: Home / Self Care | Payer: Medicare Other | Attending: Internal Medicine | Admitting: Internal Medicine

## 2014-04-06 ENCOUNTER — Emergency Department (HOSPITAL_COMMUNITY): Payer: Medicare Other

## 2014-04-06 ENCOUNTER — Telehealth: Payer: Self-pay | Admitting: Family

## 2014-04-06 ENCOUNTER — Encounter (HOSPITAL_COMMUNITY): Payer: Self-pay | Admitting: Emergency Medicine

## 2014-04-06 ENCOUNTER — Ambulatory Visit: Payer: Self-pay | Admitting: Medical

## 2014-04-06 DIAGNOSIS — R410 Disorientation, unspecified: Secondary | ICD-10-CM | POA: Diagnosis not present

## 2014-04-06 DIAGNOSIS — Z7901 Long term (current) use of anticoagulants: Secondary | ICD-10-CM | POA: Diagnosis not present

## 2014-04-06 DIAGNOSIS — Z87891 Personal history of nicotine dependence: Secondary | ICD-10-CM | POA: Diagnosis not present

## 2014-04-06 DIAGNOSIS — R42 Dizziness and giddiness: Secondary | ICD-10-CM

## 2014-04-06 DIAGNOSIS — F0390 Unspecified dementia without behavioral disturbance: Secondary | ICD-10-CM | POA: Diagnosis present

## 2014-04-06 DIAGNOSIS — R55 Syncope and collapse: Secondary | ICD-10-CM | POA: Diagnosis not present

## 2014-04-06 DIAGNOSIS — G911 Obstructive hydrocephalus: Secondary | ICD-10-CM | POA: Diagnosis present

## 2014-04-06 DIAGNOSIS — F329 Major depressive disorder, single episode, unspecified: Secondary | ICD-10-CM | POA: Diagnosis present

## 2014-04-06 DIAGNOSIS — R531 Weakness: Secondary | ICD-10-CM | POA: Diagnosis present

## 2014-04-06 DIAGNOSIS — N183 Chronic kidney disease, stage 3 (moderate): Secondary | ICD-10-CM | POA: Diagnosis present

## 2014-04-06 DIAGNOSIS — M21379 Foot drop, unspecified foot: Secondary | ICD-10-CM | POA: Diagnosis present

## 2014-04-06 DIAGNOSIS — Z91040 Latex allergy status: Secondary | ICD-10-CM | POA: Diagnosis not present

## 2014-04-06 DIAGNOSIS — Z9049 Acquired absence of other specified parts of digestive tract: Secondary | ICD-10-CM | POA: Diagnosis present

## 2014-04-06 DIAGNOSIS — Z7902 Long term (current) use of antithrombotics/antiplatelets: Secondary | ICD-10-CM | POA: Diagnosis not present

## 2014-04-06 DIAGNOSIS — I10 Essential (primary) hypertension: Secondary | ICD-10-CM | POA: Diagnosis present

## 2014-04-06 DIAGNOSIS — Z90710 Acquired absence of both cervix and uterus: Secondary | ICD-10-CM

## 2014-04-06 DIAGNOSIS — Z6821 Body mass index (BMI) 21.0-21.9, adult: Secondary | ICD-10-CM

## 2014-04-06 DIAGNOSIS — I951 Orthostatic hypotension: Secondary | ICD-10-CM | POA: Diagnosis present

## 2014-04-06 DIAGNOSIS — I129 Hypertensive chronic kidney disease with stage 1 through stage 4 chronic kidney disease, or unspecified chronic kidney disease: Secondary | ICD-10-CM | POA: Diagnosis present

## 2014-04-06 DIAGNOSIS — E785 Hyperlipidemia, unspecified: Secondary | ICD-10-CM | POA: Diagnosis present

## 2014-04-06 DIAGNOSIS — Z880 Allergy status to penicillin: Secondary | ICD-10-CM | POA: Diagnosis not present

## 2014-04-06 DIAGNOSIS — E440 Moderate protein-calorie malnutrition: Secondary | ICD-10-CM | POA: Diagnosis present

## 2014-04-06 DIAGNOSIS — Z8673 Personal history of transient ischemic attack (TIA), and cerebral infarction without residual deficits: Secondary | ICD-10-CM

## 2014-04-06 LAB — COMPREHENSIVE METABOLIC PANEL
ALBUMIN: 3.6 g/dL (ref 3.5–5.2)
ALT: 21 U/L (ref 0–35)
AST: 25 U/L (ref 0–37)
Alkaline Phosphatase: 89 U/L (ref 39–117)
Anion gap: 11 (ref 5–15)
BILIRUBIN TOTAL: 0.6 mg/dL (ref 0.3–1.2)
BUN: 9 mg/dL (ref 6–23)
CO2: 25 mmol/L (ref 19–32)
Calcium: 9 mg/dL (ref 8.4–10.5)
Chloride: 99 mmol/L (ref 96–112)
Creatinine, Ser: 1.04 mg/dL (ref 0.50–1.10)
GFR, EST AFRICAN AMERICAN: 58 mL/min — AB (ref >90)
GFR, EST NON AFRICAN AMERICAN: 50 mL/min — AB (ref >90)
GLUCOSE: 114 mg/dL — AB (ref 70–99)
Potassium: 4.1 mmol/L (ref 3.5–5.1)
SODIUM: 135 mmol/L (ref 135–145)
TOTAL PROTEIN: 6.2 g/dL (ref 6.0–8.3)

## 2014-04-06 LAB — URINALYSIS, ROUTINE W REFLEX MICROSCOPIC
Bilirubin Urine: NEGATIVE
GLUCOSE, UA: NEGATIVE mg/dL
Hgb urine dipstick: NEGATIVE
KETONES UR: NEGATIVE mg/dL
LEUKOCYTES UA: NEGATIVE
Nitrite: NEGATIVE
Protein, ur: NEGATIVE mg/dL
Specific Gravity, Urine: 1.008 (ref 1.005–1.030)
UROBILINOGEN UA: 0.2 mg/dL (ref 0.0–1.0)
pH: 7.5 (ref 5.0–8.0)

## 2014-04-06 LAB — CBC WITH DIFFERENTIAL/PLATELET
BASOS ABS: 0 10*3/uL (ref 0.0–0.1)
Basophils Relative: 0 % (ref 0–1)
EOS ABS: 0.1 10*3/uL (ref 0.0–0.7)
Eosinophils Relative: 1 % (ref 0–5)
HEMATOCRIT: 34.7 % — AB (ref 36.0–46.0)
Hemoglobin: 11.8 g/dL — ABNORMAL LOW (ref 12.0–15.0)
Lymphocytes Relative: 10 % — ABNORMAL LOW (ref 12–46)
Lymphs Abs: 0.6 10*3/uL — ABNORMAL LOW (ref 0.7–4.0)
MCH: 32.5 pg (ref 26.0–34.0)
MCHC: 34 g/dL (ref 30.0–36.0)
MCV: 95.6 fL (ref 78.0–100.0)
MONO ABS: 0.4 10*3/uL (ref 0.1–1.0)
MONOS PCT: 6 % (ref 3–12)
NEUTROS ABS: 4.8 10*3/uL (ref 1.7–7.7)
Neutrophils Relative %: 83 % — ABNORMAL HIGH (ref 43–77)
Platelets: 237 10*3/uL (ref 150–400)
RBC: 3.63 MIL/uL — AB (ref 3.87–5.11)
RDW: 12.6 % (ref 11.5–15.5)
WBC: 5.8 10*3/uL (ref 4.0–10.5)

## 2014-04-06 LAB — CBG MONITORING, ED: Glucose-Capillary: 125 mg/dL — ABNORMAL HIGH (ref 70–99)

## 2014-04-06 LAB — TROPONIN I: Troponin I: <0.03 ng/mL (ref <0.031)

## 2014-04-06 MED ORDER — FLUOXETINE HCL 10 MG PO CAPS
10.0000 mg | ORAL_CAPSULE | Freq: Every day | ORAL | Status: DC
  Administered 2014-04-07 – 2014-04-08 (×2): 10 mg via ORAL
  Filled 2014-04-06 (×2): qty 1

## 2014-04-06 MED ORDER — CLOPIDOGREL BISULFATE 75 MG PO TABS
75.0000 mg | ORAL_TABLET | Freq: Every day | ORAL | Status: DC
  Administered 2014-04-07 – 2014-04-08 (×2): 75 mg via ORAL
  Filled 2014-04-06 (×2): qty 1

## 2014-04-06 MED ORDER — ASPIRIN 81 MG PO CHEW
81.0000 mg | CHEWABLE_TABLET | Freq: Every day | ORAL | Status: DC
  Administered 2014-04-06 – 2014-04-08 (×3): 81 mg via ORAL
  Filled 2014-04-06 (×3): qty 1

## 2014-04-06 MED ORDER — ENSURE ENLIVE PO LIQD
237.0000 mL | Freq: Two times a day (BID) | ORAL | Status: DC
  Administered 2014-04-07: 237 mL via ORAL

## 2014-04-06 MED ORDER — IRBESARTAN 300 MG PO TABS
300.0000 mg | ORAL_TABLET | Freq: Every day | ORAL | Status: DC
  Administered 2014-04-07: 300 mg via ORAL
  Filled 2014-04-06: qty 1

## 2014-04-06 MED ORDER — MECLIZINE HCL 25 MG PO TABS
25.0000 mg | ORAL_TABLET | Freq: Three times a day (TID) | ORAL | Status: DC | PRN
  Filled 2014-04-06: qty 1

## 2014-04-06 MED ORDER — ENOXAPARIN SODIUM 40 MG/0.4ML ~~LOC~~ SOLN
40.0000 mg | SUBCUTANEOUS | Status: DC
Start: 2014-04-06 — End: 2014-04-08
  Filled 2014-04-06 (×3): qty 0.4

## 2014-04-06 MED ORDER — ATORVASTATIN CALCIUM 80 MG PO TABS
80.0000 mg | ORAL_TABLET | Freq: Every morning | ORAL | Status: DC
  Administered 2014-04-07 – 2014-04-08 (×2): 80 mg via ORAL
  Filled 2014-04-06 (×2): qty 1

## 2014-04-06 NOTE — ED Provider Notes (Signed)
This patient's care was assumed from Alyse Low, PA-C at shift change. Please see her note for further.  This patient presents to the ED complaining of weakness that she reports felt similar to her past stroke. She had a near syncopal episode after having a bowel movement. We are waiting on her UA and for neurology to see. Head CT showed no acute findings. Chest x-ray showed subtle increased density in the right infrahilar region that suggests subsegmental atelectasis with no infiltrate evident. Patient has no symptoms of pneumonia. Her urinalysis is unremarkable. I spoke with neurologist Dr. Doy Mince who feels like the patient would benefit from starting on daily aspirin regimen. She did not feel the patient would benefit from MRI brain at this point. She reports if it was decided for admission that she would continue to follow the patient as needed.  At my evaluation the patient reports all her symptoms have resolved. Dr. Wyvonnia Dusky feels the patient would benefit for observation admission for her near syncope. The family and patient are in agreement with this. Will consult for admission at this time.  Spoke with Ebony Hail from [facility] who will admit the patient.  This patient was discussed with Dr. Wyvonnia Dusky who agrees with assessment and plan.   Results for orders placed or performed during the hospital encounter of 04/06/14  CBC with Differential/Platelet  Result Value Ref Range   WBC 5.8 4.0 - 10.5 K/uL   RBC 3.63 (L) 3.87 - 5.11 MIL/uL   Hemoglobin 11.8 (L) 12.0 - 15.0 g/dL   HCT 34.7 (L) 36.0 - 46.0 %   MCV 95.6 78.0 - 100.0 fL   MCH 32.5 26.0 - 34.0 pg   MCHC 34.0 30.0 - 36.0 g/dL   RDW 12.6 11.5 - 15.5 %   Platelets 237 150 - 400 K/uL   Neutrophils Relative % 83 (H) 43 - 77 %   Neutro Abs 4.8 1.7 - 7.7 K/uL   Lymphocytes Relative 10 (L) 12 - 46 %   Lymphs Abs 0.6 (L) 0.7 - 4.0 K/uL   Monocytes Relative 6 3 - 12 %   Monocytes Absolute 0.4 0.1 - 1.0 K/uL   Eosinophils Relative 1  0 - 5 %   Eosinophils Absolute 0.1 0.0 - 0.7 K/uL   Basophils Relative 0 0 - 1 %   Basophils Absolute 0.0 0.0 - 0.1 K/uL  Comprehensive metabolic panel  Result Value Ref Range   Sodium 135 135 - 145 mmol/L   Potassium 4.1 3.5 - 5.1 mmol/L   Chloride 99 96 - 112 mmol/L   CO2 25 19 - 32 mmol/L   Glucose, Bld 114 (H) 70 - 99 mg/dL   BUN 9 6 - 23 mg/dL   Creatinine, Ser 1.04 0.50 - 1.10 mg/dL   Calcium 9.0 8.4 - 10.5 mg/dL   Total Protein 6.2 6.0 - 8.3 g/dL   Albumin 3.6 3.5 - 5.2 g/dL   AST 25 0 - 37 U/L   ALT 21 0 - 35 U/L   Alkaline Phosphatase 89 39 - 117 U/L   Total Bilirubin 0.6 0.3 - 1.2 mg/dL   GFR calc non Af Amer 50 (L) >90 mL/min   GFR calc Af Amer 58 (L) >90 mL/min   Anion gap 11 5 - 15  Troponin I  Result Value Ref Range   Troponin I <0.03 <0.031 ng/mL  Urinalysis, Routine w reflex microscopic  Result Value Ref Range   Color, Urine YELLOW YELLOW   APPearance CLEAR CLEAR  Specific Gravity, Urine 1.008 1.005 - 1.030   pH 7.5 5.0 - 8.0   Glucose, UA NEGATIVE NEGATIVE mg/dL   Hgb urine dipstick NEGATIVE NEGATIVE   Bilirubin Urine NEGATIVE NEGATIVE   Ketones, ur NEGATIVE NEGATIVE mg/dL   Protein, ur NEGATIVE NEGATIVE mg/dL   Urobilinogen, UA 0.2 0.0 - 1.0 mg/dL   Nitrite NEGATIVE NEGATIVE   Leukocytes, UA NEGATIVE NEGATIVE  CBG monitoring, ED  Result Value Ref Range   Glucose-Capillary 125 (H) 70 - 99 mg/dL      Waynetta Pean, PA-C 04/06/14 1656  Ezequiel Essex, MD 04/06/14 [phone]

## 2014-04-06 NOTE — ED Notes (Signed)
Pt monitored by pulse ox, bp cuff, and 12-lead. 

## 2014-04-06 NOTE — Consult Note (Signed)
Referring Physician: Roderic Palau    Chief Complaint: Transient dizziness, AMS, diaphoresis  HPI:                                                                                                                                         [name] is an 81 y.o. female who recently was in hospital 01/31/2014 for a acute left cerebellar infarct. At that time her symptoms included nausea vomiting and dizziness. Patient had full work up at that time and was placed on Plavix. All though there was a question of PAF versus embolic source a full embolic work up was not done as patient was a fall risk and not a AC candidate.  Patient has been doing well with no significant residual effects other than a drop foot on the LEFT foot.  Over the last few days daughter noted her BP has been elevated asystolically in the 157-262'M but diabolically in the 35'D.  Today at around 11:30 her granddaughter noted patient became confused, unable to name her son, diaphoretic, dizzy (with no vertigo).  She was going to go to her PCP for elevated BP but due to new symptoms very much like her old symptoms she was brought to ED.  Symptoms have fully resolved.   Date last known well: Date: 04/06/2014 Time last known well: Time: 11:30 tPA Given: No: symptoms resolved.  Modified Rankin: Rankin Score=0  Previous stroke work up.   Carotid Doppler There is 1-39% bilateral ICA stenosis. Vertebral artery flow is antegrade.   2D echo - Left ventricle: The cavity size was normal. Wall thickness was normal. Systolic function was normal. The estimated ejection fraction was in the range of 55% to 60%. Wall motion was normal; there were no regional wall motion abnormalities. Doppler parameters are consistent with abnormal left ventricular relaxation (grade 1 diastolic dysfunction).  Impressions: - Normal LV function; grade 1 diastolic dysfunction; no significant valvular regurgitation noted.  MRI head:  02/01/2014  IMPRESSION: MRI HEAD: Acute LEFT posterior inferior cerebellar artery territory infarct with petechial hemorrhage. Scattered foci of susceptibility artifact in a pattern suggesting sequelae of chronic hypertension. Acute sphenoid sinusitis. MRA HEAD: Thready irregular LEFT posterior-inferior cerebellar artery, this could reflect atherosclerosis or mass effect from acute infarct. Mid grade stenosis of RIGHT P2 segment with luminal irregularity of the bilateral posterior cerebral arteries consistent with atherosclerosis.   MRA: Thready L PICA, R P2 high grade stenosis   CTA head and neck: showed left VA athero and bilateral ICA athero  LDL 71 A1c 5.6    Past Medical History  Diagnosis Date  . Hypertension   . Hyperlipemia   . Dementia   . Cerebral hemorrhage, nontraumatic 2011  . CVA (cerebral infarction) 2011    left cerebellum  . Ureterocele, acquired   . Broken arm     left x 3  . Brain swelling 2016  . History of pubovaginal  sling   . Depression   . Chronic kidney disease     stage 3    Past Surgical History  Procedure Laterality Date  . Abdominal hysterectomy    . Cholecystectomy    . Fracture surgery Left     3 fractures in past  . Other surgical history  2006    "bladder tac"    Family History  Problem Relation Age of Onset  . Stroke Mother   . Diabetes Mother   . Hyperlipidemia Mother   . Hypertension Mother   . Stroke Father   . Heart disease Father   . Hyperlipidemia Father   . Depression Sister   . Cancer Brother 40    lung   Social History:  reports that she has quit smoking. She does not have any smokeless tobacco history on file. She reports that she does not drink alcohol or use illicit drugs.  Allergies:  Allergies  Allergen Reactions  . Penicillins     Reaction: unknown  . Latex Rash    Medications:                                                                                                                           No current  facility-administered medications for this encounter.   Current Outpatient Prescriptions  Medication Sig Dispense Refill  . alendronate (FOSAMAX) 70 MG tablet Take 1 tablet (70 mg total) by mouth once a week. (Patient taking differently: Take 70 mg by mouth once a week. On Thursdays) 12 tablet 1  . atorvastatin (LIPITOR) 80 MG tablet Take 1 tablet (80 mg total) by mouth every morning. 90 tablet 1  . cholecalciferol (VITAMIN D) 1000 UNITS tablet Take 1,000 Units by mouth 2 (two) times daily.    . clopidogrel (PLAVIX) 75 MG tablet Take 1 tablet (75 mg total) by mouth daily. 30 tablet 1  . FLUoxetine (PROZAC) 10 MG capsule Take 1 capsule (10 mg total) by mouth daily. 90 capsule 1  . irbesartan (AVAPRO) 300 MG tablet Take 1 tablet (300 mg total) by mouth daily. 90 tablet 1  . Multiple Vitamin (MULTIVITAMIN WITH MINERALS) TABS Take 1 tablet by mouth daily.    . vitamin B-12 (CYANOCOBALAMIN) 1000 MCG tablet Take 1,000 mcg by mouth daily.    . meclizine (ANTIVERT) 25 MG tablet Take 1 tablet (25 mg total) by mouth 3 (three) times daily as needed for dizziness. 60 tablet 0  . omeprazole (PRILOSEC) 40 MG capsule Take 1 capsule (40 mg total) by mouth every morning. 30 capsule 1  . oxyCODONE-acetaminophen (PERCOCET/ROXICET) 5-325 MG per tablet Take 1 tablet by mouth every 6 (six) hours as needed for moderate pain or severe pain. (Patient not taking: Reported on 03/31/2014) 60 tablet 0     ROS:  History obtained from the patient  General ROS: negative for - chills, fatigue, fever, night sweats, weight gain or weight loss Psychological ROS: negative for - behavioral disorder, hallucinations, memory difficulties, mood swings or suicidal ideation Ophthalmic ROS: negative for - blurry vision, double vision, eye pain or loss of vision ENT ROS: negative for - epistaxis, nasal  discharge, oral lesions, sore throat, tinnitus or vertigo Allergy and Immunology ROS: negative for - hives or itchy/watery eyes Hematological and Lymphatic ROS: negative for - bleeding problems, bruising or swollen lymph nodes Endocrine ROS: negative for - galactorrhea, hair pattern changes, polydipsia/polyuria or temperature intolerance Respiratory ROS: negative for - cough, hemoptysis, shortness of breath or wheezing Cardiovascular ROS: negative for - chest pain, dyspnea on exertion, edema or irregular heartbeat Gastrointestinal ROS: negative for - abdominal pain, diarrhea, hematemesis, nausea/vomiting or stool incontinence Genito-Urinary ROS: negative for - dysuria, hematuria, incontinence or urinary frequency/urgency Musculoskeletal ROS: negative for - joint swelling or muscular weakness Neurological ROS: as noted in HPI Dermatological ROS: negative for rash and skin lesion changes  Neurologic Examination:                                                                                                      Blood pressure 151/57, pulse 62, temperature 98.2 F (36.8 C), temperature source Oral, resp. rate 12, height 5\' 4"  (1.626 m), weight 58.968 kg (130 lb), SpO2 95 %.  HEENT-  Normocephalic, no lesions, without obvious abnormality.  Normal external eye and conjunctiva.  Normal TM's bilaterally.  Normal auditory canals and external ears. Normal external nose, mucus membranes and septum.  Normal pharynx. Cardiovascular- S1, S2 normal, pulses palpable throughout   Lungs- chest clear, no wheezing, rales, normal symmetric air entry Abdomen- normal findings: bowel sounds normal Extremities- no edema Lymph-no adenopathy palpable Musculoskeletal-no joint tenderness, deformity or swelling Skin-warm and dry, no hyperpigmentation, vitiligo, or suspicious lesions  Neurological Examination Mental Status: Alert, oriented to hospital, March but believes it is 2060, thought content appropriate.  Speech  fluent without evidence of aphasia.  Able to follow 3 step commands without difficulty. Cranial Nerves: II: Discs flat bilaterally; Visual fields grossly normal, pupils equal, round, reactive to light and accommodation III,IV, VI: ptosis not present, extra-ocular motions intact bilaterally V,VII: smile symmetric, facial light touch sensation normal bilaterally VIII: hearing normal bilaterally IX,X: uvula rises symmetrically XI: bilateral shoulder shrug XII: midline tongue extension Motor: Right : Upper extremity   5/5    Left:     Upper extremity   5/5  Lower extremity   5/5     Lower extremity   5/5 --left foot drop Tone and bulk:normal tone throughout; no atrophy noted Sensory: Pinprick and light touch intact throughout, bilaterally Deep Tendon Reflexes: 2+ and symmetric throughout UE 1+ bilateral KJ and no AJ Plantars: Right: downgoing   Left: upgoing Cerebellar: normal finger-to-nose,  and normal heel-to-shin test Gait: not tested due to fall risk.        Lab Results: Basic Metabolic Panel: No results for input(s): NA, K, CL, CO2, GLUCOSE, BUN, CREATININE, CALCIUM, MG, PHOS in  the last 168 hours.  Liver Function Tests: No results for input(s): AST, ALT, ALKPHOS, BILITOT, PROT, ALBUMIN in the last 168 hours. No results for input(s): LIPASE, AMYLASE in the last 168 hours. No results for input(s): AMMONIA in the last 168 hours.  CBC:  Recent Labs Lab 04/06/14 1512  WBC 5.8  NEUTROABS 4.8  HGB 11.8*  HCT 34.7*  MCV 95.6  PLT 237    Cardiac Enzymes: No results for input(s): CKTOTAL, CKMB, CKMBINDEX, TROPONINI in the last 168 hours.  Lipid Panel: No results for input(s): CHOL, TRIG, HDL, CHOLHDL, VLDL, LDLCALC in the last 168 hours.  CBG:  Recent Labs Lab 04/06/14 1333  GLUCAP 125*    Microbiology: Results for orders placed or performed during the hospital encounter of 02/09/14  Culture, Urine     Status: None   Collection Time: 02/11/14  9:26 PM   Result Value Ref Range Status   Specimen Description URINE, CLEAN CATCH  Final   Special Requests NONE  Final   Colony Count   Final    >=100,000 COLONIES/ML Performed at [facility]    Culture   Final    ESCHERICHIA COLI Performed at [facility]    Report Status 02/14/2014 FINAL  Final   Organism ID, Bacteria ESCHERICHIA COLI  Final      Susceptibility   Escherichia coli - MIC*    AMPICILLIN <=2 SENSITIVE Sensitive     CEFAZOLIN <=4 SENSITIVE Sensitive     CEFTRIAXONE <=1 SENSITIVE Sensitive     CIPROFLOXACIN <=0.25 SENSITIVE Sensitive     GENTAMICIN <=1 SENSITIVE Sensitive     LEVOFLOXACIN <=0.12 SENSITIVE Sensitive     NITROFURANTOIN <=16 SENSITIVE Sensitive     TOBRAMYCIN <=1 SENSITIVE Sensitive     TRIMETH/SULFA <=20 SENSITIVE Sensitive     PIP/TAZO <=4 SENSITIVE Sensitive     * ESCHERICHIA COLI    Coagulation Studies: No results for input(s): LABPROT, INR in the last 72 hours.  Imaging: Dg Chest 2 View  04/06/2014   CLINICAL DATA:  One-day history of bilateral lower extremity weakness ; history of previous CVA, hypertension, previous tobacco use.  EXAM: CHEST  2 VIEW  COMPARISON:  Portable chest x-ray of February 05, 2014  FINDINGS: The lungs are adequately inflated. There is subtle increased density in the right infrahilar region which may reflect atelectasis or early infiltrate. It is not clearly evident on the lateral film. The cardiac silhouette is top-normal in size but stable. The pulmonary vascularity is normal. The mediastinum is normal in width. There is no pleural effusion. The bony thorax exhibits no acute abnormality. There is old partial compression of the body of T7.  IMPRESSION: Subtle increased density in the right infrahilar region suggests subsegmental atelectasis or scarring. No alveolar infiltrate is evident. There is underlying COPD. There is no evidence of CHF.   Electronically Signed   By: David  Martinique   On: 04/06/2014 14:52   Ct  Head Wo Contrast  04/06/2014   CLINICAL DATA:  Weakness for the past hr.  Stroke last month.  EXAM: CT HEAD WITHOUT CONTRAST  TECHNIQUE: Contiguous axial images were obtained from the base of the skull through the vertex without intravenous contrast.  COMPARISON:  02/07/2014  FINDINGS: Encephalomalacia from left PICA distribution stroke noted, with expected evolutionary changes compared to 02/07/14. No visible hemorrhage in this vicinity.  Stable linear calcifications in the globus pallidus nuclei, probably physiologic.  Periventricular white matter and corona radiata hypodensities favor chronic ischemic  microvascular white matter disease.  Chronic sphenoid and posterior ethmoid sinusitis.  No intracranial hemorrhage, mass lesion, or acute CVA.  IMPRESSION: 1. Encephalomalacia from prior PICA infarct. No acute findings identified. 2. Chronic sphenoid and ethmoid sinusitis. 3. Periventricular white matter and corona radiata hypodensities favor chronic ischemic microvascular white matter disease.   Electronically Signed   By: Van Clines M.D.   On: 04/06/2014 14:40    Etta Quill PA-C [facility] [phone]  04/06/2014, 3:51 PM   Patient seen and examined.  Clinical course and management discussed.  Necessary edits performed.  I agree with the above.  Assessment and plan of care developed and discussed below.     Assessment: 81 y.o. female with an episode of dizziness and confusion similar to her presentation in January of this year.  Symptoms have resolved.  Head CT personally reviewed and shows no acute changes.  Patient with recent unrevealing stroke work up.  On Plavix at home.  CVA and TIA are on the differential.  Patient has been deemed not a anticoagulant candidate.  With that in mind repeat work up is not indicated at this time.    Stroke Risk Factors - hyperlipidemia and hypertension  Recommendations: 1.  Continue Plavix 75 mg daily 2.  Start ASA 81 mg daily 3.  Patient to  continue follow up with neurology and with PCP for continued work on BP control   Alexis Goodell, MD [facility] [phone]  04/06/2014  4:36 PM

## 2014-04-06 NOTE — ED Notes (Signed)
Attempted to ambulate patient, patient very unsteady on feet and could only take a few steps. Patient's family says its unusual for patient to be so unsteady.

## 2014-04-06 NOTE — ED Notes (Signed)
Patient come from home with complaints of weakness for the past hour.  EMS states nausea but denies vomiting. Patient has Hx of stroke with left lower extremities weakness. EMS states patient is positive for orthostatics. Patient recived 4mg  of zofran with EMS. Patient alert & O x4.

## 2014-04-06 NOTE — ED Notes (Signed)
CBG = 125  RN Janee informed of result.

## 2014-04-06 NOTE — H&P (Signed)
[facility] History and Physical                                                                                    [name], is a 81 y.o. female  MRN: [id number]   DOB - 1933-05-05  Admit Date - 04/06/2014  Outpatient Primary MD for the patient is [name], NP  With History of -  Past Medical History  Diagnosis Date  . Hypertension   . Hyperlipemia   . Dementia   . Cerebral hemorrhage, nontraumatic 2011  . CVA (cerebral infarction) 2011    left cerebellum  . Ureterocele, acquired   . Broken arm     left x 3  . Brain swelling 2016  . History of pubovaginal sling   . Depression   . Chronic kidney disease     stage 3      Past Surgical History  Procedure Laterality Date  . Abdominal hysterectomy    . Cholecystectomy    . Fracture surgery Left     3 fractures in past  . Other surgical history  2006    "bladder tac"    in for   Chief Complaint  Patient presents with  . Weakness     HPI 81 year old female patient who was admitted 01/31/2014 for acute left cerebellar infarct. Her presenting symptoms at that time included nausea, vomiting and dizziness. For workup was completed and patient was placed on Plavix. There was a question of PAF versus embolic source but a full embolic workup was not pursued as the patient was a fall risk and a chronic anticoagulation candidate. Patient has been in her usual state of health except for family noticing that blood pressures typically 170 to 626 range systolically with lower diastolic readings with apparently the systolic blood pressure dropping somewhat and the patient is up and ambulating. Today while the patient was utilizing the restroom the patient became diaphoretic, confused and dizzy without vertigo symptoms. By the time the patient presented to the ER the symptoms had resolved. She presented to the ER because the symptoms were similar to her recent stroke admission symptoms.  In the ER her blood  pressure was 154/53. Chest x-ray was unremarkable except for a possible increased density in the right infrahilar region consistent likely with scarring. No obvious infiltrate. EKG revealed sinus rhythm with normal QTC. In further questioning patient does not recall any issues with shortness of breath, tachycardia palpitations or chest pain with these symptoms. Further history reveals patient's Prilosec and been discontinued by her primary care physician and due to interaction with Plavix. Patient eats small amounts of food at home but seems to drink adequate amount of fluids. She does report some loose stools today. Since her previous admission she had begun wearing TED hose regularly.  She was evaluated by the neurology service who felt that additional workup was not indicated (please see neurology physician note). Neurology did recommend adding a baby aspirin to Plavix.  Review of Systems   In addition to the HPI above,  No Fever-chills, myalgias or other constitutional symptoms No Headache, changes with Vision or hearing, new weakness, tingling, numbness in any extremity;  vasovagal type symptoms as described above No problems swallowing food or Liquids, indigestion/reflux No Chest pain, Cough or Shortness of Breath, palpitations, orthopnea or DOE No Abdominal pain, N/V; no melena or hematochezia, no dark tarry stools, Bowel movements are regular, No dysuria, hematuria or flank pain No new skin rashes, lesions, masses or bruises, No new joints pains-aches No recent weight gain or loss No polyuria, polydypsia or polyphagia,  *A full 10 point Review of Systems was done, except as stated above, all other Review of Systems were negative.  Social History History  Substance Use Topics  . Smoking status: Former Research scientist (life sciences)  . Smokeless tobacco: Not on file  . Alcohol Use: No    Family History Family History  Problem Relation Age of Onset  . Stroke Mother   . Diabetes Mother   . Hyperlipidemia  Mother   . Hypertension Mother   . Stroke Father   . Heart disease Father   . Hyperlipidemia Father   . Depression Sister   . Cancer Brother 40    lung    Prior to Admission medications   Medication Sig Start Date End Date Taking? Authorizing Provider  alendronate (FOSAMAX) 70 MG tablet Take 1 tablet (70 mg total) by mouth once a week. Patient taking differently: Take 70 mg by mouth once a week. On Thursdays 03/31/14  Yes Debbrah Alar, NP  atorvastatin (LIPITOR) 80 MG tablet Take 1 tablet (80 mg total) by mouth every morning. 03/31/14  Yes Debbrah Alar, NP  cholecalciferol (VITAMIN D) 1000 UNITS tablet Take 1,000 Units by mouth 2 (two) times daily.   Yes Historical Provider, MD  clopidogrel (PLAVIX) 75 MG tablet Take 1 tablet (75 mg total) by mouth daily. 02/23/14  Yes Daniel J Angiulli, PA-C  FLUoxetine (PROZAC) 10 MG capsule Take 1 capsule (10 mg total) by mouth daily. 03/31/14  Yes Debbrah Alar, NP  irbesartan (AVAPRO) 300 MG tablet Take 1 tablet (300 mg total) by mouth daily. 03/31/14  Yes Debbrah Alar, NP  Multiple Vitamin (MULTIVITAMIN WITH MINERALS) TABS Take 1 tablet by mouth daily.   Yes Historical Provider, MD  vitamin B-12 (CYANOCOBALAMIN) 1000 MCG tablet Take 1,000 mcg by mouth daily.   Yes Historical Provider, MD  meclizine (ANTIVERT) 25 MG tablet Take 1 tablet (25 mg total) by mouth 3 (three) times daily as needed for dizziness. 02/23/14   Lavon Paganini Angiulli, PA-C  omeprazole (PRILOSEC) 40 MG capsule Take 1 capsule (40 mg total) by mouth every morning. 02/23/14   Lavon Paganini Angiulli, PA-C  oxyCODONE-acetaminophen (PERCOCET/ROXICET) 5-325 MG per tablet Take 1 tablet by mouth every 6 (six) hours as needed for moderate pain or severe pain. Patient not taking: Reported on 03/31/2014 02/23/14   Lavon Paganini Angiulli, PA-C    Allergies  Allergen Reactions  . Penicillins     Reaction: unknown  . Latex Rash    Physical Exam  Vitals  Blood pressure 153/58, pulse 83,  temperature 98.2 F (36.8 C), temperature source Oral, resp. rate 17, height 5\' 4"  (1.626 m), weight 130 lb (58.968 kg), SpO2 97 %.   General:  In no acute distress, appears healthy and well nourished  Psych:  Normal affect, Denies Suicidal or Homicidal ideations, Awake Alert, Oriented X 3. Speech and thought patterns are clear and appropriate  Neuro:   No focal neurological deficits, CN II through XII intact, Strength 5/5 all 4 extremities, Sensation intact all 4 extremities. Subtle left foot drop  ENT:  Ears and Eyes appear Normal, Conjunctivae  clear, PER. Moist oral mucosa without erythema or exudates.  Neck:  Supple, No lymphadenopathy appreciated  Respiratory:  Symmetrical chest wall movement, Good air movement bilaterally, CTAB. Room Air  Cardiac:  RRR, No Murmurs, no LE edema noted, no JVD, No carotid bruits, peripheral pulses palpable at 2+  Abdomen:  Positive bowel sounds, Soft, Non tender, Non distended,  No masses appreciated, no obvious hepatosplenomegaly  Skin:  No Cyanosis, Normal Skin Turgor, No Skin Rash or Bruise.  Extremities: Symmetrical without obvious trauma or injury,  no effusions.  Data Review  CBC  Recent Labs Lab 04/06/14 1512  WBC 5.8  HGB 11.8*  HCT 34.7*  PLT 237  MCV 95.6  MCH 32.5  MCHC 34.0  RDW 12.6  LYMPHSABS 0.6*  MONOABS 0.4  EOSABS 0.1  BASOSABS 0.0    Chemistries   Recent Labs Lab 04/06/14 1512  NA 135  K 4.1  CL 99  CO2 25  GLUCOSE 114*  BUN 9  CREATININE 1.04  CALCIUM 9.0  AST 25  ALT 21  ALKPHOS 89  BILITOT 0.6    estimated creatinine clearance is 37.9 mL/min (by C-G formula based on Cr of 1.04).  No results for input(s): TSH, T4TOTAL, T3FREE, THYROIDAB in the last 72 hours.  Invalid input(s): FREET3  Coagulation profile No results for input(s): INR, PROTIME in the last 168 hours.  No results for input(s): DDIMER in the last 72 hours.  Cardiac Enzymes  Recent Labs Lab 04/06/14 1512  TROPONINI  <0.03    Invalid input(s): POCBNP  Urinalysis    Component Value Date/Time   COLORURINE YELLOW 04/06/2014 1603   APPEARANCEUR CLEAR 04/06/2014 1603   LABSPEC 1.008 04/06/2014 1603   PHURINE 7.5 04/06/2014 1603   GLUCOSEU NEGATIVE 04/06/2014 1603   HGBUR NEGATIVE 04/06/2014 1603   BILIRUBINUR NEGATIVE 04/06/2014 1603   KETONESUR NEGATIVE 04/06/2014 1603   PROTEINUR NEGATIVE 04/06/2014 1603   UROBILINOGEN 0.2 04/06/2014 1603   NITRITE NEGATIVE 04/06/2014 1603   LEUKOCYTESUR NEGATIVE 04/06/2014 1603    Imaging results:   Dg Chest 2 View  04/06/2014   CLINICAL DATA:  One-day history of bilateral lower extremity weakness ; history of previous CVA, hypertension, previous tobacco use.  EXAM: CHEST  2 VIEW  COMPARISON:  Portable chest x-ray of February 05, 2014  FINDINGS: The lungs are adequately inflated. There is subtle increased density in the right infrahilar region which may reflect atelectasis or early infiltrate. It is not clearly evident on the lateral film. The cardiac silhouette is top-normal in size but stable. The pulmonary vascularity is normal. The mediastinum is normal in width. There is no pleural effusion. The bony thorax exhibits no acute abnormality. There is old partial compression of the body of T7.  IMPRESSION: Subtle increased density in the right infrahilar region suggests subsegmental atelectasis or scarring. No alveolar infiltrate is evident. There is underlying COPD. There is no evidence of CHF.   Electronically Signed   By: David  Martinique   On: 04/06/2014 14:52   Ct Head Wo Contrast  04/06/2014   CLINICAL DATA:  Weakness for the past hr.  Stroke last month.  EXAM: CT HEAD WITHOUT CONTRAST  TECHNIQUE: Contiguous axial images were obtained from the base of the skull through the vertex without intravenous contrast.  COMPARISON:  02/07/2014  FINDINGS: Encephalomalacia from left PICA distribution stroke noted, with expected evolutionary changes compared to 02/07/14. No visible  hemorrhage in this vicinity.  Stable linear calcifications in the globus pallidus nuclei, probably physiologic.  Periventricular white matter and corona radiata hypodensities favor chronic ischemic microvascular white matter disease.  Chronic sphenoid and posterior ethmoid sinusitis.  No intracranial hemorrhage, mass lesion, or acute CVA.  IMPRESSION: 1. Encephalomalacia from prior PICA infarct. No acute findings identified. 2. Chronic sphenoid and ethmoid sinusitis. 3. Periventricular white matter and corona radiata hypodensities favor chronic ischemic microvascular white matter disease.   Electronically Signed   By: Van Clines M.D.   On: 04/06/2014 14:40     EKG: Sinus rhythm with normal QTC   Assessment & Plan  Principal Problem:   Vasovagal near syncope -Admit to telemetry -No focal neurological deficits -Symptoms sound consistent with vasovagal etiology unclear for related to unintentional straining during bowel movements or related to orthostasis -Previously patient has been on Antivert which we have reordered -We'll check orthostatic vital signs before ordering fluids -Patient may have isolated systolic hypertension with associated low diastolic blood pressure which makes complete correction of the systolic reading difficult and may contribute to orthostatic symptoms; patient is currently utilizing TED hose which is one of the typical treatments for this condition-I also discussed lifestyle changes such as repositioning from supine and seated positions slowly  Active Problems:   History of CVA (cerebrovascular accident) -Current CT the head and clinical exam not consistent with recurrent CVA -Neurology recommends adding baby aspirin to Plavix -No additional workup indicated    Essential hypertension -As above, current blood pressure controlled therefore will continue home Avapro    Malnutrition of moderate degree -May benefit from formal nutritional evaluation during this  hospitalization    Dementia -Patient is not on dementia specific medications but she does take Prozac noting this class of drug can sometimes cause orthostasis    Obstructive hydrocephalus    DVT Prophylaxis: Lovenox  Family Communication: Daughter and granddaughter at bedside    Code Status:  Full Code  Condition:  Stable  Time spent in minutes : 60   ELLIS,ALLISON L. ANP on 04/06/2014 at 5:10 PM  Between 7am to 7pm - Pager - [phone]  After 7pm go to [email or web address] - password [email or web address]  And look for the night coverage person covering me after hours  [facility]

## 2014-04-06 NOTE — ED Provider Notes (Signed)
CSN: [id number]     Arrival date & time 04/06/14  1321 History   First MD Initiated Contact with Patient 04/06/14 1340     Chief Complaint  Patient presents with  . Weakness     (Consider location/radiation/quality/duration/timing/severity/associated sxs/prior Treatment) Patient is a 81 y.o. female presenting with weakness. The history is provided by the patient. No language interpreter was used.  Weakness This is a new problem. The current episode started today. The problem occurs intermittently. The problem has been rapidly improving. Associated symptoms include nausea and weakness. Nothing aggravates the symptoms. She has tried nothing for the symptoms. The treatment provided mild relief.  Family reports pt  Had trouble talking, unable to recognize family and difficulty finding words.  Pt had a stroke in January.   Family reports pt had the same symptoms then.  They are concerned that she has had a stroke today.  Pt has had elevated blood pressures.  Pt is on plavix.    Past Medical History  Diagnosis Date  . Hypertension   . Hyperlipemia   . Dementia   . Cerebral hemorrhage, nontraumatic 2011  . CVA (cerebral infarction) 2011    left cerebellum  . Ureterocele, acquired   . Broken arm     left x 3  . Brain swelling 2016  . History of pubovaginal sling   . Depression   . Chronic kidney disease     stage 3   Past Surgical History  Procedure Laterality Date  . Abdominal hysterectomy    . Cholecystectomy    . Fracture surgery Left     3 fractures in past  . Other surgical history  2006    "bladder tac"   Family History  Problem Relation Age of Onset  . Stroke Mother   . Diabetes Mother   . Hyperlipidemia Mother   . Hypertension Mother   . Stroke Father   . Heart disease Father   . Hyperlipidemia Father   . Depression Sister   . Cancer Brother 40    lung   History  Substance Use Topics  . Smoking status: Former Research scientist (life sciences)  . Smokeless tobacco: Not on file  .  Alcohol Use: No   OB History    No data available     Review of Systems  Gastrointestinal: Positive for nausea.  Neurological: Positive for weakness.  All other systems reviewed and are negative.     Allergies  Penicillins and Latex  Home Medications   Prior to Admission medications   Medication Sig Start Date End Date Taking? Authorizing Provider  alendronate (FOSAMAX) 70 MG tablet Take 1 tablet (70 mg total) by mouth once a week. 03/31/14   Debbrah Alar, NP  atorvastatin (LIPITOR) 80 MG tablet Take 1 tablet (80 mg total) by mouth every morning. 03/31/14   Debbrah Alar, NP  cholecalciferol (VITAMIN D) 1000 UNITS tablet Take 1,000 Units by mouth 2 (two) times daily.    Historical Provider, MD  clopidogrel (PLAVIX) 75 MG tablet Take 1 tablet (75 mg total) by mouth daily. 02/23/14   Lavon Paganini Angiulli, PA-C  FLUoxetine (PROZAC) 10 MG capsule Take 1 capsule (10 mg total) by mouth daily. 03/31/14   Debbrah Alar, NP  irbesartan (AVAPRO) 300 MG tablet Take 1 tablet (300 mg total) by mouth daily. 03/31/14   Debbrah Alar, NP  meclizine (ANTIVERT) 25 MG tablet Take 1 tablet (25 mg total) by mouth 3 (three) times daily as needed for dizziness. 02/23/14   Lavon Paganini  Angiulli, PA-C  Multiple Vitamin (MULTIVITAMIN WITH MINERALS) TABS Take 1 tablet by mouth daily.    Historical Provider, MD  omeprazole (PRILOSEC) 40 MG capsule Take 1 capsule (40 mg total) by mouth every morning. 02/23/14   Lavon Paganini Angiulli, PA-C  oxyCODONE-acetaminophen (PERCOCET/ROXICET) 5-325 MG per tablet Take 1 tablet by mouth every 6 (six) hours as needed for moderate pain or severe pain. Patient not taking: Reported on 03/31/2014 02/23/14   Lavon Paganini Angiulli, PA-C  vitamin B-12 (CYANOCOBALAMIN) 1000 MCG tablet Take 1,000 mcg by mouth daily.    Historical Provider, MD   BP 160/59 mmHg  Pulse 71  Temp(Src) 98.2 F (36.8 C) (Oral)  Resp 19  Ht 5\' 4"  (1.626 m)  Wt 130 lb (58.968 kg)  BMI 22.30 kg/m2  SpO2  100% Physical Exam  Constitutional: She is oriented to person, place, and time. She appears well-developed and well-nourished.  HENT:  Head: Normocephalic and atraumatic.  Right Ear: External ear normal.  Mouth/Throat: Oropharynx is clear and moist.  Eyes: Conjunctivae and EOM are normal. Pupils are equal, round, and reactive to light.  Neck: Normal range of motion.  Cardiovascular: Normal rate and normal heart sounds.   Pulmonary/Chest: Effort normal.  Abdominal: Soft. She exhibits no distension.  Musculoskeletal: Normal range of motion.  Neurological: She is alert and oriented to person, place, and time.  Skin: Skin is warm.  Psychiatric: She has a normal mood and affect.  Nursing note and vitals reviewed.   ED Course  Procedures (including critical care time) Labs Review Labs Reviewed  CBG MONITORING, ED - Abnormal; Notable for the following:    Glucose-Capillary 125 (*)    All other components within normal limits  CBC WITH DIFFERENTIAL/PLATELET  COMPREHENSIVE METABOLIC PANEL  TROPONIN I  URINALYSIS, ROUTINE W REFLEX MICROSCOPIC    Imaging Review Dg Chest 2 View  04/06/2014   CLINICAL DATA:  One-day history of bilateral lower extremity weakness ; history of previous CVA, hypertension, previous tobacco use.  EXAM: CHEST  2 VIEW  COMPARISON:  Portable chest x-ray of February 05, 2014  FINDINGS: The lungs are adequately inflated. There is subtle increased density in the right infrahilar region which may reflect atelectasis or early infiltrate. It is not clearly evident on the lateral film. The cardiac silhouette is top-normal in size but stable. The pulmonary vascularity is normal. The mediastinum is normal in width. There is no pleural effusion. The bony thorax exhibits no acute abnormality. There is old partial compression of the body of T7.  IMPRESSION: Subtle increased density in the right infrahilar region suggests subsegmental atelectasis or scarring. No alveolar infiltrate is  evident. There is underlying COPD. There is no evidence of CHF.   Electronically Signed   By: David  Martinique   On: 04/06/2014 14:52   Ct Head Wo Contrast  04/06/2014   CLINICAL DATA:  Weakness for the past hr.  Stroke last month.  EXAM: CT HEAD WITHOUT CONTRAST  TECHNIQUE: Contiguous axial images were obtained from the base of the skull through the vertex without intravenous contrast.  COMPARISON:  02/07/2014  FINDINGS: Encephalomalacia from left PICA distribution stroke noted, with expected evolutionary changes compared to 02/07/14. No visible hemorrhage in this vicinity.  Stable linear calcifications in the globus pallidus nuclei, probably physiologic.  Periventricular white matter and corona radiata hypodensities favor chronic ischemic microvascular white matter disease.  Chronic sphenoid and posterior ethmoid sinusitis.  No intracranial hemorrhage, mass lesion, or acute CVA.  IMPRESSION: 1. Encephalomalacia from prior  PICA infarct. No acute findings identified. 2. Chronic sphenoid and ethmoid sinusitis. 3. Periventricular white matter and corona radiata hypodensities favor chronic ischemic microvascular white matter disease.   Electronically Signed   By: Van Clines M.D.   On: 04/06/2014 14:40     EKG Interpretation   Date/Time:  Wednesday April 06 2014 13:21:34 EDT Ventricular Rate:  66 PR Interval:  155 QRS Duration: 74 QT Interval:  425 QTC Calculation: 445 R Axis:   22 Text Interpretation:  Sinus rhythm Supraventricular bigeminy Low voltage,  precordial leads Abnormal R-wave progression, early transition Borderline  T abnormalities, anterior leads No significant change was found Confirmed  by Wyvonnia Dusky  MD, STEPHEN [phone]) on 04/06/2014 1:35:19 PM      MDM   Final diagnoses:  Near syncope  Weakness    Dr, Doy Mince will see here and advise.    Fransico Meadow, PA-C 04/07/14 Jud, PA-C 04/07/14 [place], MD 04/07/14 [phone]

## 2014-04-06 NOTE — ED Notes (Signed)
Report attempted 

## 2014-04-06 NOTE — ED Notes (Signed)
Dr. Doy Mince at beside.

## 2014-04-06 NOTE — Telephone Encounter (Signed)
[facility] - Client TELEPHONE ADVICE RECORD [facility] Patient Name: AVYANA PUFFENBARGER DOB: 16-Oct-1933 Initial Comment Caller states her BP is 188/83. Shaking a little bit, daughter thinks it could be anxiety. From sitting to standing the top number dropped to 144. Concerned about her having another stroke. Wants to speak to a nurse now. Nurse Assessment Nurse: Mechele Dawley, RN, Amy Date/Time Eilene Ghazi Time): 04/06/2014 9:30:13 AM Confirm and document reason for call. If symptomatic, describe symptoms. ---DAUGHTER STATES THAT HER BP IS 188/83. DAUGHTER STATES THAT IT IS FROM ANXIETY. SHE IS NOT COMPLAINING FROM ANYTHING. DAUGHTER STATES THAT IT HAS BEEN CONSISTENTLY HIGH. SITTING TO STANDING BP SHE IS HAVING SIGNIFICANTLY DROPS. SHE WILL DROP DOWN TO 144. AFTER SHE HAD A STROKE SHE WENT FROM BEING HIGH TO REALLY LOW. SHE IS ASYMPTOMATIC WITH THE DROPS. SAW MD ON 24TH, NO CHANGES TO MEDICATIONS. Has the patient traveled out of the country within the last 30 days? ---Not Applicable Does the patient require triage? ---Yes Related visit to physician within the last 2 weeks? ---Yes Does the PT have any chronic conditions? (i.e. diabetes, asthma, etc.) ---Yes List chronic conditions. ---BP, OSTEOPOROSIS, STROKE - JAN 25TH 2016, 2015, 2014 Guidelines Guideline Title Affirmed Question Affirmed Notes High Blood Pressure BP 120-139 / 80-89 (all triage questions negative) Final Disposition User See Physician within 4 Hours (or PCP triage) [place], RN, Amy Comments SCHEDULED AN APPT FOR PATIENT TODAY AT 0626 - DAUGHTER WILL MAKE SURE SHE GETS THERE. CANCELLED THE APPT FOR TOMORROW THAT SHE HAD IN THE SYSTEM. SHE NEEDS TO BE SEEN TODAY AS SHE IS HAVING DROPS IN BP READINGS. DAUGHTER HAS A LOG OF ALL OF HER BP READINGS SINCE THE APPT WAS SCHEDULED LAST THURSDAY.

## 2014-04-06 NOTE — ED Notes (Signed)
Patient off the floor for testing sent by another staff member.

## 2014-04-06 NOTE — Telephone Encounter (Signed)
Just FYI  Patient had an appointment for today that was scheduled. Patient daughter called in stating that patient has had a stroke and is on her way to the ED. Appointment cancelled for today

## 2014-04-07 ENCOUNTER — Ambulatory Visit: Payer: Medicare Other | Admitting: Medical

## 2014-04-07 ENCOUNTER — Inpatient Hospital Stay (HOSPITAL_COMMUNITY): Payer: Medicare Other

## 2014-04-07 DIAGNOSIS — R531 Weakness: Secondary | ICD-10-CM | POA: Insufficient documentation

## 2014-04-07 DIAGNOSIS — R55 Syncope and collapse: Secondary | ICD-10-CM

## 2014-04-07 LAB — BASIC METABOLIC PANEL
ANION GAP: 7 (ref 5–15)
BUN: 6 mg/dL (ref 6–23)
CALCIUM: 8.5 mg/dL (ref 8.4–10.5)
CO2: 24 mmol/L (ref 19–32)
CREATININE: 0.99 mg/dL (ref 0.50–1.10)
Chloride: 106 mmol/L (ref 96–112)
GFR calc Af Amer: 61 mL/min — ABNORMAL LOW (ref >90)
GFR calc non Af Amer: 53 mL/min — ABNORMAL LOW (ref >90)
Glucose, Bld: 94 mg/dL (ref 70–99)
Potassium: 4.2 mmol/L (ref 3.5–5.1)
Sodium: 137 mmol/L (ref 135–145)

## 2014-04-07 LAB — CBC
HEMATOCRIT: 31.7 % — AB (ref 36.0–46.0)
HEMOGLOBIN: 10.5 g/dL — AB (ref 12.0–15.0)
MCH: 32.3 pg (ref 26.0–34.0)
MCHC: 33.1 g/dL (ref 30.0–36.0)
MCV: 97.5 fL (ref 78.0–100.0)
Platelets: 214 10*3/uL (ref 150–400)
RBC: 3.25 MIL/uL — AB (ref 3.87–5.11)
RDW: 12.7 % (ref 11.5–15.5)
WBC: 4.5 10*3/uL (ref 4.0–10.5)

## 2014-04-07 MED ORDER — SODIUM CHLORIDE 0.9 % IV SOLN
INTRAVENOUS | Status: DC
  Administered 2014-04-07 – 2014-04-08 (×3): via INTRAVENOUS

## 2014-04-07 MED ORDER — SODIUM CHLORIDE 0.9 % IV BOLUS (SEPSIS)
500.0000 mL | Freq: Once | INTRAVENOUS | Status: AC
Start: 2014-04-07 — End: 2014-04-07
  Administered 2014-04-07: 500 mL via INTRAVENOUS

## 2014-04-07 MED ORDER — METOPROLOL TARTRATE 25 MG PO TABS
25.0000 mg | ORAL_TABLET | Freq: Two times a day (BID) | ORAL | Status: DC
  Administered 2014-04-07 – 2014-04-08 (×3): 25 mg via ORAL
  Filled 2014-04-07 (×4): qty 1

## 2014-04-07 MED ORDER — BOOST PLUS PO LIQD
237.0000 mL | Freq: Three times a day (TID) | ORAL | Status: DC
  Filled 2014-04-07 (×8): qty 237

## 2014-04-07 NOTE — Evaluation (Signed)
Physical Therapy Evaluation and Discharge Patient Details Name: [name] MRN: [id number] DOB: 1933-08-17 Today's Date: 04/07/2014   History of Present Illness  81 y.o. female with recent cerebellar stroke and now evidence of severe orthostatic hypotension, was admitted after a fall at home.  Clinical Impression  Patient evaluated by Physical Therapy with no further acute PT needs identified. Family present throughout therapy evaluation and reports she has essentially returned to her baseline level of function which is ambulating quickly with a rolling walker, needing intermittent cues for safety. Pt denied any symptoms of dizziness throughout therapy session. Family states she has been progressing well with HHPT and would like to continue with her HHPT and 24/7 supervision provided by family. All education has been completed and the patient has no further questions. See below for any follow-up Physial Therapy or equipment needs. PT is signing off. Thank you for this referral.     Follow Up Recommendations Home health PT;Supervision/Assistance - 24 hour    Equipment Recommendations  None recommended by PT    Recommendations for Other Services       Precautions / Restrictions Precautions Precautions: Fall Restrictions Weight Bearing Restrictions: No      Mobility  Bed Mobility Overal bed mobility: Modified Independent                Transfers Overall transfer level: Needs assistance Equipment used: Rolling walker (2 wheeled) Transfers: Sit to/from Stand Sit to Stand: Min assist         General transfer comment: Min assist with frequent VC for hand placement on bed prior to standing. Reaches for RW on several occasions before positioned correctly. Pt responds well to instructions from family.  Ambulation/Gait Ambulation/Gait assistance: Supervision Ambulation Distance (Feet): 165 Feet Assistive device: Rolling walker (2 wheeled) Gait Pattern/deviations:  Step-through pattern;Trunk flexed Gait velocity: WNL Gait velocity interpretation: at or above normal speed for age/gender General Gait Details: Pt initially ambulating very quickly across room. VC to slow down from PT and family members which she responded well to and maintained this safer gait speed throughout the rest of our distance covered today. No loss of balance noted. Demonstrates good control of RW. Supervision for safety. HR 97. Denied any feelings of dizziness or lightheadedness.  Stairs            Wheelchair Mobility    Modified Rankin (Stroke Patients Only)       Balance Overall balance assessment: Needs assistance;History of Falls Sitting-balance support: No upper extremity supported;Feet supported Sitting balance-Leahy Scale: Good     Standing balance support: No upper extremity supported Standing balance-Leahy Scale: Fair                               Pertinent Vitals/Pain Pain Assessment: No/denies pain    Home Living Family/patient expects to be discharged to:: Private residence Living Arrangements: Children Available Help at Discharge: Family;Available 24 hours/day Type of Home: House Home Access: Stairs to enter Entrance Stairs-Rails: None Entrance Stairs-Number of Steps: 2 steps to back porch with no rails Home Layout: Able to live on main level with bedroom/bathroom Home Equipment: Walker - 2 wheels;Tub bench;Bedside commode      Prior Function Level of Independence: Needs assistance   Gait / Transfers Assistance Needed: uses RW for mobility, supervision at all times  ADL's / Homemaking Assistance Needed: needs assist for bath/dress        Hand Dominance   Dominant Hand:  Right    Extremity/Trunk Assessment   Upper Extremity Assessment: Defer to OT evaluation           Lower Extremity Assessment: Overall WFL for tasks assessed         Communication   Communication: No difficulties  Cognition Arousal/Alertness:  Awake/alert Behavior During Therapy: WFL for tasks assessed/performed Overall Cognitive Status: History of cognitive impairments - at baseline                      General Comments General comments (skin integrity, edema, etc.): Family was present and states she is essentially at her baseline level of function which is a fast paced gait with a rolling walker. Family has to cue pt for safety at times but has been progressing well with PT at home.    Exercises        Assessment/Plan    PT Assessment Patent does not need any further PT services  PT Diagnosis Difficulty walking   PT Problem List    PT Treatment Interventions     PT Goals (Current goals can be found in the Care Plan section) Acute Rehab PT Goals Patient Stated Goal: none stated PT Goal Formulation: With family Time For Goal Achievement: 04/21/14 Potential to Achieve Goals: Good    Frequency     Barriers to discharge        Co-evaluation               End of Session Equipment Utilized During Treatment: Gait belt Activity Tolerance: Patient tolerated treatment well Patient left: in chair;with call bell/phone within reach;with family/visitor present Nurse Communication: Mobility status         Time: 1423-9532 PT Time Calculation (min) (ACUTE ONLY): 21 min   Charges:   PT Evaluation $Initial PT Evaluation Tier I: 1 Procedure     PT G CodesEllouise Newer 04/07/2014, 4:58 PM  Camille Bal Haslett, [place]

## 2014-04-07 NOTE — Progress Notes (Signed)
INITIAL NUTRITION ASSESSMENT  DOCUMENTATION CODES Per approved criteria  -Not Applicable   INTERVENTION: -D/c Ensure Enlive po BID, each supplement provides 350 kcal and 20 grams of protein due to poor acceptance -Magic Cup TID at meals  NUTRITION DIAGNOSIS: Inadequate oral intake related to decreased appetite as evidenced by 9% wt loss x 1 month.   Goal: Pt will meet >90% of estimated nutritional needs  Monitor:  PO/supplement intake, labs, weight changes, I/O's  Reason for Assessment: MST=3  81 y.o. female  Admitting Dx: Vasovagal near syncope  81 year old female patient who was admitted 01/31/2014 for acute left cerebellar infarct. Her presenting symptoms at that time included nausea, vomiting and dizziness. For workup was completed and patient was placed on Plavix. There was a question of PAF versus embolic source but a full embolic workup was not pursued as the patient was a fall risk and a chronic anticoagulation candidate.  ASSESSMENT: Pt admitted with syncope. Pt was down for a procedure at time of visit. Nutrition-focused physical exam deferred at this time.  Pt was hospitalized in 01/2014 for CVA. Per RD notes, pt does not like supplements due to sweet taste. Pt has been refusing Ensure Enlive supplement that has been ordered. RD will d/c due to poor acceptance.  Meal intake 75% per doc flowsheets. Noted 9% wt loss x 1 month which is clinically significant. UBW of 135 per wt hx.  Labs reviewed. CBGS: 125.   Height: Ht Readings from Last 1 Encounters:  04/06/14 5\' 3"  (1.6 m)    Weight: Wt Readings from Last 1 Encounters:  04/06/14 122 lb 9.6 oz (55.611 kg)    Ideal Body Weight: 115#  % Ideal Body Weight: 106%  Wt Readings from Last 10 Encounters:  04/06/14 122 lb 9.6 oz (55.611 kg)  03/31/14 126 lb 9.6 oz (57.425 kg)  02/23/14 132 lb 7.9 oz (60.1 kg)  02/08/14 123 lb 14.4 oz (56.2 kg)  01/17/14 135 lb (61.236 kg)  01/16/14 135 lb (61.236 kg)  07/31/11  134 lb (60.782 kg)    Usual Body Weight: 135#  % Usual Body Weight: 90%  BMI:  Body mass index is 21.72 kg/(m^2).  Estimated Nutritional Needs: Kcal: 1650-1850 Protein: 70-80 grams Fluid: 1.7-1.9 L  Skin: WDL  Diet Order: Diet Heart Room service appropriate?: Yes; Fluid consistency:: Thin  EDUCATION NEEDS: -Education not appropriate at this time   Intake/Output Summary (Last 24 hours) at 04/07/14 1301 Last data filed at 04/07/14 0927  Gross per 24 hour  Intake    360 ml  Output    600 ml  Net   -240 ml    Last BM: 04/06/14  Labs:   Recent Labs Lab 04/06/14 1512 04/07/14 0635  NA 135 137  K 4.1 4.2  CL 99 106  CO2 25 24  BUN 9 6  CREATININE 1.04 0.99  CALCIUM 9.0 8.5  GLUCOSE 114* 94    CBG (last 3)   Recent Labs  04/06/14 1333  GLUCAP 125*    Scheduled Meds: . aspirin  81 mg Oral Daily  . atorvastatin  80 mg Oral q morning - 10a  . clopidogrel  75 mg Oral Daily  . enoxaparin (LOVENOX) injection  40 mg Subcutaneous Q24H  . feeding supplement (ENSURE ENLIVE)  237 mL Oral BID BM  . FLUoxetine  10 mg Oral Daily  . lactose free nutrition  237 mL Oral TID WC  . metoprolol tartrate  25 mg Oral BID    Continuous Infusions: .  sodium chloride 100 mL/hr at 04/07/14 4270    Past Medical History  Diagnosis Date  . Hypertension   . Hyperlipemia   . Dementia   . Cerebral hemorrhage, nontraumatic 2011  . CVA (cerebral infarction) 2011    left cerebellum  . Ureterocele, acquired   . Broken arm     left x 3  . Brain swelling 2016  . History of pubovaginal sling   . Depression   . Chronic kidney disease     stage 3    Past Surgical History  Procedure Laterality Date  . Abdominal hysterectomy    . Cholecystectomy    . Fracture surgery Left     3 fractures in past  . Other surgical history  2006    "bladder tac"    Amiyrah Lamere A. Jimmye Norman, RD, LDN, CDE Pager: [phone] After hours Pager: [phone]

## 2014-04-07 NOTE — Progress Notes (Signed)
Arrived to floor to be admitted to room 5w16 via stretcher with family at bedside.  Alert and able to voice needs.  Assistance required to bedside and made comfortable at this time.  Unsteady on feet.  No dizziness expressed at this time.  Family has some concerns and requesting to speak to a doctor.  Made aware of use of call system and placed within reach.  Bed in low position; will continue to monitor.

## 2014-04-07 NOTE — Progress Notes (Signed)
Subjective: No further episodes similar to which brought her into the hospital.  Family concerned about mild gait instability when standing. Family noted she is orthostatic when standing and orthostatic vitals do show a drop in BP from 119/58 to 70/53 when standing. Currently being hydrated.   Objective: Current vital signs: BP 128/61 mmHg  Pulse 71  Temp(Src) 98.4 F (36.9 C) (Oral)  Resp 18  Ht 5\' 3"  (1.6 m)  Wt 55.611 kg (122 lb 9.6 oz)  BMI 21.72 kg/m2  SpO2 97% Vital signs in last 24 hours: Temp:  [98.2 F (36.8 C)-98.4 F (36.9 C)] 98.4 F (36.9 C) (03/31 0521) Pulse Rate:  [51-83] 71 (03/31 0521) Resp:  [12-20] 18 (03/31 0521) BP: (126-160)/(51-75) 128/61 mmHg (03/31 0521) SpO2:  [90 %-100 %] 97 % (03/31 0926) Weight:  [55.611 kg (122 lb 9.6 oz)-58.968 kg (130 lb)] 55.611 kg (122 lb 9.6 oz) (03/30 2124)  Intake/Output from previous day: 03/30 0701 - 03/31 0700 In: -  Out: 600 [Urine:600] Intake/Output this shift: Total I/O In: 360 [P.O.:360] Out: -  Nutritional status: Diet Heart Room service appropriate?: Yes; Fluid consistency:: Thin  Neurologic Exam: Mental Status: Alert, oriented to hospital, March and the year is 2016. Speech fluent without evidence of aphasia. Able to follow 3 step commands without difficulty. Cranial Nerves: II:  Visual fields grossly normal, pupils equal, round, reactive to light and accommodation III,IV, VI: ptosis not present, extra-ocular motions intact bilaterally V,VII: smile symmetric, facial light touch sensation normal bilaterally VIII: hearing normal bilaterally IX,X: uvula rises symmetrically XI: bilateral shoulder shrug XII: midline tongue extension Motor: Right :Upper extremity 5/5Left: Upper extremity 5/5 Lower extremity 5/5Lower extremity 5/5 --left foot drop Tone and bulk:normal tone throughout; no atrophy  noted Sensory: Pinprick and light touch intact throughout, bilaterally Deep Tendon Reflexes: 2+ and symmetric throughout UE 1+ bilateral KJ and no AJ Plantars: Right: downgoingLeft: upgoing Cerebellar: normal finger-to-nose Gait: patient stood up and felt dizzy. When walking shows short shuffled steps but no retropulsion.   Lab Results: Basic Metabolic Panel:  Recent Labs Lab 04/06/14 1512 04/07/14 0635  NA 135 137  K 4.1 4.2  CL 99 106  CO2 25 24  GLUCOSE 114* 94  BUN 9 6  CREATININE 1.04 0.99  CALCIUM 9.0 8.5    Liver Function Tests:  Recent Labs Lab 04/06/14 1512  AST 25  ALT 21  ALKPHOS 89  BILITOT 0.6  PROT 6.2  ALBUMIN 3.6   No results for input(s): LIPASE, AMYLASE in the last 168 hours. No results for input(s): AMMONIA in the last 168 hours.  CBC:  Recent Labs Lab 04/06/14 1512 04/07/14 0635  WBC 5.8 4.5  NEUTROABS 4.8  --   HGB 11.8* 10.5*  HCT 34.7* 31.7*  MCV 95.6 97.5  PLT 237 214    Cardiac Enzymes:  Recent Labs Lab 04/06/14 1512  TROPONINI <0.03    Lipid Panel: No results for input(s): CHOL, TRIG, HDL, CHOLHDL, VLDL, LDLCALC in the last 168 hours.  CBG:  Recent Labs Lab 04/06/14 1333  GLUCAP 125*    Microbiology: Results for orders placed or performed during the hospital encounter of 02/09/14  Culture, Urine     Status: None   Collection Time: 02/11/14  9:26 PM  Result Value Ref Range Status   Specimen Description URINE, CLEAN CATCH  Final   Special Requests NONE  Final   Colony Count   Final    >=100,000 COLONIES/ML Performed at [facility]  Culture   Final    ESCHERICHIA COLI Performed at [facility]    Report Status 02/14/2014 FINAL  Final   Organism ID, Bacteria ESCHERICHIA COLI  Final      Susceptibility   Escherichia coli - MIC*    AMPICILLIN <=2 SENSITIVE Sensitive     CEFAZOLIN <=4 SENSITIVE Sensitive     CEFTRIAXONE <=1 SENSITIVE Sensitive      CIPROFLOXACIN <=0.25 SENSITIVE Sensitive     GENTAMICIN <=1 SENSITIVE Sensitive     LEVOFLOXACIN <=0.12 SENSITIVE Sensitive     NITROFURANTOIN <=16 SENSITIVE Sensitive     TOBRAMYCIN <=1 SENSITIVE Sensitive     TRIMETH/SULFA <=20 SENSITIVE Sensitive     PIP/TAZO <=4 SENSITIVE Sensitive     * ESCHERICHIA COLI    Coagulation Studies: No results for input(s): LABPROT, INR in the last 72 hours.  Imaging: Dg Chest 2 View  04/06/2014   CLINICAL DATA:  One-day history of bilateral lower extremity weakness ; history of previous CVA, hypertension, previous tobacco use.  EXAM: CHEST  2 VIEW  COMPARISON:  Portable chest x-ray of February 05, 2014  FINDINGS: The lungs are adequately inflated. There is subtle increased density in the right infrahilar region which may reflect atelectasis or early infiltrate. It is not clearly evident on the lateral film. The cardiac silhouette is top-normal in size but stable. The pulmonary vascularity is normal. The mediastinum is normal in width. There is no pleural effusion. The bony thorax exhibits no acute abnormality. There is old partial compression of the body of T7.  IMPRESSION: Subtle increased density in the right infrahilar region suggests subsegmental atelectasis or scarring. No alveolar infiltrate is evident. There is underlying COPD. There is no evidence of CHF.   Electronically Signed   By: David  Martinique   On: 04/06/2014 14:52   Ct Head Wo Contrast  04/06/2014   CLINICAL DATA:  Weakness for the past hr.  Stroke last month.  EXAM: CT HEAD WITHOUT CONTRAST  TECHNIQUE: Contiguous axial images were obtained from the base of the skull through the vertex without intravenous contrast.  COMPARISON:  02/07/2014  FINDINGS: Encephalomalacia from left PICA distribution stroke noted, with expected evolutionary changes compared to 02/07/14. No visible hemorrhage in this vicinity.  Stable linear calcifications in the globus pallidus nuclei, probably physiologic.  Periventricular  white matter and corona radiata hypodensities favor chronic ischemic microvascular white matter disease.  Chronic sphenoid and posterior ethmoid sinusitis.  No intracranial hemorrhage, mass lesion, or acute CVA.  IMPRESSION: 1. Encephalomalacia from prior PICA infarct. No acute findings identified. 2. Chronic sphenoid and ethmoid sinusitis. 3. Periventricular white matter and corona radiata hypodensities favor chronic ischemic microvascular white matter disease.   Electronically Signed   By: Van Clines M.D.   On: 04/06/2014 14:40   Mr Brain Wo Contrast  04/07/2014   CLINICAL DATA:  Dizziness in bathroom, diaphoresis and confusion. Hypertensive. Similar symptoms associated with recent stroke January 2016.  EXAM: MRI HEAD WITHOUT CONTRAST  TECHNIQUE: Multiplanar, multiecho pulse sequences of the brain and surrounding structures were obtained without intravenous contrast.  COMPARISON:  CT of the head February 05, 2014 at 1434 hours and MRI of the brain February 01, 2014  FINDINGS: No reduced diffusion to suggest acute ischemia. Again seen is susceptibility artifact and LEFT cerebellum correspond to petechial hemorrhage/mineralization associated with prior infarct/ encephalomalacia with gliosis. Tiny remote additional small cerebellar infarcts. Mild LEFT greater than RIGHT supratentorial white matter T2 hyperintensities without midline, mass effect or mass lesions.  Additional scattered subcentimeter supra and infratentorial foci of susceptibility within the periphery of the brain.  No abnormal extra-axial fluid collections. Normal major intracranial vascular flow voids seen at the skull base. Ocular globes and orbital contents are unremarkable. Mild paranasal sinus mucosal thickening with layering sphenoid sinus air-fluid level. No abnormal sellar expansion. No cerebellar tonsillar ectopia. No suspicious calvarial bone marrow signal. Patient is edentulous.  IMPRESSION: No acute intracranial process,  specifically no acute ischemia.  Subacute to chronic LEFT posterior inferior cerebellar artery territory infarct with sequelae of petechial hemorrhage/ mineralization.  Similar scattered susceptibility artifact suggesting sequelae of chronic hypertension. White matter changes suggest mild chronic small vessel ischemic disease.  Worsening mild to moderate sphenoid sinusitis.   Electronically Signed   By: Elon Alas   On: 04/07/2014 01:50    Medications:  Scheduled: . aspirin  81 mg Oral Daily  . atorvastatin  80 mg Oral q morning - 10a  . clopidogrel  75 mg Oral Daily  . enoxaparin (LOVENOX) injection  40 mg Subcutaneous Q24H  . feeding supplement (ENSURE ENLIVE)  237 mL Oral BID BM  . FLUoxetine  10 mg Oral Daily  . irbesartan  300 mg Oral Daily   Etta Quill PA-C [facility] [phone]  04/07/2014, 9:46 AM  Patient seen and examined.  Clinical course and management discussed.  Necessary edits performed.  I agree with the above.  Assessment and plan of care developed and discussed below.   Assessment/Plan: 81 YO female presenting to ED with episode of dizziness similar to previous stroke.  No further symptoms while in hospital. Currently being hydrated as she was found to be significantly orthostatic when standing. On ASA and Plavix for stoke prevention. PT evaluation pending.  MRI of the brain personally reviewed and shows no acute changes.    Recommendations: 1.  Continue ASA and Plavix 2.  Patient to follow up with neurology as an outpatient     Alexis Goodell, MD [facility] [phone]  04/07/2014  11:51 AM

## 2014-04-07 NOTE — Progress Notes (Signed)
Utilization review completed. Nikkol Pai, RN, BSN. 

## 2014-04-07 NOTE — Progress Notes (Signed)
Patient Demographics  [name], is a 81 y.o. female, DOB - June 19, 1933, EQA:[id number]  Admit date - 04/06/2014   Admitting Physician Verlee Monte, MD  Outpatient Primary MD for the patient is [name], NP  LOS - 1   Chief Complaint  Patient presents with  . Weakness        Subjective:   [name] today has, No headache, No chest pain, No abdominal pain - No Nausea, No new weakness tingling or numbness, No Cough - SOB.    Assessment & Plan    1. Fall/loss of balance - patient with recent cerebellar stroke and now evidence of severe orthostatic hypotension. MRI of the brain repeated with no acute change. She was on high dose of ARB which will be stopped, she will be hydrated, TED stockings applied, she is feeling better. Increase activity. PT evaluation. Neuro on board.   2. Recent cerebellar CVA. On aspirin, Plavix and statin which will be continued for secondary prevention.   3. Essential hypertension. Stopped Avapro and replaced with low-dose beta blocker due to severe orthostasis.    4. Moderate protein calorie malnutrition. Will place on protein supplementation.    5. Depression-  on Prozac. If orthostasis continues. We will stop Prozac.     Code Status: Full  Family Communication: daughter and son  Disposition Plan: TBD   Procedures CT and MRI brain   Consults  Neuro   Medications  Scheduled Meds: . aspirin  81 mg Oral Daily  . atorvastatin  80 mg Oral q morning - 10a  . clopidogrel  75 mg Oral Daily  . enoxaparin (LOVENOX) injection  40 mg Subcutaneous Q24H  . feeding supplement (ENSURE ENLIVE)  237 mL Oral BID BM  . FLUoxetine  10 mg Oral Daily  . metoprolol tartrate  25 mg Oral BID   Continuous Infusions: . sodium chloride     PRN  Meds:.meclizine  DVT Prophylaxis  Lovenox   Lab Results  Component Value Date   PLT 214 04/07/2014    Antibiotics     Anti-infectives    None          Objective:   Filed Vitals:   04/06/14 2126 04/06/14 2128 04/07/14 0521 04/07/14 0926  BP: 158/63 135/72 128/61   Pulse: 68  71   Temp:   98.4 F (36.9 C)   TempSrc:   Oral   Resp:   18   Height:      Weight:      SpO2: 97%  97% 97%    Wt Readings from Last 3 Encounters:  04/06/14 55.611 kg (122 lb 9.6 oz)  03/31/14 57.425 kg (126 lb 9.6 oz)  02/23/14 60.1 kg (132 lb 7.9 oz)     Intake/Output Summary (Last 24 hours) at 04/07/14 1017 Last data filed at 04/07/14 0927  Gross per 24 hour  Intake    360 ml  Output    600 ml  Net   -240 ml     Physical Exam  Awake Alert, Oriented X 3, No new F.N deficits, Normal affect [place].AT,PERRAL Supple Neck,No JVD, No cervical lymphadenopathy appriciated.  Symmetrical Chest wall movement, Good air movement bilaterally, CTAB RRR,No Gallops,Rubs or new Murmurs, No Parasternal Heave +ve B.Sounds,  Abd Soft, No tenderness, No organomegaly appriciated, No rebound - guarding or rigidity. No Cyanosis, Clubbing or edema, No new Rash or bruise      Data Review   Micro Results No results found for this or any previous visit (from the past 240 hour(s)).  Radiology Reports Dg Chest 2 View  04/06/2014   CLINICAL DATA:  One-day history of bilateral lower extremity weakness ; history of previous CVA, hypertension, previous tobacco use.  EXAM: CHEST  2 VIEW  COMPARISON:  Portable chest x-ray of February 05, 2014  FINDINGS: The lungs are adequately inflated. There is subtle increased density in the right infrahilar region which may reflect atelectasis or early infiltrate. It is not clearly evident on the lateral film. The cardiac silhouette is top-normal in size but stable. The pulmonary vascularity is normal. The mediastinum is normal in width. There is no pleural effusion. The bony thorax  exhibits no acute abnormality. There is old partial compression of the body of T7.  IMPRESSION: Subtle increased density in the right infrahilar region suggests subsegmental atelectasis or scarring. No alveolar infiltrate is evident. There is underlying COPD. There is no evidence of CHF.   Electronically Signed   By: David  Martinique   On: 04/06/2014 14:52   Ct Head Wo Contrast  04/06/2014   CLINICAL DATA:  Weakness for the past hr.  Stroke last month.  EXAM: CT HEAD WITHOUT CONTRAST  TECHNIQUE: Contiguous axial images were obtained from the base of the skull through the vertex without intravenous contrast.  COMPARISON:  02/07/2014  FINDINGS: Encephalomalacia from left PICA distribution stroke noted, with expected evolutionary changes compared to 02/07/14. No visible hemorrhage in this vicinity.  Stable linear calcifications in the globus pallidus nuclei, probably physiologic.  Periventricular white matter and corona radiata hypodensities favor chronic ischemic microvascular white matter disease.  Chronic sphenoid and posterior ethmoid sinusitis.  No intracranial hemorrhage, mass lesion, or acute CVA.  IMPRESSION: 1. Encephalomalacia from prior PICA infarct. No acute findings identified. 2. Chronic sphenoid and ethmoid sinusitis. 3. Periventricular white matter and corona radiata hypodensities favor chronic ischemic microvascular white matter disease.   Electronically Signed   By: Van Clines M.D.   On: 04/06/2014 14:40   Mr Brain Wo Contrast  04/07/2014   CLINICAL DATA:  Dizziness in bathroom, diaphoresis and confusion. Hypertensive. Similar symptoms associated with recent stroke January 2016.  EXAM: MRI HEAD WITHOUT CONTRAST  TECHNIQUE: Multiplanar, multiecho pulse sequences of the brain and surrounding structures were obtained without intravenous contrast.  COMPARISON:  CT of the head February 05, 2014 at 1434 hours and MRI of the brain February 01, 2014  FINDINGS: No reduced diffusion to suggest acute  ischemia. Again seen is susceptibility artifact and LEFT cerebellum correspond to petechial hemorrhage/mineralization associated with prior infarct/ encephalomalacia with gliosis. Tiny remote additional small cerebellar infarcts. Mild LEFT greater than RIGHT supratentorial white matter T2 hyperintensities without midline, mass effect or mass lesions. Additional scattered subcentimeter supra and infratentorial foci of susceptibility within the periphery of the brain.  No abnormal extra-axial fluid collections. Normal major intracranial vascular flow voids seen at the skull base. Ocular globes and orbital contents are unremarkable. Mild paranasal sinus mucosal thickening with layering sphenoid sinus air-fluid level. No abnormal sellar expansion. No cerebellar tonsillar ectopia. No suspicious calvarial bone marrow signal. Patient is edentulous.  IMPRESSION: No acute intracranial process, specifically no acute ischemia.  Subacute to chronic LEFT posterior inferior cerebellar artery territory infarct with sequelae of petechial hemorrhage/ mineralization.  Similar scattered  susceptibility artifact suggesting sequelae of chronic hypertension. White matter changes suggest mild chronic small vessel ischemic disease.  Worsening mild to moderate sphenoid sinusitis.   Electronically Signed   By: Elon Alas   On: 04/07/2014 01:50     CBC  Recent Labs Lab 04/06/14 1512 04/07/14 0635  WBC 5.8 4.5  HGB 11.8* 10.5*  HCT 34.7* 31.7*  PLT 237 214  MCV 95.6 97.5  MCH 32.5 32.3  MCHC 34.0 33.1  RDW 12.6 12.7  LYMPHSABS 0.6*  --   MONOABS 0.4  --   EOSABS 0.1  --   BASOSABS 0.0  --     Chemistries   Recent Labs Lab 04/06/14 1512 04/07/14 0635  NA 135 137  K 4.1 4.2  CL 99 106  CO2 25 24  GLUCOSE 114* 94  BUN 9 6  CREATININE 1.04 0.99  CALCIUM 9.0 8.5  AST 25  --   ALT 21  --   ALKPHOS 89  --   BILITOT 0.6  --     ------------------------------------------------------------------------------------------------------------------ estimated creatinine clearance is 38.1 mL/min (by C-G formula based on Cr of 0.99). ------------------------------------------------------------------------------------------------------------------ No results for input(s): HGBA1C in the last 72 hours. ------------------------------------------------------------------------------------------------------------------ No results for input(s): CHOL, HDL, LDLCALC, TRIG, CHOLHDL, LDLDIRECT in the last 72 hours. ------------------------------------------------------------------------------------------------------------------ No results for input(s): TSH, T4TOTAL, T3FREE, THYROIDAB in the last 72 hours.  Invalid input(s): FREET3 ------------------------------------------------------------------------------------------------------------------ No results for input(s): VITAMINB12, FOLATE, FERRITIN, TIBC, IRON, RETICCTPCT in the last 72 hours.  Coagulation profile No results for input(s): INR, PROTIME in the last 168 hours.  No results for input(s): DDIMER in the last 72 hours.  Cardiac Enzymes  Recent Labs Lab 04/06/14 1512  TROPONINI <0.03   ------------------------------------------------------------------------------------------------------------------ Invalid input(s): POCBNP     Time Spent in minutes   35   Reason Helzer K M.D on 04/07/2014 at 10:17 AM  Between 7am to 7pm - Pager - [phone]  After 7pm go to [email or web address] - password [facility]  [facility]   Office  [phone]

## 2014-04-07 NOTE — Progress Notes (Signed)
EEG Completed; Results Pending  

## 2014-04-08 ENCOUNTER — Telehealth: Payer: Self-pay | Admitting: *Deleted

## 2014-04-08 MED ORDER — SODIUM CHLORIDE 0.9 % IV SOLN: INTRAVENOUS | Status: DC

## 2014-04-08 MED ORDER — BOOST PLUS PO LIQD: 237.0000 mL | Freq: Three times a day (TID) | ORAL | Status: DC

## 2014-04-08 MED ORDER — ONDANSETRON HCL 4 MG/2ML IJ SOLN
4.0000 mg | Freq: Four times a day (QID) | INTRAMUSCULAR | Status: DC | PRN
  Filled 2014-04-08: qty 2

## 2014-04-08 MED ORDER — METOPROLOL TARTRATE 25 MG PO TABS
25.0000 mg | ORAL_TABLET | Freq: Two times a day (BID) | ORAL | Status: DC
Start: 2014-04-08 — End: 2014-05-07

## 2014-04-08 NOTE — Progress Notes (Signed)
  Pharmacy Discharge Medication Therapy Review   Total Number of meds on admission: 9 (polypharmacy > 10 meds)  Indications for all medications: [x]  Yes       []  No  Adherence Review  [x]  Excellent (no doses missed/week)     []  Good (no more than 1 dose missed/week)     []  Partial (2-3 doses missed/week)     []  Poor (>3 doses missed/week)  Total number of high risk medications: 1 (Anticoagulants, Dual antiplatelets, oral Antihyperglycemic agents,Insulins, Antipsychotics, Anti-Seizure meds, Inhalers, HF/ACS meds, Antibiotics and HIV medications)   Assessment: (Medication related problems)  Intervention  YES NO  Explanation   Indications      Medication without noted indication []  [x]     Indication without noted medication []  [x]     Duplicate therapy []  [x]    Efficacy      Suboptimal drug or dose selection []  [x]    Insufficient dose/duration []  [x]    Failure to receive therapy  (Rx not filled) []  [x]     Safety      Adverse drug event [x]  []   Pt had orthostatic hypotension on PTA irbesartan. Switched to [employer].   Drug interaction [x]  []   Prilosec and Prozac may decrease the therapeutic effect of Plavix   Excessive dose/duration []  [x]    High-risk medications [x]  []   Plavix  Compliance     Underuse []  [x]     Overuse []  [x]    Other pertinent pharmacist counseling []  [x]      Total number of new medications upon discharge: 1  Time:  Time spent preparing for discharge counseling: 30 Time spent counseling patient/caregiver: 10   PLAN:  Consider the following at discharge/Recommendations discussed with provider - Change Prilosec to Protonix due to drug interaction with Plavix - Change Prozac to Celexa or Lexapro due to drug interaction with Plavix - Called pt's caretaker [name], daughter) to provide recommendations - to be addressed by PCP, f/u appointment already scheduled  Drucie Opitz, PharmD Clinical Pharmacy Resident Pager: [phone]

## 2014-04-08 NOTE — Discharge Summary (Signed)
[name], is a 81 y.o. female  DOB January 18, 1933  MRN [id number].  Admission date:  04/06/2014  Admitting Physician  Verlee Monte, MD  Discharge Date:  04/08/2014   Primary MD  [name], NP  Recommendations for primary care physician for things to follow:   Monitor orthostatics    Admission Diagnosis  Vasovagal near syncope [phone]   Discharge Diagnosis  Vasovagal near syncope [phone]    Principal Problem:   Vasovagal near syncope Active Problems:   Dementia   History of CVA (cerebrovascular accident)   Essential hypertension   Malnutrition of moderate degree   Obstructive hydrocephalus   Weakness      Past Medical History  Diagnosis Date  . Hypertension   . Hyperlipemia   . Dementia   . Cerebral hemorrhage, nontraumatic 2011  . CVA (cerebral infarction) 2011    left cerebellum  . Ureterocele, acquired   . Broken arm     left x 3  . Brain swelling 2016  . History of pubovaginal sling   . Depression   . Chronic kidney disease     stage 3    Past Surgical History  Procedure Laterality Date  . Abdominal hysterectomy    . Cholecystectomy    . Fracture surgery Left     3 fractures in past  . Other surgical history  2006    "bladder tac"       History of present illness and  Hospital Course:     Kindly see H&P for history of present illness and admission details, please review complete Labs, Consult reports and Test reports for all details in brief  HPI  from the history and physical done on the day of admission  81 year old female patient who was admitted 01/31/2014 for acute left cerebellar infarct. Her presenting symptoms at that time included nausea, vomiting and dizziness. For workup was completed and patient was placed on Plavix. There was a question of PAF versus  embolic source but a full embolic workup was not pursued as the patient was a fall risk and a chronic anticoagulation candidate. Patient has been in her usual state of health except for family noticing that blood pressures typically 170 to 801 range systolically with lower diastolic readings with apparently the systolic blood pressure dropping somewhat and the patient is up and ambulating. Today while the patient was utilizing the restroom the patient became diaphoretic, confused and dizzy without vertigo symptoms. By the time the patient presented to the ER the symptoms had resolved. She presented to the ER because the symptoms were similar to her recent stroke admission symptoms.  In the ER her blood pressure was 154/53. Chest x-ray was unremarkable except for a possible increased density in the right infrahilar region consistent likely with scarring. No obvious infiltrate. EKG revealed sinus rhythm with normal QTC. In further questioning patient does not recall any issues with shortness of breath, tachycardia palpitations or chest pain with these symptoms. Further history reveals patient's Prilosec and been discontinued by her primary  care physician and due to interaction with Plavix. Patient eats small amounts of food at home but seems to drink adequate amount of fluids. She does report some loose stools today. Since her previous admission she had begun wearing TED hose regularly.  She was evaluated by the neurology service who felt that additional workup was not indicated (please see neurology physician note). Neurology did recommend adding a baby aspirin to Plavix    Hospital Course    1. Fall/loss of balance - patient with recent cerebellar stroke and now evidence of severe orthostatic hypotension. MRI of the brain repeated with no acute change. She was on high dose of ARB which will be stopped, she will be hydrated, TED stockings applied, she is feeling better. Seen by Neuro, now ambulating and  symptom free Orthostatic drop from 60 points with symptoms is down to 30 points without symptoms (this her baseline drop per family). EEG Stable.   2. Recent cerebellar CVA. On aspirin, Plavix and statin which will be continued for secondary prevention.    3. Essential hypertension. Stopped Avapro and replaced with low-dose beta blocker due to severe orthostasis.    4. Moderate protein calorie malnutrition. Will place on protein supplementation.    5. Depression- on Prozac.      Discharge Condition: Stable   Follow UP  Follow-up Information    Follow up with [name], NP. Schedule an appointment as soon as possible for a visit in 1 week.   Specialty:  Internal Medicine   Contact information:   [address] [place] [id number] [phone]       Follow up with Xu,Jindong, MD. Schedule an appointment as soon as possible for a visit in 1 week.   Specialty:  Neurology   Contact information:   [address] [place] [address] [phone]         Discharge Instructions  and  Discharge Medications      Discharge Instructions    Diet - low sodium heart healthy    Complete by:  As directed      Discharge instructions    Complete by:  As directed   Follow with Primary MD [name], NP in 7 days   Get CBC, CMP, 2 view Chest X ray checked  by Primary MD next visit.    Activity: As tolerated with Full fall precautions use walker/cane & assistance as needed.  You must sit at a stationary position for 5 minutes and do leg extension exercises as taught for 5 minutes before you start walking from a resting position or after getting up from a bed, once you stand up , stand at that spot for 3-5 minutes while holding on to a wall-bed-heavy furniture and then walk only if you are not dizzy, using a  walker at all times, if you still get dizzy sit down, and call for help.    Disposition Home     Diet: Heart  Healthy .  For Heart failure patients - Check your Weight same time everyday, if you gain over 2 pounds, or you develop in leg swelling, experience more shortness of breath or chest pain, call your Primary MD immediately. Follow Cardiac Low Salt Diet and 1.5 lit/day fluid restriction.   On your next visit with your primary care physician please Get Medicines reviewed and adjusted.   Please request your Prim.MD to go over all Hospital Tests and Procedure/Radiological results at the follow up, please get all  Hospital records sent to your Prim MD by signing hospital release before you go home.   If you experience worsening of your admission symptoms, develop shortness of breath, life threatening emergency, suicidal or homicidal thoughts you must seek medical attention immediately by calling 911 or calling your MD immediately  if symptoms less severe.  You Must read complete instructions/literature along with all the possible adverse reactions/side effects for all the Medicines you take and that have been prescribed to you. Take any new Medicines after you have completely understood and accpet all the possible adverse reactions/side effects.   Do not drive, operating heavy machinery, perform activities at heights, swimming or participation in water activities or provide baby sitting services if your were admitted for syncope or siezures until you have seen by Primary MD or a Neurologist and advised to do so again.  Do not drive when taking Pain medications.    Do not take more than prescribed Pain, Sleep and Anxiety Medications  Special Instructions: If you have smoked or chewed Tobacco  in the last 2 yrs please stop smoking, stop any regular Alcohol  and or any Recreational drug use.  Wear Seat belts while driving.   Please note  You were cared for by a hospitalist during your hospital stay. If you have any questions about your discharge medications or the care you received while you were  in the hospital after you are discharged, you can call the unit and asked to speak with the hospitalist on call if the hospitalist that took care of you is not available. Once you are discharged, your primary care physician will handle any further medical issues. Please note that NO REFILLS for any discharge medications will be authorized once you are discharged, as it is imperative that you return to your primary care physician (or establish a relationship with a primary care physician if you do not have one) for your aftercare needs so that they can reassess your need for medications and monitor your lab values.     Increase activity slowly    Complete by:  As directed             Medication List    STOP taking these medications        irbesartan 300 MG tablet  Commonly known as:  AVAPRO     oxyCODONE-acetaminophen 5-325 MG per tablet  Commonly known as:  PERCOCET/ROXICET      TAKE these medications        alendronate 70 MG tablet  Commonly known as:  FOSAMAX  Take 1 tablet (70 mg total) by mouth once a week.     atorvastatin 80 MG tablet  Commonly known as:  LIPITOR  Take 1 tablet (80 mg total) by mouth every morning.     cholecalciferol 1000 UNITS tablet  Commonly known as:  VITAMIN D  Take 1,000 Units by mouth 2 (two) times daily.     clopidogrel 75 MG tablet  Commonly known as:  PLAVIX  Take 1 tablet (75 mg total) by mouth daily.     FLUoxetine 10 MG capsule  Commonly known as:  PROZAC  Take 1 capsule (10 mg total) by mouth daily.     lactose free nutrition Liqd  Take 237 mLs by mouth 3 (three) times daily with meals.     meclizine 25 MG tablet  Commonly known as:  ANTIVERT  Take 1 tablet (25 mg total) by mouth 3 (three) times daily as needed for dizziness.  metoprolol tartrate 25 MG tablet  Commonly known as:  LOPRESSOR  Take 1 tablet (25 mg total) by mouth 2 (two) times daily.     multivitamin with minerals Tabs tablet  Take 1 tablet by mouth daily.       omeprazole 40 MG capsule  Commonly known as:  PRILOSEC  Take 1 capsule (40 mg total) by mouth every morning.     vitamin B-12 1000 MCG tablet  Commonly known as:  CYANOCOBALAMIN  Take 1,000 mcg by mouth daily.          Diet and Activity recommendation: See Discharge Instructions above   Consults obtained - Neuro   Major procedures and Radiology Reports - PLEASE review detailed and final reports for all details, in brief -   EEG - non acute called by Dr Doy Mince.     Dg Chest 2 View  04/06/2014   CLINICAL DATA:  One-day history of bilateral lower extremity weakness ; history of previous CVA, hypertension, previous tobacco use.  EXAM: CHEST  2 VIEW  COMPARISON:  Portable chest x-ray of February 05, 2014  FINDINGS: The lungs are adequately inflated. There is subtle increased density in the right infrahilar region which may reflect atelectasis or early infiltrate. It is not clearly evident on the lateral film. The cardiac silhouette is top-normal in size but stable. The pulmonary vascularity is normal. The mediastinum is normal in width. There is no pleural effusion. The bony thorax exhibits no acute abnormality. There is old partial compression of the body of T7.  IMPRESSION: Subtle increased density in the right infrahilar region suggests subsegmental atelectasis or scarring. No alveolar infiltrate is evident. There is underlying COPD. There is no evidence of CHF.   Electronically Signed   By: David  Martinique   On: 04/06/2014 14:52   Ct Head Wo Contrast  04/06/2014   CLINICAL DATA:  Weakness for the past hr.  Stroke last month.  EXAM: CT HEAD WITHOUT CONTRAST  TECHNIQUE: Contiguous axial images were obtained from the base of the skull through the vertex without intravenous contrast.  COMPARISON:  02/07/2014  FINDINGS: Encephalomalacia from left PICA distribution stroke noted, with expected evolutionary changes compared to 02/07/14. No visible hemorrhage in this vicinity.  Stable linear  calcifications in the globus pallidus nuclei, probably physiologic.  Periventricular white matter and corona radiata hypodensities favor chronic ischemic microvascular white matter disease.  Chronic sphenoid and posterior ethmoid sinusitis.  No intracranial hemorrhage, mass lesion, or acute CVA.  IMPRESSION: 1. Encephalomalacia from prior PICA infarct. No acute findings identified. 2. Chronic sphenoid and ethmoid sinusitis. 3. Periventricular white matter and corona radiata hypodensities favor chronic ischemic microvascular white matter disease.   Electronically Signed   By: Van Clines M.D.   On: 04/06/2014 14:40   Mr Brain Wo Contrast  04/07/2014   CLINICAL DATA:  Dizziness in bathroom, diaphoresis and confusion. Hypertensive. Similar symptoms associated with recent stroke January 2016.  EXAM: MRI HEAD WITHOUT CONTRAST  TECHNIQUE: Multiplanar, multiecho pulse sequences of the brain and surrounding structures were obtained without intravenous contrast.  COMPARISON:  CT of the head February 05, 2014 at 1434 hours and MRI of the brain February 01, 2014  FINDINGS: No reduced diffusion to suggest acute ischemia. Again seen is susceptibility artifact and LEFT cerebellum correspond to petechial hemorrhage/mineralization associated with prior infarct/ encephalomalacia with gliosis. Tiny remote additional small cerebellar infarcts. Mild LEFT greater than RIGHT supratentorial white matter T2 hyperintensities without midline, mass effect or mass lesions. Additional scattered subcentimeter  supra and infratentorial foci of susceptibility within the periphery of the brain.  No abnormal extra-axial fluid collections. Normal major intracranial vascular flow voids seen at the skull base. Ocular globes and orbital contents are unremarkable. Mild paranasal sinus mucosal thickening with layering sphenoid sinus air-fluid level. No abnormal sellar expansion. No cerebellar tonsillar ectopia. No suspicious calvarial bone marrow  signal. Patient is edentulous.  IMPRESSION: No acute intracranial process, specifically no acute ischemia.  Subacute to chronic LEFT posterior inferior cerebellar artery territory infarct with sequelae of petechial hemorrhage/ mineralization.  Similar scattered susceptibility artifact suggesting sequelae of chronic hypertension. White matter changes suggest mild chronic small vessel ischemic disease.  Worsening mild to moderate sphenoid sinusitis.   Electronically Signed   By: Elon Alas   On: 04/07/2014 01:50    Micro Results      No results found for this or any previous visit (from the past 240 hour(s)).     Today   Subjective:   [name] today has no headache,no chest abdominal pain,no new weakness tingling or numbness, feels much better wants to go home today.   Objective:   Blood pressure 142/66, pulse 70, temperature 97.6 F (36.4 C), temperature source Oral, resp. rate 18, height 5\' 3"  (1.6 m), weight 55.611 kg (122 lb 9.6 oz), SpO2 97 %.   Intake/Output Summary (Last 24 hours) at 04/08/14 1017 Last data filed at 04/08/14 0520  Gross per 24 hour  Intake    240 ml  Output    550 ml  Net   -310 ml    Exam Awake Alert, Oriented x 3, No new F.N deficits, Normal affect [place].AT,PERRAL Supple Neck,No JVD, No cervical lymphadenopathy appriciated.  Symmetrical Chest wall movement, Good air movement bilaterally, CTAB RRR,No Gallops,Rubs or new Murmurs, No Parasternal Heave +ve B.Sounds, Abd Soft, Non tender, No organomegaly appriciated, No rebound -guarding or rigidity. No Cyanosis, Clubbing or edema, No new Rash or bruise  Data Review   CBC w Diff: Lab Results  Component Value Date   WBC 4.5 04/07/2014   HGB 10.5* 04/07/2014   HCT 31.7* 04/07/2014   PLT 214 04/07/2014   LYMPHOPCT 10* 04/06/2014   MONOPCT 6 04/06/2014   EOSPCT 1 04/06/2014   BASOPCT 0 04/06/2014    CMP: Lab Results  Component Value Date   NA 137 04/07/2014   K 4.2 04/07/2014   CL  106 04/07/2014   CO2 24 04/07/2014   BUN 6 04/07/2014   CREATININE 0.99 04/07/2014   PROT 6.2 04/06/2014   ALBUMIN 3.6 04/06/2014   BILITOT 0.6 04/06/2014   ALKPHOS 89 04/06/2014   AST 25 04/06/2014   ALT 21 04/06/2014  .   Total Time in preparing paper work, data evaluation and todays exam - 35 minutes  Thurnell Lose M.D on 04/08/2014 at 10:17 AM  [facility]   Office  [phone]

## 2014-04-08 NOTE — Progress Notes (Signed)
CARE MANAGEMENT NOTE 04/08/2014  Patient:  AZJAH, PARDO   Account Number:  [id number]  Date Initiated:  04/08/2014  Documentation initiated by:  [facility]  Subjective/Objective Assessment:   admitted with syncope     Action/Plan:   active with [facility] for [facility] and HHPT   Anticipated DC Date:  04/08/2014   Anticipated DC Plan:  [place]  CM consult      [facility] Choice  Resumption Of Svcs/PTA Provider   Choice offered to / List presented to:  C-4 Adult Children        HH arranged  HH-1 RN  [place].   Status of service:  Completed, signed off Medicare Important Message given?   (If response is "NO", the following Medicare IM given date fields will be blank) Date Medicare IM given:   Medicare IM given by:   Date Additional Medicare IM given:   Additional Medicare IM given by:    Discharge Disposition:  [place]  Per UR Regulation:  Reviewed for med. necessity/level of care/duration of stay   Comments:  04/08/14 Spoke with patient who asked that I contact her son in law Humphrey Rolls [phone]. Contacted Mr Noralee Chars, he stated that the patient is active with [facility] and they wish to continue with [facility]. The patient lives with Mr Noralee Chars and his wife. The patient has a rolling walker, tub bench and bedside commode at home. No equipment needs identified by therapy. Contacted [facility] and requested HHRN, HHPT and aide visits be resumed.

## 2014-04-08 NOTE — Telephone Encounter (Signed)
Transition Care Management Follow-up Telephone Call  Call completed with daughter   How have you been since you were released from the hospital? "still having some blood pressure swings, but it is around a 20-point swing instead of 40"   Do you understand why you were in the hospital? YES  Do you understand the discharge instrcutions? YES  Items Reviewed:  Medications reviewed: YES   Allergies reviewed: YES  Dietary changes reviewed: YES- patient was told to limit sodium and drink more water  Referrals reviewed: YES   Functional Questionnaire:   Activities of Daily Living (ADLs):   She states they are independent in the following: Patient uses [facility], hospital has re-ordered, OT has signed off, family has to be there to assist Family would like Speech Therapy again States they require assistance with the following: restroom,    Any transportation issues/concerns?: NO- family will bring    Any patient concerns? NO   Confirmed importance and date/time of follow-up visits scheduled: YES- patient scheduled with Elyn Aquas, PA on 04/15/14.     Confirmed with patient if condition begins to worsen call PCP or go to the ER.  Patient was given the Call-a-Nurse line [phone]: YES

## 2014-04-08 NOTE — Discharge Instructions (Signed)
Follow with Primary MD [name], NP in 7 days   Get CBC, CMP, 2 view Chest X ray checked  by Primary MD next visit.    Activity: As tolerated with Full fall precautions use walker/cane & assistance as needed  You must sit at a stationary position for 5 minutes and do leg extension exercises as taught for 5 minutes before you start walking from a resting position or after getting up from a bed, once you stand up , stand at that spot for 3-5 minutes while holding on to a wall-bed-heavy furniture and then walk only if you are not dizzy, using a  walker at all times, if you still get dizzy sit down, and call for help.   Disposition Home     Diet: Heart Healthy .  For Heart failure patients - Check your Weight same time everyday, if you gain over 2 pounds, or you develop in leg swelling, experience more shortness of breath or chest pain, call your Primary MD immediately. Follow Cardiac Low Salt Diet and 1.5 lit/day fluid restriction.   On your next visit with your primary care physician please Get Medicines reviewed and adjusted.   Please request your Prim.MD to go over all Hospital Tests and Procedure/Radiological results at the follow up, please get all Hospital records sent to your Prim MD by signing hospital release before you go home.   If you experience worsening of your admission symptoms, develop shortness of breath, life threatening emergency, suicidal or homicidal thoughts you must seek medical attention immediately by calling 911 or calling your MD immediately  if symptoms less severe.  You Must read complete instructions/literature along with all the possible adverse reactions/side effects for all the Medicines you take and that have been prescribed to you. Take any new Medicines after you have completely understood and accpet all the possible adverse reactions/side effects.   Do not drive, operating heavy machinery, perform activities at heights, swimming or  participation in water activities or provide baby sitting services if your were admitted for syncope or siezures until you have seen by Primary MD or a Neurologist and advised to do so again.  Do not drive when taking Pain medications.    Do not take more than prescribed Pain, Sleep and Anxiety Medications  Special Instructions: If you have smoked or chewed Tobacco  in the last 2 yrs please stop smoking, stop any regular Alcohol  and or any Recreational drug use.  Wear Seat belts while driving.   Please note  You were cared for by a hospitalist during your hospital stay. If you have any questions about your discharge medications or the care you received while you were in the hospital after you are discharged, you can call the unit and asked to speak with the hospitalist on call if the hospitalist that took care of you is not available. Once you are discharged, your primary care physician will handle any further medical issues. Please note that NO REFILLS for any discharge medications will be authorized once you are discharged, as it is imperative that you return to your primary care physician (or establish a relationship with a primary care physician if you do not have one) for your aftercare needs so that they can reassess your need for medications and monitor your lab values.

## 2014-04-08 NOTE — Progress Notes (Signed)
Reviewed discharge paperwork with family and pt.  Prescriptions given to pt.  PIV removed.  Pt taken to discharge location via wheelchair.

## 2014-04-08 NOTE — Procedures (Signed)
ELECTROENCEPHALOGRAM REPORT   Patient: [name]       Room #: 3T53 EEG No. ID: [id number] Age: 81 y.o.        Sex: female Referring Physician: Candiss Norse Report Date:  04/08/2014        Interpreting Physician: Alexis Goodell  History: [name] is an 81 y.o. female with episodic neurological complaints.    Medications:  Scheduled: . aspirin  81 mg Oral Daily  . atorvastatin  80 mg Oral q morning - 10a  . clopidogrel  75 mg Oral Daily  . enoxaparin (LOVENOX) injection  40 mg Subcutaneous Q24H  . FLUoxetine  10 mg Oral Daily  . lactose free nutrition  237 mL Oral TID WC  . metoprolol tartrate  25 mg Oral BID    Conditions of Recording:  This is a 16 channel EEG carried out with the patient in the awake and drowsy states.  Description:  The waking background activity consists of a low voltage, symmetrical, fairly well organized, 10 Hz alpha activity, seen from the parieto-occipital and posterior temporal regions.  Low voltage fast activity, poorly organized, is seen anteriorly and is at times superimposed on more posterior regions.  A mixture of theta and alpha rhythms are seen from the central and temporal regions. The patient drowses with slowing to irregular, low voltage theta and beta activity.   Stage II sleep is not obtained. Hyperventilation and intermittent photic stimulation were not performed.  IMPRESSION: This is a normal awake and drowsy electroencephalogram.  No epileptiform activity is noted.     Alexis Goodell, MD [facility] [phone] 04/08/2014, 1:40 PM

## 2014-04-11 ENCOUNTER — Ambulatory Visit (INDEPENDENT_AMBULATORY_CARE_PROVIDER_SITE_OTHER): Payer: Medicare Other | Admitting: Neurology

## 2014-04-11 ENCOUNTER — Ambulatory Visit: Payer: Medicare Other | Admitting: Family

## 2014-04-11 ENCOUNTER — Encounter: Payer: Self-pay | Admitting: Neurology

## 2014-04-11 ENCOUNTER — Ambulatory Visit (INDEPENDENT_AMBULATORY_CARE_PROVIDER_SITE_OTHER)
Admission: RE | Admit: 2014-04-11 | Discharge: 2014-04-11 | Disposition: A | Discharge status: Home / Self Care | Payer: Medicare Other | Source: Ambulatory Visit | Attending: Family | Admitting: Family

## 2014-04-11 ENCOUNTER — Telehealth: Payer: Self-pay | Admitting: Family

## 2014-04-11 VITALS — BP 114/66 | HR 55 | Ht 63.0 in | Wt 125.0 lb

## 2014-04-11 DIAGNOSIS — I679 Cerebrovascular disease, unspecified: Secondary | ICD-10-CM | POA: Diagnosis not present

## 2014-04-11 DIAGNOSIS — M810 Age-related osteoporosis without current pathological fracture: Secondary | ICD-10-CM

## 2014-04-11 DIAGNOSIS — I10 Essential (primary) hypertension: Secondary | ICD-10-CM

## 2014-04-11 DIAGNOSIS — E785 Hyperlipidemia, unspecified: Secondary | ICD-10-CM

## 2014-04-11 DIAGNOSIS — R55 Syncope and collapse: Secondary | ICD-10-CM

## 2014-04-11 DIAGNOSIS — I639 Cerebral infarction, unspecified: Secondary | ICD-10-CM

## 2014-04-11 NOTE — Patient Instructions (Signed)
-   take ASA 81mg  and plavix 75mg  for 3 months and then plavix alone for stroke prevention - continue prozac and ask PCP regarding meds for acid reflux.  - check BP at least twice a day and record and bring over to PCP for BP meds adjustment. - Follow up with your primary care physician for stroke risk factor modification. Recommend maintain blood pressure goal <130/80, diabetes with hemoglobin A1c goal below 6.5% and lipids with LDL cholesterol goal below 70 mg/dL.  - follow up in 2 months.

## 2014-04-11 NOTE — Telephone Encounter (Signed)
Hospital follow up arranged for 04/15/14.  eal

## 2014-04-11 NOTE — Telephone Encounter (Signed)
Please contact pt to arrange hospital follow up. 

## 2014-04-12 ENCOUNTER — Emergency Department (HOSPITAL_BASED_OUTPATIENT_CLINIC_OR_DEPARTMENT_OTHER)
Admission: EM | Admit: 2014-04-12 | Discharge: 2014-04-12 | Disposition: A | Discharge status: Home / Self Care | Payer: Medicare Other | Attending: Emergency Medicine | Admitting: Emergency Medicine

## 2014-04-12 ENCOUNTER — Telehealth: Payer: Self-pay | Admitting: Family

## 2014-04-12 ENCOUNTER — Encounter (HOSPITAL_BASED_OUTPATIENT_CLINIC_OR_DEPARTMENT_OTHER): Payer: Self-pay | Admitting: Emergency Medicine

## 2014-04-12 DIAGNOSIS — Z7901 Long term (current) use of anticoagulants: Secondary | ICD-10-CM | POA: Diagnosis not present

## 2014-04-12 DIAGNOSIS — Z880 Allergy status to penicillin: Secondary | ICD-10-CM | POA: Diagnosis not present

## 2014-04-12 DIAGNOSIS — Z8673 Personal history of transient ischemic attack (TIA), and cerebral infarction without residual deficits: Secondary | ICD-10-CM | POA: Insufficient documentation

## 2014-04-12 DIAGNOSIS — I129 Hypertensive chronic kidney disease with stage 1 through stage 4 chronic kidney disease, or unspecified chronic kidney disease: Secondary | ICD-10-CM | POA: Diagnosis not present

## 2014-04-12 DIAGNOSIS — Z8659 Personal history of other mental and behavioral disorders: Secondary | ICD-10-CM | POA: Diagnosis not present

## 2014-04-12 DIAGNOSIS — E785 Hyperlipidemia, unspecified: Secondary | ICD-10-CM | POA: Insufficient documentation

## 2014-04-12 DIAGNOSIS — Z79899 Other long term (current) drug therapy: Secondary | ICD-10-CM | POA: Insufficient documentation

## 2014-04-12 DIAGNOSIS — Z7982 Long term (current) use of aspirin: Secondary | ICD-10-CM | POA: Insufficient documentation

## 2014-04-12 DIAGNOSIS — Z87828 Personal history of other (healed) physical injury and trauma: Secondary | ICD-10-CM | POA: Diagnosis not present

## 2014-04-12 DIAGNOSIS — I10 Essential (primary) hypertension: Secondary | ICD-10-CM

## 2014-04-12 DIAGNOSIS — R55 Syncope and collapse: Secondary | ICD-10-CM | POA: Insufficient documentation

## 2014-04-12 DIAGNOSIS — Z8669 Personal history of other diseases of the nervous system and sense organs: Secondary | ICD-10-CM | POA: Diagnosis not present

## 2014-04-12 DIAGNOSIS — Z91040 Latex allergy status: Secondary | ICD-10-CM | POA: Insufficient documentation

## 2014-04-12 DIAGNOSIS — F0390 Unspecified dementia without behavioral disturbance: Secondary | ICD-10-CM | POA: Diagnosis not present

## 2014-04-12 DIAGNOSIS — N183 Chronic kidney disease, stage 3 (moderate): Secondary | ICD-10-CM | POA: Insufficient documentation

## 2014-04-12 DIAGNOSIS — I679 Cerebrovascular disease, unspecified: Secondary | ICD-10-CM | POA: Insufficient documentation

## 2014-04-12 MED ORDER — ASPIRIN EC 81 MG PO TBEC: 81.0000 mg | DELAYED_RELEASE_TABLET | Freq: Every day | ORAL | Status: DC

## 2014-04-12 NOTE — ED Provider Notes (Signed)
CSN: [id number]     Arrival date & time 04/12/14  1945 History  This chart was scribed for Malvin Johns, MD by Tula Nakayama, ED Scribe. This patient was seen in room MH02/MH02 and the patient's care was started at 8:35 PM.    Chief Complaint  Patient presents with  . Hypertension   The history is provided by the patient and a relative. No language interpreter was used.    HPI Comments: [name] is a 81 y.o. female with a history of HTN and CVA who presents to the Emergency Department complaining of intermittent elevated blood pressure that started 2 days ago. Pt's daughter administered blood pressure medication 2 hours ago with no relief. She reports that pt was hospitalized last week for suspected TIA. Pt followed up with neurology, Dr. Erlinda Hong, yesterday who advised her to be evaluated in the ED with systolic BP of 591 or higher. Pt has a history of CVA that occurred on 1/25. She denies dizziness, slurred speech, visual disturbance, weakness, abdominal pain, vomiting, CP and SOB as associated symptoms.   PCP Debbrah Alar   Past Medical History  Diagnosis Date  . Hypertension   . Hyperlipemia   . Dementia   . Cerebral hemorrhage, nontraumatic 2011  . CVA (cerebral infarction) 2011    left cerebellum  . Ureterocele, acquired   . Broken arm     left x 3  . Brain swelling 2016  . History of pubovaginal sling   . Depression   . Chronic kidney disease     stage 3   Past Surgical History  Procedure Laterality Date  . Abdominal hysterectomy    . Cholecystectomy    . Fracture surgery Left     3 fractures in past  . Other surgical history  2006    "bladder tac"   Family History  Problem Relation Age of Onset  . Stroke Mother   . Diabetes Mother   . Hyperlipidemia Mother   . Hypertension Mother   . Stroke Father   . Heart disease Father   . Hyperlipidemia Father   . Depression Sister   . Cancer Brother 40    lung   History  Substance Use Topics  . Smoking  status: Former Research scientist (life sciences)  . Smokeless tobacco: Not on file  . Alcohol Use: No   OB History    No data available     Review of Systems  Constitutional: Negative for fever, chills, diaphoresis and fatigue.  HENT: Negative for congestion, rhinorrhea and sneezing.   Eyes: Negative.  Negative for visual disturbance.  Respiratory: Negative for cough, chest tightness and shortness of breath.   Cardiovascular: Negative for chest pain and leg swelling.  Gastrointestinal: Negative for nausea, vomiting, abdominal pain, diarrhea and blood in stool.  Genitourinary: Negative for frequency, hematuria, flank pain and difficulty urinating.  Musculoskeletal: Negative for back pain and arthralgias.  Skin: Negative for rash.  Neurological: Negative for dizziness, speech difficulty, weakness, numbness and headaches.   Allergies  Penicillins and Latex  Home Medications   Prior to Admission medications   Medication Sig Start Date End Date Taking? Authorizing Provider  alendronate (FOSAMAX) 70 MG tablet Take 1 tablet (70 mg total) by mouth once a week. Patient taking differently: Take 70 mg by mouth once a week. On Thursdays 03/31/14   Debbrah Alar, NP  aspirin EC 81 MG tablet Take 1 tablet (81 mg total) by mouth daily. 04/12/14   Rosalin Hawking, MD  atorvastatin (LIPITOR) 80 MG  tablet Take 1 tablet (80 mg total) by mouth every morning. 03/31/14   Debbrah Alar, NP  cholecalciferol (VITAMIN D) 1000 UNITS tablet Take 1,000 Units by mouth 2 (two) times daily.    Historical Provider, MD  clopidogrel (PLAVIX) 75 MG tablet Take 1 tablet (75 mg total) by mouth daily. 02/23/14   Lavon Paganini Angiulli, PA-C  FLUoxetine (PROZAC) 10 MG capsule Take 1 capsule (10 mg total) by mouth daily. 03/31/14   Debbrah Alar, NP  irbesartan (AVAPRO) 300 MG tablet Take 1 tablet by mouth daily. 03/21/14   Historical Provider, MD  lactose free nutrition (BOOST PLUS) LIQD Take 237 mLs by mouth 3 (three) times daily with  meals. Patient not taking: Reported on 04/08/2014 04/08/14   Thurnell Lose, MD  meclizine (ANTIVERT) 25 MG tablet Take 1 tablet (25 mg total) by mouth 3 (three) times daily as needed for dizziness. Patient not taking: Reported on 04/08/2014 02/23/14   Lavon Paganini Angiulli, PA-C  metoprolol tartrate (LOPRESSOR) 25 MG tablet Take 1 tablet (25 mg total) by mouth 2 (two) times daily. 04/08/14   Thurnell Lose, MD  Multiple Vitamin (MULTIVITAMIN WITH MINERALS) TABS Take 1 tablet by mouth daily.    Historical Provider, MD  omeprazole (PRILOSEC) 40 MG capsule Take 1 capsule (40 mg total) by mouth every morning. Patient not taking: Reported on 04/08/2014 02/23/14   Lavon Paganini Angiulli, PA-C  oxyCODONE-acetaminophen (PERCOCET/ROXICET) 5-325 MG per tablet Take 1 tablet by mouth as needed. 01/22/14   Historical Provider, MD  vitamin B-12 (CYANOCOBALAMIN) 1000 MCG tablet Take 1,000 mcg by mouth daily.    Historical Provider, MD   BP 195/74 mmHg  Pulse 56  Temp(Src) 98.3 F (36.8 C) (Oral)  Resp 18  Ht 5\' 3"  (1.6 m)  Wt 122 lb (55.339 kg)  BMI 21.62 kg/m2  SpO2 98% Physical Exam  Constitutional: She is oriented to person, place, and time. She appears well-developed and well-nourished.  HENT:  Head: Normocephalic and atraumatic.  Eyes: Pupils are equal, round, and reactive to light.  Neck: Normal range of motion. Neck supple.  Cardiovascular: Normal rate, regular rhythm and normal heart sounds.   Pulmonary/Chest: Effort normal and breath sounds normal. No respiratory distress. She has no wheezes. She has no rales. She exhibits no tenderness.  Abdominal: Soft. Bowel sounds are normal. There is no tenderness. There is no rebound and no guarding.  Musculoskeletal: Normal range of motion. She exhibits no edema.  Lymphadenopathy:    She has no cervical adenopathy.  Neurological: She is alert and oriented to person, place, and time. She has normal strength. No cranial nerve deficit or sensory deficit.  Finger to  nose intact, no pronator drift  Skin: Skin is warm and dry. No rash noted.  Psychiatric: She has a normal mood and affect.    ED Course  Procedures   DIAGNOSTIC STUDIES: Oxygen Saturation is 98% on RA, normal by my interpretation.    COORDINATION OF CARE: 8:49 PM Discussed treatment plan with pt and her daughter. Advised pt to return with sustained elevated BP or onset of CP or stroke symptoms. They agreed to plan.  Labs Review Labs Reviewed - No data to display  Imaging Review No results found.   EKG Interpretation None      MDM   Final diagnoses:  Essential hypertension    Patient presents with an elevation in her blood pressure. She has no symptoms related to this. She has no neurologic deficits. She has no chest  pain or shortness of breath.  Her blood pressure on my exam was 182/71. She was discharged home in good condition and instructed to call her primary care physician tomorrow regarding ongoing management of her elevated blood pressures. She didn't have a consistently elevated blood pressure that needed to be urgently lowered in the ED tonight.  I personally performed the services described in this documentation, which was scribed in my presence.  The recorded information has been reviewed and considered.    Malvin Johns, MD 04/12/14 [phone]

## 2014-04-12 NOTE — Telephone Encounter (Signed)
[facility] - Client TELEPHONE ADVICE RECORD [facility] Patient Name: [name] DOB: 1933-12-24 Initial Comment Caller states her mother's BP is 208/79 Nurse Assessment Nurse: Malva Cogan, RN, Juliann Pulse Date/Time (Eastern Time): 04/12/2014 6:58:01 PM Confirm and document reason for call. If symptomatic, describe symptoms. ---Caller states that her mother's BP was 208/79 approx 15" ago, is being treated for HTN. Has the patient traveled out of the country within the last 30 days? ---No Does the patient require triage? ---Yes Related visit to physician within the last 2 weeks? ---Yes was admitted to hospital last Wednesday secondary to possible TIA, BP meds were recently changed because of this Does the PT have any chronic conditions? (i.e. diabetes, asthma, etc.) ---Yes List chronic conditions. ---HTN, possible TIA, severe osteoporosis, cracked vertebrae secondary to osteoporosis, H/O brain hemorrhages that cause dementia, high cholesterol Guidelines Guideline Title Affirmed Question Affirmed Notes High Blood Pressure [1] BP # 200/120 AND [2] having NO cardiac or neurologic symptoms Final Disposition User See Physician within 4 Hours (or PCP triage) Malva Cogan, RN, Juliann Pulse

## 2014-04-12 NOTE — ED Notes (Signed)
Patient just took BP meds about an hour ago, the patient has had 2 day hx. Of elevated BP - has a History of elevated BP with associated TIA and CVA. Patient denies any symptoms of elevated BP

## 2014-04-12 NOTE — Discharge Instructions (Signed)

## 2014-04-12 NOTE — Progress Notes (Signed)
STROKE NEUROLOGY FOLLOW UP NOTE  NAME: [name] DOB: Aug 23, 1933  REASON FOR VISIT: stroke follow up HISTORY FROM: son and daughter and chart and pt  Today we had the pleasure of seeing [name] in follow-up at our Neurology Clinic. Pt was accompanied by son and daughter.   History Summary [name] is a 81 y.o. female with history of hypertension, hyperlipidemia, dementia, left punctate cerebellar stroke in 2011 and ureterocele presenting with new onset nausea and vomiting as well as dizziness and unstable gait. MRI showed left PICA infarct with petichial hemorrhagic transformation. MRA showed stenosis of left PICA and high grade stenosis of right P2. CTA head and neck also showed left VA athero and b/l ICA athero. She was put on 3% saline for hydrocephalus, repeat CT showed improving hydrocephalus. Stroke etiology not clear, large vessel athero most likely but not able to rule out cardioembolic. However, she was deemed not a good candidate for anticoagulation due to baseline dementia and fall risk. She was discharged to CIR with plavix and lipitor.  She also had punctate left cerebellar stroke in 2011, considered lacunar stroke and not sure if work up was done at that time.   Interval History During the interval time, the patient has been doing better. She was discharged from CIR after 3 weeks of extensive rehab. She is currently at home. She was admitted on 04/06/14 for likely a syncope episode. She was noted to have high BP at 177 that day earlier and then she was standing in bathroom developed diaphoresis, lightheadedness, no vertigo, she had to sat down, family found she was also confused, not answering questions. EMS called and BP was 120s. She was admitted for syncope vs. TIA. MRI done negative for acute abnormalities. EEG normal. Event was felt to be syncope. Of note, she also had one orthostatic hypotension when she worked with PT first time in neuro ICU on last  admission. She was discharged in good condition 3 days ago and she is doing well since then. BP today 114/66.   Prilosec was discontinued by family since she is on plavix. She continues to have memory difficulties. She is taking Prozac for depression.   REVIEW OF SYSTEMS: Full 14 system review of systems performed and notable only for those listed below and in HPI above, all others are negative:  Constitutional:  Activity change, appetite change, fatigue, unexpected wt change Cardiovascular:  Ear/Nose/Throat:  Hearing loss, ringing in ears, runny nose Skin:  Eyes:  Eye itching, loss of vision Respiratory:  cough Gastroitestinal:  constipation Genitourinary: incontinence of bladder Hematology/Lymphatic:   Endocrine: cold intolerance Musculoskeletal:  Back pain, walking difficulty Allergy/Immunology:   Neurological:  Memory loss, dizziness, weakness, tremors Psychiatric: agitation, confusion, decreased concentration, depression, nervous/anxious Sleep: snoring  The following represents the patient's updated allergies and side effects list: Allergies  Allergen Reactions  . Penicillins     Reaction: unknown  . Latex Rash    The neurologically relevant items on the patient's problem list were reviewed on today's visit.  Neurologic Examination  A problem focused neurological exam (12 or more points of the single system neurologic examination, vital signs counts as 1 point, cranial nerves count for 8 points) was performed.  Blood pressure 114/66, pulse 55, height 5\' 3"  (1.6 m), weight 125 lb (56.7 kg).  General - Well nourished, well developed, in no apparent distress.  Ophthalmologic - fundi not visualized due to small pupils.  Cardiovascular - Regular rate and rhythm.  Mental Status -  Level of arousal and orientation to time, place, and person were intact. Language including expression, naming, repetition, comprehension was assessed and found intact.  mini-mental status  exam  Orientation to time - 4/5 Orientation to place - 5/5 Registration - 3/3 Attention - 3/5 Delayed recall - 2/3 Naming - 2/2 Repetition - 0/1 Comprehension - 3/3 Reading - 1/1 Writing - 1/1 Visuospatial - 0/1  Total 24/30  Cranial Nerves II - XII - II - Visual field intact OU. III, IV, VI - Extraocular movements intact. V - Facial sensation intact bilaterally. VII - Facial movement intact bilaterally. VIII - Hearing & vestibular intact bilaterally. X - Palate elevates symmetrically. XI - Chin turning & shoulder shrug intact bilaterally. XII - Tongue protrusion intact.  Motor Strength - The patient's strength was 4+/5 in all extremities and pronator drift was absent.  Bulk was normal and fasciculations were absent.   Motor Tone - Muscle tone was assessed at the neck and appendages and was normal.  Reflexes - The patient's reflexes were 1+ in all extremities and she had no pathological reflexes.  Sensory - Light touch, temperature/pinprick were assessed and were normal.    Coordination - The patient had normal movements in the hands and feet with no ataxia or dysmetria.  Tremor was absent.  Gait and Station - slow, cautious gait with mild stooped posturing.  Data reviewed: I personally reviewed the images and agree with the radiology interpretations.  Ct Angio Head and neck W/cm &/or Wo Cm  02/03/2014 IMPRESSION: Acute infarct in the left inferior cerebellum in the PICA territory shows progressive edema and swelling with mass effect on the fourth ventricle. No obstructive hydrocephalus or hemorrhage identified. Left PICA is now patent. Both vertebral arteries are widely patent. Bilateral carotid atherosclerotic disease. Ulcerated plaque involving the right common carotid artery. No significant internal carotid artery stenosis. Moderately severe stenosis proximal right external carotid artery. 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 Electronically  Signed By: Franchot Gallo M.D. On: 02/03/2014 07:34   Dg Chest 2 View  01/31/2014 IMPRESSION: No acute cardiopulmonary disease. Lower lung volumes than on prior with basilar atelectasis.  Ct Head Wo Contrast  02/07/14 - 1. Confluent left PICA territory with petechial hemorrhage, edema, and posterior fossa mass effect. Stable partially effaced basilar cisterns. 2. Interval mild regression of the acute ventriculomegaly, suggesting partial resolution of the fourth ventricle mass effect. 3. No new intracranial abnormality identified.  02/05/2014 IMPRESSION: Evolving LEFT posterior inferior cerebellar artery territory infarct with worsening edema, progressive fourth ventricle and cerebral aqueduct effacement resulting in early obstructive hydrocephalus. Worsening sphenoid sinusitis.   02/02/2014 IMPRESSION: Evolving LEFT posterior inferior cerebellar artery territory infarct with worsening edema, partial effacement of the fourth ventricle without obstructive hydrocephalus. No hemorrhagic conversion.   01/31/2014 IMPRESSION: Subacute nonhemorrhagic infarction involving a large portion of the left cerebellar hemisphere inferiorly as well as a small portion of the right cerebellar hemisphere inferomedially. Minimal mass effect on the fourth ventricle. Moderate atrophy and microvascular disease in the cerebral hemispheres. Sphenoid sinus disease bilaterally.   Mri and Mra Brain Wo Contrast  02/01/2014 IMPRESSION: MRI HEAD: Acute LEFT posterior inferior cerebellar artery territory infarct with petechial hemorrhage. Scattered foci of susceptibility artifact in a pattern suggesting sequelae of chronic hypertension. Acute sphenoid sinusitis. MRA HEAD: Thready irregular LEFT posterior-inferior cerebellar artery, this could reflect atherosclerosis or mass effect from acute infarct. Mid grade stenosis of RIGHT P2 segment with luminal irregularity of the bilateral posterior cerebral  arteries consistent with atherosclerosis.  MRI brain 04/07/14 No acute intracranial process, specifically no acute ischemia. Subacute to chronic LEFT posterior inferior cerebellar artery territory infarct with sequelae of petechial hemorrhage/ mineralization. Similar scattered susceptibility artifact suggesting sequelae of chronic hypertension. White matter changes suggest mild chronic small vessel ischemic disease. Worsening mild to moderate sphenoid sinusitis.  Carotid Doppler There is 1-39% bilateral ICA stenosis. Vertebral artery flow is antegrade.   2D echo - Left ventricle: The cavity size was normal. Wall thickness was normal. Systolic function was normal. The estimated ejection fraction was in the range of 55% to 60%. Wall motion was normal; there were no regional wall motion abnormalities. Doppler parameters are consistent with abnormal left ventricular relaxation (grade 1 diastolic dysfunction).  Impressions: - Normal LV function; grade 1 diastolic dysfunction; no significant valvular regurgitation noted.  EEG 04/08/14 - This is a normal awake and drowsy electroencephalogram. No  epileptiform activity is noted.   Component     Latest Ref Rng 02/01/2014  Cholesterol     0 - 200 mg/dL 126  Triglycerides     <150 mg/dL 46  HDL     >39 mg/dL 46  Total CHOL/HDL Ratio      2.7  VLDL     0 - 40 mg/dL 9  LDL (calc)     0 - 99 mg/dL 71  Hemoglobin A1C     <5.7 % 5.6  Mean Plasma Glucose     <117 mg/dL 114    Assessment: As you may recall, she is a 81 y.o. Caucasian female with PMH of hypertension, hyperlipidemia, dementia, left punctate cerebellar stroke in 2011 was admitted on 01/31/14 due to left PICA cerebellar infarcts with petechial hemorrhagic transformation. Mild hydrocephalus during admission but resolved without intervention. MRA and CTA showed multivessel intracranial stenosis. She was put on plavix and lipitor for 3 months. She recovered well  and d/c from CIR. Last week, she had one syncope episode and was admitted. MRI and EEG negative. Of note, she also had one near syncope at his first PT session during last admission. She was asked to take ASA and plavix for stroke prevention by Dr. Doy Mince. Family stopped prilosec due to potential interaction with plavix.    Plan:  - agree with ASA 81 and plavix for 3 months and then plavix alone for stroke prevention - continue lipitor for stroke prevention - check BP closely at home and record to see the trend. If orthostatic hypotension, may consider medical treatment with droxidopa. - Follow up with your primary care physician for stroke risk factor modification. Recommend maintain blood pressure goal <130/80, diabetes with hemoglobin A1c goal below 6.5% and lipids with LDL cholesterol goal below 70 mg/dL.  - RTC in 2 months.  No orders of the defined types were placed in this encounter.    Meds ordered this encounter  Medications  . aspirin EC 81 MG tablet    Sig: Take 1 tablet (81 mg total) by mouth daily.    Dispense:  90 tablet    Refill:  0    Patient Instructions  - take ASA 81mg  and plavix 75mg  for 3 months and then plavix alone for stroke prevention - continue prozac and ask PCP regarding meds for acid reflux.  - check BP at least twice a day and record and bring over to PCP for BP meds adjustment. - Follow up with your primary care physician for stroke risk factor modification. Recommend maintain blood pressure goal <130/80, diabetes with hemoglobin A1c goal below 6.5%  and lipids with LDL cholesterol goal below 70 mg/dL.  - follow up in 2 months.    Rosalin Hawking, MD PhD [facility] [address], [place],  [address] [phone]

## 2014-04-14 ENCOUNTER — Telehealth: Payer: Self-pay | Admitting: Family

## 2014-04-14 DIAGNOSIS — M810 Age-related osteoporosis without current pathological fracture: Secondary | ICD-10-CM

## 2014-04-14 NOTE — Telephone Encounter (Signed)
Bone density shows osteoporosis. Add caltrate 600mg  + D bid.  I would also like her to complete a PTH to rule hormonal cause for osteoporosis.  Continue fosamax.

## 2014-04-15 ENCOUNTER — Ambulatory Visit (INDEPENDENT_AMBULATORY_CARE_PROVIDER_SITE_OTHER): Payer: Medicare Other | Admitting: Physician Assistant

## 2014-04-15 ENCOUNTER — Encounter: Payer: Self-pay | Admitting: Physician Assistant

## 2014-04-15 VITALS — BP 141/47 | HR 55 | Temp 98.3°F | Resp 14 | Ht 63.0 in | Wt 120.2 lb

## 2014-04-15 DIAGNOSIS — I10 Essential (primary) hypertension: Secondary | ICD-10-CM | POA: Diagnosis not present

## 2014-04-15 DIAGNOSIS — I951 Orthostatic hypotension: Secondary | ICD-10-CM | POA: Diagnosis not present

## 2014-04-15 MED ORDER — RANITIDINE HCL 150 MG PO TABS: 150.0000 mg | ORAL_TABLET | Freq: Every day | ORAL | Status: AC

## 2014-04-15 NOTE — Patient Instructions (Signed)
Please continue current medication regimen. Stay well hydrated. Try to eat more frequently. Please stop the Prilosec and begin the Zantac as directed. Continue TED hose.  Please get a manual blood pressure cuff and check BP once daily at least 1 hour after taking BP medication.  Call myself or Melissa in 1 week with BP log.   Follow-up with Neurology as directed.  Vasovagal Syncope, Adult Syncope, commonly known as fainting, is a temporary loss of consciousness. It occurs when the blood flow to the brain is reduced. Vasovagal syncope (also called neurocardiogenic syncope) is a fainting spell in which the blood flow to the brain is reduced because of a sudden drop in heart rate and blood pressure. Vasovagal syncope occurs when the brain and the cardiovascular system (blood vessels) do not adequately communicate and respond to each other. This is the most common cause of fainting. It often occurs in response to fear or some other type of emotional or physical stress. The body has a reaction in which the heart starts beating too slowly or the blood vessels expand, reducing blood pressure. This type of fainting spell is generally considered harmless. However, injuries can occur if a person takes a sudden fall during a fainting spell.  CAUSES  Vasovagal syncope occurs when a person's blood pressure and heart rate decrease suddenly, usually in response to a trigger. Many things and situations can trigger an episode. Some of these include:   Pain.   Fear.   The sight of blood or medical procedures, such as blood being drawn from a vein.   Common activities, such as coughing, swallowing, stretching, or going to the bathroom.   Emotional stress.   Prolonged standing, especially in a warm environment.   Lack of sleep or rest.   Prolonged lack of food.   Prolonged lack of fluids.   Recent illness.  The use of certain drugs that affect blood pressure, such as cocaine, alcohol,  marijuana, inhalants, and opiates.  SYMPTOMS  Before the fainting episode, you may:   Feel dizzy or light headed.   Become pale.  Sense that you are going to faint.   Feel like the room is spinning.   Have tunnel vision, only seeing directly in front of you.   Feel sick to your stomach (nauseous).   See spots or slowly lose vision.   Hear ringing in your ears.   Have a headache.   Feel warm and sweaty.   Feel a sensation of pins and needles. During the fainting spell, you will generally be unconscious for no longer than a couple minutes before waking up and returning to normal. If you get up too quickly before your body can recover, you may faint again. Some twitching or jerky movements may occur during the fainting spell.  DIAGNOSIS  Your caregiver will ask about your symptoms, take a medical history, and perform a physical exam. Various tests may be done to rule out other causes of fainting. These may include blood tests and tests to check the heart, such as electrocardiography, echocardiography, and possibly an electrophysiology study. When other causes have been ruled out, a test may be done to check the body's response to changes in position (tilt table test). TREATMENT  Most cases of vasovagal syncope do not require treatment. Your caregiver may recommend ways to avoid fainting triggers and may provide home strategies for preventing fainting. If you must be exposed to a possible trigger, you can drink additional fluids to help reduce your chances  of having an episode of vasovagal syncope. If you have warning signs of an oncoming episode, you can respond by positioning yourself favorably (lying down). If your fainting spells continue, you may be given medicines to prevent fainting. Some medicines may help make you more resistant to repeated episodes of vasovagal syncope. Special exercises or compression stockings may be recommended. In rare cases, the surgical placement  of a pacemaker is considered. HOME CARE INSTRUCTIONS   Learn to identify the warning signs of vasovagal syncope.   Sit or lie down at the first warning sign of a fainting spell. If sitting, put your head down between your legs. If you lie down, swing your legs up in the air to increase blood flow to the brain.   Avoid hot tubs and saunas.  Avoid prolonged standing.  Drink enough fluids to keep your urine clear or pale yellow. Avoid caffeine.  Increase salt in your diet as directed by your caregiver.   If you have to stand for a long time, perform movements such as:   Crossing your legs.   Flexing and stretching your leg muscles.   Squatting.   Moving your legs.   Bending over.   Only take over-the-counter or prescription medicines as directed by your caregiver. Do not suddenly stop any medicines without asking your caregiver first. SEEK MEDICAL CARE IF:   Your fainting spells continue or happen more frequently in spite of treatment.   You lose consciousness for more than a couple minutes.  You have fainting spells during or after exercising or after being startled.   You have new symptoms that occur with the fainting spells, such as:   Shortness of breath.  Chest pain.   Irregular heartbeat.   You have episodes of twitching or jerky movements that last longer than a few seconds.  You have episodes of twitching or jerky movements without obvious fainting. SEEK IMMEDIATE MEDICAL CARE IF:   You have injuries or bleeding after a fainting spell.   You have episodes of twitching or jerky movements that last longer than 5 minutes.   You have more than one spell of twitching or jerky movements before returning to consciousness after fainting. MAKE SURE YOU:   Understand these instructions.  Will watch your condition.  Will get help right away if you are not doing well or get worse. Document Released: 12/11/2011 Document Reviewed:  12/11/2011 [facility] Patient Information 2015 [place]. This information is not intended to replace advice given to you by your health care provider. Make sure you discuss any questions you have with your health care provider.

## 2014-04-15 NOTE — Progress Notes (Signed)
Patient with PMH significant for hypertension and CVA presents to clinic today with family for ER follow-up of hypertension. Patient recently admitted to hospital for acute CVA and orthostatic hypotension. Was taken off of her irbesartan due to 60 mm Hg drop in BP on standing. Was told to remain on her Metoprolol as directed. Patient had follow-up with Neurologist where BP was noted to be elevated.  Was told to go to the ER for evaluation.  BP improved at time of ER visit.  Asymptomatic so patient instructed to follow-up with Primary Care.  Patient endorses feeling well overall.  Has been making sure to take her BP as directed.  Denies chest pain, palpitations, lightheadedness or dizziness.  Is wearing compression stockings per son. Son noted BP at home has been elevated to 170/90s.  Does endorse using an electronic BP cuff.  States when rechecked manually, BP is 140/80s.  States home RN has told them manual BP measurements are better.   Past Medical History  Diagnosis Date  . Hypertension   . Hyperlipemia   . Dementia   . Cerebral hemorrhage, nontraumatic 2011  . CVA (cerebral infarction) 2011    left cerebellum  . Ureterocele, acquired   . Broken arm     left x 3  . Brain swelling 2016  . History of pubovaginal sling   . Depression   . Chronic kidney disease     stage 3  . Stroke 2015 & 2016    Dec; Jan, blockage cerebellum    Current Outpatient Prescriptions on File Prior to Visit  Medication Sig Dispense Refill  . alendronate (FOSAMAX) 70 MG tablet Take 1 tablet (70 mg total) by mouth once a week. (Patient taking differently: Take 70 mg by mouth once a week. On Thursdays) 12 tablet 1  . aspirin EC 81 MG tablet Take 1 tablet (81 mg total) by mouth daily. 90 tablet 0  . atorvastatin (LIPITOR) 80 MG tablet Take 1 tablet (80 mg total) by mouth every morning. 90 tablet 1  . Calcium Carbonate-Vitamin D 600-400 MG-UNIT per tablet Take 1 tablet by mouth 2 (two) times daily.    .  cholecalciferol (VITAMIN D) 1000 UNITS tablet Take 1,000 Units by mouth 2 (two) times daily.    . clopidogrel (PLAVIX) 75 MG tablet Take 1 tablet (75 mg total) by mouth daily. 30 tablet 1  . FLUoxetine (PROZAC) 10 MG capsule Take 1 capsule (10 mg total) by mouth daily. 90 capsule 1  . metoprolol tartrate (LOPRESSOR) 25 MG tablet Take 1 tablet (25 mg total) by mouth 2 (two) times daily. 60 tablet 0  . Multiple Vitamin (MULTIVITAMIN WITH MINERALS) TABS Take 1 tablet by mouth daily.    . vitamin B-12 (CYANOCOBALAMIN) 1000 MCG tablet Take 1,000 mcg by mouth daily.     No current facility-administered medications on file prior to visit.    Allergies  Allergen Reactions  . Penicillins     Reaction: unknown  . Adhesive [Tape] Rash  . Latex Rash    Family History  Problem Relation Age of Onset  . Stroke Mother   . Diabetes Mother   . Hyperlipidemia Mother   . Hypertension Mother   . Stroke Father   . Heart disease Father   . Hyperlipidemia Father   . Depression Sister   . Cancer Brother 40    lung    History   Social History  . Marital Status: Married    Spouse Name: Trilby Drummer  .  Number of Children: 3  . Years of Education: 6th   Occupational History  . Retired    Social History Main Topics  . Smoking status: Former Research scientist (life sciences)  . Smokeless tobacco: Not on file  . Alcohol Use: No  . Drug Use: No  . Sexual Activity: No   Other Topics Concern  . None   Social History Narrative   Former Nurse, children's   Completed 6th grade   Married   2 sons and 1 daughter ( lives with daughter)   No hobbies    Review of Systems - See HPI.  All other ROS are negative.  BP 141/47 mmHg  Pulse 55  Temp(Src) 98.3 F (36.8 C) (Oral)  Resp 14  Ht 5' 3"  (1.6 m)  Wt 120 lb 4 oz (54.545 kg)  BMI 21.31 kg/m2  SpO2 99%  Physical Exam  Constitutional: She is well-developed, well-nourished, and in no distress.  HENT:  Head: Normocephalic and atraumatic.  Eyes: Conjunctivae are normal.    Neck: Neck supple.  Cardiovascular: Normal rate, regular rhythm, normal heart sounds and intact distal pulses.   Pulmonary/Chest: Effort normal and breath sounds normal. No respiratory distress. She has no wheezes. She has no rales. She exhibits no tenderness.  Lymphadenopathy:    She has no cervical adenopathy.  Neurological: She is alert. No cranial nerve deficit.  Skin: Skin is warm and dry. No rash noted.  Psychiatric: Affect normal.  Vitals reviewed.   Recent Results (from the past 2160 hour(s))  Comprehensive metabolic panel     Status: Abnormal   Collection Time: 01/31/14  4:04 PM  Result Value Ref Range   Sodium 135 135 - 145 mmol/L   Potassium 4.2 3.5 - 5.1 mmol/L   Chloride 103 96 - 112 mmol/L   CO2 25 19 - 32 mmol/L   Glucose, Bld 122 (H) 70 - 99 mg/dL   BUN 8 6 - 23 mg/dL   Creatinine, Ser 0.92 0.50 - 1.10 mg/dL   Calcium 9.0 8.4 - 10.5 mg/dL   Total Protein 6.6 6.0 - 8.3 g/dL   Albumin 3.6 3.5 - 5.2 g/dL   AST 28 0 - 37 U/L   ALT 25 0 - 35 U/L   Alkaline Phosphatase 143 (H) 39 - 117 U/L   Total Bilirubin 0.6 0.3 - 1.2 mg/dL   GFR calc non Af Amer 58 (L) >90 mL/min   GFR calc Af Amer 67 (L) >90 mL/min    Comment: (NOTE) The eGFR has been calculated using the CKD EPI equation. This calculation has not been validated in all clinical situations. eGFR's persistently <90 mL/min signify possible Chronic Kidney Disease.    Anion gap 7 5 - 15  CBC with Differential     Status: Abnormal   Collection Time: 01/31/14  4:04 PM  Result Value Ref Range   WBC 5.9 4.0 - 10.5 K/uL   RBC 3.88 3.87 - 5.11 MIL/uL   Hemoglobin 12.6 12.0 - 15.0 g/dL   HCT 36.2 36.0 - 46.0 %   MCV 93.3 78.0 - 100.0 fL   MCH 32.5 26.0 - 34.0 pg   MCHC 34.8 30.0 - 36.0 g/dL   RDW 11.9 11.5 - 15.5 %   Platelets 281 150 - 400 K/uL   Neutrophils Relative % 85 (H) 43 - 77 %   Neutro Abs 5.0 1.7 - 7.7 K/uL   Lymphocytes Relative 10 (L) 12 - 46 %   Lymphs Abs 0.6 (L) 0.7 - 4.0  K/uL   Monocytes  Relative 5 3 - 12 %   Monocytes Absolute 0.3 0.1 - 1.0 K/uL   Eosinophils Relative 0 0 - 5 %   Eosinophils Absolute 0.0 0.0 - 0.7 K/uL   Basophils Relative 0 0 - 1 %   Basophils Absolute 0.0 0.0 - 0.1 K/uL  MRSA PCR Screening     Status: None   Collection Time: 01/31/14  8:04 PM  Result Value Ref Range   MRSA by PCR NEGATIVE NEGATIVE    Comment:        The GeneXpert MRSA Assay (FDA approved for NASAL specimens only), is one component of a comprehensive MRSA colonization surveillance program. It is not intended to diagnose MRSA infection nor to guide or monitor treatment for MRSA infections.   Urinalysis, Routine w reflex microscopic     Status: Abnormal   Collection Time: 01/31/14 11:57 PM  Result Value Ref Range   Color, Urine YELLOW YELLOW   APPearance CLEAR CLEAR   Specific Gravity, Urine 1.012 1.005 - 1.030   pH 8.0 5.0 - 8.0   Glucose, UA NEGATIVE NEGATIVE mg/dL   Hgb urine dipstick NEGATIVE NEGATIVE   Bilirubin Urine NEGATIVE NEGATIVE   Ketones, ur 15 (A) NEGATIVE mg/dL   Protein, ur NEGATIVE NEGATIVE mg/dL   Urobilinogen, UA 0.2 0.0 - 1.0 mg/dL   Nitrite NEGATIVE NEGATIVE   Leukocytes, UA TRACE (A) NEGATIVE  Urine microscopic-add on     Status: None   Collection Time: 01/31/14 11:57 PM  Result Value Ref Range   Squamous Epithelial / LPF RARE RARE   WBC, UA 0-2 <3 WBC/hpf   RBC / HPF 0-2 <3 RBC/hpf   Bacteria, UA RARE RARE  Hemoglobin A1c     Status: None   Collection Time: 02/01/14  3:10 AM  Result Value Ref Range   Hgb A1c MFr Bld 5.6 <5.7 %    Comment: (NOTE)                                                                       According to the ADA Clinical Practice Recommendations for 2011, when HbA1c is used as a screening test:  >=6.5%   Diagnostic of Diabetes Mellitus           (if abnormal result is confirmed) 5.7-6.4%   Increased risk of developing Diabetes Mellitus References:Diagnosis and Classification of Diabetes  [place],Diabetes MMNO,1771,16(FBXUX 1):S62-S69 and Standards of Medical Care in         Diabetes - 2011,Diabetes Care,2011,34 (Suppl 1):S11-S61.    Mean Plasma Glucose 114 <117 mg/dL    Comment: Performed at [facility]  Lipid panel     Status: None   Collection Time: 02/01/14  3:10 AM  Result Value Ref Range   Cholesterol 126 0 - 200 mg/dL   Triglycerides 46 <150 mg/dL   HDL 46 >39 mg/dL   Total CHOL/HDL Ratio 2.7 RATIO   VLDL 9 0 - 40 mg/dL   LDL Cholesterol 71 0 - 99 mg/dL    Comment:        Total Cholesterol/HDL:CHD Risk Coronary Heart Disease Risk Table  Men   Women  1/2 Average Risk   3.4   3.3  Average Risk       5.0   4.4  2 X Average Risk   9.6   7.1  3 X Average Risk  23.4   11.0        Use the calculated Patient Ratio above and the CHD Risk Table to determine the patient's CHD Risk.        ATP III CLASSIFICATION (LDL):  <100     mg/dL   Optimal  100-129  mg/dL   Near or Above                    Optimal  130-159  mg/dL   Borderline  160-189  mg/dL   High  >190     mg/dL   Very High   Comprehensive metabolic panel     Status: Abnormal   Collection Time: 02/02/14  3:39 AM  Result Value Ref Range   Sodium 130 (L) 135 - 145 mmol/L   Potassium 3.9 3.5 - 5.1 mmol/L   Chloride 99 96 - 112 mmol/L   CO2 22 19 - 32 mmol/L   Glucose, Bld 109 (H) 70 - 99 mg/dL   BUN 8 6 - 23 mg/dL   Creatinine, Ser 0.93 0.50 - 1.10 mg/dL   Calcium 8.5 8.4 - 10.5 mg/dL   Total Protein 6.0 6.0 - 8.3 g/dL   Albumin 3.3 (L) 3.5 - 5.2 g/dL   AST 26 0 - 37 U/L   ALT 20 0 - 35 U/L   Alkaline Phosphatase 127 (H) 39 - 117 U/L   Total Bilirubin 1.0 0.3 - 1.2 mg/dL   GFR calc non Af Amer 57 (L) >90 mL/min   GFR calc Af Amer 66 (L) >90 mL/min    Comment: (NOTE) The eGFR has been calculated using the CKD EPI equation. This calculation has not been validated in all clinical situations. eGFR's persistently <90 mL/min signify possible Chronic Kidney Disease.     Anion gap 9 5 - 15  CBC with Differential/Platelet     Status: Abnormal   Collection Time: 02/02/14  3:39 AM  Result Value Ref Range   WBC 10.0 4.0 - 10.5 K/uL   RBC 3.82 (L) 3.87 - 5.11 MIL/uL   Hemoglobin 12.2 12.0 - 15.0 g/dL   HCT 34.6 (L) 36.0 - 46.0 %   MCV 90.6 78.0 - 100.0 fL   MCH 31.9 26.0 - 34.0 pg   MCHC 35.3 30.0 - 36.0 g/dL   RDW 11.7 11.5 - 15.5 %   Platelets 234 150 - 400 K/uL   Neutrophils Relative % 86 (H) 43 - 77 %   Neutro Abs 8.6 (H) 1.7 - 7.7 K/uL   Lymphocytes Relative 9 (L) 12 - 46 %   Lymphs Abs 0.9 0.7 - 4.0 K/uL   Monocytes Relative 5 3 - 12 %   Monocytes Absolute 0.5 0.1 - 1.0 K/uL   Eosinophils Relative 0 0 - 5 %   Eosinophils Absolute 0.0 0.0 - 0.7 K/uL   Basophils Relative 0 0 - 1 %   Basophils Absolute 0.0 0.0 - 0.1 K/uL  Magnesium     Status: None   Collection Time: 02/02/14  3:39 AM  Result Value Ref Range   Magnesium 1.7 1.5 - 2.5 mg/dL  Comprehensive metabolic panel     Status: Abnormal   Collection Time: 02/03/14  4:40 AM  Result Value Ref Range  Sodium 132 (L) 135 - 145 mmol/L   Potassium 3.7 3.5 - 5.1 mmol/L   Chloride 98 96 - 112 mmol/L   CO2 26 19 - 32 mmol/L   Glucose, Bld 93 70 - 99 mg/dL   BUN 11 6 - 23 mg/dL   Creatinine, Ser 0.84 0.50 - 1.10 mg/dL   Calcium 8.6 8.4 - 10.5 mg/dL   Total Protein 6.1 6.0 - 8.3 g/dL   Albumin 3.2 (L) 3.5 - 5.2 g/dL   AST 31 0 - 37 U/L   ALT 20 0 - 35 U/L   Alkaline Phosphatase 120 (H) 39 - 117 U/L   Total Bilirubin 1.0 0.3 - 1.2 mg/dL   GFR calc non Af Amer 64 (L) >90 mL/min   GFR calc Af Amer 75 (L) >90 mL/min    Comment: (NOTE) The eGFR has been calculated using the CKD EPI equation. This calculation has not been validated in all clinical situations. eGFR's persistently <90 mL/min signify possible Chronic Kidney Disease.    Anion gap 8 5 - 15  Comprehensive metabolic panel     Status: Abnormal   Collection Time: 02/05/14  2:55 AM  Result Value Ref Range   Sodium 134 (L) 135 - 145  mmol/L   Potassium 3.2 (L) 3.5 - 5.1 mmol/L   Chloride 103 96 - 112 mmol/L   CO2 26 19 - 32 mmol/L   Glucose, Bld 87 70 - 99 mg/dL   BUN 11 6 - 23 mg/dL   Creatinine, Ser 0.82 0.50 - 1.10 mg/dL   Calcium 8.4 8.4 - 10.5 mg/dL   Total Protein 5.2 (L) 6.0 - 8.3 g/dL   Albumin 2.8 (L) 3.5 - 5.2 g/dL   AST 28 0 - 37 U/L   ALT 17 0 - 35 U/L   Alkaline Phosphatase 101 39 - 117 U/L   Total Bilirubin 0.7 0.3 - 1.2 mg/dL   GFR calc non Af Amer 66 (L) >90 mL/min   GFR calc Af Amer 77 (L) >90 mL/min    Comment: (NOTE) The eGFR has been calculated using the CKD EPI equation. This calculation has not been validated in all clinical situations. eGFR's persistently <90 mL/min signify possible Chronic Kidney Disease.    Anion gap 5 5 - 15  CBC     Status: Abnormal   Collection Time: 02/05/14  2:55 AM  Result Value Ref Range   WBC 5.5 4.0 - 10.5 K/uL   RBC 3.67 (L) 3.87 - 5.11 MIL/uL   Hemoglobin 11.9 (L) 12.0 - 15.0 g/dL   HCT 32.8 (L) 36.0 - 46.0 %   MCV 89.4 78.0 - 100.0 fL   MCH 32.4 26.0 - 34.0 pg   MCHC 36.3 (H) 30.0 - 36.0 g/dL   RDW 11.8 11.5 - 15.5 %   Platelets 213 150 - 400 K/uL  Magnesium     Status: None   Collection Time: 02/05/14  2:55 AM  Result Value Ref Range   Magnesium 1.8 1.5 - 2.5 mg/dL  Sodium     Status: None   Collection Time: 02/05/14  7:42 PM  Result Value Ref Range   Sodium 135 135 - 145 mmol/L  Protime-INR     Status: None   Collection Time: 02/05/14  7:42 PM  Result Value Ref Range   Prothrombin Time 13.6 11.6 - 15.2 seconds   INR 1.03 0.00 - 1.49  Sodium     Status: None   Collection Time: 02/06/14  2:50 AM  Result Value Ref Range   Sodium 135 135 - 145 mmol/L  Comprehensive metabolic panel     Status: Abnormal   Collection Time: 02/06/14  4:40 AM  Result Value Ref Range   Sodium 137 135 - 145 mmol/L   Potassium 3.0 (L) 3.5 - 5.1 mmol/L   Chloride 103 96 - 112 mmol/L   CO2 24 19 - 32 mmol/L   Glucose, Bld 97 70 - 99 mg/dL   BUN 6 6 - 23 mg/dL    Creatinine, Ser 0.82 0.50 - 1.10 mg/dL   Calcium 8.3 (L) 8.4 - 10.5 mg/dL   Total Protein 5.8 (L) 6.0 - 8.3 g/dL   Albumin 3.0 (L) 3.5 - 5.2 g/dL   AST 54 (H) 0 - 37 U/L   ALT 39 (H) 0 - 35 U/L   Alkaline Phosphatase 111 39 - 117 U/L   Total Bilirubin 1.0 0.3 - 1.2 mg/dL   GFR calc non Af Amer 66 (L) >90 mL/min   GFR calc Af Amer 77 (L) >90 mL/min    Comment: (NOTE) The eGFR has been calculated using the CKD EPI equation. This calculation has not been validated in all clinical situations. eGFR's persistently <90 mL/min signify possible Chronic Kidney Disease.    Anion gap 10 5 - 15  CBC     Status: Abnormal   Collection Time: 02/06/14  4:40 AM  Result Value Ref Range   WBC 6.0 4.0 - 10.5 K/uL   RBC 3.91 3.87 - 5.11 MIL/uL   Hemoglobin 12.6 12.0 - 15.0 g/dL   HCT 35.2 (L) 36.0 - 46.0 %   MCV 90.0 78.0 - 100.0 fL   MCH 32.2 26.0 - 34.0 pg   MCHC 35.8 30.0 - 36.0 g/dL   RDW 12.0 11.5 - 15.5 %   Platelets 211 150 - 400 K/uL  Magnesium     Status: None   Collection Time: 02/06/14  4:40 AM  Result Value Ref Range   Magnesium 1.7 1.5 - 2.5 mg/dL  Sodium     Status: None   Collection Time: 02/06/14 11:00 AM  Result Value Ref Range   Sodium 139 135 - 145 mmol/L  Sodium     Status: None   Collection Time: 02/06/14  4:45 PM  Result Value Ref Range   Sodium 143 135 - 145 mmol/L  Sodium     Status: None   Collection Time: 02/06/14 11:00 PM  Result Value Ref Range   Sodium 143 135 - 145 mmol/L  Comprehensive metabolic panel     Status: Abnormal   Collection Time: 02/07/14  5:10 AM  Result Value Ref Range   Sodium 144 135 - 145 mmol/L   Potassium 3.0 (L) 3.5 - 5.1 mmol/L   Chloride 112 96 - 112 mmol/L   CO2 27 19 - 32 mmol/L   Glucose, Bld 101 (H) 70 - 99 mg/dL   BUN 6 6 - 23 mg/dL   Creatinine, Ser 0.72 0.50 - 1.10 mg/dL   Calcium 8.4 8.4 - 10.5 mg/dL   Total Protein 5.2 (L) 6.0 - 8.3 g/dL   Albumin 2.7 (L) 3.5 - 5.2 g/dL   AST 67 (H) 0 - 37 U/L   ALT 59 (H) 0 - 35 U/L    Alkaline Phosphatase 96 39 - 117 U/L   Total Bilirubin 0.8 0.3 - 1.2 mg/dL   GFR calc non Af Amer 80 (L) >90 mL/min   GFR calc Af Amer >90 >90 mL/min  Comment: (NOTE) The eGFR has been calculated using the CKD EPI equation. This calculation has not been validated in all clinical situations. eGFR's persistently <90 mL/min signify possible Chronic Kidney Disease.    Anion gap 5 5 - 15  CBC     Status: Abnormal   Collection Time: 02/07/14  5:10 AM  Result Value Ref Range   WBC 5.6 4.0 - 10.5 K/uL   RBC 3.43 (L) 3.87 - 5.11 MIL/uL   Hemoglobin 11.1 (L) 12.0 - 15.0 g/dL   HCT 31.2 (L) 36.0 - 46.0 %   MCV 91.0 78.0 - 100.0 fL   MCH 32.4 26.0 - 34.0 pg   MCHC 35.6 30.0 - 36.0 g/dL   RDW 12.3 11.5 - 15.5 %   Platelets 208 150 - 400 K/uL  Magnesium     Status: None   Collection Time: 02/07/14  5:10 AM  Result Value Ref Range   Magnesium 1.6 1.5 - 2.5 mg/dL  Sodium     Status: None   Collection Time: 02/07/14 11:00 AM  Result Value Ref Range   Sodium 145 135 - 145 mmol/L  Sodium     Status: Abnormal   Collection Time: 02/07/14  5:00 PM  Result Value Ref Range   Sodium 146 (H) 135 - 145 mmol/L  Glucose, capillary     Status: Abnormal   Collection Time: 02/07/14  8:07 PM  Result Value Ref Range   Glucose-Capillary 135 (H) 70 - 99 mg/dL  Sodium     Status: None   Collection Time: 02/07/14 11:00 PM  Result Value Ref Range   Sodium 145 135 - 145 mmol/L  Glucose, capillary     Status: Abnormal   Collection Time: 02/07/14 11:38 PM  Result Value Ref Range   Glucose-Capillary 116 (H) 70 - 99 mg/dL  CBC     Status: Abnormal   Collection Time: 02/08/14  4:55 AM  Result Value Ref Range   WBC 8.4 4.0 - 10.5 K/uL   RBC 3.37 (L) 3.87 - 5.11 MIL/uL   Hemoglobin 11.3 (L) 12.0 - 15.0 g/dL   HCT 31.2 (L) 36.0 - 46.0 %   MCV 92.6 78.0 - 100.0 fL   MCH 33.5 26.0 - 34.0 pg   MCHC 36.2 (H) 30.0 - 36.0 g/dL   RDW 12.2 11.5 - 15.5 %   Platelets 203 150 - 400 K/uL  Basic metabolic panel      Status: Abnormal   Collection Time: 02/08/14  4:55 AM  Result Value Ref Range   Sodium 147 (H) 135 - 145 mmol/L   Potassium 2.9 (L) 3.5 - 5.1 mmol/L   Chloride 117 (H) 96 - 112 mmol/L   CO2 23 19 - 32 mmol/L   Glucose, Bld 116 (H) 70 - 99 mg/dL   BUN 6 6 - 23 mg/dL   Creatinine, Ser 0.67 0.50 - 1.10 mg/dL   Calcium 8.1 (L) 8.4 - 10.5 mg/dL   GFR calc non Af Amer 81 (L) >90 mL/min   GFR calc Af Amer >90 >90 mL/min    Comment: (NOTE) The eGFR has been calculated using the CKD EPI equation. This calculation has not been validated in all clinical situations. eGFR's persistently <90 mL/min signify possible Chronic Kidney Disease.    Anion gap 7 5 - 15  Sodium     Status: None   Collection Time: 02/08/14 11:23 AM  Result Value Ref Range   Sodium 145 135 - 145 mmol/L  Sodium  Status: Abnormal   Collection Time: 02/08/14  5:00 PM  Result Value Ref Range   Sodium 147 (H) 135 - 145 mmol/L  Sodium     Status: None   Collection Time: 02/08/14 11:43 PM  Result Value Ref Range   Sodium 140 135 - 145 mmol/L  CBC     Status: Abnormal   Collection Time: 02/09/14  5:30 AM  Result Value Ref Range   WBC 10.1 4.0 - 10.5 K/uL   RBC 3.33 (L) 3.87 - 5.11 MIL/uL   Hemoglobin 10.8 (L) 12.0 - 15.0 g/dL   HCT 30.1 (L) 36.0 - 46.0 %   MCV 90.4 78.0 - 100.0 fL   MCH 32.4 26.0 - 34.0 pg   MCHC 35.9 30.0 - 36.0 g/dL   RDW 12.5 11.5 - 15.5 %   Platelets 191 150 - 400 K/uL  Basic metabolic panel     Status: Abnormal   Collection Time: 02/09/14  5:30 AM  Result Value Ref Range   Sodium 140 135 - 145 mmol/L   Potassium 3.0 (L) 3.5 - 5.1 mmol/L   Chloride 107 96 - 112 mmol/L   CO2 27 19 - 32 mmol/L   Glucose, Bld 116 (H) 70 - 99 mg/dL   BUN 6 6 - 23 mg/dL   Creatinine, Ser 0.66 0.50 - 1.10 mg/dL   Calcium 8.5 8.4 - 10.5 mg/dL   GFR calc non Af Amer 82 (L) >90 mL/min   GFR calc Af Amer >90 >90 mL/min    Comment: (NOTE) The eGFR has been calculated using the CKD EPI equation. This calculation  has not been validated in all clinical situations. eGFR's persistently <90 mL/min signify possible Chronic Kidney Disease.    Anion gap 6 5 - 15  Magnesium     Status: None   Collection Time: 02/09/14  5:30 AM  Result Value Ref Range   Magnesium 1.7 1.5 - 2.5 mg/dL  CBC     Status: Abnormal   Collection Time: 02/09/14  2:51 PM  Result Value Ref Range   WBC 10.3 4.0 - 10.5 K/uL   RBC 3.39 (L) 3.87 - 5.11 MIL/uL   Hemoglobin 10.9 (L) 12.0 - 15.0 g/dL   HCT 31.0 (L) 36.0 - 46.0 %   MCV 91.4 78.0 - 100.0 fL   MCH 32.2 26.0 - 34.0 pg   MCHC 35.2 30.0 - 36.0 g/dL   RDW 12.5 11.5 - 15.5 %   Platelets 190 150 - 400 K/uL  Creatinine, serum     Status: Abnormal   Collection Time: 02/09/14  2:51 PM  Result Value Ref Range   Creatinine, Ser 0.82 0.50 - 1.10 mg/dL   GFR calc non Af Amer 66 (L) >90 mL/min   GFR calc Af Amer 77 (L) >90 mL/min    Comment: (NOTE) The eGFR has been calculated using the CKD EPI equation. This calculation has not been validated in all clinical situations. eGFR's persistently <90 mL/min signify possible Chronic Kidney Disease.   CBC WITH DIFFERENTIAL     Status: Abnormal   Collection Time: 02/10/14  5:00 AM  Result Value Ref Range   WBC 9.8 4.0 - 10.5 K/uL   RBC 3.29 (L) 3.87 - 5.11 MIL/uL   Hemoglobin 10.6 (L) 12.0 - 15.0 g/dL   HCT 29.8 (L) 36.0 - 46.0 %   MCV 90.6 78.0 - 100.0 fL   MCH 32.2 26.0 - 34.0 pg   MCHC 35.6 30.0 - 36.0 g/dL   RDW  12.2 11.5 - 15.5 %   Platelets 196 150 - 400 K/uL   Neutrophils Relative % 73 43 - 77 %   Neutro Abs 7.2 1.7 - 7.7 K/uL   Lymphocytes Relative 16 12 - 46 %   Lymphs Abs 1.5 0.7 - 4.0 K/uL   Monocytes Relative 9 3 - 12 %   Monocytes Absolute 0.9 0.1 - 1.0 K/uL   Eosinophils Relative 2 0 - 5 %   Eosinophils Absolute 0.2 0.0 - 0.7 K/uL   Basophils Relative 0 0 - 1 %   Basophils Absolute 0.0 0.0 - 0.1 K/uL  Comprehensive metabolic panel     Status: Abnormal   Collection Time: 02/10/14  5:00 AM  Result Value Ref  Range   Sodium 137 135 - 145 mmol/L   Potassium 2.7 (LL) 3.5 - 5.1 mmol/L    Comment: REPEATED TO VERIFY CRITICAL RESULT CALLED TO, READ BACK BY AND VERIFIED WITH: EURILLO K,RN 02/10/14 0626 WAYK    Chloride 101 96 - 112 mmol/L   CO2 30 19 - 32 mmol/L   Glucose, Bld 101 (H) 70 - 99 mg/dL   BUN 10 6 - 23 mg/dL   Creatinine, Ser 0.70 0.50 - 1.10 mg/dL   Calcium 8.7 8.4 - 10.5 mg/dL   Total Protein 5.2 (L) 6.0 - 8.3 g/dL   Albumin 2.8 (L) 3.5 - 5.2 g/dL   AST 38 (H) 0 - 37 U/L   ALT 44 (H) 0 - 35 U/L   Alkaline Phosphatase 103 39 - 117 U/L   Total Bilirubin 0.6 0.3 - 1.2 mg/dL   GFR calc non Af Amer 80 (L) >90 mL/min   GFR calc Af Amer >90 >90 mL/min    Comment: (NOTE) The eGFR has been calculated using the CKD EPI equation. This calculation has not been validated in all clinical situations. eGFR's persistently <90 mL/min signify possible Chronic Kidney Disease.    Anion gap 6 5 - 15  Basic metabolic panel     Status: Abnormal   Collection Time: 02/11/14  5:55 AM  Result Value Ref Range   Sodium 135 135 - 145 mmol/L   Potassium 3.1 (L) 3.5 - 5.1 mmol/L   Chloride 100 96 - 112 mmol/L   CO2 30 19 - 32 mmol/L   Glucose, Bld 93 70 - 99 mg/dL   BUN 13 6 - 23 mg/dL   Creatinine, Ser 0.86 0.50 - 1.10 mg/dL   Calcium 8.5 8.4 - 10.5 mg/dL   GFR calc non Af Amer 63 (L) >90 mL/min   GFR calc Af Amer 73 (L) >90 mL/min    Comment: (NOTE) The eGFR has been calculated using the CKD EPI equation. This calculation has not been validated in all clinical situations. eGFR's persistently <90 mL/min signify possible Chronic Kidney Disease.    Anion gap 5 5 - 15  Urinalysis, Routine w reflex microscopic     Status: Abnormal   Collection Time: 02/11/14  9:25 PM  Result Value Ref Range   Color, Urine YELLOW YELLOW   APPearance CLOUDY (A) CLEAR   Specific Gravity, Urine 1.012 1.005 - 1.030   pH 6.5 5.0 - 8.0   Glucose, UA NEGATIVE NEGATIVE mg/dL   Hgb urine dipstick LARGE (A) NEGATIVE    Bilirubin Urine NEGATIVE NEGATIVE   Ketones, ur NEGATIVE NEGATIVE mg/dL   Protein, ur 30 (A) NEGATIVE mg/dL   Urobilinogen, UA 1.0 0.0 - 1.0 mg/dL   Nitrite POSITIVE (A) NEGATIVE   Leukocytes, UA  LARGE (A) NEGATIVE  Urine microscopic-add on     Status: Abnormal   Collection Time: 02/11/14  9:25 PM  Result Value Ref Range   Squamous Epithelial / LPF RARE RARE   WBC, UA TOO NUMEROUS TO COUNT <3 WBC/hpf   RBC / HPF 7-10 <3 RBC/hpf   Bacteria, UA MANY (A) RARE  Culture, Urine     Status: None   Collection Time: 02/11/14  9:26 PM  Result Value Ref Range   Specimen Description URINE, CLEAN CATCH    Special Requests NONE    Colony Count      >=100,000 COLONIES/ML Performed at [facility] Performed at [facility]    Report Status 02/14/2014 FINAL    Organism ID, Bacteria ESCHERICHIA COLI       Susceptibility   Escherichia coli - MIC*    AMPICILLIN <=2 SENSITIVE Sensitive     CEFAZOLIN <=4 SENSITIVE Sensitive     CEFTRIAXONE <=1 SENSITIVE Sensitive     CIPROFLOXACIN <=0.25 SENSITIVE Sensitive     GENTAMICIN <=1 SENSITIVE Sensitive     LEVOFLOXACIN <=0.12 SENSITIVE Sensitive     NITROFURANTOIN <=16 SENSITIVE Sensitive     TOBRAMYCIN <=1 SENSITIVE Sensitive     TRIMETH/SULFA <=20 SENSITIVE Sensitive     PIP/TAZO <=4 SENSITIVE Sensitive     * ESCHERICHIA [facility]  Basic metabolic panel     Status: Abnormal   Collection Time: 02/12/14  7:17 AM  Result Value Ref Range   Sodium 134 (L) 135 - 145 mmol/L   Potassium 3.2 (L) 3.5 - 5.1 mmol/L   Chloride 98 96 - 112 mmol/L   CO2 29 19 - 32 mmol/L   Glucose, Bld 98 70 - 99 mg/dL   BUN 10 6 - 23 mg/dL   Creatinine, Ser 0.83 0.50 - 1.10 mg/dL   Calcium 8.4 8.4 - 10.5 mg/dL   GFR calc non Af Amer 65 (L) >90 mL/min   GFR calc Af Amer 76 (L) >90 mL/min    Comment: (NOTE) The eGFR has been calculated using the CKD EPI equation. This calculation has not been validated in all clinical  situations. eGFR's persistently <90 mL/min signify possible Chronic Kidney Disease.    Anion gap 7 5 - 15  Basic metabolic panel     Status: Abnormal   Collection Time: 02/14/14  5:15 AM  Result Value Ref Range   Sodium 136 135 - 145 mmol/L   Potassium 4.6 3.5 - 5.1 mmol/L    Comment: DELTA CHECK NOTED   Chloride 103 96 - 112 mmol/L   CO2 29 19 - 32 mmol/L   Glucose, Bld 93 70 - 99 mg/dL   BUN 6 6 - 23 mg/dL   Creatinine, Ser 0.92 0.50 - 1.10 mg/dL   Calcium 8.9 8.4 - 10.5 mg/dL   GFR calc non Af Amer 58 (L) >90 mL/min   GFR calc Af Amer 67 (L) >90 mL/min    Comment: (NOTE) The eGFR has been calculated using the CKD EPI equation. This calculation has not been validated in all clinical situations. eGFR's persistently <90 mL/min signify possible Chronic Kidney Disease.    Anion gap 4 (L) 5 - 15  Basic metabolic panel     Status: Abnormal   Collection Time: 02/15/14  6:25 AM  Result Value Ref Range   Sodium 138 135 - 145 mmol/L   Potassium 3.7 3.5 - 5.1 mmol/L   Chloride  102 96 - 112 mmol/L   CO2 30 19 - 32 mmol/L   Glucose, Bld 101 (H) 70 - 99 mg/dL   BUN <5 (L) 6 - 23 mg/dL   Creatinine, Ser 0.98 0.50 - 1.10 mg/dL   Calcium 9.1 8.4 - 10.5 mg/dL   GFR calc non Af Amer 53 (L) >90 mL/min   GFR calc Af Amer 62 (L) >90 mL/min    Comment: (NOTE) The eGFR has been calculated using the CKD EPI equation. This calculation has not been validated in all clinical situations. eGFR's persistently <90 mL/min signify possible Chronic Kidney Disease.    Anion gap 6 5 - 15  Vit D  25 hydroxy (rtn osteoporosis monitoring)     Status: None   Collection Time: 03/31/14 12:02 PM  Result Value Ref Range   VITD 33.84 30.00 - 100.00 ng/mL  CBG monitoring, ED     Status: Abnormal   Collection Time: 04/06/14  1:33 PM  Result Value Ref Range   Glucose-Capillary 125 (H) 70 - 99 mg/dL  CBC with Differential/Platelet     Status: Abnormal   Collection Time: 04/06/14  3:12 PM  Result Value Ref  Range   WBC 5.8 4.0 - 10.5 K/uL   RBC 3.63 (L) 3.87 - 5.11 MIL/uL   Hemoglobin 11.8 (L) 12.0 - 15.0 g/dL   HCT 34.7 (L) 36.0 - 46.0 %   MCV 95.6 78.0 - 100.0 fL   MCH 32.5 26.0 - 34.0 pg   MCHC 34.0 30.0 - 36.0 g/dL   RDW 12.6 11.5 - 15.5 %   Platelets 237 150 - 400 K/uL   Neutrophils Relative % 83 (H) 43 - 77 %   Neutro Abs 4.8 1.7 - 7.7 K/uL   Lymphocytes Relative 10 (L) 12 - 46 %   Lymphs Abs 0.6 (L) 0.7 - 4.0 K/uL   Monocytes Relative 6 3 - 12 %   Monocytes Absolute 0.4 0.1 - 1.0 K/uL   Eosinophils Relative 1 0 - 5 %   Eosinophils Absolute 0.1 0.0 - 0.7 K/uL   Basophils Relative 0 0 - 1 %   Basophils Absolute 0.0 0.0 - 0.1 K/uL  Comprehensive metabolic panel     Status: Abnormal   Collection Time: 04/06/14  3:12 PM  Result Value Ref Range   Sodium 135 135 - 145 mmol/L   Potassium 4.1 3.5 - 5.1 mmol/L   Chloride 99 96 - 112 mmol/L   CO2 25 19 - 32 mmol/L   Glucose, Bld 114 (H) 70 - 99 mg/dL   BUN 9 6 - 23 mg/dL   Creatinine, Ser 1.04 0.50 - 1.10 mg/dL   Calcium 9.0 8.4 - 10.5 mg/dL   Total Protein 6.2 6.0 - 8.3 g/dL   Albumin 3.6 3.5 - 5.2 g/dL   AST 25 0 - 37 U/L   ALT 21 0 - 35 U/L   Alkaline Phosphatase 89 39 - 117 U/L   Total Bilirubin 0.6 0.3 - 1.2 mg/dL   GFR calc non Af Amer 50 (L) >90 mL/min   GFR calc Af Amer 58 (L) >90 mL/min    Comment: (NOTE) The eGFR has been calculated using the CKD EPI equation. This calculation has not been validated in all clinical situations. eGFR's persistently <90 mL/min signify possible Chronic Kidney Disease.    Anion gap 11 5 - 15  Troponin I     Status: None   Collection Time: 04/06/14  3:12 PM  Result Value Ref Range  Troponin I <0.03 <0.031 ng/mL    Comment:        NO INDICATION OF MYOCARDIAL INJURY.   Urinalysis, Routine w reflex microscopic     Status: None   Collection Time: 04/06/14  4:03 PM  Result Value Ref Range   Color, Urine YELLOW YELLOW   APPearance CLEAR CLEAR   Specific Gravity, Urine 1.008 1.005 -  1.030   pH 7.5 5.0 - 8.0   Glucose, UA NEGATIVE NEGATIVE mg/dL   Hgb urine dipstick NEGATIVE NEGATIVE   Bilirubin Urine NEGATIVE NEGATIVE   Ketones, ur NEGATIVE NEGATIVE mg/dL   Protein, ur NEGATIVE NEGATIVE mg/dL   Urobilinogen, UA 0.2 0.0 - 1.0 mg/dL   Nitrite NEGATIVE NEGATIVE   Leukocytes, UA NEGATIVE NEGATIVE    Comment: MICROSCOPIC NOT DONE ON URINES WITH NEGATIVE PROTEIN, BLOOD, LEUKOCYTES, NITRITE, OR GLUCOSE <1000 mg/dL.  CBC     Status: Abnormal   Collection Time: 04/07/14  6:35 AM  Result Value Ref Range   WBC 4.5 4.0 - 10.5 K/uL   RBC 3.25 (L) 3.87 - 5.11 MIL/uL   Hemoglobin 10.5 (L) 12.0 - 15.0 g/dL   HCT 31.7 (L) 36.0 - 46.0 %   MCV 97.5 78.0 - 100.0 fL   MCH 32.3 26.0 - 34.0 pg   MCHC 33.1 30.0 - 36.0 g/dL   RDW 12.7 11.5 - 15.5 %   Platelets 214 150 - 400 K/uL  Basic metabolic panel     Status: Abnormal   Collection Time: 04/07/14  6:35 AM  Result Value Ref Range   Sodium 137 135 - 145 mmol/L   Potassium 4.2 3.5 - 5.1 mmol/L   Chloride 106 96 - 112 mmol/L   CO2 24 19 - 32 mmol/L   Glucose, Bld 94 70 - 99 mg/dL   BUN 6 6 - 23 mg/dL   Creatinine, Ser 0.99 0.50 - 1.10 mg/dL   Calcium 8.5 8.4 - 10.5 mg/dL   GFR calc non Af Amer 53 (L) >90 mL/min   GFR calc Af Amer 61 (L) >90 mL/min    Comment: (NOTE) The eGFR has been calculated using the CKD EPI equation. This calculation has not been validated in all clinical situations. eGFR's persistently <90 mL/min signify possible Chronic Kidney Disease.    Anion gap 7 5 - 15    Assessment/Plan: Essential hypertension Orthostatics are negative off of Irbesartan.  BP at acceptable range today.  Asymptomatic.  Discussed importance of taking medications as directed without missed dose, which has happened previously.  Stick with manual BP measurements as it seems their home electronic BP cuff needs calibration.  Instructed to limit salt intake but stay well hydrated and nourished. Record daily BP readings and bring to  follow-up in 1 week.  Alarm signs/symptoms discussed with patient and son.   Orthostatic hypotension Resolved.  Continue compression stockings.  Remain off of Irbesartan.  Follow-up with PCP as scheduled.

## 2014-04-17 DIAGNOSIS — I951 Orthostatic hypotension: Secondary | ICD-10-CM | POA: Insufficient documentation

## 2014-04-17 NOTE — Assessment & Plan Note (Signed)
Orthostatics are negative off of Irbesartan.  BP at acceptable range today.  Asymptomatic.  Discussed importance of taking medications as directed without missed dose, which has happened previously.  Stick with manual BP measurements as it seems their home electronic BP cuff needs calibration.  Instructed to limit salt intake but stay well hydrated and nourished. Record daily BP readings and bring to follow-up in 1 week.  Alarm signs/symptoms discussed with patient and son.

## 2014-04-17 NOTE — Assessment & Plan Note (Signed)
Resolved.  Continue compression stockings.  Remain off of Irbesartan.  Follow-up with PCP as scheduled.

## 2014-04-18 NOTE — Telephone Encounter (Signed)
Notified pt's daughter and she voices understanding. Scheduled lab appt for 04/28/14 at 8:15 am. Lab order entered.

## 2014-04-27 ENCOUNTER — Telehealth: Payer: Self-pay | Admitting: *Deleted

## 2014-04-27 NOTE — Telephone Encounter (Signed)
Continue current meds- no changes, follow up on 4/25 as scheduled.

## 2014-04-27 NOTE — Telephone Encounter (Signed)
Notified nurse, Jeani Hawking and pt's daughter [name].  Pt's daughter voices understanding and states that the family has not been taking blood pressures at home because they were told not to use digital cuff and to try a manual cuff. She does not feel comfortable using manual cuff and will discuss further with PCP at f/u on 05/02/14.

## 2014-04-27 NOTE — Telephone Encounter (Signed)
Received call from Jeani Hawking, nurse with [facility] stating pt's BP today is 180/94 and pt is asymptomatic. Reports the following BP reading:  04/20/14  158/60 04/22/14  122/82  pre-exercise    172/82  post-exercise    160/50 04/25/14  160/74  Pre-exercise    140/70  Post-exercise  Pt currently taking metoprolol 25mg  twice a day for BP. Irbesartan was stopped after hospital discharge due to orthostatic hypotension.  Please advise.

## 2014-04-28 ENCOUNTER — Other Ambulatory Visit (INDEPENDENT_AMBULATORY_CARE_PROVIDER_SITE_OTHER): Payer: Medicare Other

## 2014-04-28 DIAGNOSIS — M810 Age-related osteoporosis without current pathological fracture: Secondary | ICD-10-CM

## 2014-04-29 ENCOUNTER — Telehealth: Payer: Self-pay

## 2014-04-29 LAB — PARATHYROID HORMONE, INTACT (NO CA): PTH: 29 pg/mL (ref 14–64)

## 2014-04-29 NOTE — Telephone Encounter (Signed)
Pre visit call completed with daughter.

## 2014-05-01 ENCOUNTER — Encounter: Payer: Self-pay | Admitting: Family

## 2014-05-02 ENCOUNTER — Ambulatory Visit (INDEPENDENT_AMBULATORY_CARE_PROVIDER_SITE_OTHER): Payer: Medicare Other | Admitting: Family

## 2014-05-02 ENCOUNTER — Encounter: Payer: Self-pay | Admitting: Family

## 2014-05-02 VITALS — BP 142/70 | HR 51 | Temp 97.8°F | Resp 16 | Ht 63.0 in | Wt 120.0 lb

## 2014-05-02 DIAGNOSIS — Z0000 Encounter for general adult medical examination without abnormal findings: Secondary | ICD-10-CM | POA: Diagnosis not present

## 2014-05-02 DIAGNOSIS — H9190 Unspecified hearing loss, unspecified ear: Secondary | ICD-10-CM

## 2014-05-02 DIAGNOSIS — F32A Depression, unspecified: Secondary | ICD-10-CM

## 2014-05-02 DIAGNOSIS — F329 Major depressive disorder, single episode, unspecified: Secondary | ICD-10-CM

## 2014-05-02 MED ORDER — FLUOXETINE HCL 20 MG PO TABS: 20.0000 mg | ORAL_TABLET | Freq: Every day | ORAL | Status: AC

## 2014-05-02 NOTE — Progress Notes (Signed)
Pre visit review using our clinic review tool, if applicable. No additional management support is needed unless otherwise documented below in the visit note. 

## 2014-05-02 NOTE — Progress Notes (Signed)
   Subjective:    Patient ID: [name], female    DOB: [birth year], 81 y.o.   MRN: [id number]  HPI    Review of Systems  Constitutional: Negative for unexpected weight change.  HENT: Positive for hearing loss. Negative for rhinorrhea.   Eyes: Negative for visual disturbance.  Respiratory: Negative for cough and shortness of breath.   Cardiovascular: Negative for leg swelling.  Gastrointestinal: Negative for nausea, vomiting and diarrhea.  Genitourinary: Negative for dysuria and frequency.  Musculoskeletal: Negative for myalgias and arthralgias.  Skin: Negative for rash.  Neurological: Negative for headaches.  Hematological: Negative for adenopathy.  Psychiatric/Behavioral: Negative for dysphoric mood and agitation.       Occasional anxiety.        Objective:   Physical Exam       Assessment & Plan:

## 2014-05-02 NOTE — Patient Instructions (Addendum)
Increase prozac from 10 mg to 20mg  once daily.  You will be contacted about your referral to hearing specialist.  Follow up in 1 month- please bring your blood pressure monitor to this visit.

## 2014-05-02 NOTE — Progress Notes (Signed)
Subjective:    Subjective:    [name] is a 81 y.o. female who presents for Medicare Annual/Subsequent preventive examination.  Preventive Screening-Counseling & Management  Tobacco History  Smoking status  . Former Smoker  Smokeless tobacco  . Not on file     Problems Prior to Visit 1. HTN-  On metoprolol. Denies syncope or dizziness.  2. Hyperlipidemia- on lipitor. Denies myalgia  3.  GERD-  On zantac.   4.  Preventative- due for tetanus- declines tetanus and shingles vaccine. Colo up to date.    Current Problems (verified) Patient Active Problem List   Diagnosis Date Noted  . Orthostatic hypotension 04/17/2014  . HLD (hyperlipidemia) 04/12/2014  . Intracranial vascular stenosis 04/12/2014  . Syncope and collapse 04/12/2014  . Weakness   . Vasovagal near syncope 04/06/2014  . Depression 03/31/2014  . GERD (gastroesophageal reflux disease) 03/31/2014  . Pica 02/09/2014  . 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 Malnutrition of moderate degree 02/06/2014  . Obstructive hydrocephalus   . Malnutrition   . Hypokalemia   . Occlusion of posterior inferior cerebellar artery with infarction   . Cerebellar stroke   . Nausea without vomiting   . Acute CVA (cerebrovascular accident) 01/31/2014  . Essential hypertension 01/31/2014  . Hyperlipemia   . Dementia   . Cerebral hemorrhage, nontraumatic   . History of CVA (cerebrovascular accident)   . Ureterocele, acquired     Medications Prior to Visit Current Outpatient Prescriptions on File Prior to Visit  Medication Sig Dispense Refill  . alendronate (FOSAMAX) 70 MG tablet Take 1 tablet (70 mg total) by mouth once a week. (Patient taking differently: Take 70 mg by mouth once a week. On Thursdays) 12 tablet 1  . atorvastatin (LIPITOR) 80 MG tablet Take 1 tablet (80 mg total) by mouth every morning. 90 tablet 1  . Calcium Carbonate-Vitamin D 600-400 MG-UNIT per tablet Take 1 tablet  by mouth 2 (two) times daily.    . clopidogrel (PLAVIX) 75 MG tablet Take 1 tablet (75 mg total) by mouth daily. 30 tablet 1  . FLUoxetine (PROZAC) 10 MG capsule Take 1 capsule (10 mg total) by mouth daily. 90 capsule 1  . metoprolol tartrate (LOPRESSOR) 25 MG tablet Take 1 tablet (25 mg total) by mouth 2 (two) times daily. 60 tablet 0  . Multiple Vitamin (MULTIVITAMIN WITH MINERALS) TABS Take 1 tablet by mouth daily.    . ranitidine (ZANTAC) 150 MG tablet Take 1 tablet (150 mg total) by mouth at bedtime. 30 tablet 3  . vitamin B-12 (CYANOCOBALAMIN) 1000 MCG tablet Take 1,000 mcg by mouth daily.     No current facility-administered medications on file prior to visit.    Current Medications (verified) Current Outpatient Prescriptions  Medication Sig Dispense Refill  . alendronate (FOSAMAX) 70 MG tablet Take 1 tablet (70 mg total) by mouth once a week. (Patient taking differently: Take 70 mg by mouth once a week. On Thursdays) 12 tablet 1  . atorvastatin (LIPITOR) 80 MG tablet Take 1 tablet (80 mg total) by mouth every morning. 90 tablet 1  . Calcium Carbonate-Vitamin D 600-400 MG-UNIT per tablet Take 1 tablet by mouth 2 (two) times daily.    . clopidogrel (PLAVIX) 75 MG tablet Take 1 tablet (75 mg total) by mouth daily. 30 tablet 1  . FLUoxetine (PROZAC) 10 MG capsule Take 1 capsule (10 mg total) by mouth daily. 90 capsule 1  . metoprolol tartrate (LOPRESSOR) 25 MG  tablet Take 1 tablet (25 mg total) by mouth 2 (two) times daily. 60 tablet 0  . Multiple Vitamin (MULTIVITAMIN WITH MINERALS) TABS Take 1 tablet by mouth daily.    . ranitidine (ZANTAC) 150 MG tablet Take 1 tablet (150 mg total) by mouth at bedtime. 30 tablet 3  . vitamin B-12 (CYANOCOBALAMIN) 1000 MCG tablet Take 1,000 mcg by mouth daily.     No current facility-administered medications for this visit.     Allergies (verified) Penicillins; Adhesive; and Latex   PAST HISTORY  Family History Family History  Problem Relation  Age of Onset  . Stroke Mother   . Diabetes Mother   . Hyperlipidemia Mother   . Hypertension Mother   . Stroke Father   . Heart disease Father   . Hyperlipidemia Father   . Depression Sister   . Cancer Brother 27    lung    Social History History  Substance Use Topics  . Smoking status: Former Research scientist (life sciences)  . Smokeless tobacco: Not on file  . Alcohol Use: No     Are there smokers in your home (other than you)? No  Risk Factors Current exercise habits: with PT  Dietary issues discussed:  Small appetite Wt Readings from Last 3 Encounters:  05/02/14 120 lb (54.432 kg)  04/15/14 120 lb 4 oz (54.545 kg)  04/12/14 122 lb (55.339 kg)      Cardiac risk factors: advanced age (older than 16 for men, 34 for women), dyslipidemia, hypertension and sedentary lifestyle.  Depression Screen (Note: if answer to either of the following is "Yes", a more complete depression screening is indicated)   Over the past two weeks, have you felt down, depressed or hopeless? Yes  Over the past two weeks, have you felt little interest or pleasure in doing things? No  Have you lost interest or pleasure in daily life? No  Do you often feel hopeless? Yes  Do you cry easily over simple problems? No  Activities of Daily Living In your present state of health, do you have any difficulty performing the following activities?:  Driving? Yes Managing money?  Yes Feeding yourself? Yes Getting from bed to chair? Yes. Climbing a flight of stairs? Yes Preparing food and eating?: Yes Bathing or showering? Family assists Getting dressed: No Getting to the toilet? No Using the toilet:No Moving around from place to place: uses walker In the past year have you fallen or had a near fall?:Yes   Are you sexually active?  No  Do you have more than one partner?  No  Hearing Difficulties: Yes Do you often ask people to speak up or repeat themselves? Yes Do you experience ringing or noises in your ears? In the past,  not now Do you have difficulty understanding soft or whispered voices? Yes   Do you feel that you have a problem with memory? Yes  Do you often misplace items? Yes  Do you feel safe at home?  Yes  Cognitive Testing  Alert? Yes  Normal Appearance?Yes  Oriented to person? Yes  Place? Yes   Time? No  Recall of three objects?  no  Can perform simple calculations? Yes  Displays appropriate judgment?Yes  Can read the correct time from a watch face?Yes   Advanced Directives have been discussed with the patient? Yes  List the Names of Other Physician/Practitioners you currently use: 1.  Neurology- Dr. Coralyn Helling any recent Medical Services you may have received from other than [facility] providers in the  past year (date may be approximate).  Immunization History  Administered Date(s) Administered  . Influenza-Unspecified 10/07/2013  . Pneumococcal Polysaccharide-23 02/01/2014    Screening Tests Health Maintenance  Topic Date Due  . TETANUS/TDAP  01/20/1954  . ZOSTAVAX  05/01/2024 (Originally 01/21/1995)  . INFLUENZA VACCINE  08/08/2014  . PNA vac Low Risk Adult (2 of 2 - PCV13) 02/02/2015  . COLONOSCOPY  01/07/2017  . DEXA SCAN  Completed    All answers were reviewed with the patient and necessary referrals were made:  O'SULLIVAN,Alyzza Andringa S., NP   05/02/2014   History reviewed: allergies, current medications, past family history, past medical history, past social history, past surgical history and problem list  Review of Systems see below    Objective:     Vision by Snellen chart: right eye:see nursing, left eye:see nursing  Body mass index is 21.26 kg/(m^2). BP 142/70 mmHg  Pulse 51  Temp(Src) 97.8 F (36.6 C) (Oral)  Resp 16  Ht 5\' 3"  (1.6 m)  Wt 120 lb (54.432 kg)  BMI 21.26 kg/m2  SpO2 98%      Physical Exam  Constitutional: She is oriented to person, place, and time. She appears well-developed and well-nourished. No distress.  HENT:  Head: Normocephalic and  atraumatic.  Right Ear: Tympanic membrane and ear canal normal.  Left Ear: Tympanic membrane and ear canal normal.  Mouth/Throat: Oropharynx is clear and moist.  Eyes: Pupils are equal, round, and reactive to light. No scleral icterus.  Neck: Normal range of motion. No thyromegaly present.  Cardiovascular: Normal rate and regular rhythm.   No murmur heard. Pulmonary/Chest: Effort normal and breath sounds normal. No respiratory distress. He has no wheezes. She has no rales. She exhibits no tenderness.  Abdominal: Soft. Bowel sounds are normal. He exhibits no distension and no mass. There is no tenderness. There is no rebound and no guarding.  Musculoskeletal: She exhibits no edema.  Lymphadenopathy:    She has no cervical adenopathy.  Neurological:  She exhibits normal muscle tone. Coordination normal.  Skin: Skin is warm and dry.  Psychiatric: She has a normal mood and affect. Her behavior is normal. Judgment and thought content normal.  Breasts: Examined lying Right: Without masses, retractions, discharge or axillary adenopathy.  Left: Without masses, retractions, discharge or axillary adenopathy.          Assessment & Plan:      Assessment:          Plan:     During the course of the visit the patient was educated and counseled about appropriate screening and preventive services including:    Advanced directives: pt is woring with an attorney to arrange HCPOA.     Declines tetanus, declines zostavax.  Declines colo.    Diet review for nutrition referral? Yes ____  Not Indicated _x___   Patient Instructions (the written plan) was given to the patient.  Medicare Attestation I have personally reviewed: The patient's medical and social history Their use of alcohol, tobacco or illicit drugs Their current medications and supplements The patient's functional ability including ADLs,fall risks, home safety risks, cognitive, and hearing and visual impairment Diet and  physical activities Evidence for depression or mood disorders  The patient's weight, height, BMI, and visual acuity have been recorded in the chart.  I have made referrals, counseling, and provided education to the patient based on review of the above and I have provided the patient with a written personalized care plan for preventive services.  O'SULLIVAN,Danyal Whitenack S., NP   05/02/2014

## 2014-05-03 DIAGNOSIS — Z0000 Encounter for general adult medical examination without abnormal findings: Secondary | ICD-10-CM | POA: Insufficient documentation

## 2014-05-03 DIAGNOSIS — H9190 Unspecified hearing loss, unspecified ear: Secondary | ICD-10-CM | POA: Insufficient documentation

## 2014-05-03 NOTE — Assessment & Plan Note (Signed)
Refer to audiology.

## 2014-05-03 NOTE — Assessment & Plan Note (Signed)
Deteriorated.  Increase prozac from 10 to 20mg .

## 2014-05-03 NOTE — Assessment & Plan Note (Signed)
S/p hysterectomy, no need for further paps at her age. Declines zostavax and tetanus.  Diet is good, weight is stable.  declines colo.

## 2014-05-07 ENCOUNTER — Telehealth: Payer: Self-pay | Admitting: Internal Medicine

## 2014-05-09 NOTE — Telephone Encounter (Signed)
Relation to pt: self  Call back number: [phone] Pharmacy:  Reason for call:  Pt requesting a refill metoprolol tartrate (LOPRESSOR) 25 MG tablet. Pt is completley out

## 2014-05-09 NOTE — Telephone Encounter (Signed)
Refills sent to pharmacy. Called home # and pt has difficulty hearing over the phone and requested that I relay information to her daughter. Notified Terri.

## 2014-05-10 ENCOUNTER — Other Ambulatory Visit: Payer: Self-pay | Admitting: Family

## 2014-05-13 ENCOUNTER — Telehealth: Payer: Self-pay

## 2014-05-13 NOTE — Telephone Encounter (Signed)
Patient daughter([name]) called stating patient has been having hallucinations for the past several weeks. She questions if it could be the increase in Prozac or deterioration due to her illness.  [phone]  Please advise.

## 2014-05-13 NOTE — Telephone Encounter (Signed)
It is unlikely but possible that it is the prozac. Try cutting prozac back down to 10mg  and let me know if the hallucinations improve.  Call sooner if symptoms worsen.

## 2014-05-16 NOTE — Telephone Encounter (Signed)
Daughter states that she will leave her on the new dose due to provider recommendations.  Patient has a follow up appt in June. Advised to call if symptoms worsen.

## 2014-05-17 ENCOUNTER — Encounter: Payer: Self-pay | Admitting: Family

## 2014-06-09 ENCOUNTER — Ambulatory Visit (INDEPENDENT_AMBULATORY_CARE_PROVIDER_SITE_OTHER): Payer: Medicare Other | Admitting: Neurology

## 2014-06-09 ENCOUNTER — Encounter: Payer: Self-pay | Admitting: Neurology

## 2014-06-09 VITALS — BP 153/63 | HR 47 | Wt 120.2 lb

## 2014-06-09 DIAGNOSIS — I10 Essential (primary) hypertension: Secondary | ICD-10-CM

## 2014-06-09 DIAGNOSIS — R55 Syncope and collapse: Secondary | ICD-10-CM

## 2014-06-09 DIAGNOSIS — I679 Cerebrovascular disease, unspecified: Secondary | ICD-10-CM | POA: Diagnosis not present

## 2014-06-09 DIAGNOSIS — F0391 Unspecified dementia with behavioral disturbance: Secondary | ICD-10-CM | POA: Diagnosis not present

## 2014-06-09 DIAGNOSIS — E785 Hyperlipidemia, unspecified: Secondary | ICD-10-CM

## 2014-06-09 DIAGNOSIS — F03918 Unspecified dementia, unspecified severity, with other behavioral disturbance: Secondary | ICD-10-CM

## 2014-06-09 DIAGNOSIS — I639 Cerebral infarction, unspecified: Secondary | ICD-10-CM

## 2014-06-09 NOTE — Patient Instructions (Addendum)
-   continue plavix and lipitor for stroke prevention - regarding your cognitive decline, we need to rule out treatable cause, such as depression, thyroid disease, B12 deficiency, etc - continue prozac for now for depression but discuss with PCP on Monday regarding further treatment. - discuss with PCP to check TSH, free T4, B12, RPR - your BP still high, please calibrate your BP device and discuss with PCP regarding medication management. - will order home health PT/OT to work with you further. - Follow up with your primary care physician for stroke risk factor modification. Recommend maintain blood pressure goal <140/90, diabetes with hemoglobin A1c goal below 6.5% and lipids with LDL cholesterol goal below 70 mg/dL.  - follow up in 3 months.

## 2014-06-09 NOTE — Progress Notes (Signed)
STROKE NEUROLOGY FOLLOW UP NOTE  NAME: [name] DOB: [birth year]  REASON FOR VISIT: stroke follow up HISTORY FROM: daughter and chart and pt  Today we had the pleasure of seeing [name] in follow-up at our Neurology Clinic. Pt was accompanied by daughter.   History Summary Ms. [name] is a 81 y.o. female with history of hypertension, hyperlipidemia, dementia, left punctate cerebellar stroke in 2011 and ureterocele presenting with new onset nausea and vomiting as well as dizziness and unstable gait. MRI showed left PICA infarct with petichial hemorrhagic transformation. MRA showed stenosis of left PICA and high grade stenosis of right P2. CTA head and neck also showed left VA athero and b/l ICA athero. She was put on 3% saline for hydrocephalus, repeat CT showed improving hydrocephalus. Stroke etiology not clear, large vessel athero most likely but not able to rule out cardioembolic. However, she was deemed not a good candidate for anticoagulation due to baseline dementia and fall risk. She was discharged to CIR with plavix and lipitor.  She also had punctate left cerebellar stroke in 2011, considered lacunar stroke and not sure if work up was done at that time.   04/11/14 follow up - the patient has been doing better. She was discharged from CIR after 3 weeks of extensive rehab. She is currently at home. She was admitted on 04/06/14 for likely a syncope episode. She was noted to have high BP at 177 that day earlier and then she was standing in bathroom developed diaphoresis, lightheadedness, no vertigo, she had to sat down, family found she was also confused, not answering questions. EMS called and BP was 120s. She was admitted for syncope vs. TIA. MRI done negative for acute abnormalities. EEG normal. Event was felt to be syncope. Of note, she also had one orthostatic hypotension when she worked with PT first time in neuro ICU on last admission. She was discharged in good condition  3 days ago and she is doing well since then. BP today 114/66.  Prilosec was discontinued by family since she is on plavix. She continues to have memory difficulties. She is taking Prozac for depression.   Interval History During the interval time, the patient had slight decline as per family. She had finished home PT OT, however, as per daughter, patient walking become more unsteady, and she refused to use walker most of time. Daughter also felt patient was more depressed and more memory deficit. Patient also had sometimes hallucination and night and become confused and combative. Daughter brought over blood pressure record, it shows high blood pressure at home ranging from 180-200. She is on metoprolol 25 mg twice a day, with blood pressure in clinic today 153/63 and pulse rate 47.  REVIEW OF SYSTEMS: Full 14 system review of systems performed and notable only for those listed below and in HPI above, all others are negative:  Constitutional:  Activity change, appetite change, fatigue Cardiovascular:  Ear/Nose/Throat:  Hearing loss Skin: Moles Eyes:  Respiratory:   Gastroitestinal:  constipation Genitourinary: incontinence of bladder Hematology/Lymphatic:   Endocrine:  Musculoskeletal:  Back pain, walking difficulty Allergy/Immunology:   Neurological:  Memory loss Psychiatric: Behavior problem, confusion, decreased concentration, depression, hallucination Sleep: snoring, daytime sleepiness, sleep talking, acting out dreams  The following represents the patient's updated allergies and side effects list: Allergies  Allergen Reactions  . Penicillins     Reaction: unknown  . Adhesive [Tape] Rash  . Latex Rash    The neurologically relevant items on the patient's  problem list were reviewed on today's visit.  Neurologic Examination  A problem focused neurological exam (12 or more points of the single system neurologic examination, vital signs counts as 1 point, cranial nerves count for  8 points) was performed.  Blood pressure 153/63, pulse 47, weight 120 lb 3.2 oz (54.522 kg).  General - Well nourished, well developed, in no apparent distress.  Ophthalmologic - fundi not visualized due to small pupils.  Cardiovascular - Regular rate and rhythm.  Mental Status -  Level of arousal and orientation to time, place, and person were intact. Language including expression, naming, repetition, comprehension was assessed and found intact.  mini-mental status exam  Orientation to time - 3/5 Orientation to place - 2/5 Registration - 3/3 Attention - 5/5 Delayed recall - 2/3 Naming - 2/2 Repetition - 1/1 Comprehension - 3/3 Reading - 1/1 Writing - 1/1 Visuospatial - 0/1  Total 23/30  Cranial Nerves II - XII - II - Visual field intact OU. III, IV, VI - Extraocular movements intact. V - Facial sensation intact bilaterally. VII - Facial movement intact bilaterally. VIII - Hearing & vestibular intact bilaterally. X - Palate elevates symmetrically. XI - Chin turning & shoulder shrug intact bilaterally. XII - Tongue protrusion intact.  Motor Strength - The patient's strength was 4+/5 in all extremities and pronator drift was absent.  Bulk was normal and fasciculations were absent.   Motor Tone - Muscle tone was assessed at the neck and appendages and was normal.  Reflexes - The patient's reflexes were 1+ in all extremities and she had no pathological reflexes.  Sensory - Light touch, temperature/pinprick were assessed and were normal.    Coordination - The patient had normal movements in the hands and feet with no ataxia or dysmetria.  Tremor was absent.  Gait and Station - slow, cautious gait with mild stooped posturing, unsteady with turns.  Data reviewed: I personally reviewed the images and agree with the radiology interpretations.  Ct Angio Head and neck W/cm &/or Wo Cm  02/03/2014 IMPRESSION: Acute infarct in the left inferior cerebellum in the PICA  territory shows progressive edema and swelling with mass effect on the fourth ventricle. No obstructive hydrocephalus or hemorrhage identified. Left PICA is now patent. Both vertebral arteries are widely patent. Bilateral carotid atherosclerotic disease. Ulcerated plaque involving the right common carotid artery. No significant internal carotid artery stenosis. Moderately severe stenosis proximal right external carotid artery. 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 Electronically Signed By: Franchot Gallo M.D. On: 02/03/2014 07:34   Dg Chest 2 View  01/31/2014 IMPRESSION: No acute cardiopulmonary disease. Lower lung volumes than on prior with basilar atelectasis.  Ct Head Wo Contrast  02/07/14 - 1. Confluent left PICA territory with petechial hemorrhage, edema, and posterior fossa mass effect. Stable partially effaced basilar cisterns. 2. Interval mild regression of the acute ventriculomegaly, suggesting partial resolution of the fourth ventricle mass effect. 3. No new intracranial abnormality identified.  02/05/2014 IMPRESSION: Evolving LEFT posterior inferior cerebellar artery territory infarct with worsening edema, progressive fourth ventricle and cerebral aqueduct effacement resulting in early obstructive hydrocephalus. Worsening sphenoid sinusitis.   02/02/2014 IMPRESSION: Evolving LEFT posterior inferior cerebellar artery territory infarct with worsening edema, partial effacement of the fourth ventricle without obstructive hydrocephalus. No hemorrhagic conversion.   01/31/2014 IMPRESSION: Subacute nonhemorrhagic infarction involving a large portion of the left cerebellar hemisphere inferiorly as well as a small portion of the right cerebellar hemisphere inferomedially. Minimal mass effect on the fourth ventricle. Moderate atrophy  and microvascular disease in the cerebral hemispheres. Sphenoid sinus disease bilaterally.   Mri and Mra Brain Wo  Contrast  02/01/2014 IMPRESSION: MRI HEAD: Acute LEFT posterior inferior cerebellar artery territory infarct with petechial hemorrhage. Scattered foci of susceptibility artifact in a pattern suggesting sequelae of chronic hypertension. Acute sphenoid sinusitis. MRA HEAD: Thready irregular LEFT posterior-inferior cerebellar artery, this could reflect atherosclerosis or mass effect from acute infarct. Mid grade stenosis of RIGHT P2 segment with luminal irregularity of the bilateral posterior cerebral arteries consistent with atherosclerosis.   MRI brain 04/07/14 No acute intracranial process, specifically no acute ischemia. Subacute to chronic LEFT posterior inferior cerebellar artery territory infarct with sequelae of petechial hemorrhage/ mineralization. Similar scattered susceptibility artifact suggesting sequelae of chronic hypertension. White matter changes suggest mild chronic small vessel ischemic disease. Worsening mild to moderate sphenoid sinusitis.  Carotid Doppler There is 1-39% bilateral ICA stenosis. Vertebral artery flow is antegrade.   2D echo - Left ventricle: The cavity size was normal. Wall thickness was normal. Systolic function was normal. The estimated ejection fraction was in the range of 55% to 60%. Wall motion was normal; there were no regional wall motion abnormalities. Doppler parameters are consistent with abnormal left ventricular relaxation (grade 1 diastolic dysfunction).  Impressions: - Normal LV function; grade 1 diastolic dysfunction; no significant valvular regurgitation noted.  EEG 04/08/14 - This is a normal awake and drowsy electroencephalogram. No  epileptiform activity is noted.   Component     Latest Ref Rng 02/01/2014  Cholesterol     0 - 200 mg/dL 126  Triglycerides     <150 mg/dL 46  HDL     >39 mg/dL 46  Total CHOL/HDL Ratio      2.7  VLDL     0 - 40 mg/dL 9  LDL (calc)     0 - 99 mg/dL 71  Hemoglobin A1C      <5.7 % 5.6  Mean Plasma Glucose     <117 mg/dL 114    Assessment: As you may recall, she is a 81 y.o. Caucasian female with PMH of hypertension, hyperlipidemia, dementia, left punctate cerebellar stroke in 2011 was admitted on 01/31/14 due to left PICA cerebellar infarcts with petechial hemorrhagic transformation. Mild hydrocephalus during admission but resolved without intervention. MRA and CTA showed multivessel intracranial stenosis. She was put on plavix and lipitor for 3 months. She recovered well and d/c from CIR. She had one syncope episode in March 2016 and was admitted. MRI and EEG negative. Of note, she also had one near syncope at his first PT session during last admission. She was asked to take ASA and plavix for stroke prevention by Dr. Doy Mince. Family stopped prilosec due to potential interaction with plavix.   During the interval time, the family reported worsening gait, memory deficit, and depression with hallucination and confusion at night. Also blood pressure at home. Patient may has dementia with sundowning, however need to rule out treatable causes especially depression treatment. Patient had PCP visit next Monday.  Plan:  - continue plavix and lipitor for stroke prevention - need to rule out treatable cause for dementia, recommend PCP to check TSH, free T4, B12, RPR. - continue prozac for now for depression but patient is to discuss with PCP on Monday regarding further treatment. - BP still high, need to calibrate home BP device and discuss with PCP regarding medication management. - order home health PT/OT again - Follow up with your primary care physician for stroke risk factor modification.  Recommend maintain blood pressure goal <140/90, diabetes with hemoglobin A1c goal below 6.5% and lipids with LDL cholesterol goal below 70 mg/dL.  - RTC in 3 months.  Orders Placed This Encounter  Procedures  . Ambulatory referral to Home Health    Referral Priority:  Routine     Referral Type:  Home Health Care    Referral Reason:  Specialty Services Required    Requested Specialty:  Halfway    Number of Visits Requested:  1    No orders of the defined types were placed in this encounter.    Patient Instructions  - continue plavix and lipitor for stroke prevention - regarding your cognitive decline, we need to rule out treatable cause, such as depression, thyroid disease, B12 deficiency, etc - continue prozac for now for depression but discuss with PCP on Monday regarding further treatment. - discuss with PCP to check TSH, free T4, B12, RPR - your BP still high, please calibrate your BP device and discuss with PCP regarding medication management. - will order home health PT/OT to work with you further. - Follow up with your primary care physician for stroke risk factor modification. Recommend maintain blood pressure goal <140/90, diabetes with hemoglobin A1c goal below 6.5% and lipids with LDL cholesterol goal below 70 mg/dL.  - follow up in 3 months.    Rosalin Hawking, MD PhD [facility] [address], [place], [place] [address] [phone]

## 2014-06-13 ENCOUNTER — Ambulatory Visit (INDEPENDENT_AMBULATORY_CARE_PROVIDER_SITE_OTHER): Payer: Medicare Other | Admitting: Family

## 2014-06-13 ENCOUNTER — Encounter: Payer: Self-pay | Admitting: Family

## 2014-06-13 VITALS — BP 156/68 | HR 48 | Temp 97.6°F | Resp 16 | Ht 63.0 in | Wt 119.6 lb

## 2014-06-13 DIAGNOSIS — F329 Major depressive disorder, single episode, unspecified: Secondary | ICD-10-CM

## 2014-06-13 DIAGNOSIS — I10 Essential (primary) hypertension: Secondary | ICD-10-CM

## 2014-06-13 DIAGNOSIS — R269 Unspecified abnormalities of gait and mobility: Secondary | ICD-10-CM

## 2014-06-13 DIAGNOSIS — F32A Depression, unspecified: Secondary | ICD-10-CM

## 2014-06-13 MED ORDER — METOPROLOL SUCCINATE ER 25 MG PO TB24: 25.0000 mg | ORAL_TABLET | Freq: Every day | ORAL | Status: DC

## 2014-06-13 MED ORDER — LISINOPRIL 10 MG PO TABS: 10.0000 mg | ORAL_TABLET | Freq: Every day | ORAL | Status: DC

## 2014-06-13 NOTE — Progress Notes (Signed)
Pre visit review using our clinic review tool, if applicable. No additional management support is needed unless otherwise documented below in the visit note. 

## 2014-06-13 NOTE — Progress Notes (Signed)
Subjective:    Patient ID: [name], female    DOB: 1933/07/20, 81 y.o.   MRN: [id number]  HPI  [name] is a 81 yr old female who presents today for follow up.  1) depression- last visit her depression symptoms had deteriorated and her prozac was increased from 10mg  to 20mg . [name] notes that the patient has a lot of situational stress- being separated from her husband, leaving her home.  Mood is only marginally better.  2)  Fall- pt reports 5 falls since 05/27/14.  Saw neuro on 6/2 and office visit is reviewed.  Some of the falls have occured with the walker.  On 5/24 she became nauseated around lunch time had some dizziness associated with it.  [name] notes that the patient does not always keep both hands on walker and tries to carry objects with her when she is walking.   3) HTN- Patient is maintained on metoprolol 25mg  bid. BP was elevated at neuro apt recently.  BP Readings from Last 3 Encounters:  06/13/14 156/68  06/09/14 153/63  05/02/14 142/70     Review of Systems See HPI  Past Medical History  Diagnosis Date  . Hypertension   . Hyperlipemia   . Dementia   . Cerebral hemorrhage, nontraumatic 2011  . CVA (cerebral infarction) 2011    left cerebellum  . Ureterocele, acquired   . Broken arm     left x 3  . Brain swelling 2016  . History of pubovaginal sling   . Depression   . Chronic kidney disease     stage 3  . Stroke 2015 & 2016    Dec; Jan, blockage cerebellum  . Memory loss   . Confusion   . Falls frequently   . Hearing loss     History   Social History  . Marital Status: Married    Spouse Name: [name]  . Number of Children: 3  . Years of Education: 6th   Occupational History  . Retired    Social History Main Topics  . Smoking status: Former Research scientist (life sciences)  . Smokeless tobacco: Not on file  . Alcohol Use: No  . Drug Use: No  . Sexual Activity: No   Other Topics Concern  . Not on file   Social History Narrative   Former Passenger transport manager   Completed 6th grade   Married   2 sons and 1 [name] ( lives with [name])   No hobbies    Past Surgical History  Procedure Laterality Date  . Abdominal hysterectomy    . Cholecystectomy    . Fracture surgery Left     3 fractures in past  . Other surgical history  2006    "bladder tac"    Family History  Problem Relation Age of Onset  . Stroke Mother   . Diabetes Mother   . Hyperlipidemia Mother   . Hypertension Mother   . Stroke Father   . Heart disease Father   . Hyperlipidemia Father   . Depression Sister   . Cancer Brother 40    lung    Allergies  Allergen Reactions  . Penicillins     Reaction: unknown  . Adhesive [Tape] Rash  . Latex Rash    Current Outpatient Prescriptions on File Prior to Visit  Medication Sig Dispense Refill  . alendronate (FOSAMAX) 70 MG tablet Take 1 tablet (70 mg total) by mouth once a week. (Patient taking differently: Take 70 mg by mouth once a  week. On Thursdays) 12 tablet 1  . atorvastatin (LIPITOR) 80 MG tablet Take 1 tablet (80 mg total) by mouth every morning. 90 tablet 1  . Calcium Carbonate-Vitamin D 600-400 MG-UNIT per tablet Take 1 tablet by mouth 2 (two) times daily.    . clopidogrel (PLAVIX) 75 MG tablet Take 1 tablet (75 mg total) by mouth daily. 30 tablet 1  . FLUoxetine (PROZAC) 20 MG tablet Take 1 tablet (20 mg total) by mouth daily. 30 tablet 3  . metoprolol tartrate (LOPRESSOR) 25 MG tablet TAKE 1 TABLET (25 MG TOTAL) BY MOUTH 2 (TWO) TIMES DAILY. 180 tablet 1  . Multiple Vitamin (MULTIVITAMIN WITH MINERALS) TABS Take 1 tablet by mouth daily.    . ranitidine (ZANTAC) 150 MG tablet Take 1 tablet (150 mg total) by mouth at bedtime. 30 tablet 3  . vitamin B-12 (CYANOCOBALAMIN) 1000 MCG tablet Take 1,000 mcg by mouth daily.     No current facility-administered medications on file prior to visit.    BP 140/60 mmHg  Pulse 48  Temp(Src) 97.6 F (36.4 C) (Oral)  Resp 16  Ht 5\' 3"  (1.6 m)  Wt 119 lb 9.6 oz  (54.25 kg)  BMI 21.19 kg/m2  SpO2 99%       Objective:   Physical Exam  Constitutional: She appears well-developed and well-nourished.  HENT:  Head: Normocephalic and atraumatic.  Cardiovascular: Regular rhythm and normal heart sounds.  Bradycardia present.   Pulmonary/Chest: Effort normal and breath sounds normal. No respiratory distress. She has no wheezes. She has no rales.  Musculoskeletal: She exhibits no edema.  Neurological: She is alert.  Skin: Skin is warm and dry.  Psychiatric: She has a normal mood and affect. Her behavior is normal. Judgment and thought content normal.          Assessment & Plan:

## 2014-06-13 NOTE — Patient Instructions (Addendum)
Stop metoprolol 25mg  twice daily and switch to toprol xl 25mg  once daily. Add lisinopril 10mg  once daily. Check blood pressure once daily at home and call if blood pressure is >180/100.  Follow up in 2 weeks so we can recheck your blood pressure.

## 2014-06-16 ENCOUNTER — Telehealth: Payer: Self-pay | Admitting: Family

## 2014-06-16 DIAGNOSIS — R269 Unspecified abnormalities of gait and mobility: Secondary | ICD-10-CM | POA: Insufficient documentation

## 2014-06-16 NOTE — Telephone Encounter (Signed)
Caller name: Ammie Ferrier Relationship to patient: daughter Can be reached: [phone] Pharmacy:  Reason for call: Pt began taking new med for blood pressure 06/15/14. They also got a new BP meter. This morning pt took med at 7:15 am. At 7:25am BP reading was 198/73. She doesn't want to be alarmed but was told to notify the office if top number was over 180. Please contact [name].

## 2014-06-16 NOTE — Assessment & Plan Note (Signed)
Pt is significantly bradycardic on current dose of metoprolol and BP is moderately elevated. I am concerned that bradycardia could bee contributing to report of dizziness and falls. Advised pt and daughter:  Stop metoprolol 25mg  twice daily and switch to toprol xl 25mg  once daily. Add lisinopril 10mg  once daily. Check blood pressure once daily at home and call if blood pressure is >180/100.  Follow up in 2 weeks so we can recheck your blood pressure.

## 2014-06-16 NOTE — Assessment & Plan Note (Signed)
She is to begin another round of PT. Advised pt to always keep 2 hands on walker at all times and always to use walker to avoid falls.  Also discussed purchasing a tray or a basket to hang on walker to assist pt in transporting objects.

## 2014-06-16 NOTE — Assessment & Plan Note (Signed)
Fair control.  I think that referral to a therapist would be most helpful. I have given patient and daughter [employer] health pamphlet and encouraged them to get established with one of our therapists.

## 2014-06-16 NOTE — Telephone Encounter (Signed)
Update - Daughter called in and said bp at 12:30pm was 139/77

## 2014-06-17 NOTE — Telephone Encounter (Signed)
Lets have her change lisinopril from 10mg  once daily to 10mg  twice daily please. Continue to monitor BP. Keep upcoming apt.

## 2014-06-17 NOTE — Telephone Encounter (Signed)
Spoke with pt's daughter. She states pt's BP this morning was 178/82. Notes that her morning readings is when she is getting ready to take her daily dose and wonders if it is high at that time because it has worn off? Please advise.

## 2014-06-17 NOTE — Telephone Encounter (Signed)
Notified pt's daughter and she voices understanding. She reports that last night when she came home from work that pt had "had an accident in the bathroom and did not clean it up". She reports this is unusual for her mom and she has very neat, clean and tidy habits. Also reports that pt's caregiver told her that pt had trouble completing her sentences earlier today. States pt would start the sentence then pause and complete the sentence but it did not make sense. "like she was combining 2 sentences together".  Advised her that pt should be evaluated in the ER. Confirmed with PCP and re-iterated to pt's daughter.  She voices understanding.

## 2014-06-17 NOTE — Telephone Encounter (Signed)
BP looks better today. Advise daughter to continue to monitor  And keep [place] posted if SBP >180.

## 2014-06-17 NOTE — Telephone Encounter (Signed)
Daughter checking on the status of message below. Please advise

## 2014-06-27 ENCOUNTER — Encounter: Payer: Self-pay | Admitting: Family

## 2014-06-27 ENCOUNTER — Ambulatory Visit (INDEPENDENT_AMBULATORY_CARE_PROVIDER_SITE_OTHER): Payer: Medicare Other | Admitting: Family

## 2014-06-27 VITALS — BP 130/70 | HR 55 | Temp 98.1°F | Resp 16 | Ht 63.0 in | Wt 117.8 lb

## 2014-06-27 DIAGNOSIS — I63442 Cerebral infarction due to embolism of left cerebellar artery: Secondary | ICD-10-CM

## 2014-06-27 DIAGNOSIS — R634 Abnormal weight loss: Secondary | ICD-10-CM

## 2014-06-27 DIAGNOSIS — G459 Transient cerebral ischemic attack, unspecified: Secondary | ICD-10-CM | POA: Diagnosis not present

## 2014-06-27 DIAGNOSIS — I10 Essential (primary) hypertension: Secondary | ICD-10-CM | POA: Diagnosis not present

## 2014-06-27 MED ORDER — LISINOPRIL 10 MG PO TABS
10.0000 mg | ORAL_TABLET | Freq: Two times a day (BID) | ORAL | Status: AC
Start: 2014-06-27 — End: ?

## 2014-06-27 NOTE — Progress Notes (Signed)
Subjective:    Patient ID: [name], female    DOB: 01/08/1934, 81 y.o.   MRN: [id number]  HPI  [name] is a 81 yr old female who presents today for follow up of her blood pressure. She is brought today by her son in law who assists with history. Last visit she was bradycardic with elevated blood pressure.  Toprol was changed from 25mg  bid to toprol xl 54m once daily.  Lisinopril was added.   BP Readings from Last 3 Encounters:  06/27/14 130/70  06/13/14 156/68  06/09/14 153/63     Daughter called on 6/9 and noted that patient was having some behavioral changes and trouble with her sentencees.  She was advised to take pt to the ED.  Per son-in-law, they chose not to take the patient to the ED.  He reports that she is now having less confusion. Mood better since last visit. Family did not take her to the ED.  Son-in-law notes 2-3 events in the last week with temporary confusion. She reports overall poor appetite.  Overall, he reports that the family has noted a decline in her functional status/memory in the last month or so.   Wt Readings from Last 3 Encounters:  06/27/14 117 lb 12.8 oz (53.434 kg)  06/13/14 119 lb 9.6 oz (54.25 kg)  06/09/14 120 lb 3.2 oz (54.522 kg)    Review of Systems See HPI  Past Medical History  Diagnosis Date  . Hypertension   . Hyperlipemia   . Dementia   . Cerebral hemorrhage, nontraumatic 2011  . CVA (cerebral infarction) 2011    left cerebellum  . Ureterocele, acquired   . Broken arm     left x 3  . Brain swelling 2016  . History of pubovaginal sling   . Depression   . Chronic kidney disease     stage 3  . Stroke 2015 & 2016    Dec; Jan, blockage cerebellum  . Memory loss   . Confusion   . Falls frequently   . Hearing loss     History   Social History  . Marital Status: Married    Spouse Name: Trilby Drummer  . Number of Children: 3  . Years of Education: 6th   Occupational History  . Retired    Social History Main Topics  .  Smoking status: Former Research scientist (life sciences)  . Smokeless tobacco: Not on file  . Alcohol Use: No  . Drug Use: No  . Sexual Activity: No   Other Topics Concern  . Not on file   Social History Narrative   Former Nurse, children's   Completed 6th grade   Married   2 sons and 1 daughter ( lives with daughter)   No hobbies    Past Surgical History  Procedure Laterality Date  . Abdominal hysterectomy    . Cholecystectomy    . Fracture surgery Left     3 fractures in past  . Other surgical history  2006    "bladder tac"    Family History  Problem Relation Age of Onset  . Stroke Mother   . Diabetes Mother   . Hyperlipidemia Mother   . Hypertension Mother   . Stroke Father   . Heart disease Father   . Hyperlipidemia Father   . Depression Sister   . Cancer Brother 40    lung    Allergies  Allergen Reactions  . Penicillins     Reaction: unknown  . Adhesive [Tape]  Rash  . Latex Rash    Current Outpatient Prescriptions on File Prior to Visit  Medication Sig Dispense Refill  . alendronate (FOSAMAX) 70 MG tablet Take 1 tablet (70 mg total) by mouth once a week. (Patient taking differently: Take 70 mg by mouth once a week. On Thursdays) 12 tablet 1  . atorvastatin (LIPITOR) 80 MG tablet Take 1 tablet (80 mg total) by mouth every morning. 90 tablet 1  . Calcium Carbonate-Vitamin D 600-400 MG-UNIT per tablet Take 1 tablet by mouth 2 (two) times daily.    . clopidogrel (PLAVIX) 75 MG tablet Take 1 tablet (75 mg total) by mouth daily. 30 tablet 1  . FLUoxetine (PROZAC) 20 MG tablet Take 1 tablet (20 mg total) by mouth daily. 30 tablet 3  . lisinopril (PRINIVIL,ZESTRIL) 10 MG tablet Take 1 tablet (10 mg total) by mouth daily. (Patient taking differently: Take 10 mg by mouth 2 (two) times daily. ) 30 tablet 2  . metoprolol succinate (TOPROL-XL) 25 MG 24 hr tablet Take 1 tablet (25 mg total) by mouth daily. 30 tablet 2  . Multiple Vitamin (MULTIVITAMIN WITH MINERALS) TABS Take 1 tablet by mouth  daily.    . ranitidine (ZANTAC) 150 MG tablet Take 1 tablet (150 mg total) by mouth at bedtime. 30 tablet 3  . vitamin B-12 (CYANOCOBALAMIN) 1000 MCG tablet Take 1,000 mcg by mouth daily.     No current facility-administered medications on file prior to visit.    BP 130/70 mmHg  Pulse 55  Temp(Src) 98.1 F (36.7 C) (Oral)  Resp 16  Ht 5\' 3"  (1.6 m)  Wt 117 lb 12.8 oz (53.434 kg)  BMI 20.87 kg/m2  SpO2 98%      Objective:   Physical Exam  Constitutional: She is oriented to person, place, and time. She appears well-developed and well-nourished.  Cardiovascular: Normal rate, regular rhythm and normal heart sounds.   No murmur heard. Pulmonary/Chest: Effort normal and breath sounds normal. No respiratory distress. She has no wheezes.  Neurological: She is alert and oriented to person, place, and time. She has normal strength. She exhibits normal muscle tone.  Some trouble with short term memory noted.  Speech is clear, no facial drooping   Skin: Skin is warm and dry.  Psychiatric: She has a normal mood and affect. Her behavior is normal. Judgment and thought content normal.          Assessment & Plan:

## 2014-06-27 NOTE — Patient Instructions (Signed)
Weigh patient at least once a week and call if further weight loss. Add ensure one can twice daily. You will be contacted about your referral for CT scan of your head. Please contact neurology to arrange follow up. Follow up in 6 weeks.

## 2014-06-27 NOTE — Progress Notes (Signed)
Pre visit review using our clinic review tool, if applicable. No additional management support is needed unless otherwise documented below in the visit note. 

## 2014-06-28 ENCOUNTER — Ambulatory Visit (HOSPITAL_BASED_OUTPATIENT_CLINIC_OR_DEPARTMENT_OTHER)
Admission: RE | Admit: 2014-06-28 | Discharge: 2014-06-28 | Disposition: A | Discharge status: Home / Self Care | Payer: Medicare Other | Source: Ambulatory Visit | Attending: Family | Admitting: Family

## 2014-06-28 DIAGNOSIS — E854 Organ-limited amyloidosis: Secondary | ICD-10-CM | POA: Diagnosis present

## 2014-06-28 DIAGNOSIS — Z90710 Acquired absence of both cervix and uterus: Secondary | ICD-10-CM

## 2014-06-28 DIAGNOSIS — I129 Hypertensive chronic kidney disease with stage 1 through stage 4 chronic kidney disease, or unspecified chronic kidney disease: Secondary | ICD-10-CM | POA: Diagnosis present

## 2014-06-28 DIAGNOSIS — G936 Cerebral edema: Secondary | ICD-10-CM | POA: Diagnosis present

## 2014-06-28 DIAGNOSIS — Z8673 Personal history of transient ischemic attack (TIA), and cerebral infarction without residual deficits: Secondary | ICD-10-CM

## 2014-06-28 DIAGNOSIS — H9190 Unspecified hearing loss, unspecified ear: Secondary | ICD-10-CM | POA: Diagnosis present

## 2014-06-28 DIAGNOSIS — F329 Major depressive disorder, single episode, unspecified: Secondary | ICD-10-CM | POA: Diagnosis present

## 2014-06-28 DIAGNOSIS — Z9049 Acquired absence of other specified parts of digestive tract: Secondary | ICD-10-CM | POA: Diagnosis present

## 2014-06-28 DIAGNOSIS — F0390 Unspecified dementia without behavioral disturbance: Secondary | ICD-10-CM | POA: Diagnosis present

## 2014-06-28 DIAGNOSIS — Z880 Allergy status to penicillin: Secondary | ICD-10-CM

## 2014-06-28 DIAGNOSIS — N183 Chronic kidney disease, stage 3 (moderate): Secondary | ICD-10-CM | POA: Diagnosis present

## 2014-06-28 DIAGNOSIS — G459 Transient cerebral ischemic attack, unspecified: Secondary | ICD-10-CM

## 2014-06-28 DIAGNOSIS — R4182 Altered mental status, unspecified: Secondary | ICD-10-CM | POA: Diagnosis not present

## 2014-06-28 DIAGNOSIS — I680 Cerebral amyloid angiopathy: Secondary | ICD-10-CM | POA: Diagnosis present

## 2014-06-28 DIAGNOSIS — Z87891 Personal history of nicotine dependence: Secondary | ICD-10-CM

## 2014-06-28 DIAGNOSIS — Z7902 Long term (current) use of antithrombotics/antiplatelets: Secondary | ICD-10-CM

## 2014-06-28 DIAGNOSIS — I6202 Nontraumatic subacute subdural hemorrhage: Secondary | ICD-10-CM | POA: Diagnosis not present

## 2014-06-28 DIAGNOSIS — Z86718 Personal history of other venous thrombosis and embolism: Secondary | ICD-10-CM

## 2014-06-28 DIAGNOSIS — Z91048 Other nonmedicinal substance allergy status: Secondary | ICD-10-CM

## 2014-06-28 DIAGNOSIS — Z91040 Latex allergy status: Secondary | ICD-10-CM

## 2014-06-28 DIAGNOSIS — Z66 Do not resuscitate: Secondary | ICD-10-CM | POA: Diagnosis present

## 2014-06-28 DIAGNOSIS — E785 Hyperlipidemia, unspecified: Secondary | ICD-10-CM | POA: Diagnosis present

## 2014-06-29 ENCOUNTER — Telehealth: Payer: Self-pay | Admitting: Family

## 2014-06-29 ENCOUNTER — Telehealth: Payer: Self-pay | Admitting: Neurology

## 2014-06-29 DIAGNOSIS — I639 Cerebral infarction, unspecified: Secondary | ICD-10-CM | POA: Insufficient documentation

## 2014-06-29 DIAGNOSIS — I10 Essential (primary) hypertension: Secondary | ICD-10-CM

## 2014-06-29 DIAGNOSIS — R634 Abnormal weight loss: Secondary | ICD-10-CM | POA: Insufficient documentation

## 2014-06-29 NOTE — Telephone Encounter (Signed)
Returned call from NP, [employer]. She states she and Dr Erlinda Hong have mutual patient, and she feels patient may have had stroke per ct scan. Informed her Dr Erlinda Hong is out of country. Gave her Dr Clydene Fake contact number.

## 2014-06-29 NOTE — Telephone Encounter (Signed)
CT scan clearly shows a subacute left temporal  MCA branch infarct which is likely embolic and is new compared with previous MRI from March 2016. This patient has previously had 2 other strokes and has a high likelihood of having atrial fibrillation. Given her baseline dementia I'm not sure if she is a good anticoagulation candidate hence we'll defer doing TEE and loop recorder at the present time. Agree with checking an MRI scan and referring patient to see Dr. Erlinda Hong back after his return

## 2014-06-29 NOTE — Telephone Encounter (Addendum)
She will also need a bmet dx htn, however this is not urgent can be done in the next two weeks.  Please confirm how she is taking her lisinopril.  Rx says bid, but it was my intention for her to take once daily only.  However, since Bp looks good, she should continue however she is doing it.

## 2014-06-29 NOTE — Telephone Encounter (Signed)
Dr. Leonie Man- This is the patient we discussed.  Please see CT.  I am placing MRI order. Feel free to contact my cell if you have any questions or further recommendations:  [phone].  Thanks

## 2014-06-29 NOTE — Telephone Encounter (Signed)
Could you please contact Dr. Phoebe Sharps office [facility]) and arrange follow up with him and notify daughter?

## 2014-06-29 NOTE — Assessment & Plan Note (Addendum)
CT is performed due to patients recent "TIA" like symptoms reported by the family. Results reviewed with neurology. CT confirms CVA.  See phone note per Dr. Leonie Man, likely embolic but unlikely to be a good coumadin candidate.  He recommended MRI and follow up with Dr. Erlinda Hong when he returns (he is out of the office).

## 2014-06-29 NOTE — Telephone Encounter (Signed)
Contact [facility] to speak with Dr. Erlinda Hong re: pt's CT scan results.  I was told by staff that he is in the hospital and they do not have pager number for him.  Receptionist was unable to find RN. She told me she would route RN a note and RN would know how to get in touch with him.  Spoke with pt's daughter and let her know results with possible new stroke findings. Advised pt that I would place MRI order for further evaluation.  Also advised her that if she has recurrent confusion or mental status changes/stroke symptoms to bring her directly to the ED. She verbalizes understanding.

## 2014-06-29 NOTE — Assessment & Plan Note (Signed)
May be due to dementia.  We discussed adding can of ensure twice daily.  Monitor weight. We also discussed that if she has progressive weight loss, we may need to consider additional work up to rule out malignancy.

## 2014-06-29 NOTE — Assessment & Plan Note (Signed)
BP and heart rate are improved. Continue current dose of toprl xl and lisinopril 10mg .  Will arrange a follow up bmet (see phone note) 6/22.

## 2014-06-30 ENCOUNTER — Encounter (HOSPITAL_COMMUNITY): Payer: Self-pay | Admitting: Emergency Medicine

## 2014-06-30 ENCOUNTER — Telehealth: Payer: Self-pay | Admitting: *Deleted

## 2014-06-30 ENCOUNTER — Inpatient Hospital Stay (HOSPITAL_COMMUNITY)
Admission: EM | Admit: 2014-06-30 | Discharge: 2014-07-02 | DRG: 064 | Disposition: A | Discharge status: Home / Self Care | Payer: Medicare Other | Attending: Internal Medicine | Admitting: Internal Medicine

## 2014-06-30 ENCOUNTER — Telehealth: Payer: Self-pay | Admitting: Family

## 2014-06-30 ENCOUNTER — Ambulatory Visit
Admission: RE | Admit: 2014-06-30 | Discharge: 2014-06-30 | Disposition: A | Discharge status: Home / Self Care | Payer: Medicare Other | Source: Ambulatory Visit | Attending: Family | Admitting: Family

## 2014-06-30 DIAGNOSIS — S065XAA Traumatic subdural hemorrhage with loss of consciousness status unknown, initial encounter: Secondary | ICD-10-CM

## 2014-06-30 DIAGNOSIS — S065X9A Traumatic subdural hemorrhage with loss of consciousness of unspecified duration, initial encounter: Secondary | ICD-10-CM

## 2014-06-30 DIAGNOSIS — G63 Polyneuropathy in diseases classified elsewhere: Secondary | ICD-10-CM

## 2014-06-30 DIAGNOSIS — I63549 Cerebral infarction due to unspecified occlusion or stenosis of unspecified cerebellar artery: Secondary | ICD-10-CM | POA: Diagnosis present

## 2014-06-30 DIAGNOSIS — E785 Hyperlipidemia, unspecified: Secondary | ICD-10-CM | POA: Diagnosis present

## 2014-06-30 DIAGNOSIS — R634 Abnormal weight loss: Secondary | ICD-10-CM | POA: Diagnosis present

## 2014-06-30 DIAGNOSIS — R531 Weakness: Secondary | ICD-10-CM

## 2014-06-30 DIAGNOSIS — E851 Neuropathic heredofamilial amyloidosis: Secondary | ICD-10-CM

## 2014-06-30 DIAGNOSIS — R9089 Other abnormal findings on diagnostic imaging of central nervous system: Secondary | ICD-10-CM

## 2014-06-30 DIAGNOSIS — I639 Cerebral infarction, unspecified: Secondary | ICD-10-CM

## 2014-06-30 HISTORY — DX: Age-related osteoporosis without current pathological fracture: M81.0

## 2014-06-30 HISTORY — DX: Anxiety disorder, unspecified: F41.9

## 2014-06-30 HISTORY — DX: Gastro-esophageal reflux disease without esophagitis: K21.9

## 2014-06-30 HISTORY — DX: Chronic kidney disease, stage 3 (moderate): N18.3

## 2014-06-30 HISTORY — DX: Acute embolism and thrombosis of unspecified deep veins of unspecified lower extremity: I82.409

## 2014-06-30 HISTORY — DX: Cerebral infarction, unspecified: I63.9

## 2014-06-30 HISTORY — DX: Dorsalgia, unspecified: M54.9

## 2014-06-30 HISTORY — DX: Chronic kidney disease, stage 3 unspecified: N18.30

## 2014-06-30 HISTORY — DX: Other chronic pain: G89.29

## 2014-06-30 HISTORY — DX: Personal history of other diseases of the digestive system: Z87.19

## 2014-06-30 HISTORY — DX: Collapsed vertebra, not elsewhere classified, site unspecified, initial encounter for fracture: M48.50XA

## 2014-06-30 NOTE — Telephone Encounter (Signed)
In order to obtain legal guardianship, they would need to seek counsel from an attorney.  This usually requires a psychiatrist to certify that the patient is not mentally competent and then some legal paperwork through an attorney.  Attorney could also help them with POA issues related to pt's estate.  I think that she could be a candidate for ALF if it is a memory care facility.  In terms of ADL's, she would need all of her food prepared for her and probably help with bathing as well as 24 hour supervision.

## 2014-06-30 NOTE — Telephone Encounter (Signed)
Received call from Dr. Irish Elders, radiology re: MRI results.  He reports that findings are unusual, notes vasogenic edema which he feels could be due to tumor versus venous infarct.  He is recommending MR Venogram and contrast enhanced MRI for further evaluation.   Dr. Leonie Man- when you are out of your meeting, could you please review this MRI? We tried to get this patient in with your office and they could not get pt in to see Dr. Erlinda Hong any earlier than August. She is currently scheduled to see you on 07/25/14 which was your first available.  I appreciate your help with this patient. Feel free to contact me on my cell [phone].

## 2014-06-30 NOTE — ED Notes (Addendum)
Family reported pt.'s worsening confusion/dementia and weight loss , ct scan and MRI done today , advised by MD to go to ER for MRI brain with contrast and possible admission to hospital.

## 2014-06-30 NOTE — Telephone Encounter (Signed)
Reviewed case with Dr. Leonie Man. His concern is that this may represent a malignancy. He recommended MRI with contrast and chest x ray. Recommended inpatient evaluation if change in pt status.  Also recommends that patient be seen by neurologist as work in this week.  Stanton Kidney- could you please work this patient in this week with one of the available neurologists? Thanks.

## 2014-06-30 NOTE — Telephone Encounter (Signed)
Spoke with pt's son-in-law and he is already aware of CT result. Scheduled bmet for 07/05/14 at 4pm. He voices concerns over pt's "rapidly declining dementia".  They are having to watch her almost 24/7. Pt tried to remove pot from stove, was found carrying a knife around the kitchen looking for something she thought she "was supposed to cut up".  Notes that pt is now putting unclean silverware back into the drawer with clean utensils. Pt doesn't remember having an appt with [place] on 06/27/14 and he states he and his wife had a very difficult time trying to convince pt of her need to have an MRI. He states that his wife previously spoke with PCP re: placing pt in assisted living facility. He is wanting to know if this was recommended with memory care or basic assisted living. What do you thinks her ADL's should be?  He states that [name] (pt's daughter) has medical power of attorney but they are wanting to know about getting guardianship so they can make decisions re: pt's health even if the pt refuses. They are wanting to place pt in a facility within the next 60 days.  Also received call back from Dr Phoebe Sharps office, they can get pt in with Dr Leonie Man on 07/25/14 at 11:15am.  Please advise.

## 2014-06-30 NOTE — Telephone Encounter (Signed)
Spoke with Larena Glassman and scheduled pt to see Dr Leonie Man on first FU available, 07/25/14 @ 11:15 am. Larena Glassman stated she would call patient. Informed her to advise pt to arrive 15 min early to register. Larena Glassman verbalized understanding.

## 2014-06-30 NOTE — Telephone Encounter (Signed)
I agree. Stanton Kidney kindly arrange for this patient to be seen by work in [place] as both Dr Erlinda Hong and I cannot see this patient for 2 weeks

## 2014-06-30 NOTE — Telephone Encounter (Signed)
Spoke with [place] at [phone] (Dr Erlinda Hong). He is out of the office until 07/18/14 and has no availability until 08/16/14. Advised her ok to have pt see Dr Leonie Man if he is available.  Lovey Newcomer is sending message to the nurse to see if pt can be seen sooner re: new stroke.

## 2014-06-30 NOTE — Telephone Encounter (Signed)
[name] with PCP called to get patient in sooner with Dr Erlinda Hong. I r/s from 9/8 to 8/9. She states Dr Leonie Man spoke to Debbrah Alar, NP yesterday and recommended the patient beseen sooner. She is inquiring if patient can see Dr Leonie Man asap. Please call [name] and advise. She can be reached at [phone].

## 2014-06-30 NOTE — Telephone Encounter (Signed)
Notified pt's daughter and she voices understanding. Also confirmed at previous call with pt's son-in-law that pt has been taking lisinopril twice a day and will continue that dose.

## 2014-06-30 NOTE — Telephone Encounter (Signed)
Left message for pt's daughter, [name] to return my call.

## 2014-06-30 NOTE — Telephone Encounter (Signed)
Spoke with pt's son-in-law and reviewed MRI findings, and dr Clydene Fake recommendations. Discussed concern that these findings may indicate malignancy. He reports that patient's confusion is no longer transient and that she is now dragging her right foot. I advised him to bring the patient to the [facility] ED for further evaluation.  Report is given to charge nurse- Lilia Pro.

## 2014-06-30 NOTE — Telephone Encounter (Signed)
Received call from pt's daughter requesting MRI results.  Advised her that PCP has sent message to Dr Leonie Man for further recommendations / interpretation of MRI and PCP will call pt's daughter this evening if she hears back from Dr Leonie Man.  Left detailed message on [name]'s voicemail re: above.

## 2014-07-01 ENCOUNTER — Emergency Department (HOSPITAL_COMMUNITY): Payer: Medicare Other

## 2014-07-01 ENCOUNTER — Encounter (HOSPITAL_COMMUNITY): Payer: Self-pay | Admitting: General Practice

## 2014-07-01 DIAGNOSIS — Z90710 Acquired absence of both cervix and uterus: Secondary | ICD-10-CM | POA: Diagnosis not present

## 2014-07-01 DIAGNOSIS — R531 Weakness: Secondary | ICD-10-CM | POA: Diagnosis not present

## 2014-07-01 DIAGNOSIS — E858 Other amyloidosis: Secondary | ICD-10-CM | POA: Diagnosis not present

## 2014-07-01 DIAGNOSIS — I63412 Cerebral infarction due to embolism of left middle cerebral artery: Secondary | ICD-10-CM | POA: Diagnosis not present

## 2014-07-01 DIAGNOSIS — I129 Hypertensive chronic kidney disease with stage 1 through stage 4 chronic kidney disease, or unspecified chronic kidney disease: Secondary | ICD-10-CM | POA: Diagnosis present

## 2014-07-01 DIAGNOSIS — I6200 Nontraumatic subdural hemorrhage, unspecified: Secondary | ICD-10-CM | POA: Diagnosis not present

## 2014-07-01 DIAGNOSIS — F0390 Unspecified dementia without behavioral disturbance: Secondary | ICD-10-CM | POA: Diagnosis present

## 2014-07-01 DIAGNOSIS — F329 Major depressive disorder, single episode, unspecified: Secondary | ICD-10-CM | POA: Diagnosis present

## 2014-07-01 DIAGNOSIS — E851 Neuropathic heredofamilial amyloidosis: Secondary | ICD-10-CM | POA: Diagnosis present

## 2014-07-01 DIAGNOSIS — Z87891 Personal history of nicotine dependence: Secondary | ICD-10-CM | POA: Diagnosis not present

## 2014-07-01 DIAGNOSIS — Z7902 Long term (current) use of antithrombotics/antiplatelets: Secondary | ICD-10-CM | POA: Diagnosis not present

## 2014-07-01 DIAGNOSIS — E854 Organ-limited amyloidosis: Secondary | ICD-10-CM | POA: Diagnosis present

## 2014-07-01 DIAGNOSIS — Z9049 Acquired absence of other specified parts of digestive tract: Secondary | ICD-10-CM | POA: Diagnosis present

## 2014-07-01 DIAGNOSIS — I6202 Nontraumatic subacute subdural hemorrhage: Secondary | ICD-10-CM | POA: Diagnosis present

## 2014-07-01 DIAGNOSIS — H9190 Unspecified hearing loss, unspecified ear: Secondary | ICD-10-CM | POA: Diagnosis present

## 2014-07-01 DIAGNOSIS — Z66 Do not resuscitate: Secondary | ICD-10-CM | POA: Diagnosis present

## 2014-07-01 DIAGNOSIS — E785 Hyperlipidemia, unspecified: Secondary | ICD-10-CM | POA: Diagnosis not present

## 2014-07-01 DIAGNOSIS — Z86718 Personal history of other venous thrombosis and embolism: Secondary | ICD-10-CM | POA: Diagnosis not present

## 2014-07-01 DIAGNOSIS — S065X9A Traumatic subdural hemorrhage with loss of consciousness of unspecified duration, initial encounter: Secondary | ICD-10-CM

## 2014-07-01 DIAGNOSIS — G936 Cerebral edema: Secondary | ICD-10-CM | POA: Diagnosis present

## 2014-07-01 DIAGNOSIS — R4182 Altered mental status, unspecified: Secondary | ICD-10-CM | POA: Diagnosis present

## 2014-07-01 DIAGNOSIS — N183 Chronic kidney disease, stage 3 (moderate): Secondary | ICD-10-CM | POA: Diagnosis present

## 2014-07-01 DIAGNOSIS — Z8673 Personal history of transient ischemic attack (TIA), and cerebral infarction without residual deficits: Secondary | ICD-10-CM | POA: Diagnosis not present

## 2014-07-01 DIAGNOSIS — Z91040 Latex allergy status: Secondary | ICD-10-CM | POA: Diagnosis not present

## 2014-07-01 DIAGNOSIS — I680 Cerebral amyloid angiopathy: Secondary | ICD-10-CM | POA: Diagnosis present

## 2014-07-01 DIAGNOSIS — G63 Polyneuropathy in diseases classified elsewhere: Secondary | ICD-10-CM | POA: Diagnosis present

## 2014-07-01 DIAGNOSIS — Z880 Allergy status to penicillin: Secondary | ICD-10-CM | POA: Diagnosis not present

## 2014-07-01 DIAGNOSIS — S065XAA Traumatic subdural hemorrhage with loss of consciousness status unknown, initial encounter: Secondary | ICD-10-CM

## 2014-07-01 DIAGNOSIS — Z91048 Other nonmedicinal substance allergy status: Secondary | ICD-10-CM | POA: Diagnosis not present

## 2014-07-01 LAB — URINALYSIS, ROUTINE W REFLEX MICROSCOPIC
BILIRUBIN URINE: NEGATIVE
Glucose, UA: NEGATIVE mg/dL
HGB URINE DIPSTICK: NEGATIVE
Ketones, ur: NEGATIVE mg/dL
Leukocytes, UA: NEGATIVE
Nitrite: NEGATIVE
PH: 7.5 (ref 5.0–8.0)
Protein, ur: NEGATIVE mg/dL
SPECIFIC GRAVITY, URINE: 1.011 (ref 1.005–1.030)
UROBILINOGEN UA: 0.2 mg/dL (ref 0.0–1.0)

## 2014-07-01 LAB — COMPREHENSIVE METABOLIC PANEL
ALBUMIN: 3.8 g/dL (ref 3.5–5.0)
ALK PHOS: 56 U/L (ref 38–126)
ALT: 25 U/L (ref 14–54)
ANION GAP: 11 (ref 5–15)
AST: 24 U/L (ref 15–41)
BILIRUBIN TOTAL: 0.6 mg/dL (ref 0.3–1.2)
BUN: 11 mg/dL (ref 6–20)
CALCIUM: 9.6 mg/dL (ref 8.9–10.3)
CO2: 25 mmol/L (ref 22–32)
CREATININE: 0.98 mg/dL (ref 0.44–1.00)
Chloride: 97 mmol/L — ABNORMAL LOW (ref 101–111)
GFR calc Af Amer: >60 mL/min (ref >60)
GFR calc non Af Amer: 53 mL/min — ABNORMAL LOW (ref >60)
Glucose, Bld: 93 mg/dL (ref 65–99)
Potassium: 3.6 mmol/L (ref 3.5–5.1)
Sodium: 133 mmol/L — ABNORMAL LOW (ref 135–145)
Total Protein: 6.4 g/dL — ABNORMAL LOW (ref 6.5–8.1)

## 2014-07-01 LAB — CBC WITH DIFFERENTIAL/PLATELET
BASOS PCT: 0 % (ref 0–1)
Basophils Absolute: 0 10*3/uL (ref 0.0–0.1)
Eosinophils Absolute: 0.1 10*3/uL (ref 0.0–0.7)
Eosinophils Relative: 2 % (ref 0–5)
HEMATOCRIT: 35.8 % — AB (ref 36.0–46.0)
Hemoglobin: 12.6 g/dL (ref 12.0–15.0)
Lymphocytes Relative: 30 % (ref 12–46)
Lymphs Abs: 1.7 10*3/uL (ref 0.7–4.0)
MCH: 31.8 pg (ref 26.0–34.0)
MCHC: 35.2 g/dL (ref 30.0–36.0)
MCV: 90.4 fL (ref 78.0–100.0)
MONOS PCT: 10 % (ref 3–12)
Monocytes Absolute: 0.5 10*3/uL (ref 0.1–1.0)
NEUTROS ABS: 3.3 10*3/uL (ref 1.7–7.7)
Neutrophils Relative %: 58 % (ref 43–77)
Platelets: 208 10*3/uL (ref 150–400)
RBC: 3.96 MIL/uL (ref 3.87–5.11)
RDW: 12.3 % (ref 11.5–15.5)
WBC: 5.6 10*3/uL (ref 4.0–10.5)

## 2014-07-01 LAB — TROPONIN I: Troponin I: <0.03 ng/mL (ref <0.031)

## 2014-07-01 MED ORDER — CLOPIDOGREL BISULFATE 75 MG PO TABS
75.0000 mg | ORAL_TABLET | Freq: Every day | ORAL | Status: DC
  Administered 2014-07-01 – 2014-07-02 (×2): 75 mg via ORAL
  Filled 2014-07-01 (×2): qty 1

## 2014-07-01 MED ORDER — ACETAMINOPHEN 650 MG RE SUPP: 650.0000 mg | Freq: Four times a day (QID) | RECTAL | Status: DC | PRN

## 2014-07-01 MED ORDER — SODIUM CHLORIDE 0.9 % IV SOLN
INTRAVENOUS | Status: DC
  Administered 2014-07-01 – 2014-07-02 (×2): via INTRAVENOUS

## 2014-07-01 MED ORDER — ADULT MULTIVITAMIN W/MINERALS CH
1.0000 | ORAL_TABLET | Freq: Every day | ORAL | Status: DC
  Administered 2014-07-01 – 2014-07-02 (×2): 1 via ORAL
  Filled 2014-07-01 (×2): qty 1

## 2014-07-01 MED ORDER — METOPROLOL SUCCINATE ER 25 MG PO TB24
25.0000 mg | ORAL_TABLET | Freq: Every day | ORAL | Status: DC
  Administered 2014-07-01 – 2014-07-02 (×2): 25 mg via ORAL
  Filled 2014-07-01 (×2): qty 1

## 2014-07-01 MED ORDER — LISINOPRIL 10 MG PO TABS
10.0000 mg | ORAL_TABLET | Freq: Two times a day (BID) | ORAL | Status: DC
  Administered 2014-07-01 – 2014-07-02 (×3): 10 mg via ORAL
  Filled 2014-07-01 (×4): qty 1

## 2014-07-01 MED ORDER — VITAMIN B-12 1000 MCG PO TABS
1000.0000 ug | ORAL_TABLET | Freq: Every day | ORAL | Status: DC
  Administered 2014-07-01 – 2014-07-02 (×2): 1000 ug via ORAL
  Filled 2014-07-01 (×2): qty 1

## 2014-07-01 MED ORDER — ENSURE ENLIVE PO LIQD
237.0000 mL | Freq: Two times a day (BID) | ORAL | Status: DC
  Administered 2014-07-02: 237 mL via ORAL

## 2014-07-01 MED ORDER — GADOBENATE DIMEGLUMINE 529 MG/ML IV SOLN
10.0000 mL | Freq: Once | INTRAVENOUS | Status: AC
  Administered 2014-07-01: 9 mL via INTRAVENOUS

## 2014-07-01 MED ORDER — ONDANSETRON HCL 4 MG/2ML IJ SOLN: 4.0000 mg | Freq: Four times a day (QID) | INTRAMUSCULAR | Status: DC | PRN

## 2014-07-01 MED ORDER — ACETAMINOPHEN 325 MG PO TABS: 650.0000 mg | ORAL_TABLET | Freq: Four times a day (QID) | ORAL | Status: DC | PRN

## 2014-07-01 MED ORDER — ONDANSETRON HCL 4 MG PO TABS: 4.0000 mg | ORAL_TABLET | Freq: Four times a day (QID) | ORAL | Status: DC | PRN

## 2014-07-01 MED ORDER — ATORVASTATIN CALCIUM 80 MG PO TABS
80.0000 mg | ORAL_TABLET | Freq: Every morning | ORAL | Status: DC
  Administered 2014-07-01 – 2014-07-02 (×2): 80 mg via ORAL
  Filled 2014-07-01 (×2): qty 1

## 2014-07-01 MED ORDER — FLUOXETINE HCL 20 MG PO TABS
20.0000 mg | ORAL_TABLET | Freq: Every day | ORAL | Status: DC
  Administered 2014-07-01 – 2014-07-02 (×2): 20 mg via ORAL
  Filled 2014-07-01 (×2): qty 1

## 2014-07-01 NOTE — ED Provider Notes (Signed)
CSN: [id number]     Arrival date & time 06/30/14  2033 History   First MD Initiated Contact with Patient 06/30/14 2332     Chief Complaint  Patient presents with  . Altered Mental Status    Level V caveat due to some dementia (Consider location/radiation/quality/duration/timing/severity/associated sxs/prior Treatment) Patient is a 81 y.o. female presenting with altered mental status. The history is provided by the patient and a relative.  Altered Mental Status Presenting symptoms: confusion    patient with altered mental status. Has been worsening over the last few weeks. Has been seen by her primary care and had an outpatient CT that showed a frontal abnormality. And had MRI that showed some edema of unclear cause. Sent in to the ED for further evaluation and possible admission. Patient had been having some episodes of difficulty speaking. Has been having more confusion and memory issues. Was initially episodic but now seems to be constant. No trauma. No chest pain. She has been losing about a pound a week also. No fevers.  Past Medical History  Diagnosis Date  . Hypertension   . Hyperlipemia   . Dementia   . Cerebral hemorrhage, nontraumatic 2011  . CVA (cerebral infarction) 2011    left cerebellum  . Ureterocele, acquired   . Broken arm     left x 3  . Brain swelling 2016  . History of pubovaginal sling   . Depression   . Chronic kidney disease     stage 3  . Stroke 2015 & 2016    Dec; Jan, blockage cerebellum  . Memory loss   . Confusion   . Falls frequently   . Hearing loss    Past Surgical History  Procedure Laterality Date  . Abdominal hysterectomy    . Cholecystectomy    . Fracture surgery Left     3 fractures in past  . Other surgical history  2006    "bladder tac"   Family History  Problem Relation Age of Onset  . Stroke Mother   . Diabetes Mother   . Hyperlipidemia Mother   . Hypertension Mother   . Stroke Father   . Heart disease Father   .  Hyperlipidemia Father   . Depression Sister   . Cancer Brother 40    lung   History  Substance Use Topics  . Smoking status: Former Research scientist (life sciences)  . Smokeless tobacco: Not on file  . Alcohol Use: No   OB History    No data available     Review of Systems  Unable to perform ROS Respiratory: Negative for shortness of breath.   Cardiovascular: Negative for chest pain.  Musculoskeletal: Negative for back pain.  Neurological: Negative for syncope.  Hematological: Negative for adenopathy.  Psychiatric/Behavioral: Positive for confusion. Negative for decreased concentration.      Allergies  Penicillins; Adhesive; and Latex  Home Medications   Prior to Admission medications   Medication Sig Start Date End Date Taking? Authorizing Provider  alendronate (FOSAMAX) 70 MG tablet Take 1 tablet (70 mg total) by mouth once a week. Patient taking differently: Take 70 mg by mouth once a week. On Thursdays 03/31/14   Debbrah Alar, NP  atorvastatin (LIPITOR) 80 MG tablet Take 1 tablet (80 mg total) by mouth every morning. 03/31/14   Debbrah Alar, NP  Calcium Carbonate-Vitamin D 600-400 MG-UNIT per tablet Take 1 tablet by mouth 2 (two) times daily.    Historical Provider, MD  clopidogrel (PLAVIX) 75 MG tablet Take 1  tablet (75 mg total) by mouth daily. 02/23/14   Lavon Paganini Angiulli, PA-C  FLUoxetine (PROZAC) 20 MG tablet Take 1 tablet (20 mg total) by mouth daily. 05/02/14   Debbrah Alar, NP  lisinopril (PRINIVIL,ZESTRIL) 10 MG tablet Take 1 tablet (10 mg total) by mouth 2 (two) times daily. 06/27/14   Debbrah Alar, NP  metoprolol succinate (TOPROL-XL) 25 MG 24 hr tablet Take 1 tablet (25 mg total) by mouth daily. 06/13/14   Debbrah Alar, NP  Multiple Vitamin (MULTIVITAMIN WITH MINERALS) TABS Take 1 tablet by mouth daily.    Historical Provider, MD  ranitidine (ZANTAC) 150 MG tablet Take 1 tablet (150 mg total) by mouth at bedtime. 04/15/14   Brunetta Jeans, PA-C  vitamin B-12  (CYANOCOBALAMIN) 1000 MCG tablet Take 1,000 mcg by mouth daily.    Historical Provider, MD   BP 191/70 mmHg  Pulse 54  Temp(Src) 97.8 F (36.6 C) (Oral)  Resp 16  Ht 5\' 2"  (1.575 m)  Wt 117 lb 12.8 oz (53.434 kg)  BMI 21.54 kg/m2  SpO2 96% Physical Exam  Constitutional: She appears well-developed.  HENT:  Head: Atraumatic.  Eyes: Pupils are equal, round, and reactive to light.  Neck: Neck supple.  Cardiovascular: Normal rate and regular rhythm.   Pulmonary/Chest: Effort normal.  Abdominal: Soft.  Musculoskeletal: She exhibits no edema.  Neurological: She is alert.  Pleasant but with some confusion. Follows commands. Moves all extremities. Alert to self and close to the date. No she is in the hospital and knows it is [facility].  Skin: Skin is warm.    ED Course  Procedures (including critical care time) Labs Review Labs Reviewed  COMPREHENSIVE METABOLIC PANEL  CBC WITH DIFFERENTIAL/PLATELET  URINALYSIS, ROUTINE W REFLEX MICROSCOPIC (NOT AT [facility])  TROPONIN I    Imaging Review Mr Herby Abraham Contrast  06/30/2014   CLINICAL DATA:  81 year old hypertensive female with history of hyperlipidemia and dementia presenting with increasing confusion over the past 2 weeks and possible stroke. Abnormal CT. Subsequent encounter.  EXAM: MRI HEAD WITHOUT CONTRAST  TECHNIQUE: Multiplanar, multiecho pulse sequences of the brain and surrounding structures were obtained without intravenous contrast.  COMPARISON:  06/28/2014 CT.  04/07/2014 MR.  FINDINGS: Representing a significant change from the relatively recent MR (04/07/2014) is the presence of prominent vasogenic edema within the anterior superior left frontal lobe and the anterior to mid left temporal lobe. Along the periphery are scattered blood breakdown products with focal 9 mm hematoma superior anterior left frontal lobe. Etiology is indeterminate. Mild local mass effect with slight inferior displacement of the left lateral  ventricle.  This may reflect changes of amyloid angiography. Cortical vein thrombosis could cause a similar appearance although the major dural sinuses are patent. Metastatic disease is not entirely excluded. Contrast was not administered. Infection does not appear to be the case clinically and the MR findings are not indicative of such.  Patient would benefit from contrast-enhanced imaging to exclude underlying lesion in addition to followup imaging to help demonstrate that the vasogenic clears as would be expected with a benign process.  No acute thrombotic infarct.  Remote partially hemorrhagic infarct mid to inferior left cerebellum.  Mild small vessel disease type changes.  Global atrophy without hydrocephalus.  Major intracranial vascular structures are patent.  IMPRESSION: Representing a significant change from the relatively recent MR (04/07/2014) is the presence of prominent vasogenic edema within the anterior superior left frontal lobe and the anterior to mid left temporal lobe. Along  the periphery are scattered blood breakdown products with focal 9 mm hematoma superior anterior left frontal lobe. Etiology is indeterminate. Mild local mass effect with slight inferior displacement of the left lateral ventricle.  This may reflect changes of amyloid angiography. Cortical vein thrombosis could cause a similar appearance although the major dural sinuses are patent. Metastatic disease is not entirely excluded.  Patient would benefit from contrast-enhanced imaging to exclude underlying lesion in addition to followup imaging to help demonstrate that the vasogenic clears as would be expected with a benign process.  No acute thrombotic infarct.  Remote partially hemorrhagic infarct mid to inferior left cerebellum.  These results were called by telephone at the time of interpretation on 06/30/2014 at 4:24 pm to [facility], Nurse Practitioner, who verbally acknowledged these results.   Electronically Signed    By: Genia Del M.D.   On: 06/30/2014 17:00     EKG Interpretation None      MDM   Final diagnoses:  None    Patient with altered mental status. Abnormality on outpatient MRI. Deficits have gone from occasional to constant. D/w Dr Posey Pronto. Will get results of Contrast MRI before dispo    Davonna Belling, MD 07/01/14 [phone]

## 2014-07-01 NOTE — ED Notes (Addendum)
Per pt, pt has incontinent episode while at MRI. Sample not collected. Per pt, "flooded" the bed.

## 2014-07-01 NOTE — Evaluation (Signed)
Clinical/Bedside Swallow Evaluation Patient Details  Name: [name] MRN: [id number] Date of Birth: 1933/03/27  Today's Date: 07/01/2014 Time: SLP Start Time (ACUTE ONLY): 1045 SLP Stop Time (ACUTE ONLY): 1105 SLP Time Calculation (min) (ACUTE ONLY): 20 min  Past Medical History:  Past Medical History  Diagnosis Date  . Hypertension   . Hyperlipemia   . Dementia   . Cerebral hemorrhage, nontraumatic 2011  . CVA (cerebral infarction) 2011    left cerebellum  . Ureterocele, acquired   . Broken arm     left x 3  . Brain swelling 2016  . History of pubovaginal sling   . Depression   . Chronic kidney disease     stage 3  . Stroke 2015 & 2016    Dec; Jan, blockage cerebellum  . Memory loss   . Confusion   . Falls frequently   . Hearing loss    Past Surgical History:  Past Surgical History  Procedure Laterality Date  . Abdominal hysterectomy    . Cholecystectomy    . Fracture surgery Left     3 fractures in past  . Other surgical history  2006    "bladder tac"   HPI:  81 yo female adm to [facility] with AMS, progressive weight loss (1 lb a week).  Per imaging studies, pt with subacute frontal hematoma on left and left posterior inferior cerebellar artery infarct.  Pt failed RNSSS as she has h/o dysphagia from her January 2016 CVA.     Assessment / Plan / Recommendation Clinical Impression  Pt presents with functional oropharyngeal swallow without indication of airway compromise.  Mastication was efficient with no oral residuals apparent.  Pt does take small bites and daughter reports pt with poor intake typically eating only a cup of something.  Recommend regular/thin diet as swallow ability appears to be at baseline.  SLP to sign off.     Aspiration Risk  Mild    Diet Recommendation Age appropriate regular solids;Thin   Medication Administration: Whole meds with liquid Compensations: Slow rate    Other  Recommendations Oral Care Recommendations: Oral care BID   Follow  Up Recommendations       Frequency and Duration        Pertinent Vitals/Pain Afebrile, decreased      Swallow Study Prior Functional Status   see hhx= pt on regular diet PTA    General Date of Onset: 07/01/14 Other Pertinent Information: 81 yo female adm to [facility] with AMS, progressive weight loss (1 lb a week).  Per imaging studies, pt with subacute frontal hematoma on left and left posterior inferior cerebellar artery infarct.  Pt failed RNSSS as she has h/o dysphagia from her January 2016 CVA.   Type of Study: Bedside swallow evaluation Diet Prior to this Study: NPO Temperature Spikes Noted: No Respiratory Status: Room air History of Recent Intubation: No Behavior/Cognition: Alert;Cooperative;Pleasant mood Oral Cavity - Dentition: Other (Comment) (dentures) Self-Feeding Abilities: Able to feed self Patient Positioning: Upright in bed Baseline Vocal Quality: Low vocal intensity (daughter reports progressive decline in phonatory strength) Volitional Cough: Strong Volitional Swallow: Able to elicit    Oral/Motor/Sensory Function Overall Oral Motor/Sensory Function: Appears within functional limits for tasks assessed   Ice Chips Ice chips: Within functional limits   Thin Liquid Thin Liquid: Within functional limits Presentation: Cup;Self Fed;Straw    Nectar Thick Nectar Thick Liquid: Not tested   Honey Thick Honey Thick Liquid: Not tested   Puree Puree: Within functional limits Presentation:  Self Fed;Spoon   Solid   GO    Solid: Within functional limits Presentation: [place], [facility] SLP [phone]

## 2014-07-01 NOTE — H&P (Addendum)
PCP:   [name], NP   Chief Complaint:  Weakness and altered mental status  HPI: 81 year old female who   has a past medical history of Hypertension; Hyperlipemia; Dementia; Cerebral hemorrhage, nontraumatic (2011); CVA (cerebral infarction) (2011); Ureterocele, acquired; Broken arm; Brain swelling (2016); History of pubovaginal sling; Depression; Chronic kidney disease; Stroke (2015 & 2016); Memory loss; Confusion; Falls frequently; and Hearing loss. Today was brought to the ED with worsening right-sided weakness and altered mental status. Patient has a history of left cerebellar infarct, and TIA. She was seen by her primary care provider, who did outpatient CD which showed a frontal abnormality. She also had an MRI brain which showed some edema of unclear cause. She was sent to the ED for further evaluation and possible admission. Patient has been having memory issues as per son-in-law. Also noticed right side clumsiness while walking. There was no dysarthria, no seizure. No visual problems. She also has been losing weight about 1 pound a week. There was no fever, no dysuria urgency frequency of urination. No chest pain shortness of breath.  In the ED MRI brain with contrast was repeated which showed left frontal a amyeloid angiography, subacute subdural hematoma mild mass effect. An old left posterior cerebellar artery infarct.  Allergies:   Allergies  Allergen Reactions  . Penicillins     Reaction: unknown  . Adhesive [Tape] Rash  . Latex Rash      Past Medical History  Diagnosis Date  . Hypertension   . Hyperlipemia   . Dementia   . Cerebral hemorrhage, nontraumatic 2011  . CVA (cerebral infarction) 2011    left cerebellum  . Ureterocele, acquired   . Broken arm     left x 3  . Brain swelling 2016  . History of pubovaginal sling   . Depression   . Chronic kidney disease     stage 3  . Stroke 2015 & 2016    Dec; Jan, blockage cerebellum  . Memory loss     . Confusion   . Falls frequently   . Hearing loss     Past Surgical History  Procedure Laterality Date  . Abdominal hysterectomy    . Cholecystectomy    . Fracture surgery Left     3 fractures in past  . Other surgical history  2006    "bladder tac"    Prior to Admission medications   Medication Sig Start Date End Date Taking? Authorizing Provider  alendronate (FOSAMAX) 70 MG tablet Take 1 tablet (70 mg total) by mouth once a week. Patient taking differently: Take 70 mg by mouth once a week. On Thursdays 03/31/14  Yes Debbrah Alar, NP  atorvastatin (LIPITOR) 80 MG tablet Take 1 tablet (80 mg total) by mouth every morning. 03/31/14  Yes Debbrah Alar, NP  Calcium Carbonate-Vitamin D 600-400 MG-UNIT per tablet Take 1 tablet by mouth 2 (two) times daily.   Yes Historical Provider, MD  clopidogrel (PLAVIX) 75 MG tablet Take 1 tablet (75 mg total) by mouth daily. 02/23/14  Yes Daniel J Angiulli, PA-C  FLUoxetine (PROZAC) 20 MG tablet Take 1 tablet (20 mg total) by mouth daily. 05/02/14  Yes Debbrah Alar, NP  lisinopril (PRINIVIL,ZESTRIL) 10 MG tablet Take 1 tablet (10 mg total) by mouth 2 (two) times daily. 06/27/14  Yes Debbrah Alar, NP  metoprolol succinate (TOPROL-XL) 25 MG 24 hr tablet Take 1 tablet (25 mg total) by mouth daily. 06/13/14  Yes Debbrah Alar, NP  Multiple Vitamin (MULTIVITAMIN WITH  MINERALS) TABS Take 1 tablet by mouth daily.   Yes Historical Provider, MD  ranitidine (ZANTAC) 150 MG tablet Take 1 tablet (150 mg total) by mouth at bedtime. Patient taking differently: Take 150 mg by mouth daily as needed for heartburn.  04/15/14  Yes Brunetta Jeans, PA-C  vitamin B-12 (CYANOCOBALAMIN) 1000 MCG tablet Take 1,000 mcg by mouth daily.   Yes Historical Provider, MD    Social History:  reports that she has quit smoking. She does not have any smokeless tobacco history on file. She reports that she does not drink alcohol or use illicit drugs.  Family  History  Problem Relation Age of Onset  . Stroke Mother   . Diabetes Mother   . Hyperlipidemia Mother   . Hypertension Mother   . Stroke Father   . Heart disease Father   . Hyperlipidemia Father   . Depression Sister   . Cancer Brother 44    lung    Filed Weights   06/30/14 2043  Weight: 53.434 kg (117 lb 12.8 oz)    All the positives are listed in BOLD  Review of Systems:  HEENT: Headache, blurred vision, runny nose, sore throat Neck: Hypothyroidism, hyperthyroidism,,lymphadenopathy Chest : Shortness of breath, history of COPD, Asthma Heart : Chest pain, history of coronary arterey disease GI:  Nausea, vomiting, diarrhea, constipation, GERD GU: Dysuria, urgency, frequency of urination, hematuria Neuro: Stroke, seizures, syncope Psych: Depression, anxiety, hallucinations   Physical Exam: Blood pressure 161/55, pulse 48, temperature 97.8 F (36.6 C), temperature source Oral, resp. rate 14, height 5\' 2"  (1.575 m), weight 53.434 kg (117 lb 12.8 oz), SpO2 96 %. Constitutional:   Patient is a well-developed and well-nourished female* in no acute distress and cooperative with exam. Head: Normocephalic and atraumatic Mouth: Mucus membranes moist Eyes: PERRL, EOMI, conjunctivae normal Neck: Supple, No Thyromegaly Cardiovascular: RRR, S1 normal, S2 normal Pulmonary/Chest: CTAB, no wheezes, rales, or rhonchi Abdominal: Soft. Non-tender, non-distended, bowel sounds are normal, no masses, organomegaly, or guarding present.  Neurological: A&O x3, Strength is normal and symmetric bilaterally, cranial nerve II-XII are grossly intact, no focal motor deficit, sensory intact to light touch bilaterally.  Extremities : No Cyanosis, Clubbing or Edema  Labs on Admission:  Basic Metabolic Panel:  Recent Labs Lab 07/01/14 0041  NA 133*  K 3.6  CL 97*  CO2 25  GLUCOSE 93  BUN 11  CREATININE 0.98  CALCIUM 9.6   Liver Function Tests:  Recent Labs Lab 07/01/14 0041  AST 24  ALT  25  ALKPHOS 56  BILITOT 0.6  PROT 6.4*  ALBUMIN 3.8  CBC:  Recent Labs Lab 07/01/14 0041  WBC 5.6  NEUTROABS 3.3  HGB 12.6  HCT 35.8*  MCV 90.4  PLT 208   Cardiac Enzymes:  Recent Labs Lab 07/01/14 0041  TROPONINI <0.03     Radiological Exams on Admission: Dg Chest 2 View  07/01/2014   CLINICAL DATA:  Patient with altered mental status.  Prior stroke.  EXAM: CHEST  2 VIEW  COMPARISON:  Chest radiograph 04/06/2014  FINDINGS: Stable enlarged cardiac and mediastinal contours. No consolidative pulmonary opacities. No pleural effusion or pneumothorax. Mid thoracic spine degenerative changes.  IMPRESSION: No acute cardiopulmonary process.   Electronically Signed   By: Lovey Newcomer M.D.   On: 07/01/2014 01:22   Mr Brain Wo Contrast  06/30/2014   CLINICAL DATA:  81 year old hypertensive female with history of hyperlipidemia and dementia presenting with increasing confusion over the past 2 weeks and  possible stroke. Abnormal CT. Subsequent encounter.  EXAM: MRI HEAD WITHOUT CONTRAST  TECHNIQUE: Multiplanar, multiecho pulse sequences of the brain and surrounding structures were obtained without intravenous contrast.  COMPARISON:  06/28/2014 CT.  04/07/2014 MR.  FINDINGS: Representing a significant change from the relatively recent MR (04/07/2014) is the presence of prominent vasogenic edema within the anterior superior left frontal lobe and the anterior to mid left temporal lobe. Along the periphery are scattered blood breakdown products with focal 9 mm hematoma superior anterior left frontal lobe. Etiology is indeterminate. Mild local mass effect with slight inferior displacement of the left lateral ventricle.  This may reflect changes of amyloid angiography. Cortical vein thrombosis could cause a similar appearance although the major dural sinuses are patent. Metastatic disease is not entirely excluded. Contrast was not administered. Infection does not appear to be the case clinically and the  MR findings are not indicative of such.  Patient would benefit from contrast-enhanced imaging to exclude underlying lesion in addition to followup imaging to help demonstrate that the vasogenic clears as would be expected with a benign process.  No acute thrombotic infarct.  Remote partially hemorrhagic infarct mid to inferior left cerebellum.  Mild small vessel disease type changes.  Global atrophy without hydrocephalus.  Major intracranial vascular structures are patent.  IMPRESSION: Representing a significant change from the relatively recent MR (04/07/2014) is the presence of prominent vasogenic edema within the anterior superior left frontal lobe and the anterior to mid left temporal lobe. Along the periphery are scattered blood breakdown products with focal 9 mm hematoma superior anterior left frontal lobe. Etiology is indeterminate. Mild local mass effect with slight inferior displacement of the left lateral ventricle.  This may reflect changes of amyloid angiography. Cortical vein thrombosis could cause a similar appearance although the major dural sinuses are patent. Metastatic disease is not entirely excluded.  Patient would benefit from contrast-enhanced imaging to exclude underlying lesion in addition to followup imaging to help demonstrate that the vasogenic clears as would be expected with a benign process.  No acute thrombotic infarct.  Remote partially hemorrhagic infarct mid to inferior left cerebellum.  These results were called by telephone at the time of interpretation on 06/30/2014 at 4:24 pm to [facility], Nurse Practitioner, who verbally acknowledged these results.   Electronically Signed   By: Genia Del M.D.   On: 06/30/2014 17:00   Mr Jeri Cos Contrast  07/01/2014   CLINICAL DATA:  Worsening altered mental status for a few weeks, difficulty speaking, confusion and memory issues. History of stroke, hypertension, hyperlipidemia. Followup.  EXAM: MRI HEAD WITHOUT AND WITH CONTRAST   TECHNIQUE: Multiplanar, multiecho pulse sequences of the brain and surrounding structures were obtained without and with intravenous contrast ; limited examination, follow-up to yesterday's noncontrast MRI of the head.  CONTRAST:  9mL MULTIHANCE GADOBENATE DIMEGLUMINE 529 MG/ML IV SOLN  COMPARISON:  MRI of the head June 30, 2014 and MRI head April 07, 2014  FINDINGS: Subcentimeter focus of reduced diffusion within LEFT frontal lobe superior gyrus associated with small amount of susceptibility artifact and T1 shortening. No susceptibility artifact to suggest cortical vein thrombosis. Innumerable punctate supratentorial and cerebellar foci of susceptibility artifact. Faint irregular enhancement associated with the predominately LEFT-sided susceptibility artifact.  No abnormal extra-axial fluid collections or suspicious extra-axial enhancement, no extra-axial mass. Patchy mildly expansile T2 hyperintense signal in the LEFT frontal lobe, LEFT temporal lobe, in addition to subacute to early chronic LEFT posterior inferior cerebellar artery territory infarct.  IMPRESSION:  Susceptibility artifact, sub cm LEFT frontal subacute hematoma, multifocal parenchymal edema and faint enhancement, constellation of findings are most consistent with amyloid angiography without suspicious intracranial mass.  Subacute to chronic appearing LEFT posterior inferior cerebellar artery territory infarct.   Electronically Signed   By: Elon Alas M.D.   On: 07/01/2014 05:48    EKG: Independently reviewed. 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 The MRI shows small left frontal subacute hematoma with marked multifocal parenchymal edema finding  consistent with amyeloid angiographic without any suspicious intracranial mass. Neurology has been consulted for further recommendations, I will hold the Plavix due to the subdural hematoma.  Hyperlipidemia Continue Lipitor.  History of CVA Patient on Plavix at home, Dr. Aram Beecham reviewed the MRI films and recommended to continue with Plavix.   Hypertension Continue lisinopril, metoprolol  Depression Continue Prozac.  DVT prophylaxis SCDs    Code status: Patient is DO NOT RESUSCITATE  Family discussion: Admission, patients condition and plan of care including tests being ordered have been discussed with the patient and *her son-in-law at bedside* who indicate understanding and agree with the plan and Code Status.   Time Spent on Admission: 60 min  [facility] Pager: [phone] 07/01/2014, 8:01 AM  If 7PM-7AM, please contact night-coverage  [email or web address]  Password [email or web address]

## 2014-07-01 NOTE — Progress Notes (Signed)
Initial Nutrition Assessment  DOCUMENTATION CODES:  Not applicable  INTERVENTION:  No nutrition intervention at this time --- patient declined  NUTRITION DIAGNOSIS:  Inadequate oral intake related to  (poor appetite) as evidenced by per patient/family report  GOAL:  Patient will meet greater than or equal to 90% of their needs  MONITOR:  PO intake, Labs, Weight trends, I & O's  REASON FOR ASSESSMENT:  Malnutrition Screening Tool  ASSESSMENT: Patient is a 81 y.o. Female with PMH of HTN, dementia; cerebral hemorrhage, CVA, CKD; was brought to the ED with worsening right-sided weakness and altered mental status.   RD spoke with patient and patient's family members at bedside. Patient reports her appetite is so-so".  PO intake 0% per flowsheet records.  She does consume 3 meals per day at home in which her family prepares for her.   Family report pt has had decreased mobility and they feel she's lost weight.  Per wt readings below, patient has had a 5% weight loss since March 2016 (not significant for time frame).  RD offered oral nutrition supplements, however, patient declined.  Nutrition-Focused physical exam completed. Findings are no fat depletion, mild to moderate muscle depletion, and no edema.   Height:  Ht Readings from Last 1 Encounters:  07/01/14 5\' 3"  (1.6 m)    Weight:  Wt Readings from Last 1 Encounters:  07/01/14 114 lb 1.6 oz (51.755 kg)    Ideal Body Weight:  52.2 kg  Wt Readings from Last 10 Encounters:  07/01/14 114 lb 1.6 oz (51.755 kg)  06/30/14 118 lb (53.524 kg)  06/27/14 117 lb 12.8 oz (53.434 kg)  06/13/14 119 lb 9.6 oz (54.25 kg)  06/09/14 120 lb 3.2 oz (54.522 kg)  05/02/14 120 lb (54.432 kg)  04/15/14 120 lb 4 oz (54.545 kg)  04/12/14 122 lb (55.339 kg)  04/11/14 125 lb (56.7 kg)  04/06/14 122 lb 9.6 oz (55.611 kg)    BMI:  Body mass index is 20.22 kg/(m^2).  Estimated Nutritional Needs:  Kcal:  1500-1700  Protein:  70-80  gm  Fluid:  1.5-1.7 L  Skin:  Reviewed, no issues  Diet Order:  Diet Heart Room service appropriate?: Yes with Assist; Fluid consistency:: Thin  EDUCATION NEEDS:  No education needs identified at this time   Intake/Output Summary (Last 24 hours) at 07/01/14 1540 Last data filed at 07/01/14 1330  Gross per 24 hour  Intake     20 ml  Output      0 ml  Net     20 ml    Last BM:  Unknown   Arthur Holms, RD, LDN Pager #: [phone] After-Hours Pager #: [phone]

## 2014-07-01 NOTE — Consult Note (Signed)
Referring Physician: Dr. Darrick Meigs    Chief Complaint: worsening cognitive decline, abnormal MRI brain   HPI:                                                                                                                                         [name] is an 81 y.o. 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 Patient son is at the bedside and provides all clinical information about the patient. I also reviewed the available neurological notes, as patient previously seen by our stroke specialist Dr Erlinda Hong. According to her son, 2 days ago patient developed nausea, dizziness, worsening cognition, and elevated BP, which is the way how she presented with her prior strokes, and for that reason she was evaluated by her general practicioner who ordered CT brain that revealed new findings in the temporal lone and this prompted MRI brain without contrast on 6/23 that revealed " prominent vasogenic edema within the anterior superior left frontal lobe and the anterior to mid left temporal lobe. Along the periphery are scattered blood breakdown products with focal 9 mm hematoma superior anterior left frontal lobe. Etiology is indeterminate. Mild local mass effect with slight inferior displacement of the left lateral ventricle". Patient was then seen in the ED and had a repeat MRI brain with contrast that showed "sub cm LEFT frontal subacute hematoma, multifocal parenchymal edema and faint enhancement, constellation of findings are most consistent with amyloid angiography without suspicious intracranial mass.Subacute to chronic appearing LEFT posterior inferior cerebellar artery territory infarct". Patient has been on plavix. No reported falls within the past  week. Patient denies HA, vertigo, double vision, focal weakness or numbness, slurred speech, or vision impairment. Regarding her dementia, she reportedly is able to perform all her activities of daily living without assistance.  Date last known well: unable to determine Time last known well: unable to determine tPA Given: no, out of the window   Past Medical History  Diagnosis Date  . Hypertension   . Hyperlipemia   . Dementia   . Cerebral hemorrhage, nontraumatic 2011  . CVA (cerebral infarction) 2011    left cerebellum  . Ureterocele, acquired   . Broken arm     left x 3  . Brain swelling 2016  . History of pubovaginal sling   . Depression   . Chronic kidney disease     stage 3  . Stroke 2015 & 2016    Dec; Jan, blockage cerebellum  . Memory loss   . Confusion   . Falls frequently   . Hearing loss     Past Surgical History  Procedure Laterality Date  . Abdominal hysterectomy    . Cholecystectomy    .  Fracture surgery Left     3 fractures in past  . Other surgical history  2006    "bladder tac"    Family History  Problem Relation Age of Onset  . Stroke Mother   . Diabetes Mother   . Hyperlipidemia Mother   . Hypertension Mother   . Stroke Father   . Heart disease Father   . Hyperlipidemia Father   . Depression Sister   . Cancer Brother 84    lung   Social History:  reports that she has quit smoking. She does not have any smokeless tobacco history on file. She reports that she does not drink alcohol or use illicit drugs.  Allergies:  Allergies  Allergen Reactions  . Penicillins     Reaction: unknown  . Adhesive [Tape] Rash  . Latex Rash    Medications:                                                                                                                           Scheduled: . atorvastatin  80 mg Oral q morning - 10a  . clopidogrel  75 mg Oral Daily  . FLUoxetine  20 mg Oral Daily  . lisinopril  10 mg Oral BID  . metoprolol succinate   25 mg Oral Daily  . multivitamin with minerals  1 tablet Oral Daily  . vitamin B-12  1,000 mcg Oral Daily    ROS:                                                                                                                                       History obtained from son, chart review and the patient  General ROS: negative for - chills, fatigue, fever, night sweats, or weight gain. Had experienced weight loss lately.  Psychological ROS: negative for - behavioral disorder, hallucinations, memory difficulties, or suicidal ideation Ophthalmic ROS: negative for - blurry vision, double vision, eye pain or loss of vision ENT ROS: negative for - epistaxis, nasal discharge, oral lesions, sore throat, tinnitus or vertigo Allergy and Immunology ROS: negative for - hives or itchy/watery eyes Hematological and Lymphatic ROS: negative for - bleeding problems, bruising or swollen lymph nodes Endocrine ROS: negative for - galactorrhea, hair pattern changes, polydipsia/polyuria or temperature intolerance Respiratory ROS: negative for - cough, hemoptysis, shortness of breath or wheezing Cardiovascular ROS: negative for - chest pain, dyspnea  on exertion, edema or irregular heartbeat Gastrointestinal ROS: negative for - abdominal pain, diarrhea, hematemesis, vomiting, or stool incontinence Genito-Urinary ROS: negative for - dysuria, hematuria, incontinence or urinary frequency/urgency Musculoskeletal ROS: negative for - joint swelling or muscular weakness Neurological ROS: as noted in HPI Dermatological ROS: negative for rash and skin lesion changes  Physical exam: pleasant female in no apparent distress. Blood pressure 179/63, pulse 49, temperature 97.8 F (36.6 C), temperature source Oral, resp. rate 16, height 5' 2"  (1.575 m), weight 53.434 kg (117 lb 12.8 oz), SpO2 97 %. Head: normocephalic. Neck: supple, no bruits, no JVD. Cardiac: no murmurs. Lungs: clear. Abdomen: soft, no tender, no  mass. Extremities: no edema. Skin: no rash Neurologic Examination:                                                                                                      General: Mental Status: Alert, oriented, thought content appropriate.  Speech fluent without evidence of aphasia.  Able to follow 3 step commands without difficulty. Cranial Nerves: II: Discs flat bilaterally; Visual fields grossly normal, pupils equal, round, reactive to light and accommodation III,IV, VI: ptosis not present, extra-ocular motions intact bilaterally V,VII: smile symmetric, facial light touch sensation normal bilaterally VIII: hearing normal bilaterally IX,X: uvula rises symmetrically XI: bilateral shoulder shrug XII: midline tongue extension without atrophy or fasciculations Motor: Right : Upper extremity   5/5    Left:     Upper extremity   5/5  Lower extremity   5/5     Lower extremity   5/5 Tone and bulk:normal tone throughout; no atrophy noted Sensory: Pinprick and light touch intact throughout, bilaterally Deep Tendon Reflexes:  Right: Upper Extremity   Left: Upper extremity   biceps (C-5 to C-6) 2/4   biceps (C-5 to C-6) 2/4 tricep (C7) 2/4    triceps (C7) 2/4 Brachioradialis (C6) 2/4  Brachioradialis (C6) 2/4  Lower Extremity Lower Extremity  quadriceps (L-2 to L-4) 2/4   quadriceps (L-2 to L-4) 2/4 Achilles (S1) 2/4   Achilles (S1) 2/4  Plantars: Right: downgoing   Left: downgoing Cerebellar: normal finger-to-nose,  normal heel-to-shin test in the right. Mild left intention tremor in the left. Gait:  No tested due to multiple leads.    Results for orders placed or performed during the hospital encounter of 06/30/14 (from the past 48 hour(s))  Comprehensive metabolic panel     Status: Abnormal   Collection Time: 07/01/14 12:41 AM  Result Value Ref Range   Sodium 133 (L) 135 - 145 mmol/L   Potassium 3.6 3.5 - 5.1 mmol/L   Chloride 97 (L) 101 - 111 mmol/L   CO2 25 22 - 32 mmol/L    Glucose, Bld 93 65 - 99 mg/dL   BUN 11 6 - 20 mg/dL   Creatinine, Ser 0.98 0.44 - 1.00 mg/dL   Calcium 9.6 8.9 - 10.3 mg/dL   Total Protein 6.4 (L) 6.5 - 8.1 g/dL   Albumin 3.8 3.5 - 5.0 g/dL   AST 24 15 - 41 U/L   ALT 25 14 -  54 U/L   Alkaline Phosphatase 56 38 - 126 U/L   Total Bilirubin 0.6 0.3 - 1.2 mg/dL   GFR calc non Af Amer 53 (L) >60 mL/min   GFR calc Af Amer >60 >60 mL/min    Comment: (NOTE) The eGFR has been calculated using the CKD EPI equation. This calculation has not been validated in all clinical situations. eGFR's persistently <60 mL/min signify possible Chronic Kidney Disease.    Anion gap 11 5 - 15  CBC with Differential     Status: Abnormal   Collection Time: 07/01/14 12:41 AM  Result Value Ref Range   WBC 5.6 4.0 - 10.5 K/uL   RBC 3.96 3.87 - 5.11 MIL/uL   Hemoglobin 12.6 12.0 - 15.0 g/dL   HCT 35.8 (L) 36.0 - 46.0 %   MCV 90.4 78.0 - 100.0 fL   MCH 31.8 26.0 - 34.0 pg   MCHC 35.2 30.0 - 36.0 g/dL   RDW 12.3 11.5 - 15.5 %   Platelets 208 150 - 400 K/uL   Neutrophils Relative % 58 43 - 77 %   Neutro Abs 3.3 1.7 - 7.7 K/uL   Lymphocytes Relative 30 12 - 46 %   Lymphs Abs 1.7 0.7 - 4.0 K/uL   Monocytes Relative 10 3 - 12 %   Monocytes Absolute 0.5 0.1 - 1.0 K/uL   Eosinophils Relative 2 0 - 5 %   Eosinophils Absolute 0.1 0.0 - 0.7 K/uL   Basophils Relative 0 0 - 1 %   Basophils Absolute 0.0 0.0 - 0.1 K/uL  Troponin I     Status: None   Collection Time: 07/01/14 12:41 AM  Result Value Ref Range   Troponin I <0.03 <0.031 ng/mL    Comment:        NO INDICATION OF MYOCARDIAL INJURY.    Dg Chest 2 View  07/01/2014   CLINICAL DATA:  Patient with altered mental status.  Prior stroke.  EXAM: CHEST  2 VIEW  COMPARISON:  Chest radiograph 04/06/2014  FINDINGS: Stable enlarged cardiac and mediastinal contours. No consolidative pulmonary opacities. No pleural effusion or pneumothorax. Mid thoracic spine degenerative changes.  IMPRESSION: No acute  cardiopulmonary process.   Electronically Signed   By: Lovey Newcomer M.D.   On: 07/01/2014 01:22   Mr Brain Wo Contrast  06/30/2014   CLINICAL DATA:  81 year old hypertensive female with history of hyperlipidemia and dementia presenting with increasing confusion over the past 2 weeks and possible stroke. Abnormal CT. Subsequent encounter.  EXAM: MRI HEAD WITHOUT CONTRAST  TECHNIQUE: Multiplanar, multiecho pulse sequences of the brain and surrounding structures were obtained without intravenous contrast.  COMPARISON:  06/28/2014 CT.  04/07/2014 MR.  FINDINGS: Representing a significant change from the relatively recent MR (04/07/2014) is the presence of prominent vasogenic edema within the anterior superior left frontal lobe and the anterior to mid left temporal lobe. Along the periphery are scattered blood breakdown products with focal 9 mm hematoma superior anterior left frontal lobe. Etiology is indeterminate. Mild local mass effect with slight inferior displacement of the left lateral ventricle.  This may reflect changes of amyloid angiography. Cortical vein thrombosis could cause a similar appearance although the major dural sinuses are patent. Metastatic disease is not entirely excluded. Contrast was not administered. Infection does not appear to be the case clinically and the MR findings are not indicative of such.  Patient would benefit from contrast-enhanced imaging to exclude underlying lesion in addition to followup imaging to help demonstrate that  the vasogenic clears as would be expected with a benign process.  No acute thrombotic infarct.  Remote partially hemorrhagic infarct mid to inferior left cerebellum.  Mild small vessel disease type changes.  Global atrophy without hydrocephalus.  Major intracranial vascular structures are patent.  IMPRESSION: Representing a significant change from the relatively recent MR (04/07/2014) is the presence of prominent vasogenic edema within the anterior superior  left frontal lobe and the anterior to mid left temporal lobe. Along the periphery are scattered blood breakdown products with focal 9 mm hematoma superior anterior left frontal lobe. Etiology is indeterminate. Mild local mass effect with slight inferior displacement of the left lateral ventricle.  This may reflect changes of amyloid angiography. Cortical vein thrombosis could cause a similar appearance although the major dural sinuses are patent. Metastatic disease is not entirely excluded.  Patient would benefit from contrast-enhanced imaging to exclude underlying lesion in addition to followup imaging to help demonstrate that the vasogenic clears as would be expected with a benign process.  No acute thrombotic infarct.  Remote partially hemorrhagic infarct mid to inferior left cerebellum.  These results were called by telephone at the time of interpretation on 06/30/2014 at 4:24 pm to [facility], Nurse Practitioner, who verbally acknowledged these results.   Electronically Signed   By: Genia Del M.D.   On: 06/30/2014 17:00   Mr Jeri Cos Contrast  07/01/2014   CLINICAL DATA:  Worsening altered mental status for a few weeks, difficulty speaking, confusion and memory issues. History of stroke, hypertension, hyperlipidemia. Followup.  EXAM: MRI HEAD WITHOUT AND WITH CONTRAST  TECHNIQUE: Multiplanar, multiecho pulse sequences of the brain and surrounding structures were obtained without and with intravenous contrast ; limited examination, follow-up to yesterday's noncontrast MRI of the head.  CONTRAST:  5m MULTIHANCE GADOBENATE DIMEGLUMINE 529 MG/ML IV SOLN  COMPARISON:  MRI of the head June 30, 2014 and MRI head April 07, 2014  FINDINGS: Subcentimeter focus of reduced diffusion within LEFT frontal lobe superior gyrus associated with small amount of susceptibility artifact and T1 shortening. No susceptibility artifact to suggest cortical vein thrombosis. Innumerable punctate supratentorial and cerebellar  foci of susceptibility artifact. Faint irregular enhancement associated with the predominately LEFT-sided susceptibility artifact.  No abnormal extra-axial fluid collections or suspicious extra-axial enhancement, no extra-axial mass. Patchy mildly expansile T2 hyperintense signal in the LEFT frontal lobe, LEFT temporal lobe, in addition to subacute to early chronic LEFT posterior inferior cerebellar artery territory infarct.  IMPRESSION: Susceptibility artifact, sub cm LEFT frontal subacute hematoma, multifocal parenchymal edema and faint enhancement, constellation of findings are most consistent with amyloid angiography without suspicious intracranial mass.  Subacute to chronic appearing LEFT posterior inferior cerebellar artery territory infarct.   Electronically Signed   By: CElon AlasM.D.   On: 07/01/2014 05:48    Assessment: 81y.o. 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 No obvious mass underlying the area of cerebral edema.Although can not entirely exclude an occult mass involving those locations, an evolving left MCA territory infarct appears to be more likely, which could be  responsible for recent worsening in cognitive decline in this patient with underlying dementia. Will need follow up MRI brain to further assess such findings. Patient had stroke work up recently done. If this is a new cortical infarct, then she may certainly need implantation of a loop recorder searching for occult a fib.  Will  ask stroke team to follow up and make further recommendations.   Stroke Risk Factors -age, HTN, hyperlipidemia, prior stroke.    Dorian Pod, MD [facility] [phone]  07/01/2014, 9:45 AM

## 2014-07-01 NOTE — ED Notes (Signed)
Attempted to page Dr. Posey Pronto to get pt admitted, per [facility] answering service, pt has been deferred and will need to be admitted under another doctor. Informed Dr. Claudine Mouton of case and he placed order for consult to [facility]. Awaiting page for admission order.

## 2014-07-01 NOTE — ED Notes (Signed)
Shanon Brow, Son-in-law states patient has had cognitive decline in the past few months more notable in the last month. Pt has intermittent issues getting words out and has been having bouts of forgetfulness where she cannot recall days events which is not baseline. Patient has also been acting unusual like washing clothes in the kitchen sink. Family also notes pt has decreased appetite and has been steadily losing weight over the past month. Pt alert and oriented x 4 at present time, no weakness, drift or aphasia noted.

## 2014-07-01 NOTE — Telephone Encounter (Signed)
Ok thanks 

## 2014-07-01 NOTE — Progress Notes (Signed)
Utilization Review completed. Fynn Vanblarcom RN BSN CM 

## 2014-07-01 NOTE — Progress Notes (Signed)
Spoke with patient's daughter and son in law while patient getting swallow test done. They prefer to take patient home at discharge with home health services being provided through [facility]. They recognize a need for higher level of care in august when their children go back to school and will not be able to provide supervision while they are at work. They are already looking into private home services, CM gave them information from [facility] as they may be able to use this resource to help in the process of finding placement at ALF etc. Patient has a walker at home, and will recieve PT evalualtion to assess for further needs. Will continue to follow.

## 2014-07-01 NOTE — Telephone Encounter (Signed)
Pt has been admitted to [facility].  Thank you.  Jen, I am cancelling MRI and CXR as they have been done inpatient.

## 2014-07-02 DIAGNOSIS — E858 Other amyloidosis: Secondary | ICD-10-CM

## 2014-07-02 DIAGNOSIS — E785 Hyperlipidemia, unspecified: Secondary | ICD-10-CM

## 2014-07-02 LAB — CBC
HCT: 36.5 % (ref 36.0–46.0)
Hemoglobin: 12.9 g/dL (ref 12.0–15.0)
MCH: 31.9 pg (ref 26.0–34.0)
MCHC: 35.3 g/dL (ref 30.0–36.0)
MCV: 90.3 fL (ref 78.0–100.0)
Platelets: 226 10*3/uL (ref 150–400)
RBC: 4.04 MIL/uL (ref 3.87–5.11)
RDW: 12.3 % (ref 11.5–15.5)
WBC: 6.4 10*3/uL (ref 4.0–10.5)

## 2014-07-02 LAB — COMPREHENSIVE METABOLIC PANEL
ALT: 26 U/L (ref 14–54)
AST: 25 U/L (ref 15–41)
Albumin: 3.5 g/dL (ref 3.5–5.0)
Alkaline Phosphatase: 57 U/L (ref 38–126)
Anion gap: 6 (ref 5–15)
BUN: 7 mg/dL (ref 6–20)
CO2: 26 mmol/L (ref 22–32)
Calcium: 8.8 mg/dL — ABNORMAL LOW (ref 8.9–10.3)
Chloride: 102 mmol/L (ref 101–111)
Creatinine, Ser: 0.9 mg/dL (ref 0.44–1.00)
GFR calc Af Amer: >60 mL/min (ref >60)
GFR calc non Af Amer: 59 mL/min — ABNORMAL LOW (ref >60)
Glucose, Bld: 97 mg/dL (ref 65–99)
Potassium: 4.1 mmol/L (ref 3.5–5.1)
Sodium: 134 mmol/L — ABNORMAL LOW (ref 135–145)
Total Bilirubin: 0.8 mg/dL (ref 0.3–1.2)
Total Protein: 6.4 g/dL — ABNORMAL LOW (ref 6.5–8.1)

## 2014-07-02 NOTE — Progress Notes (Signed)
STROKE TEAM PROGRESS NOTE   HISTORY Jazzy Parmer is a 81 y.o. 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 Patient son is at the bedside and provides all clinical information about the patient. I also reviewed the available neurological notes, as patient previously seen by our stroke specialist Dr Erlinda Hong. According to her son, 2 days ago patient developed nausea, dizziness, worsening cognition, and elevated BP, which is the way how she presented with her prior strokes, and for that reason she was evaluated by her general practicioner who ordered CT brain that revealed new findings in the temporal lone and this prompted MRI.  MRI brain without contrast on 6/23 that revealed " prominent vasogenic edema within the anterior superior left frontal lobe and the anterior to mid left temporal lobe. Along the periphery are scattered blood breakdown products with focal 9 mm hematoma superior anterior left frontal lobe. Etiology is indeterminate. Mild local mass effect with slight inferior displacement of the left lateral ventricle".  Patient was then seen in the ED and had a repeat MRI brain with contrast that showed "sub cm LEFT frontal subacute hematoma, multifocal parenchymal edema and faint enhancement, constellation of findings are most consistent with amyloid angiography without suspicious intracranial mass.Subacute to chronic appearing LEFT posterior inferior cerebellar artery territory infarct". Patient has been on plavix. No reported falls within the past week. Patient denies HA, vertigo, double vision, focal weakness or numbness, slurred speech, or vision impairment. Regarding her dementia, she reportedly is able to perform all her  activities of daily living without assistance.  Date last known well: unable to determine Time last known well: unable to determine tPA Given: no, out of the window   SUBJECTIVE (INTERVAL HISTORY). She is improved. Had hallucinations overnight with confusion this morning. Feel being in the hospital making this worse. Granddaughter is at bedside. They do not want any intervention. She just wants to take patient home. Patient has significant dementia at baseline with recent decline. She has sundowning and episodes of confusion at home. Coming to the hospital is difficult for patient and family. Patient lives with granddaughter. Family would not consider changing her medication and they would not authorize significant intervention for any new diagnosis. They just want patient comfortable at home. They follow with Vascular neurologist Dr. Erlinda Hong out patient. They will follow up in the office. Explained the likely cause of the edema and hemorrhage as amyloid angiopathy. Can't rule out metastatic disease given significant edema but again, they would not authorize intervention even if she did have an underlying lesion.  Patient is present in conversation and nods agreement.  Recommended EEG out patient.   OBJECTIVE Temp:  [97.5 F (36.4 C)-99.3 F (37.4 C)] 99.3 F (37.4 C) (06/25 1330) Pulse Rate:  [56-61] 61 (06/25 1330) Cardiac Rhythm:  [-] Sinus bradycardia (06/25 0745) Resp:  [14-20] 14 (06/25 1330) BP: (156-184)/(70-72) 157/71 mmHg (06/25 1330) SpO2:  [95 %-98 %] 95 % (06/25 1330)  No results for input(s): GLUCAP in the last 168 hours.  Recent Labs Lab 07/01/14 0041 07/02/14 0630  NA 133* 134*  K 3.6 4.1  CL 97* 102  CO2 25 26  GLUCOSE 93 97  BUN 11 7  CREATININE 0.98 0.90  CALCIUM 9.6 8.8*    Recent Labs Lab 07/01/14  0041 07/02/14 0630  AST 24 25  ALT 25 26  ALKPHOS 56 57  BILITOT 0.6 0.8  PROT 6.4* 6.4*  ALBUMIN 3.8 3.5    Recent Labs Lab 07/01/14 0041 07/02/14 0630   WBC 5.6 6.4  NEUTROABS 3.3  --   HGB 12.6 12.9  HCT 35.8* 36.5  MCV 90.4 90.3  PLT 208 226    Recent Labs Lab 07/01/14 0041  TROPONINI <0.03   No results for input(s): LABPROT, INR in the last 72 hours.  Recent Labs  07/01/14 1023  COLORURINE YELLOW  LABSPEC 1.011  PHURINE 7.5  GLUCOSEU NEGATIVE  HGBUR NEGATIVE  BILIRUBINUR NEGATIVE  KETONESUR NEGATIVE  PROTEINUR NEGATIVE  UROBILINOGEN 0.2  NITRITE NEGATIVE  LEUKOCYTESUR NEGATIVE       Component Value Date/Time   CHOL 126 02/01/2014 0310   TRIG 46 02/01/2014 0310   HDL 46 02/01/2014 0310   CHOLHDL 2.7 02/01/2014 0310   VLDL 9 02/01/2014 0310   LDLCALC 71 02/01/2014 0310   Lab Results  Component Value Date   HGBA1C 5.6 02/01/2014   No results found for: LABOPIA, COCAINSCRNUR, LABBENZ, AMPHETMU, THCU, LABBARB  No results for input(s): ETH in the last 168 hours.  Imaging   Dg Chest 2 View 07/01/2014    No acute cardiopulmonary process.     Mr Brain Wo Contrast 06/30/2014 Representing a significant change from the relatively recent MR (04/07/2014) is the presence of prominent vasogenic edema within the anterior superior left frontal lobe and the anterior to mid left temporal lobe. Along the periphery are scattered blood breakdown products with focal 9 mm hematoma superior anterior left frontal lobe. Etiology is indeterminate. Mild local mass effect with slight inferior displacement of the left lateral ventricle.  This may reflect changes of amyloid angiography. Cortical vein thrombosis could cause a similar appearance although the major dural sinuses are patent. Metastatic disease is not entirely excluded.  Patient would benefit from contrast-enhanced imaging to exclude underlying lesion in addition to followup imaging to help demonstrate that the vasogenic clears as would be expected with a benign process.  No acute thrombotic infarct.  Remote partially hemorrhagic infarct mid to inferior  left cerebellum.   Mr Jeri Cos Contrast 07/01/2014    Susceptibility artifact, sub cm LEFT frontal subacute hematoma, multifocal parenchymal edema and faint enhancement, constellation of findings are most consistent with amyloid angiography without suspicious intracranial mass.  Subacute to chronic appearing LEFT posterior inferior cerebellar artery territory infarct.     CTA head and neck 02/03/2014 Acute infarct in the left inferior cerebellum in the PICA territory shows progressive edema and swelling with mass effect on the fourth ventricle. No obstructive hydrocephalus or hemorrhage identified. Left PICA is now patent. Both vertebral arteries are widely patent. Bilateral carotid atherosclerotic disease.  Ulcerated plaque involving the right common carotid artery.  No significant internal carotid artery stenosis.  Moderately severe stenosis proximal right external carotid artery. 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   PHYSICAL EXAM  PHYSICAL EXAM Physical exam: Exam: Gen: NAD Eyes: anicteric sclerae, moist conjunctivae                    CV: no MRG, no carotid bruits, no peripheral edema Mental Status: Alert, follows commands, poor historian  Neuro: Detailed Neurologic Exam  Speech:    No aphasia, no dysarthria  Cranial Nerves:    The pupils are equal, round, and reactive to light.fundi flat.  EOMI. No gaze preference. Visual fields full.  Face symmetric, Tongue midline. Hearing intact to voice. Shoulder shrug intact. Palate elevated symmetrically.   Motor Observation:    no involuntary movements noted. Tone appears normal.     Strength:    4+/5 in all extremities     Sensation:  Intact to LT  Plantars downgoing.   DTRs: reduced achilles, otherwise intact and symmetrical  Coordination: No dysmetria   ASSESSMENT/PLAN Ms. Jalon Blackwelder is a 81 y.o. female with history of dementia, hypertension, hyperlipidemia, cerebral hemorrhage, previous  DVT, previous stroke, and chronic kidney disease stage III presenting with cognitive decline and abnormal imaging. She did not receive IV t-PA due to late presentation.   Amyloid angiography with sub centimeter left frontal subacute hematoma  MRI  as above  MRA  not performed  Carotid Doppler 02/01/2014 Bilateral: 1-39% ICA stenosis. Vertebral artery flow is antegrade.  2D Echo  02/01/2014 EF 55-60%. No cardiac source of emboli identified.  LDL 02/01/2014 - 71  HgbA1c pending 02/01/2014 - 5.6  SCDs for VTE prophylaxis  Diet Heart Room service appropriate?: Yes with Assist; Fluid consistency:: Thin  Diet - low sodium heart healthy  clopidogrel 75 mg orally every day prior to admission, now on clopidogrel 75 mg orally every day  Ongoing aggressive stroke risk factor management  Disposition:  Home   Hypertension  Home meds: Lisinopril and metoprolol  Stable    Hyperlipidemia  Home meds: Lipitor 80 mg daily  resumed in hospital  LDL 71, goal < 70  Continue statin at discharge    Other Stroke Risk Factors  Advanced age  Cigarette smoker, quit smoking   Hx stroke/TIA  Family hx stroke (mother)   Other Active Problems    Other Pertinent History    Hospital day # 1    Granddaughter is at bedside. They do not want any intervention. She just wants to take patient home. Patient has significant dementia at baseline with recent decline. She has sundowning and episodes of confusion at home. Coming to the hospital is difficult for patient and family. Patient lives with granddaughter. Family would not consider changing her medication and they would not authorize significant intervention for any new diagnosis. They just want patient comfortable at home. They follow with Vascular neurologist Dr. Erlinda Hong out patient. They will follow up in the office. Explained the likely cause of the edema and hemorrhage as amyloid angiopathy. Can't rule out metastatic disease given  significant edema but again, they would not authorize intervention even if she did have an underlying lesion.  Agree, family can take patient home. Discussed with primary physician. Will see her outpatient. Patient is present in conversation and nods agreement.    Personally examined patient and images, and have participated in and made any corrections needed to history, physical, neuro exam,assessment and plan as stated above.  Sarina Ill, MD Stroke Neurology [phone] [facility]    To contact Stroke Continuity provider, please refer to [email or web address] After hours, contact General Neurology

## 2014-07-02 NOTE — Discharge Instructions (Signed)

## 2014-07-02 NOTE — Discharge Summary (Signed)
Physician Discharge Summary  Contina Strain IRS:[id number] DOB: [birth year] DOA: 06/30/2014  PCP: [name], NP  Admit date: 06/30/2014 Discharge date: 07/02/2014  Recommendations for Outpatient Follow-up:  1. Pt will need to follow up with PCP in 2-3 weeks post discharge 2. Please obtain BMP to evaluate electrolytes and kidney function 3. 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 Pt's general practitioner ordered CT brain that revealed new findings in the temporal lone and this prompted MRI brain without contrast on 6/23 that revealed " prominent vasogenic edema within the anterior superior left frontal lobe and the anterior to mid left temporal lobe. Along the periphery are scattered blood breakdown products with focal 9 mm hematoma superior anterior left frontal lobe. Etiology is indeterminate. Mild local mass effect with slight inferior displacement of the left lateral ventricle".   Patient was then seen in the ED and had a repeat MRI brain with contrast that showed "sub cm LEFT frontal subacute hematoma, multifocal parenchymal edema and faint enhancement, constellation of findings are most consistent with amyloid angiography without suspicious intracranial mass. Subacute to chronic appearing LEFT posterior inferior cerebellar artery territory  infarct".  Hospital Course:   Amyloid angiography with sub centimeter left frontal subacute hematoma  MRI as above  Carotid Doppler 02/01/2014 Bilateral: 1-39% ICA stenosis. Vertebral artery flow is antegrade.  2D Echo 02/01/2014 EF 55-60%. No cardiac source of emboli identified  Neurologist d/w pt and granddaughter at bedside, they do not want any interventions and want to go home  Family wants to ensure comfort for pt and want to follow up with Dr. Erlinda Hong at discharge   Hypertension  Home meds: Lisinopril and metoprolol  Stable  Hyperlipidemia  Home meds: Lipitor 80 mg daily  LDL 71, goal < 70  Continue statin at discharge  Procedures/Studies: Dg Chest 2 View 07/01/2014  No acute cardiopulmonary process.     Ct Head Wo Contrast 06/29/2014   Chronic left PICA infarct unchanged  New areas of low-density in the left frontal lobe and left temporal lobe not present on the prior studies. This could represent subacute infarct however vasogenic edema from tumor is in the differential. Follow-up MRI brain with contrast recommended for further evaluation.    Mr Brain Wo Contrast 06/30/2014  Representing a significant change from the relatively recent MR (04/07/2014) is the presence of prominent vasogenic edema within the anterior superior left frontal lobe and the anterior to mid left temporal lobe. Along the periphery are scattered blood breakdown products with focal 9 mm hematoma superior anterior left frontal lobe. Etiology is indeterminate. Mild local mass effect with slight inferior displacement of the left lateral ventricle.  This may reflect changes of amyloid angiography. Cortical vein thrombosis could cause a similar appearance although the major dural sinuses are patent. Metastatic disease is not entirely excluded.  Patient would benefit from contrast-enhanced imaging to exclude underlying lesion in addition to followup imaging to help demonstrate  that the vasogenic clears as would  be expected with a benign process.  No acute thrombotic infarct.  Remote partially hemorrhagic infarct mid to inferior left cerebellum.    Mr Jeri Cos Contrast 07/01/2014  Susceptibility artifact, sub cm LEFT frontal subacute hematoma, multifocal parenchymal edema and faint enhancement, constellation of findings are most consistent with amyloid angiography without suspicious intracranial mass.  Subacute to chronic appearing LEFT posterior inferior cerebellar artery territory infarct.       Discharge Exam: Filed Vitals:   07/02/14 0440  BP: 156/72  Pulse: 56  Temp:   Resp: 20   Filed Vitals:   07/01/14 0833 07/01/14 1449 07/01/14 2141 07/02/14 0440  BP: 179/63 183/66 184/70 156/72  Pulse: 49 50 60 56  Temp: 97.8 F (36.6 C) 97.7 F (36.5 C) 97.5 F (36.4 C)   TempSrc: Oral  Oral   Resp: 16 18 20 20   Height: 5\' 3"  (1.6 m)     Weight: 51.755 kg (114 lb 1.6 oz)     SpO2: 97% 98% 97% 98%    General: Pt is alert, follows commands appropriately, not in acute distress Cardiovascular: Regular rate and rhythm, S1/S2 +, no murmurs, no rubs, no gallops Respiratory: Clear to auscultation bilaterally, no wheezing, no crackles, no rhonchi Abdominal: Soft, non tender, non distended, bowel sounds +, no guarding  Discharge Instructions  Discharge Instructions    Diet - low sodium heart healthy    Complete by:  As directed      Increase activity slowly    Complete by:  As directed             Medication List    TAKE these medications        alendronate 70 MG tablet  Commonly known as:  FOSAMAX  Take 1 tablet (70 mg total) by mouth once a week.     atorvastatin 80 MG tablet  Commonly known as:  LIPITOR  Take 1 tablet (80 mg total) by mouth every morning.     Calcium Carbonate-Vitamin D 600-400 MG-UNIT per tablet  Take 1 tablet by mouth 2 (two) times daily.     clopidogrel 75 MG tablet  Commonly known as:  PLAVIX  Take 1 tablet (75 mg total) by mouth daily.     FLUoxetine 20  MG tablet  Commonly known as:  PROZAC  Take 1 tablet (20 mg total) by mouth daily.     lisinopril 10 MG tablet  Commonly known as:  PRINIVIL,ZESTRIL  Take 1 tablet (10 mg total) by mouth 2 (two) times daily.     metoprolol succinate 25 MG 24 hr tablet  Commonly known as:  TOPROL-XL  Take 1 tablet (25 mg total) by mouth daily.     multivitamin with minerals Tabs tablet  Take 1 tablet by mouth daily.     ranitidine 150 MG tablet  Commonly known as:  ZANTAC  Take 1 tablet (150 mg total) by mouth at bedtime.     vitamin B-12 1000 MCG tablet  Commonly known as:  CYANOCOBALAMIN  Take 1,000 mcg by mouth daily.            Follow-up Information    Follow up with [name], NP.   Specialty:  Internal Medicine   Contact information:   [address] [place] [address] [phone]       Call Faye Ramsay, MD.   Specialty:  Internal Medicine   Why:  As needed call my cell  phone [phone]   Contact information:   [address] [place] [address] [phone]        The results of significant diagnostics from this hospitalization (including imaging, microbiology, ancillary and laboratory) are listed below for reference.     Microbiology: No results found for this or any previous visit (from the past 240 hour(s)).   Labs: Basic Metabolic Panel:  Recent Labs Lab 07/01/14 0041 07/02/14 0630  NA 133* 134*  K 3.6 4.1  CL 97* 102  CO2 25 26  GLUCOSE 93 97  BUN 11 7  CREATININE 0.98 0.90  CALCIUM 9.6 8.8*   Liver Function Tests:  Recent Labs Lab 07/01/14 0041 07/02/14 0630  AST 24 25  ALT 25 26  ALKPHOS 56 57  BILITOT 0.6 0.8  PROT 6.4* 6.4*  ALBUMIN 3.8 3.5   CBC:  Recent Labs Lab 07/01/14 0041 07/02/14 0630  WBC 5.6 6.4  NEUTROABS 3.3  --   HGB 12.6 12.9  HCT 35.8* 36.5  MCV 90.4 90.3  PLT 208 226   Cardiac Enzymes:  Recent Labs Lab 07/01/14 0041  TROPONINI <0.03    SIGNED: Time coordinating discharge: 30 minutes  Faye Ramsay, MD  [facility] 07/02/2014, 1:22 PM Pager [phone]  If 7PM-7AM, please contact night-coverage [email or web address] Password [email or web address]

## 2014-07-02 NOTE — Progress Notes (Signed)
Pt discharging to home, IV dc'd, Vitals stable. Discussed pt education and discharge with pt and daughter. All questions addressed. Pt to vehicle via W/C and staff.

## 2014-07-05 ENCOUNTER — Telehealth: Payer: Self-pay | Admitting: *Deleted

## 2014-07-05 ENCOUNTER — Telehealth: Payer: Self-pay | Admitting: Family

## 2014-07-05 ENCOUNTER — Other Ambulatory Visit: Payer: Medicare Other

## 2014-07-05 NOTE — Telephone Encounter (Signed)
Scheduled with Mackie Pai, PA 07/14/14.

## 2014-07-05 NOTE — Telephone Encounter (Signed)
Unable to reach patient at time of TCM Call. Left message for patient to return call when available.  

## 2014-07-05 NOTE — Telephone Encounter (Signed)
Please contact pt to arrange TCM follow up with me.

## 2014-07-05 NOTE — Telephone Encounter (Signed)
Transition Care Management Follow-up Telephone Call  How have you been since you were released from the hospital? Spoke with patient's daughter who states that she is doing OK, no pain, but requiring a lot of assistance and supervision at home     Do you understand why you were in the hospital? "They could see on the CT scan that she is has a spot on her brain but they can't do anything about it"    Do you understand the discharge instrcutions? YES    Items Reviewed:  Medications reviewed: YES- no changes   Allergies reviewed: YES - no changes   Dietary changes reviewed:  YES- no changes, heart healthy diet   Referrals reviewed: YES- home health nurse is coming to assess patient's safety- to see if she will be appropriate for assisted living or whether she should go to memory care unit    Functional Questionnaire:   Activities of Daily Living (ADLs):   She states they are independent in the following: toileting/contenance   States they require assistance with the following: bathing, meals, ambulating, dressing, transportation, medication   Any transportation issues/concerns?: NO- family can drive patient to appointment    Any patient concerns? YES- patient is requiring constant supervision and assistance at home which is getting to be more than the family can provide- home health nurse is coming to assess patient's safety- to see if she will be appropriate for assisted living or whether she should go to memory care unit.  The daughter states she left a pot on the stove for one minute and her mother was already going for it.     Confirmed importance and date/time of follow-up visits scheduled: YES- scheduled with Mackie Pai, PA on 07/14/14.    Confirmed with patient if condition begins to worsen call PCP or go to the ER.  Patient was given the Call-a-Nurse line [phone]: YES

## 2014-07-06 ENCOUNTER — Telehealth: Payer: Self-pay | Admitting: Family

## 2014-07-06 NOTE — Telephone Encounter (Signed)
Caller name: Bolling,[name] Relation to TO:[email or web address]  Call back number: [phone]    Reason for call:   FYI-  Daughter wanted to inform; [facility] [place] will be faxing over [facility] form today 07/06/14. Daughter states she would like pt to move in Tuesday July 5th.

## 2014-07-07 DIAGNOSIS — Z7689 Persons encountering health services in other specified circumstances: Secondary | ICD-10-CM

## 2014-07-07 NOTE — Telephone Encounter (Signed)
FYI

## 2014-07-07 NOTE — Telephone Encounter (Signed)
[name], son in law([name]'s husband) is wanting to know if this is the right course of action to be taking? They want to know if patient should transition over to nursing home completely or if [name] should still be her provider. Ok to call [facility] regarding this. Best time to call is after 2:30pm. [phone] or [phone]

## 2014-07-07 NOTE — Telephone Encounter (Signed)
Form signed.

## 2014-07-07 NOTE — Telephone Encounter (Signed)
Received, filled out as much as possible, forwarded to [facility]. JG//CMA

## 2014-07-08 ENCOUNTER — Encounter (HOSPITAL_COMMUNITY): Payer: Self-pay | Admitting: Emergency Medicine

## 2014-07-08 ENCOUNTER — Emergency Department (HOSPITAL_COMMUNITY): Payer: Medicare Other

## 2014-07-08 ENCOUNTER — Emergency Department (HOSPITAL_COMMUNITY)
Admission: EM | Admit: 2014-07-08 | Discharge: 2014-07-08 | Disposition: A | Discharge status: Home / Self Care | Payer: Medicare Other | Source: Home / Self Care | Attending: Emergency Medicine | Admitting: Emergency Medicine

## 2014-07-08 DIAGNOSIS — Z87891 Personal history of nicotine dependence: Secondary | ICD-10-CM

## 2014-07-08 DIAGNOSIS — R296 Repeated falls: Secondary | ICD-10-CM | POA: Diagnosis present

## 2014-07-08 DIAGNOSIS — Z66 Do not resuscitate: Secondary | ICD-10-CM | POA: Diagnosis present

## 2014-07-08 DIAGNOSIS — M25551 Pain in right hip: Secondary | ICD-10-CM

## 2014-07-08 DIAGNOSIS — M810 Age-related osteoporosis without current pathological fracture: Secondary | ICD-10-CM

## 2014-07-08 DIAGNOSIS — Y92002 Bathroom of unspecified non-institutional (private) residence single-family (private) house as the place of occurrence of the external cause: Secondary | ICD-10-CM | POA: Insufficient documentation

## 2014-07-08 DIAGNOSIS — G8929 Other chronic pain: Secondary | ICD-10-CM

## 2014-07-08 DIAGNOSIS — K219 Gastro-esophageal reflux disease without esophagitis: Secondary | ICD-10-CM

## 2014-07-08 DIAGNOSIS — S299XXA Unspecified injury of thorax, initial encounter: Secondary | ICD-10-CM

## 2014-07-08 DIAGNOSIS — I129 Hypertensive chronic kidney disease with stage 1 through stage 4 chronic kidney disease, or unspecified chronic kidney disease: Secondary | ICD-10-CM | POA: Insufficient documentation

## 2014-07-08 DIAGNOSIS — Z9049 Acquired absence of other specified parts of digestive tract: Secondary | ICD-10-CM | POA: Diagnosis present

## 2014-07-08 DIAGNOSIS — Z9889 Other specified postprocedural states: Secondary | ICD-10-CM | POA: Insufficient documentation

## 2014-07-08 DIAGNOSIS — E854 Organ-limited amyloidosis: Secondary | ICD-10-CM | POA: Diagnosis present

## 2014-07-08 DIAGNOSIS — Z87448 Personal history of other diseases of urinary system: Secondary | ICD-10-CM | POA: Insufficient documentation

## 2014-07-08 DIAGNOSIS — G629 Polyneuropathy, unspecified: Secondary | ICD-10-CM | POA: Diagnosis present

## 2014-07-08 DIAGNOSIS — Z8673 Personal history of transient ischemic attack (TIA), and cerebral infarction without residual deficits: Secondary | ICD-10-CM | POA: Insufficient documentation

## 2014-07-08 DIAGNOSIS — Z880 Allergy status to penicillin: Secondary | ICD-10-CM | POA: Insufficient documentation

## 2014-07-08 DIAGNOSIS — F329 Major depressive disorder, single episode, unspecified: Secondary | ICD-10-CM

## 2014-07-08 DIAGNOSIS — F0390 Unspecified dementia without behavioral disturbance: Secondary | ICD-10-CM | POA: Insufficient documentation

## 2014-07-08 DIAGNOSIS — Z90710 Acquired absence of both cervix and uterus: Secondary | ICD-10-CM

## 2014-07-08 DIAGNOSIS — F419 Anxiety disorder, unspecified: Secondary | ICD-10-CM

## 2014-07-08 DIAGNOSIS — Z86718 Personal history of other venous thrombosis and embolism: Secondary | ICD-10-CM | POA: Insufficient documentation

## 2014-07-08 DIAGNOSIS — I680 Cerebral amyloid angiopathy: Secondary | ICD-10-CM | POA: Diagnosis present

## 2014-07-08 DIAGNOSIS — Y9389 Activity, other specified: Secondary | ICD-10-CM | POA: Insufficient documentation

## 2014-07-08 DIAGNOSIS — W19XXXA Unspecified fall, initial encounter: Secondary | ICD-10-CM

## 2014-07-08 DIAGNOSIS — E785 Hyperlipidemia, unspecified: Secondary | ICD-10-CM

## 2014-07-08 DIAGNOSIS — R627 Adult failure to thrive: Secondary | ICD-10-CM | POA: Diagnosis present

## 2014-07-08 DIAGNOSIS — Z91040 Latex allergy status: Secondary | ICD-10-CM | POA: Insufficient documentation

## 2014-07-08 DIAGNOSIS — N183 Chronic kidney disease, stage 3 (moderate): Secondary | ICD-10-CM | POA: Diagnosis present

## 2014-07-08 DIAGNOSIS — H9190 Unspecified hearing loss, unspecified ear: Secondary | ICD-10-CM | POA: Diagnosis present

## 2014-07-08 DIAGNOSIS — S79911A Unspecified injury of right hip, initial encounter: Secondary | ICD-10-CM

## 2014-07-08 DIAGNOSIS — Z79899 Other long term (current) drug therapy: Secondary | ICD-10-CM

## 2014-07-08 DIAGNOSIS — W1839XA Other fall on same level, initial encounter: Secondary | ICD-10-CM

## 2014-07-08 DIAGNOSIS — R531 Weakness: Secondary | ICD-10-CM | POA: Diagnosis not present

## 2014-07-08 DIAGNOSIS — G9340 Encephalopathy, unspecified: Secondary | ICD-10-CM | POA: Diagnosis present

## 2014-07-08 DIAGNOSIS — M549 Dorsalgia, unspecified: Secondary | ICD-10-CM | POA: Diagnosis present

## 2014-07-08 DIAGNOSIS — R1310 Dysphagia, unspecified: Secondary | ICD-10-CM | POA: Diagnosis present

## 2014-07-08 DIAGNOSIS — Z515 Encounter for palliative care: Secondary | ICD-10-CM

## 2014-07-08 DIAGNOSIS — Y998 Other external cause status: Secondary | ICD-10-CM

## 2014-07-08 DIAGNOSIS — Z7902 Long term (current) use of antithrombotics/antiplatelets: Secondary | ICD-10-CM

## 2014-07-08 MED ORDER — ACETAMINOPHEN 325 MG PO TABS
650.0000 mg | ORAL_TABLET | Freq: Once | ORAL | Status: AC
  Administered 2014-07-08: 650 mg via ORAL
  Filled 2014-07-08: qty 2

## 2014-07-08 NOTE — ED Provider Notes (Signed)
CSN: [id number]     Arrival date & time 07/08/14  1123 History   First MD Initiated Contact with Patient 07/08/14 1130     Chief Complaint  Patient presents with  . Fall  hip pain/fall   (Consider location/radiation/quality/duration/timing/severity/associated sxs/prior Treatment) The history is provided by the patient, a relative and the EMS personnel. No language interpreter was used.   Level V caviate secondary to dementia.  [name] is a 81 year old female with a history of hypertension, hyperlipidemia, CVA, CKD stage III, memory loss and confusion/dementia who presents for right hip and rib pain after unwitnessed fall in the bathroom while trying to pick up something. She is currently anticoagulated with Plavix. Per granddaughter she was ambulatory with help of the grandson and was walking with the walker as she normally does before and after the incident. She denies any head injury or loss of consciousness. She lives at home with her husband. She denies any recent illness, shortness of breath, abdominal pain, nausea, vomiting. Past Medical History  Diagnosis Date  . Hypertension   . Hyperlipemia   . Cerebral hemorrhage, nontraumatic 2011  . Ureterocele, acquired   . Broken arm     left X 2; right X 1; 1 OR" (07/01/2014)  . Brain swelling 2016  . History of pubovaginal sling   . Depression   . Memory loss   . Confusion   . Falls frequently   . Hearing loss   . Hyperlipemia   . DVT (deep venous thrombosis)     "she has one behind her left knee" (07/01/2014)  . GERD (gastroesophageal reflux disease)   . History of hiatal hernia   . Stroke 2015 & 2016    Dec; Jan, blockage cerebellum; "balance issues; cognition issues since" (07/01/2014)  . CVA (cerebral vascular accident) 2011    left cerebellum  . Osteoporosis   . Collapsed vertebra     "one in her lower and upper back" (07/01/2014)  . Chronic back pain     "w/activity" (07/01/2014)  . Anxiety   . Chronic kidney disease  (CKD), stage III (moderate)   . Dementia     "since 2011 CVA; due to micro hemorrhages"    Past Surgical History  Procedure Laterality Date  . Cholecystectomy    . Fracture surgery    . Bladder suspension  2006  . Forearm fracture surgery Left 1980's  . Abdominal hysterectomy      "partial"   Family History  Problem Relation Age of Onset  . Stroke Mother   . Diabetes Mother   . Hyperlipidemia Mother   . Hypertension Mother   . Stroke Father   . Heart disease Father   . Hyperlipidemia Father   . Depression Sister   . Cancer Brother 40    lung   History  Substance Use Topics  . Smoking status: Former Smoker -- 1.00 packs/day for 40 years    Types: Cigarettes  . Smokeless tobacco: Never Used     Comment: "stopped smoking in the 1990's"  . Alcohol Use: No   OB History    No data available     Review of Systems  Unable to perform ROS: Dementia      Allergies  Penicillins; Adhesive; and Latex  Home Medications   Prior to Admission medications   Medication Sig Start Date End Date Taking? Authorizing Provider  alendronate (FOSAMAX) 70 MG tablet Take 1 tablet (70 mg total) by mouth once a week. Patient taking differently: Take  70 mg by mouth once a week. On Thursdays 03/31/14  Yes Debbrah Alar, NP  atorvastatin (LIPITOR) 80 MG tablet Take 1 tablet (80 mg total) by mouth every morning. 03/31/14  Yes Debbrah Alar, NP  Calcium Carbonate-Vitamin D 600-400 MG-UNIT per tablet Take 1 tablet by mouth 2 (two) times daily.   Yes Historical Provider, MD  clopidogrel (PLAVIX) 75 MG tablet Take 1 tablet (75 mg total) by mouth daily. 02/23/14  Yes Daniel J Angiulli, PA-C  FLUoxetine (PROZAC) 20 MG tablet Take 1 tablet (20 mg total) by mouth daily. 05/02/14  Yes Debbrah Alar, NP  lisinopril (PRINIVIL,ZESTRIL) 10 MG tablet Take 1 tablet (10 mg total) by mouth 2 (two) times daily. 06/27/14  Yes Debbrah Alar, NP  metoprolol succinate (TOPROL-XL) 25 MG 24 hr tablet  Take 1 tablet (25 mg total) by mouth daily. 06/13/14  Yes Debbrah Alar, NP  Multiple Vitamin (MULTIVITAMIN WITH MINERALS) TABS Take 1 tablet by mouth daily.   Yes Historical Provider, MD  ranitidine (ZANTAC) 150 MG tablet Take 1 tablet (150 mg total) by mouth at bedtime. Patient taking differently: Take 150 mg by mouth daily as needed for heartburn.  04/15/14  Yes Brunetta Jeans, PA-C  vitamin B-12 (CYANOCOBALAMIN) 1000 MCG tablet Take 1,000 mcg by mouth 2 (two) times daily.    Yes Historical Provider, MD   BP 173/79 mmHg  Pulse 52  Temp(Src) 97.7 F (36.5 C) (Oral)  Resp 16  SpO2 100% Physical Exam  Constitutional: She is oriented to person, place, and time. She appears well-developed and well-nourished.  Resting comfortably and in no acute distress.  HENT:  Head: Normocephalic and atraumatic. Head is without raccoon's eyes, without Battle's sign, without abrasion, without contusion, without laceration, without right periorbital erythema and without left periorbital erythema. Hair is normal.  No signs of head trauma.  Eyes: Conjunctivae are normal.  Neck: Normal range of motion. Neck supple.  Cardiovascular: Normal rate, regular rhythm and normal heart sounds.   Pulmonary/Chest: Effort normal and breath sounds normal. No accessory muscle usage. No respiratory distress. She has no decreased breath sounds. She has no wheezes. She has no rales. She exhibits tenderness.  Right lower rib tenderness. No deformity. No flail chest. No difficulty breathing or shortness of breath.  Abdominal: Soft. She exhibits no distension. There is no tenderness. There is no rebound and no guarding.  Musculoskeletal: Normal range of motion.  No leg shortening. Able to flex and extend bilateral knees. Her pelvis is stable. She has pain when flexing the right hip but is able to fully flex and extend. Good bilateral DP pulses. No ecchymosis, erythema, edema to the right hip or leg or ribs.  Neurological: She is  alert and oriented to person, place, and time.  Skin: Skin is warm and dry.  Psychiatric: She has a normal mood and affect. Her behavior is normal.  Nursing note and vitals reviewed.   ED Course  Procedures (including critical care time) Labs Review Labs Reviewed - No data to display  Imaging Review Dg Ribs Unilateral W/chest Right  07/08/2014   CLINICAL DATA:  Fell Pain in Rigfht ribs( region of lateral midribs near bottom of right breastand right hip Near right iliac wing  EXAM: RIGHT RIBS AND CHEST - 3+ VIEW  COMPARISON:  07/01/2014  FINDINGS: No rib fracture or rib lesion.  Clear lungs.  No pleural effusion or pneumothorax.  Cardiac silhouette is normal in size and configuration. No mediastinal or hilar masses or evidence of  adenopathy.  IMPRESSION: 1. No rib fracture or rib lesion. 2. No acute cardiopulmonary disease.   Electronically Signed   By: Lajean Manes M.D.   On: 07/08/2014 12:27   Dg Hip Unilat With Pelvis 2-3 Views Right  07/08/2014   CLINICAL DATA:  Fall, right pelvic pain near iliac wing.  EXAM: RIGHT HIP (WITH PELVIS) 2-3 VIEWS  COMPARISON:  None.  FINDINGS: No acute bony abnormality. Specifically, no fracture, subluxation, or dislocation. Soft tissues are intact. Early joint space narrowing in the hip joints bilaterally. SI joints are symmetric and unremarkable.  IMPRESSION: No acute bony abnormality.   Electronically Signed   By: Rolm Baptise M.D.   On: 07/08/2014 12:29     EKG Interpretation None      MDM   Final diagnoses:  Fall, initial encounter  Right hip pain  Her vitals are stable and x-ray of the right hip and right ribs is negative for acute fracture. Medications  acetaminophen (TYLENOL) tablet 650 mg (650 mg Oral Given 07/08/14 1329)   Recheck: Patient is in no acute distress. She is resting comfortably. Patient was able to ambulate to the bathroom using walker. I feel comfortable discharging the patient home in the care of her husband and sister.I  discussed following up with her primary care physician and she verbally agrees with the plan as well as his sister.    Ottie Glazier, PA-C 07/08/14 1443  Nat Christen, MD 07/08/14 1601

## 2014-07-08 NOTE — ED Notes (Addendum)
EMS - Patient coming from home with c/o of right hip pain after having an unwitnessed fall in the bathroom.  Patient was able to ambulate with assistance to the living room and the use of a walker.  Patient is on blood thinners and hx of dementia.  No evidence of shortening or rotation of the right leg.  Pain is 5/10.

## 2014-07-08 NOTE — Telephone Encounter (Signed)
Form given to Lamount Cohen for processing.

## 2014-07-08 NOTE — Telephone Encounter (Signed)
Im aware, spoke with someone form there this morning.  Forms are with Percell Miller and [name], will fax when I have them. I can do nothing further to speed up the process. Both providers are off today.

## 2014-07-08 NOTE — Discharge Instructions (Signed)
Fall Prevention and Home Safety Take Tylenol for pain. Follow-up with her primary care doctor in 2 days. Continue using walker to ambulate. Falls cause injuries and can affect all age groups. It is possible to use preventive measures to significantly decrease the likelihood of falls. There are many simple measures which can make your home safer and prevent falls. OUTDOORS  Repair cracks and edges of walkways and driveways.  Remove high doorway thresholds.  Trim shrubbery on the main path into your home.  Have good outside lighting.  Clear walkways of tools, rocks, debris, and clutter.  Check that handrails are not broken and are securely fastened. Both sides of steps should have handrails.  Have leaves, snow, and ice cleared regularly.  Use sand or salt on walkways during winter months.  In the garage, clean up grease or oil spills. BATHROOM  Install night lights.  Install grab bars by the toilet and in the tub and shower.  Use non-skid mats or decals in the tub or shower.  Place a plastic non-slip stool in the shower to sit on, if needed.  Keep floors dry and clean up all water on the floor immediately.  Remove soap buildup in the tub or shower on a regular basis.  Secure bath mats with non-slip, double-sided rug tape.  Remove throw rugs and tripping hazards from the floors. BEDROOMS  Install night lights.  Make sure a bedside light is easy to reach.  Do not use oversized bedding.  Keep a telephone by your bedside.  Have a firm chair with side arms to use for getting dressed.  Remove throw rugs and tripping hazards from the floor. KITCHEN  Keep handles on pots and pans turned toward the center of the stove. Use back burners when possible.  Clean up spills quickly and allow time for drying.  Avoid walking on wet floors.  Avoid hot utensils and knives.  Position shelves so they are not too high or low.  Place commonly used objects within easy  reach.  If necessary, use a sturdy step stool with a grab bar when reaching.  Keep electrical cables out of the way.  Do not use floor polish or wax that makes floors slippery. If you must use wax, use non-skid floor wax.  Remove throw rugs and tripping hazards from the floor. STAIRWAYS  Never leave objects on stairs.  Place handrails on both sides of stairways and use them. Fix any loose handrails. Make sure handrails on both sides of the stairways are as long as the stairs.  Check carpeting to make sure it is firmly attached along stairs. Make repairs to worn or loose carpet promptly.  Avoid placing throw rugs at the top or bottom of stairways, or properly secure the rug with carpet tape to prevent slippage. Get rid of throw rugs, if possible.  Have an electrician put in a light switch at the top and bottom of the stairs. OTHER FALL PREVENTION TIPS  Wear low-heel or rubber-soled shoes that are supportive and fit well. Wear closed toe shoes.  When using a stepladder, make sure it is fully opened and both spreaders are firmly locked. Do not climb a closed stepladder.  Add color or contrast paint or tape to grab bars and handrails in your home. Place contrasting color strips on first and last steps.  Learn and use mobility aids as needed. Install an electrical emergency response system.  Turn on lights to avoid dark areas. Replace light bulbs that burn out immediately.  Get light switches that glow.  Arrange furniture to create clear pathways. Keep furniture in the same place.  Firmly attach carpet with non-skid or double-sided tape.  Eliminate uneven floor surfaces.  Select a carpet pattern that does not visually hide the edge of steps.  Be aware of all pets. OTHER HOME SAFETY TIPS  Set the water temperature for 120 F (48.8 C).  Keep emergency numbers on or near the telephone.  Keep smoke detectors on every level of the home and near sleeping areas. Document Released:  12/14/2001 Document Revised: 06/25/2011 Document Reviewed: 03/15/2011 [facility] Patient Information 2015 [place], [place]. This information is not intended to replace advice given to you by your health care provider. Make sure you discuss any questions you have with your health care provider.

## 2014-07-08 NOTE — Telephone Encounter (Signed)
Forms faxed successfully. Sent for scanning. JG//CMA

## 2014-07-08 NOTE — ED Notes (Signed)
Pt. able to ambulate into hallway and back to room with walker and assistance.

## 2014-07-08 NOTE — ED Notes (Signed)
Pt is in stable condition upon d/c and is escorted from ED by staff via wheelchair.

## 2014-07-08 NOTE — Telephone Encounter (Signed)
Caller name: °Relationship to patient: °Can be reached: °Pharmacy: ° °Reason for call: ° °

## 2014-07-08 NOTE — Telephone Encounter (Signed)
Caller name: Ammie Ferrier Relationship to patient: daughter Can be reached:[phone]  Reason for call: [name] is calling regarding FL2 papers. [facility] states that forms faxed in need corrected.  #1 issue - Dr. Hazel Sams to [facility] dom[facility] on line 11 #2 issue - diet needs to be low cholesterol/low sat #3 issue - in the med section we need to include frequency of use  They are hoping to get pt (and her spouse, Dhanvi Boesen) moved in on 07/13/14 and all paperwork needs to be in for them to move.

## 2014-07-10 ENCOUNTER — Telehealth: Payer: Self-pay | Admitting: Family Medicine

## 2014-07-10 ENCOUNTER — Emergency Department (HOSPITAL_COMMUNITY): Payer: Medicare Other

## 2014-07-10 ENCOUNTER — Inpatient Hospital Stay (HOSPITAL_COMMUNITY)
Admission: EM | Admit: 2014-07-10 | Discharge: 2014-07-13 | DRG: 947 | Disposition: A | Discharge status: Skilled Nursing Facility | Payer: Medicare Other | Attending: Internal Medicine | Admitting: Internal Medicine

## 2014-07-10 ENCOUNTER — Encounter (HOSPITAL_COMMUNITY): Payer: Self-pay | Admitting: Adult Health

## 2014-07-10 DIAGNOSIS — E858 Other amyloidosis: Secondary | ICD-10-CM | POA: Diagnosis not present

## 2014-07-10 DIAGNOSIS — M25551 Pain in right hip: Secondary | ICD-10-CM | POA: Diagnosis present

## 2014-07-10 DIAGNOSIS — G629 Polyneuropathy, unspecified: Secondary | ICD-10-CM | POA: Diagnosis present

## 2014-07-10 DIAGNOSIS — F32A Depression, unspecified: Secondary | ICD-10-CM | POA: Diagnosis present

## 2014-07-10 DIAGNOSIS — I471 Supraventricular tachycardia, unspecified: Secondary | ICD-10-CM | POA: Diagnosis present

## 2014-07-10 DIAGNOSIS — R29818 Other symptoms and signs involving the nervous system: Secondary | ICD-10-CM | POA: Diagnosis present

## 2014-07-10 DIAGNOSIS — F329 Major depressive disorder, single episode, unspecified: Secondary | ICD-10-CM | POA: Diagnosis present

## 2014-07-10 DIAGNOSIS — E785 Hyperlipidemia, unspecified: Secondary | ICD-10-CM | POA: Diagnosis present

## 2014-07-10 DIAGNOSIS — R509 Fever, unspecified: Secondary | ICD-10-CM

## 2014-07-10 DIAGNOSIS — R52 Pain, unspecified: Secondary | ICD-10-CM | POA: Diagnosis not present

## 2014-07-10 DIAGNOSIS — Z90710 Acquired absence of both cervix and uterus: Secondary | ICD-10-CM | POA: Diagnosis not present

## 2014-07-10 DIAGNOSIS — E854 Organ-limited amyloidosis: Secondary | ICD-10-CM | POA: Diagnosis present

## 2014-07-10 DIAGNOSIS — M549 Dorsalgia, unspecified: Secondary | ICD-10-CM | POA: Diagnosis present

## 2014-07-10 DIAGNOSIS — R29898 Other symptoms and signs involving the musculoskeletal system: Secondary | ICD-10-CM | POA: Diagnosis not present

## 2014-07-10 DIAGNOSIS — E851 Neuropathic heredofamilial amyloidosis: Secondary | ICD-10-CM | POA: Diagnosis present

## 2014-07-10 DIAGNOSIS — R531 Weakness: Secondary | ICD-10-CM | POA: Diagnosis present

## 2014-07-10 DIAGNOSIS — F0391 Unspecified dementia with behavioral disturbance: Secondary | ICD-10-CM | POA: Diagnosis not present

## 2014-07-10 DIAGNOSIS — R109 Unspecified abdominal pain: Secondary | ICD-10-CM | POA: Diagnosis present

## 2014-07-10 DIAGNOSIS — R1310 Dysphagia, unspecified: Secondary | ICD-10-CM | POA: Diagnosis present

## 2014-07-10 DIAGNOSIS — F419 Anxiety disorder, unspecified: Secondary | ICD-10-CM | POA: Diagnosis present

## 2014-07-10 DIAGNOSIS — Z7189 Other specified counseling: Secondary | ICD-10-CM | POA: Diagnosis not present

## 2014-07-10 DIAGNOSIS — R296 Repeated falls: Secondary | ICD-10-CM | POA: Diagnosis present

## 2014-07-10 DIAGNOSIS — G9340 Encephalopathy, unspecified: Secondary | ICD-10-CM | POA: Diagnosis present

## 2014-07-10 DIAGNOSIS — R10A1 Flank pain, right side: Secondary | ICD-10-CM | POA: Diagnosis present

## 2014-07-10 DIAGNOSIS — Z880 Allergy status to penicillin: Secondary | ICD-10-CM | POA: Diagnosis not present

## 2014-07-10 DIAGNOSIS — R627 Adult failure to thrive: Secondary | ICD-10-CM | POA: Diagnosis present

## 2014-07-10 DIAGNOSIS — I129 Hypertensive chronic kidney disease with stage 1 through stage 4 chronic kidney disease, or unspecified chronic kidney disease: Secondary | ICD-10-CM | POA: Diagnosis present

## 2014-07-10 DIAGNOSIS — H9190 Unspecified hearing loss, unspecified ear: Secondary | ICD-10-CM

## 2014-07-10 DIAGNOSIS — Z515 Encounter for palliative care: Secondary | ICD-10-CM

## 2014-07-10 DIAGNOSIS — G63 Polyneuropathy in diseases classified elsewhere: Secondary | ICD-10-CM

## 2014-07-10 DIAGNOSIS — Z8673 Personal history of transient ischemic attack (TIA), and cerebral infarction without residual deficits: Secondary | ICD-10-CM | POA: Diagnosis not present

## 2014-07-10 DIAGNOSIS — N183 Chronic kidney disease, stage 3 (moderate): Secondary | ICD-10-CM | POA: Diagnosis present

## 2014-07-10 DIAGNOSIS — Z7902 Long term (current) use of antithrombotics/antiplatelets: Secondary | ICD-10-CM | POA: Diagnosis not present

## 2014-07-10 DIAGNOSIS — Z91040 Latex allergy status: Secondary | ICD-10-CM | POA: Diagnosis not present

## 2014-07-10 DIAGNOSIS — G8929 Other chronic pain: Secondary | ICD-10-CM | POA: Diagnosis present

## 2014-07-10 DIAGNOSIS — R269 Unspecified abnormalities of gait and mobility: Secondary | ICD-10-CM

## 2014-07-10 DIAGNOSIS — Z87891 Personal history of nicotine dependence: Secondary | ICD-10-CM | POA: Diagnosis not present

## 2014-07-10 DIAGNOSIS — I619 Nontraumatic intracerebral hemorrhage, unspecified: Secondary | ICD-10-CM | POA: Diagnosis not present

## 2014-07-10 DIAGNOSIS — I680 Cerebral amyloid angiopathy: Secondary | ICD-10-CM | POA: Diagnosis present

## 2014-07-10 DIAGNOSIS — I63542 Cerebral infarction due to unspecified occlusion or stenosis of left cerebellar artery: Secondary | ICD-10-CM | POA: Diagnosis present

## 2014-07-10 DIAGNOSIS — Z79899 Other long term (current) drug therapy: Secondary | ICD-10-CM | POA: Diagnosis not present

## 2014-07-10 DIAGNOSIS — I10 Essential (primary) hypertension: Secondary | ICD-10-CM | POA: Diagnosis present

## 2014-07-10 DIAGNOSIS — Z66 Do not resuscitate: Secondary | ICD-10-CM | POA: Diagnosis present

## 2014-07-10 DIAGNOSIS — K219 Gastro-esophageal reflux disease without esophagitis: Secondary | ICD-10-CM | POA: Diagnosis present

## 2014-07-10 DIAGNOSIS — F0390 Unspecified dementia without behavioral disturbance: Secondary | ICD-10-CM | POA: Diagnosis present

## 2014-07-10 DIAGNOSIS — Z9049 Acquired absence of other specified parts of digestive tract: Secondary | ICD-10-CM | POA: Diagnosis present

## 2014-07-10 HISTORY — DX: Chronic lymphocytic leukemia of B-cell type not having achieved remission: C91.10

## 2014-07-10 LAB — URINALYSIS, ROUTINE W REFLEX MICROSCOPIC
BILIRUBIN URINE: NEGATIVE
GLUCOSE, UA: NEGATIVE mg/dL
Hgb urine dipstick: NEGATIVE
KETONES UR: NEGATIVE mg/dL
Leukocytes, UA: NEGATIVE
Nitrite: NEGATIVE
PROTEIN: NEGATIVE mg/dL
SPECIFIC GRAVITY, URINE: 1.012 (ref 1.005–1.030)
Urobilinogen, UA: 0.2 mg/dL (ref 0.0–1.0)
pH: 6.5 (ref 5.0–8.0)

## 2014-07-10 LAB — COMPREHENSIVE METABOLIC PANEL
ALBUMIN: 3.5 g/dL (ref 3.5–5.0)
ALK PHOS: 56 U/L (ref 38–126)
ALT: 25 U/L (ref 14–54)
AST: 24 U/L (ref 15–41)
Anion gap: 10 (ref 5–15)
BILIRUBIN TOTAL: 0.4 mg/dL (ref 0.3–1.2)
BUN: 11 mg/dL (ref 6–20)
CO2: 24 mmol/L (ref 22–32)
CREATININE: 0.96 mg/dL (ref 0.44–1.00)
Calcium: 9.2 mg/dL (ref 8.9–10.3)
Chloride: 98 mmol/L — ABNORMAL LOW (ref 101–111)
GFR calc Af Amer: >60 mL/min (ref >60)
GFR calc non Af Amer: 55 mL/min — ABNORMAL LOW (ref >60)
GLUCOSE: 101 mg/dL — AB (ref 65–99)
Potassium: 3.8 mmol/L (ref 3.5–5.1)
Sodium: 132 mmol/L — ABNORMAL LOW (ref 135–145)
Total Protein: 6.2 g/dL — ABNORMAL LOW (ref 6.5–8.1)

## 2014-07-10 LAB — CBC
HCT: 35.6 % — ABNORMAL LOW (ref 36.0–46.0)
Hemoglobin: 12.6 g/dL (ref 12.0–15.0)
MCH: 31.8 pg (ref 26.0–34.0)
MCHC: 35.4 g/dL (ref 30.0–36.0)
MCV: 89.9 fL (ref 78.0–100.0)
Platelets: 237 10*3/uL (ref 150–400)
RBC: 3.96 MIL/uL (ref 3.87–5.11)
RDW: 12.4 % (ref 11.5–15.5)
WBC: 5.5 10*3/uL (ref 4.0–10.5)

## 2014-07-10 LAB — CBG MONITORING, ED: Glucose-Capillary: 103 mg/dL — ABNORMAL HIGH (ref 65–99)

## 2014-07-10 MED ORDER — ACETAMINOPHEN 325 MG PO TABS
650.0000 mg | ORAL_TABLET | Freq: Once | ORAL | Status: AC
  Administered 2014-07-10: 650 mg via ORAL
  Filled 2014-07-10: qty 2

## 2014-07-10 NOTE — ED Notes (Signed)
Presents with altered mental mental status, unsteady gait that began after being discharged from hospital last Saturday. Family member reports that they are unable to handle her and she is so off balance she keeps falling-they are concerned for her safety and feel she has greatly declined.

## 2014-07-10 NOTE — ED Provider Notes (Signed)
CSN: [id number]     Arrival date & time 07/10/14  1811 History   First MD Initiated Contact with Patient 07/10/14 1934     Chief Complaint  Patient presents with  . Altered Mental Status   (Consider location/radiation/quality/duration/timing/severity/associated sxs/prior Treatment) Patient is a 81 y.o. female presenting with altered mental status. The history is provided by a relative and a caregiver. The history is limited by the condition of the patient.  Altered Mental Status Presenting symptoms: behavior changes, confusion and disorientation   Severity:  Moderate Most recent episode:  More than 2 days ago Episode history:  Continuous Timing:  Constant Progression:  Worsening Chronicity:  Chronic Context: dementia and head injury   Recent head injury:  Over 24 hours ago Associated symptoms: weakness   Associated symptoms: no eye deviation and no visual change     Past Medical History  Diagnosis Date  . Hypertension   . Hyperlipemia   . Cerebral hemorrhage, nontraumatic 2011  . Ureterocele, acquired   . Broken arm     left X 2; right X 1; 1 OR" (07/01/2014)  . Brain swelling 2016  . History of pubovaginal sling   . Depression   . Memory loss   . Confusion   . Falls frequently   . Hearing loss   . Hyperlipemia   . DVT (deep venous thrombosis)     "she has one behind her left knee" (07/01/2014)  . GERD (gastroesophageal reflux disease)   . History of hiatal hernia   . Stroke 2015 & 2016    Dec; Jan, blockage cerebellum; "balance issues; cognition issues since" (07/01/2014)  . CVA (cerebral vascular accident) 2011    left cerebellum  . Osteoporosis   . Collapsed vertebra     "one in her lower and upper back" (07/01/2014)  . Chronic back pain     "w/activity" (07/01/2014)  . Anxiety   . Chronic kidney disease (CKD), stage III (moderate)   . Dementia     "since 2011 CVA; due to micro hemorrhages"    Past Surgical History  Procedure Laterality Date  . Cholecystectomy     . Fracture surgery    . Bladder suspension  2006  . Forearm fracture surgery Left 1980's  . Abdominal hysterectomy      "partial"   Family History  Problem Relation Age of Onset  . Stroke Mother   . Diabetes Mother   . Hyperlipidemia Mother   . Hypertension Mother   . Stroke Father   . Heart disease Father   . Hyperlipidemia Father   . Depression Sister   . Cancer Brother 40    lung   History  Substance Use Topics  . Smoking status: Former Smoker -- 1.00 packs/day for 40 years    Types: Cigarettes  . Smokeless tobacco: Never Used     Comment: "stopped smoking in the 1990's"  . Alcohol Use: No   OB History    No data available     Review of Systems  Unable to perform ROS: Dementia  Cardiovascular: Positive for chest pain (right lateral).  Neurological: Positive for speech difficulty and weakness.  Psychiatric/Behavioral: Positive for behavioral problems, confusion and sleep disturbance. The patient is nervous/anxious.      Allergies  Penicillins; Adhesive; and Latex  Home Medications   Prior to Admission medications   Medication Sig Start Date End Date Taking? Authorizing Provider  acetaminophen (TYLENOL) 500 MG tablet Take 1,000 mg by mouth every 6 (six) hours as  needed for mild pain.   Yes Historical Provider, MD  alendronate (FOSAMAX) 70 MG tablet Take 1 tablet (70 mg total) by mouth once a week. 03/31/14  Yes Debbrah Alar, NP  atorvastatin (LIPITOR) 80 MG tablet Take 1 tablet (80 mg total) by mouth every morning. 03/31/14  Yes Debbrah Alar, NP  Calcium Carbonate-Vitamin D 600-400 MG-UNIT per tablet Take 1 tablet by mouth 2 (two) times daily.   Yes Historical Provider, MD  clopidogrel (PLAVIX) 75 MG tablet Take 1 tablet (75 mg total) by mouth daily. 02/23/14  Yes Daniel J Angiulli, PA-C  FLUoxetine (PROZAC) 20 MG tablet Take 1 tablet (20 mg total) by mouth daily. 05/02/14  Yes Debbrah Alar, NP  lisinopril (PRINIVIL,ZESTRIL) 10 MG tablet Take 1  tablet (10 mg total) by mouth 2 (two) times daily. 06/27/14  Yes Debbrah Alar, NP  metoprolol succinate (TOPROL-XL) 25 MG 24 hr tablet Take 1 tablet (25 mg total) by mouth daily. Patient taking differently: Take 25 mg by mouth at bedtime.  06/13/14  Yes Debbrah Alar, NP  Multiple Vitamin (MULTIVITAMIN WITH MINERALS) TABS Take 1 tablet by mouth daily.   Yes Historical Provider, MD  ranitidine (ZANTAC) 150 MG tablet Take 1 tablet (150 mg total) by mouth at bedtime. 04/15/14  Yes Brunetta Jeans, PA-C  vitamin B-12 (CYANOCOBALAMIN) 1000 MCG tablet Take 1,000 mcg by mouth 2 (two) times daily.    Yes Historical Provider, MD   BP 173/71 mmHg  Pulse 55  Temp(Src) 98.1 F (36.7 C) (Oral)  Resp 18  SpO2 97%  Physical Exam  Constitutional: Vital signs are normal. She appears well-developed and well-nourished. She is cooperative. She does not have a sickly appearance. No distress.  HENT:  Head: Normocephalic and atraumatic.  Right Ear: External ear normal.  Left Ear: External ear normal.  Nose: Nose normal.  Mouth/Throat: Oropharynx is clear and moist. No oropharyngeal exudate.  Eyes: EOM are normal. Pupils are equal, round, and reactive to light.  Neck: Normal range of motion. Neck supple.  Cardiovascular: Normal rate, regular rhythm, normal heart sounds and intact distal pulses.   No murmur heard. Pulmonary/Chest: Effort normal and breath sounds normal. No respiratory distress. She has no wheezes. She exhibits tenderness.  Tender to palp of right lower lateral chest wall.  No bruising or bony instability under.   Abdominal: Soft. There is no tenderness. There is no rebound and no guarding.  Musculoskeletal: Normal range of motion. She exhibits no tenderness.  Lymphadenopathy:    She has no cervical adenopathy.  Neurological: She is alert. No cranial nerve deficit.  Awake and alert.  Able to communicate but unable to give reliable history.  Oriented to name only.  Follows some commands  but requires redirection.  Decreased strength of RLE globally   Skin: Skin is warm and dry. She is not diaphoretic.  Psychiatric: Her speech is normal and behavior is normal. Her affect is blunt. Cognition and memory are impaired.  Nursing note and vitals reviewed.   ED Course  Procedures (including critical care time) Labs Review Labs Reviewed  CBC - Abnormal; Notable for the following:    HCT 35.6 (*)    All other components within normal limits  COMPREHENSIVE METABOLIC PANEL - Abnormal; Notable for the following:    Sodium 132 (*)    Chloride 98 (*)    Glucose, Bld 101 (*)    Total Protein 6.2 (*)    GFR calc non Af Amer 55 (*)    All  other components within normal limits  CBG MONITORING, ED - Abnormal; Notable for the following:    Glucose-Capillary 103 (*)    All other components within normal limits  URINE CULTURE  URINALYSIS, ROUTINE W REFLEX MICROSCOPIC (NOT AT [facility])  CBC  BASIC METABOLIC PANEL  PROTIME-INR  APTT    Imaging Review Dg Chest 2 View  07/10/2014   CLINICAL DATA:  Acute onset of altered mental status. Multiple falls. Initial encounter.  EXAM: CHEST  2 VIEW  COMPARISON:  Chest radiograph performed 07/01/2014  FINDINGS: The lungs are well-aerated. Minimal bilateral atelectasis is noted. There is no evidence of pleural effusion or pneumothorax.  The heart is mildly enlarged. No acute osseous abnormalities are seen.  IMPRESSION: Mild cardiomegaly; minimal bilateral atelectasis seen.   Electronically Signed   By: Garald Balding M.D.   On: 07/10/2014 21:25     EKG Interpretation   Date/Time:  Sunday July 10 2014 19:58:51 EDT Ventricular Rate:  50 PR Interval:  166 QRS Duration: 79 QT Interval:  465 QTC Calculation: 424 R Axis:   -12 Text Interpretation:  Sinus rhythm Consider right ventricular hypertrophy  ED PHYSICIAN INTERPRETATION AVAILABLE IN [facility] Confirmed by  TEST, Record ([id number]) on 07/11/2014 12:58:01 PM      MDM   Final diagnoses:   Failure to thrive in adult  Progressive neurologic decline    Pt is a 81 yo F with hx of multiple CVAs and brain amyloidosis, CKD, HTN, HLD who presents with family reporting progressive decline.   [place] 2 days ago and was seen in the ED.  Had pain at right lateral ribs and has complained of this since, although no underlying etiology was found.  Likely a rib contusion.   CT head and MRIs over the past month have shown left frontal and temporal subacute infarct, hematoma, and amyloid deposits; and remote infarct to left posterior inferior cerebellum.    Over the past few weeks, she has had progressive decline.  Previously was able to take care of some ADLs but with some limitations in ambulation (walked with walker, chronic right foot drop, and some decreased coordination with RLE) and decreased cognition/memory.  For the past few weeks she has been more confused and now has significantly decreased mobility.  She now has difficulty swallowing her food/pills, requires much more coaching by family to do her tasks, is no longer able to ambulate on her own due to confusion and worsening RLE control, and has been more confused that typically.  She is living at home with her daughter and son in law, but they are no longer able to take care of her now that she has declined.  She was initially set up to move to an assisted living facility next week but now wouldn't be able to function without 24 hour care.  Family tried to contact SNFs and hospices today but were unable to secure placement in a facility.  As it is a holiday weekend, they were significantly concerned that they wouldn't be able to get her anywhere for several days, so they presented to the ED.  They deny any new falls since her last ED visit.  She still has had moderate right lateral chest wall pain with movement, controlled with PRN tylenol.   Awake and alert.  Able to communicate but unable to give reliable history.  Oriented to name only.   Follows some commands but requires redirection.  Decreased strength of entire RLE but unable to get her to  cooperate with isolated muscle group testing appropriately.  Tender to palp at right lateral ribs.  No bruising, no crepitus, no bony deformity.    Given tylenol.  General labs, CXR, and urine sent to rule out underlying infectious cause.   Confirmed DNR/DNI with daughter who is her POA.  Discussed at length with family and they voice that they wish to pursue palliative measures and would not be interested in surgery even if it were life saving.  No indication that a CT brain would change this patient's course and the family reports they would not want to act on any emergent results if it were to find any, so no brain imaging was ordered.    Paged Education officer, museum, but unfortunately due to late hour on holiday weekend, there was noone on call.   Will need to admit to the hospital for further work up and help finding placement long term for her progressive decline from multiple strokes and falls.    Admitted to hospitalist, telemetry unit, Dr. Mora Bellman.    Tori Milks, MD 07/12/14 [id number]  Leonard Schwartz, MD 07/24/14 [phone]

## 2014-07-10 NOTE — ED Notes (Signed)
Hospitalist at the bedside 

## 2014-07-10 NOTE — Telephone Encounter (Addendum)
Speaking with daughter [name]  Reviewed last neurology note "Granddaughter is at bedside. They do not want any intervention. She just wants to take patient home. Patient has significant dementia at baseline with recent decline. She has sundowning and episodes of confusion at home. Coming to the hospital is difficult for patient and family. Patient lives with granddaughter. Family would not consider changing her medication and they would not authorize significant intervention for any new diagnosis. They just want patient comfortable at home. They follow with Vascular neurologist Dr. Erlinda Hong out patient. They will follow up in the office. Explained the likely cause of the edema and hemorrhage as amyloid angiopathy. Can't rule out metastatic disease given significant edema but again, they would not authorize intervention even if she did have an underlying lesion.  Agree, family can take patient home. Discussed with primary physician. Will see her outpatient. Patient is present in conversation and nods agreement. "  Diagnosed 2011 with dementia Major stroke in January- on plavix Hospitalized about a week ago with amyloid angiography as well as a sub centimeter left frontal subacute hematoma. Was told by neurologist "to take her home, keep her comfortable"  Reports rapid decline in health over last week Started to drag left foot heavily with walking Progressive difficulties with balance Couldn't figure out how to use steps Family physically carrying her in and out of home at this point  Had a fall on Friday- did not hit her head reportedly.   Since yesterday, has become wheelchair bound.  Significant assistance needed with transfers States she cannot get her right foot to move at times.  Word finding difficulties which are far worse than normal States she "chewed up all her pills" because she didn't know how to swallow them.   ________________________________________________________________________________________  [name] with hospice who stated most likely difficult to get patient in today, possible tomorrow Family not sure if they can wait that long Spoke with hospitalist who would admit for comfort care only, but if needs workup, needs ED visit On return call to family, they are unable to commit to comfort care without workup  They ultimately decide to wait until tomorrow to see if they can wait for hospice but if they decide, they cannot care for her- will return to [place] called back- they are to schedule and let family know. Family will go to ED if they do not think they can wait for hospice consult.

## 2014-07-10 NOTE — Telephone Encounter (Signed)
I received a call from team health when I did not have access to epic. Caller did not have access either. Described recent stroke and hsopitalization and return visit to ED yesterday. They state they were told in ED that "nothing else could be done" for patient and was discharged. It appears she had a fall on review of chart and had normal imaging.   Family describes progressive declines in function. I advised return visit to ED with recent history. Per team health, family is more interested in palliative or hospice approach and home hospice consult which I was not sure could be arranged on weekend (or on Monday). Family did not describe this progressive decline thoroughly.   If patient and family refuse ED visit, they are going to think over hospice option and call me in AM though as stated above, not clear this can be arranged this weekend.

## 2014-07-10 NOTE — H&P (Signed)
[facility] History and Physical  [name] JJK:[id number] DOB: March 28, 1933 DOA: 07/10/2014  Referring physician: ED physician PCP: [name], NP  Specialists:   Chief Complaint: Altered mental status, right leg weakness, right flank pain  HPI: [name] is a 81 y.o. female with PMH of hyperlipidemia, GERD, depression, anxiety, falls, hearing loss, DVT not on anticoagulating due to falls, stroke, back pain, chronic kidney disease-status 3, dementia, stroke, recent left frontal subacute hematoma, who presents with altered mental status, right leg weakness and right flank pain.  Patient is accompanied by her daughter. She has altered mental status, history is obtained from her daughter. Per daughter, over the past 2 weeks, patient has progressive decline. She becomes more confused. She seems to have right leg weakness and drags her right leg. She has significantly decreased mobility. She also complains of right flank pain.   Of note, she is living at home with her daughter and son in law, but they are no longer able to take care of her. She was initially set up to go to an assisted living facility but now wouldn't be able to function without 24 hour care. Family tried to contact SNFs and hospices today but were unable to secure placement in a facility. As it is a holiday weekend, they were significantly concerned that they wouldn't be able to get her anywhere for several days, so they presented to the ED.he can does not have chest pain, shortness of breath, cough, symptoms of her UTI.  In ED, patient was found to have WBC 5.5, negative urinalysis, temperature normal, bradycardia, negative chest x-ray. Patient is admitted to inpatient for further evaluation and treatment.  Where does patient live?   At home   Can patient participate in ADLs?   None  Review of Systems: unable to get due to AMS  Allergy:  Allergies  Allergen Reactions  . Penicillins     Reaction:  unknown  . Adhesive [Tape] Rash    Please use "paper" tape only.  . Latex Rash    Past Medical History  Diagnosis Date  . Hypertension   . Hyperlipemia   . Cerebral hemorrhage, nontraumatic 2011  . Ureterocele, acquired   . Broken arm     left X 2; right X 1; 1 OR" (07/01/2014)  . Brain swelling 2016  . History of pubovaginal sling   . Depression   . Memory loss   . Confusion   . Falls frequently   . Hearing loss   . Hyperlipemia   . DVT (deep venous thrombosis)     "she has one behind her left knee" (07/01/2014)  . GERD (gastroesophageal reflux disease)   . History of hiatal hernia   . Stroke 2015 & 2016    Dec; Jan, blockage cerebellum; "balance issues; cognition issues since" (07/01/2014)  . CVA (cerebral vascular accident) 2011    left cerebellum  . Osteoporosis   . Collapsed vertebra     "one in her lower and upper back" (07/01/2014)  . Chronic back pain     "w/activity" (07/01/2014)  . Anxiety   . Chronic kidney disease (CKD), stage III (moderate)   . Dementia     "since 2011 CVA; due to micro hemorrhages"     Past Surgical History  Procedure Laterality Date  . Cholecystectomy    . Fracture surgery    . Bladder suspension  2006  . Forearm fracture surgery Left 1980's  . Abdominal hysterectomy      "partial"  Social History:  reports that she has quit smoking. Her smoking use included Cigarettes. She has a 40 pack-year smoking history. She has never used smokeless tobacco. She reports that she does not drink alcohol or use illicit drugs.  Family History:  Family History  Problem Relation Age of Onset  . Stroke Mother   . Diabetes Mother   . Hyperlipidemia Mother   . Hypertension Mother   . Stroke Father   . Heart disease Father   . Hyperlipidemia Father   . Depression Sister   . Cancer Brother 40    lung     Prior to Admission medications   Medication Sig Start Date End Date Taking? Authorizing Provider  acetaminophen (TYLENOL) 500 MG tablet  Take 1,000 mg by mouth every 6 (six) hours as needed for mild pain.   Yes Historical Provider, MD  alendronate (FOSAMAX) 70 MG tablet Take 1 tablet (70 mg total) by mouth once a week. 03/31/14  Yes Debbrah Alar, NP  atorvastatin (LIPITOR) 80 MG tablet Take 1 tablet (80 mg total) by mouth every morning. 03/31/14  Yes Debbrah Alar, NP  Calcium Carbonate-Vitamin D 600-400 MG-UNIT per tablet Take 1 tablet by mouth 2 (two) times daily.   Yes Historical Provider, MD  clopidogrel (PLAVIX) 75 MG tablet Take 1 tablet (75 mg total) by mouth daily. 02/23/14  Yes Daniel J Angiulli, PA-C  FLUoxetine (PROZAC) 20 MG tablet Take 1 tablet (20 mg total) by mouth daily. 05/02/14  Yes Debbrah Alar, NP  lisinopril (PRINIVIL,ZESTRIL) 10 MG tablet Take 1 tablet (10 mg total) by mouth 2 (two) times daily. 06/27/14  Yes Debbrah Alar, NP  metoprolol succinate (TOPROL-XL) 25 MG 24 hr tablet Take 1 tablet (25 mg total) by mouth daily. Patient taking differently: Take 25 mg by mouth at bedtime.  06/13/14  Yes Debbrah Alar, NP  Multiple Vitamin (MULTIVITAMIN WITH MINERALS) TABS Take 1 tablet by mouth daily.   Yes Historical Provider, MD  ranitidine (ZANTAC) 150 MG tablet Take 1 tablet (150 mg total) by mouth at bedtime. 04/15/14  Yes Brunetta Jeans, PA-C  vitamin B-12 (CYANOCOBALAMIN) 1000 MCG tablet Take 1,000 mcg by mouth 2 (two) times daily.    Yes Historical Provider, MD    Physical Exam: Filed Vitals:   07/10/14 2300 07/10/14 2340 07/10/14 2359 07/11/14 0500  BP: 167/56  162/69 156/67  Pulse: 52  56 64  Temp:  98 F (36.7 C) 97.5 F (36.4 C) 97.6 F (36.4 C)  TempSrc:  Oral Oral Oral  Resp: 20  16 18   Weight:   62.778 kg (138 lb 6.4 oz)   SpO2: 94%  97% 97%   General: Not in acute distress HEENT:       Eyes: PERRL, EOMI, no scleral icterus.       ENT: No discharge from the ears and nose, no pharynx injection, no tonsillar enlargement.        Neck: No JVD, no bruit, no mass felt. Heme:  No neck lymph node enlargement. Cardiac: S1/S2, RRR, No murmurs, No gallops or rubs. Pulm:  No rales, wheezing, rhonchi or rubs. Abd: Soft, nondistended, nontender, no rebound pain, no organomegaly, BS present. There is significant tenderness over right flank area. Ext: No pitting leg edema bilaterally. 2+DP/PT pulse bilaterally. Musculoskeletal: No joint deformities, No joint redness or warmth, no limitation of ROM in spin. Skin: No rashes.  Neuro: Alert, not oriented X3, cranial nerves II-XII grossly intact, muscle strength 4/5 in right leg, and 5/5 in  all other extremities, sensation to light touch intact. Brachial reflex 1+ bilaterally. Knee reflex 1+ bilaterally. Negative Babinski's sign. Psych: Patient is not psychotic, no suicidal or hemocidal ideation.  Labs on Admission:  Basic Metabolic Panel:  Recent Labs Lab 07/10/14 1837 07/11/14 0448  NA 132* 132*  K 3.8 4.0  CL 98* 100*  CO2 24 25  GLUCOSE 101* 129*  BUN 11 10  CREATININE 0.96 1.05*  CALCIUM 9.2 9.1   Liver Function Tests:  Recent Labs Lab 07/10/14 1837  AST 24  ALT 25  ALKPHOS 56  BILITOT 0.4  PROT 6.2*  ALBUMIN 3.5   No results for input(s): LIPASE, AMYLASE in the last 168 hours. No results for input(s): AMMONIA in the last 168 hours. CBC:  Recent Labs Lab 07/10/14 1837 07/11/14 0448  WBC 5.5 7.6  HGB 12.6 12.6  HCT 35.6* 36.0  MCV 89.9 89.8  PLT 237 219   Cardiac Enzymes: No results for input(s): CKTOTAL, CKMB, CKMBINDEX, TROPONINI in the last 168 hours.  BNP (last 3 results) No results for input(s): BNP in the last 8760 hours.  ProBNP (last 3 results) No results for input(s): PROBNP in the last 8760 hours.  CBG:  Recent Labs Lab 07/10/14 1843  GLUCAP 103*    Radiological Exams on Admission: Dg Chest 2 View  07/10/2014   CLINICAL DATA:  Acute onset of altered mental status. Multiple falls. Initial encounter.  EXAM: CHEST  2 VIEW  COMPARISON:  Chest radiograph performed 07/01/2014   FINDINGS: The lungs are well-aerated. Minimal bilateral atelectasis is noted. There is no evidence of pleural effusion or pneumothorax.  The heart is mildly enlarged. No acute osseous abnormalities are seen.  IMPRESSION: Mild cardiomegaly; minimal bilateral atelectasis seen.   Electronically Signed   By: Garald Balding M.D.   On: 07/10/2014 21:25   Mr Brain Wo Contrast  07/11/2014   CLINICAL DATA:  Altered mental status, unsteady gait since discharge from hospital last Saturday. RIGHT leg weakness. History of stroke, hypertension, hyperlipidemia.  EXAM: MRI HEAD WITHOUT CONTRAST  TECHNIQUE: Multiplanar, multiecho pulse sequences of the brain and surrounding structures were obtained without intravenous contrast.  COMPARISON:  MRI of the brain July 01, 2014  FINDINGS: No reduced diffusion to suggest acute ischemia. Faint susceptibility artifact in spurious reduced diffusion in LEFT frontal lobe corresponding to sub cm evolving intraparenchymal hematoma. Innumerable punctate small foci of susceptibility artifact throughout the supratentorial and to lesser extent infratentorial brain, present on prior imaging. No midline shift, mass effect or mass lesions. LEFT inferior cerebellar encephalomalacia. Similar non expansile LEFT temporal and LEFT frontal FLAIR T2 hyperintense signal, to lesser extent LEFT mesial occipital lobe. Ventricles and sulci are overall normal for patient's age, persistent mild mass effect on the frontal horn of LEFT lateral ventricle. No midline shift.  No abnormal extra-axial fluid collections. Normal major intracranial vascular flow voids seen at the skull base.  Layering air-fluid levels within the sphenoid sinus. Ocular globes and orbital contents are unremarkable. No abnormal sellar expansion. No cerebellar tonsillar ectopia. Patient is edentulous.  IMPRESSION: No acute intracranial process.  Findings of amyloid angiopathy (though there may be a component chronic hypertension) as noted on  prior examination. Persistent vasogenic edema LEFT frontal and LEFT temporal lobes, with evolving LEFT small frontal lobe intraparenchymal hematoma. Though unlikely gliomatosis cerebri is not excluded, recommend follow-up if symptoms do not improve.  Old LEFT PICA infarct.  Acute sphenoid sinusitis.   Electronically Signed   By: Thana Farr.D.  On: 07/11/2014 02:34   Ct Abdomen Pelvis W Contrast  07/11/2014   CLINICAL DATA:  Status post fall. Right flank pain and right leg weakness. Initial encounter.  EXAM: CT ABDOMEN AND PELVIS WITH CONTRAST  TECHNIQUE: Multidetector CT imaging of the abdomen and pelvis was performed using the standard protocol following bolus administration of intravenous contrast.  CONTRAST:  129mL OMNIPAQUE IOHEXOL 300 MG/ML  SOLN  COMPARISON:  CT of the abdomen and pelvis from 01/16/2014  FINDINGS: Minimal bibasilar atelectasis is noted.  The liver and spleen are unremarkable in appearance. The gallbladder is within normal limits. The pancreas and adrenal glands are unremarkable.  Mild left renal atrophy is noted. Left-sided perinephric stranding is appreciated. The right kidney is unremarkable in appearance. There is no evidence of hydronephrosis. No renal or ureteral stones are seen. Mild prominence of the right ureter is nonspecific, without evidence of distal obstructing stone.  No free fluid is identified. The small bowel is unremarkable in appearance. The stomach is within normal limits. No acute vascular abnormalities are seen. Diffuse calcification is noted along the abdominal aorta and its branches.  The appendix appears to be normal in caliber, though difficult to fully characterize. There is no evidence of appendicitis. Scattered diverticulosis is noted along the proximal sigmoid colon, without evidence of diverticulitis.  The bladder is moderately distended and grossly unremarkable. The patient is status post hysterectomy. No suspicious adnexal masses are seen. No  inguinal lymphadenopathy is seen. There is mild atrophy of the paraspinal musculature.  No acute osseous abnormalities are identified.  IMPRESSION: 1. No acute abnormality seen to explain the patient's symptoms. 2. Mild left renal atrophy noted. 3. Diffuse calcification along the abdominal aorta and its branches. 4. Scattered diverticulosis along the proximal sigmoid colon, without evidence of diverticulitis. 5. Mild atrophy of the paraspinal musculature.   Electronically Signed   By: Garald Balding M.D.   On: 07/11/2014 01:25    EKG: Independently reviewed.  Abnormal findings:   Low voltage, LAD.   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 May be due to deconditioning, or secondary to right flank pain, but given her history of stroke, and amyloid angiography with hx of sub centimeter left frontal subacute hematoma, will need image to r/o new infarction or hematoma. -will admit to [facility] -Pt/OT -will consult to SW for placement  Hx of stroke and occlusion of left posterior inferior cerebellar artery with infarction -continue Plavix and lipitor  HTN: -continue lisinopril, metoprol  Depression: -continue Prozac  HLD: Last LDL was 71 on 02/01/14 -lipitor  GERD: -pepcid  Right flank pain: Etiology is not clear. -CT abdomen/pelvis -Pain control: Percocet when necessary  Acute encephalopathy: Etiology is not clear. Differential diagnosis includes acute delirium, possible new stroke, deterioration  of dementia, and deconditioning. -Previsit urine check  DVT ppx: SQ Heparin  Code Status: DNR Family Communication: Yes, patient's  daughter at bed side Disposition Plan: Admit to inpatient   Date of Service 07/11/2014    Ivor Costa [facility] Pager  [phone]  If 7PM-7AM, please contact night-coverage [email or web address] Password [email or web address] 07/11/2014, 7:31 AM

## 2014-07-10 NOTE — ED Notes (Signed)
  CBG 103  

## 2014-07-11 ENCOUNTER — Inpatient Hospital Stay (HOSPITAL_COMMUNITY): Payer: Medicare Other

## 2014-07-11 ENCOUNTER — Telehealth: Payer: Self-pay | Admitting: Neurology

## 2014-07-11 ENCOUNTER — Encounter (HOSPITAL_COMMUNITY): Payer: Self-pay

## 2014-07-11 ENCOUNTER — Other Ambulatory Visit: Payer: Self-pay

## 2014-07-11 DIAGNOSIS — I471 Supraventricular tachycardia: Secondary | ICD-10-CM

## 2014-07-11 DIAGNOSIS — Z515 Encounter for palliative care: Secondary | ICD-10-CM

## 2014-07-11 DIAGNOSIS — Z7189 Other specified counseling: Secondary | ICD-10-CM

## 2014-07-11 DIAGNOSIS — R531 Weakness: Principal | ICD-10-CM

## 2014-07-11 DIAGNOSIS — I619 Nontraumatic intracerebral hemorrhage, unspecified: Secondary | ICD-10-CM

## 2014-07-11 DIAGNOSIS — R29898 Other symptoms and signs involving the musculoskeletal system: Secondary | ICD-10-CM

## 2014-07-11 DIAGNOSIS — E858 Other amyloidosis: Secondary | ICD-10-CM

## 2014-07-11 DIAGNOSIS — I10 Essential (primary) hypertension: Secondary | ICD-10-CM

## 2014-07-11 DIAGNOSIS — C9110 Chronic lymphocytic leukemia of B-cell type not having achieved remission: Secondary | ICD-10-CM

## 2014-07-11 DIAGNOSIS — F0390 Unspecified dementia without behavioral disturbance: Secondary | ICD-10-CM

## 2014-07-11 LAB — BASIC METABOLIC PANEL
ANION GAP: 7 (ref 5–15)
BUN: 10 mg/dL (ref 6–20)
CALCIUM: 9.1 mg/dL (ref 8.9–10.3)
CHLORIDE: 100 mmol/L — AB (ref 101–111)
CO2: 25 mmol/L (ref 22–32)
Creatinine, Ser: 1.05 mg/dL — ABNORMAL HIGH (ref 0.44–1.00)
GFR, EST AFRICAN AMERICAN: 57 mL/min — AB (ref >60)
GFR, EST NON AFRICAN AMERICAN: 49 mL/min — AB (ref >60)
Glucose, Bld: 129 mg/dL — ABNORMAL HIGH (ref 65–99)
POTASSIUM: 4 mmol/L (ref 3.5–5.1)
SODIUM: 132 mmol/L — AB (ref 135–145)

## 2014-07-11 LAB — CBC
HCT: 36 % (ref 36.0–46.0)
HEMOGLOBIN: 12.6 g/dL (ref 12.0–15.0)
MCH: 31.4 pg (ref 26.0–34.0)
MCHC: 35 g/dL (ref 30.0–36.0)
MCV: 89.8 fL (ref 78.0–100.0)
Platelets: 219 10*3/uL (ref 150–400)
RBC: 4.01 MIL/uL (ref 3.87–5.11)
RDW: 12.5 % (ref 11.5–15.5)
WBC: 7.6 10*3/uL (ref 4.0–10.5)

## 2014-07-11 LAB — GLUCOSE, CAPILLARY: GLUCOSE-CAPILLARY: 117 mg/dL — AB (ref 65–99)

## 2014-07-11 LAB — PROTIME-INR
INR: 1.06 (ref 0.00–1.49)
Prothrombin Time: 14 seconds (ref 11.6–15.2)

## 2014-07-11 LAB — APTT: aPTT: 24 seconds (ref 24–37)

## 2014-07-11 MED ORDER — ADULT MULTIVITAMIN W/MINERALS CH
1.0000 | ORAL_TABLET | Freq: Every day | ORAL | Status: DC
  Filled 2014-07-11: qty 1

## 2014-07-11 MED ORDER — FAMOTIDINE 20 MG PO TABS
20.0000 mg | ORAL_TABLET | Freq: Every day | ORAL | Status: DC
  Administered 2014-07-11 – 2014-07-13 (×3): 20 mg via ORAL
  Filled 2014-07-11 (×4): qty 1

## 2014-07-11 MED ORDER — LISINOPRIL 10 MG PO TABS
10.0000 mg | ORAL_TABLET | Freq: Two times a day (BID) | ORAL | Status: DC
  Administered 2014-07-11 – 2014-07-13 (×5): 10 mg via ORAL
  Filled 2014-07-11 (×7): qty 1

## 2014-07-11 MED ORDER — METOPROLOL SUCCINATE ER 25 MG PO TB24
25.0000 mg | ORAL_TABLET | Freq: Every day | ORAL | Status: DC
  Administered 2014-07-11 – 2014-07-12 (×2): 25 mg via ORAL
  Filled 2014-07-11 (×3): qty 1

## 2014-07-11 MED ORDER — VITAMIN B-12 1000 MCG PO TABS
1000.0000 ug | ORAL_TABLET | Freq: Two times a day (BID) | ORAL | Status: DC
  Filled 2014-07-11 (×2): qty 1

## 2014-07-11 MED ORDER — SODIUM CHLORIDE 0.9 % IJ SOLN
3.0000 mL | Freq: Two times a day (BID) | INTRAMUSCULAR | Status: DC
  Administered 2014-07-11 – 2014-07-13 (×3): 3 mL via INTRAVENOUS

## 2014-07-11 MED ORDER — IOHEXOL 300 MG/ML  SOLN
100.0000 mL | Freq: Once | INTRAMUSCULAR | Status: AC | PRN
  Administered 2014-07-11: 100 mL via INTRAVENOUS

## 2014-07-11 MED ORDER — MORPHINE SULFATE (CONCENTRATE) 10 MG/0.5ML PO SOLN
5.0000 mg | ORAL | Status: DC | PRN
  Administered 2014-07-11 – 2014-07-12 (×4): 5 mg via ORAL
  Filled 2014-07-11 (×4): qty 0.5

## 2014-07-11 MED ORDER — ONDANSETRON HCL 4 MG PO TABS: 4.0000 mg | ORAL_TABLET | Freq: Four times a day (QID) | ORAL | Status: DC | PRN

## 2014-07-11 MED ORDER — CALCIUM CARBONATE-VITAMIN D 500-200 MG-UNIT PO TABS: 1.0000 | ORAL_TABLET | Freq: Two times a day (BID) | ORAL | Status: DC

## 2014-07-11 MED ORDER — ATORVASTATIN CALCIUM 80 MG PO TABS
80.0000 mg | ORAL_TABLET | Freq: Every morning | ORAL | Status: DC
  Filled 2014-07-11: qty 1

## 2014-07-11 MED ORDER — ACETAMINOPHEN 325 MG PO TABS
650.0000 mg | ORAL_TABLET | Freq: Once | ORAL | Status: DC
  Filled 2014-07-11: qty 2

## 2014-07-11 MED ORDER — LORAZEPAM 1 MG PO TABS: 1.0000 mg | ORAL_TABLET | Freq: Four times a day (QID) | ORAL | Status: DC | PRN

## 2014-07-11 MED ORDER — SODIUM CHLORIDE 0.9 % IV SOLN
INTRAVENOUS | Status: DC
  Administered 2014-07-11 – 2014-07-12 (×2): via INTRAVENOUS

## 2014-07-11 MED ORDER — CLOPIDOGREL BISULFATE 75 MG PO TABS
75.0000 mg | ORAL_TABLET | Freq: Every day | ORAL | Status: DC
  Administered 2014-07-11 – 2014-07-13 (×3): 75 mg via ORAL
  Filled 2014-07-11 (×4): qty 1

## 2014-07-11 MED ORDER — OXYCODONE-ACETAMINOPHEN 5-325 MG PO TABS
1.0000 | ORAL_TABLET | ORAL | Status: DC | PRN
  Filled 2014-07-11: qty 1

## 2014-07-11 MED ORDER — ONDANSETRON HCL 4 MG/2ML IJ SOLN
4.0000 mg | Freq: Four times a day (QID) | INTRAMUSCULAR | Status: DC | PRN
  Administered 2014-07-11: 4 mg via INTRAVENOUS
  Filled 2014-07-11: qty 2

## 2014-07-11 MED ORDER — FLUOXETINE HCL 20 MG PO TABS
20.0000 mg | ORAL_TABLET | Freq: Every day | ORAL | Status: DC
  Administered 2014-07-11 – 2014-07-13 (×3): 20 mg via ORAL
  Filled 2014-07-11 (×5): qty 1

## 2014-07-11 MED ORDER — HEPARIN SODIUM (PORCINE) 5000 UNIT/ML IJ SOLN
5000.0000 [IU] | Freq: Three times a day (TID) | INTRAMUSCULAR | Status: DC
  Administered 2014-07-11: 5000 [IU] via SUBCUTANEOUS
  Filled 2014-07-11 (×3): qty 1

## 2014-07-11 NOTE — Consult Note (Signed)
Consultation Note Date: 07/11/2014   Patient Name: [name]  DOB: 09-17-1933  MRN: [id number]  Age / Sex: 81 y.o., female   PCP: Debbrah Alar, NP Referring Physician: Thurnell Lose, MD  Reason for Consultation: Establishing goals of care, Non pain symptom management, Pain control and Psychosocial/spiritual support  Palliative Care Assessment and Plan Summary of Established Goals of Care and Medical Treatment Preferences    Palliative Care Discussion Held Today:    This NP Wadie Lessen reviewed medical records, received report from team, assessed the patient and then meet at the patient's bedside along with her daughter Ellin Goodie and her husband Shanon Brow, and sons Emerald Shor and Capucine Tryon  to discuss diagnosis prognosis, GOC, EOL wishes disposition and options.  A detailed discussion was had today regarding advanced directives.  Concepts specific to code status, artifical feeding and hydration, continued IV antibiotics and rehospitalization was had.  The difference between a aggressive medical intervention path  and a palliative comfort care path for this patient at this time was had.  Values and goals of care important to patient and family were attempted to be elicited.  Concept of Hospice and Palliative Care were discussed  Natural trajectory and expectations at EOL were discussed.  Questions and concerns addressed.  Hard Choices booklet left for review. Family encouraged to call with questions or concerns.  PMT will continue to support holistically.  MOST form introduced  Primary Decision Maker: HPOA/daughter Dwan Bolt  Goals of Care/Code Status/Advance Care Planning:   Focus is comfort, quality and dignity.  No life prolonging measures.   Code Status:  DNR/DNI-comfort is main focus of care  Artificial feeding: not now or in the future  Antibiotics:none  Diagnostics:none  Rehospitalization:avoid   Symptom Management:    Bowel regiment:  Dulcolax supp  Incontinence: place foley for EOL care  Dysphagia: diet as tolerated with known risk of aspiration  Pain/Dyspnea: Roxanol 5 mg po/sl every 2 hrs prn  Agitation: Ativan 1 mg po/sl every 6 hrs prn  Psycho-social/Spiritual:    Support System: family   Prognosis: Pending outcomes  Discharge Planning:  Inpatient hospice vs AL with hospice services, re-evaluate tomorrow, re-meet with family tomorrow at 71       Chief Complaint: Altered mental status, weakness  History of Present Illness:   81 y.o. female with PMH of hyperlipidemia, GERD, depression, anxiety, falls, hearing loss, DVT not on anticoagulating due to falls, stroke, back pain, chronic kidney disease-status 3, dementia, stroke, recent left frontal subacute hematoma, who presents with altered mental status, right leg weakness and right flank pain.  Patient is accompanied by her daughter. She has altered mental status, history is obtained from her daughter. Per daughter, over the past 2 weeks, patient has progressive decline. She becomes more confused. She seems to have right leg weakness and drags her right leg. She has significantly decreased mobility. She also complains of right flank pain.   Of note, she is living at home with her daughter and son in law, but they are no longer able to take care of her. She was initially set up to go to an assisted living facility but now wouldn't be able to function without 24 hour care.  In ED, patient was found to have WBC 5.5, negative urinalysis, temperature normal, bradycardia, negative chest x-ray. Patient is admitted to inpatient for further evaluation and treatment.  Family describes overall failure to thrive, poor po intake, weight loss, decreased function and cognition.  Faced with advanced directive  decisions and anticipatory care need.  Primary Diagnoses  Present on Admission:  . Progressive neurologic decline . Right flank pain . 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 HLD (hyperlipidemia) . GERD (gastroesophageal reflux disease) . Essential hypertension . Depression . Dementia . Cerebral hemorrhage, nontraumatic . Amyloid neuropathy . Acute encephalopathy . Right leg weakness  Palliative Review of Systems:    -unable to illicit to to decreased cognition   I have reviewed the medical record, interviewed the patient and family, and examined the patient. The following aspects are pertinent.  Past Medical History  Diagnosis Date  . Hypertension   . Hyperlipemia   . Cerebral hemorrhage, nontraumatic 2011  . Ureterocele, acquired   . Broken arm     left X 2; right X 1; 1 OR" (07/01/2014)  . Brain swelling 2016  . History of pubovaginal sling   . Depression   . Memory loss   . Confusion   . Falls frequently   . Hearing loss   . Hyperlipemia   . DVT (deep venous thrombosis)     "she has one behind her left knee" (07/01/2014)  . GERD (gastroesophageal reflux disease)   . History of hiatal hernia   . Stroke 2015 & 2016    Dec; Jan, blockage cerebellum; "balance issues; cognition issues since" (07/01/2014)  . CVA (cerebral vascular accident) 2011    left cerebellum  . Osteoporosis   . Collapsed vertebra     "one in her lower and upper back" (07/01/2014)  . Chronic back pain     "w/activity" (07/01/2014)  . Anxiety   . Chronic kidney disease (CKD), stage III (moderate)   . Dementia     "since 2011 CVA; due to micro hemorrhages"    History   Social History  . Marital Status: Married    Spouse Name: Trilby Drummer  . Number of Children: 3  . Years of Education: 6th   Occupational History  . Retired    Social History Main Topics  . Smoking status: Former Smoker -- 1.00 packs/day for 40 years    Types: Cigarettes  . Smokeless tobacco: Never Used     Comment: "stopped smoking in the 1990's"  . Alcohol Use: No  . Drug Use: No  . Sexual Activity: No   Other Topics Concern  . None    Social History Narrative   Former Nurse, [employer]   Completed 6th grade   Married   2 sons and 1 daughter ( lives with daughter)   No hobbies   Family History  Problem Relation Age of Onset  . Stroke Mother   . Diabetes Mother   . Hyperlipidemia Mother   . Hypertension Mother   . Stroke Father   . Heart disease Father   . Hyperlipidemia Father   . Depression Sister   . Cancer Brother 40    lung   Scheduled Meds: . acetaminophen  650 mg Oral Once  . clopidogrel  75 mg Oral Daily  . famotidine  20 mg Oral Daily  . FLUoxetine  20 mg Oral Daily  . lisinopril  10 mg Oral BID  . metoprolol succinate  25 mg Oral QHS  . sodium chloride  3 mL Intravenous Q12H   Continuous Infusions: . sodium chloride 75 mL/hr at 07/11/14 0231   PRN Meds:.LORazepam, morphine CONCENTRATE, ondansetron **OR** ondansetron (ZOFRAN) IV Medications Prior to Admission:  Prior to Admission medications   Medication Sig Start Date End Date Taking?  Authorizing Provider  acetaminophen (TYLENOL) 500 MG tablet Take 1,000 mg by mouth every 6 (six) hours as needed for mild pain.   Yes Historical Provider, MD  alendronate (FOSAMAX) 70 MG tablet Take 1 tablet (70 mg total) by mouth once a week. 03/31/14  Yes Debbrah Alar, NP  atorvastatin (LIPITOR) 80 MG tablet Take 1 tablet (80 mg total) by mouth every morning. 03/31/14  Yes Debbrah Alar, NP  Calcium Carbonate-Vitamin D 600-400 MG-UNIT per tablet Take 1 tablet by mouth 2 (two) times daily.   Yes Historical Provider, MD  clopidogrel (PLAVIX) 75 MG tablet Take 1 tablet (75 mg total) by mouth daily. 02/23/14  Yes Daniel J Angiulli, PA-C  FLUoxetine (PROZAC) 20 MG tablet Take 1 tablet (20 mg total) by mouth daily. 05/02/14  Yes Debbrah Alar, NP  lisinopril (PRINIVIL,ZESTRIL) 10 MG tablet Take 1 tablet (10 mg total) by mouth 2 (two) times daily. 06/27/14  Yes Debbrah Alar, NP  metoprolol succinate (TOPROL-XL) 25 MG 24 hr tablet Take 1 tablet (25 mg  total) by mouth daily. Patient taking differently: Take 25 mg by mouth at bedtime.  06/13/14  Yes Debbrah Alar, NP  Multiple Vitamin (MULTIVITAMIN WITH MINERALS) TABS Take 1 tablet by mouth daily.   Yes Historical Provider, MD  ranitidine (ZANTAC) 150 MG tablet Take 1 tablet (150 mg total) by mouth at bedtime. 04/15/14  Yes Brunetta Jeans, PA-C  vitamin B-12 (CYANOCOBALAMIN) 1000 MCG tablet Take 1,000 mcg by mouth 2 (two) times daily.    Yes Historical Provider, MD   Allergies  Allergen Reactions  . Penicillins     Reaction: unknown  . Adhesive [Tape] Rash    Please use "paper" tape only.  . Latex Rash   CBC:    Component Value Date/Time   WBC 7.6 07/11/2014 0448   HGB 12.6 07/11/2014 0448   HCT 36.0 07/11/2014 0448   PLT 219 07/11/2014 0448   MCV 89.8 07/11/2014 0448   NEUTROABS 3.3 07/01/2014 0041   LYMPHSABS 1.7 07/01/2014 0041   MONOABS 0.5 07/01/2014 0041   EOSABS 0.1 07/01/2014 0041   BASOSABS 0.0 07/01/2014 0041   Comprehensive Metabolic Panel:    Component Value Date/Time   NA 132* 07/11/2014 0448   K 4.0 07/11/2014 0448   CL 100* 07/11/2014 0448   CO2 25 07/11/2014 0448   BUN 10 07/11/2014 0448   CREATININE 1.05* 07/11/2014 0448   GLUCOSE 129* 07/11/2014 0448   CALCIUM 9.1 07/11/2014 0448   AST 24 07/10/2014 1837   ALT 25 07/10/2014 1837   ALKPHOS 56 07/10/2014 1837   BILITOT 0.4 07/10/2014 1837   PROT 6.2* 07/10/2014 1837   ALBUMIN 3.5 07/10/2014 1837    Physical Exam:  Vital Signs: BP 140/48 mmHg  Pulse 57  Temp(Src) 97.8 F (36.6 C) (Oral)  Resp 17  Wt 62.778 kg (138 lb 6.4 oz)  SpO2 97% SpO2: SpO2: 97 % O2 Device: O2 Device: Not Delivered O2 Flow Rate:   Intake/output summary:  Intake/Output Summary (Last 24 hours) at 07/11/14 1052 Last data filed at 07/11/14 0800  Gross per 24 hour  Intake      0 ml  Output    250 ml  Net   -250 ml   LBM:   Baseline Weight: Weight: 62.778 kg (138 lb 6.4 oz) Most recent weight: Weight: 62.778 kg  (138 lb 6.4 oz)  Exam Findings:   General: chronically ill appearing,non verbal, unable to follow commands HEENT: amosite buccal membranes CVS: RRR Resp:  decreased in bases Skin: warm and dry            Palliative Performance Scale: 30 % at best at best                Additional Data Reviewed: Recent Labs     07/10/14  1837  07/11/14  0448  WBC  5.5  7.6  HGB  12.6  12.6  PLT  237  219  NA  132*  132*  BUN  11  10  CREATININE  0.96  1.05*     Time In: 0930 Time Out: 1045 Time Total: 75 min  Greater than 50%  of this time was spent counseling and coordinating care related to the above assessment and plan.  Discussed with  Dr  Candiss Norse  Signed by: Wadie Lessen, NP  Knox Royalty, NP  07/11/2014, 10:52 AM  Please contact Palliative Medicine Team phone at [phone] for questions and concerns.   See AMION for contact information

## 2014-07-11 NOTE — Progress Notes (Signed)
Nutrition Brief Note  Chart reviewed. Pt now transitioning to comfort care.  No further nutrition interventions warranted at this time.  Please re-consult as needed.   Solaris Kram A. Verna Desrocher, RD, LDN, CDE Pager: [phone] After hours Pager: [phone]  

## 2014-07-11 NOTE — Progress Notes (Signed)
PT  Note  Patient Details Name: [name] MRN: [id number] DOB: 11-15-1933   Cancelled Treatment:    Reason Eval/Treat Not Completed: PT screened, no needs identified, will sign off (order cancelled, pt on palliative/comfort care measures)   [facility] 07/11/2014, 11:35 AM

## 2014-07-11 NOTE — Progress Notes (Signed)
Patient Demographics:    [name], is a 81 y.o. female, DOB - November 11, 1933, SPQ:[id number]  Admit date - 07/10/2014   Admitting Physician Ivor Costa, MD  Outpatient Primary MD for the patient is [name], NP  LOS - 1   Chief Complaint  Patient presents with  . Altered Mental Status        Subjective:    [name] today has, No headache, No chest pain, No abdominal pain - No Nausea, No new weakness tingling or numbness, No Cough - SOB. R sided weakness   Assessment  & Plan :     1.R sided weakness acute on chronic with some worsening - MRI shows amyloid angiopathy. No acute stroke. Persistent vasogenic edema LEFT frontal and LEFT temporal lobes, with evolving LEFT small frontal lobe intraparenchymal hematoma. Neurology on board, recommendations from asked admission where supportive care directed towards comfort, continue Plavix per neurology, continue supportive care with care directed towards comfort, parity of care involved, patient and family wanted comfort measures with gentle medical treatment. Will likely be placed to SNF with palliative care.   2. Questionable dysphagia. Likely due to #1 above. Speech to eval. On dysphagia 1 diet for now.   3. Essential hypertension. Continue ACE inhibitor at present dose.   4. GERD. On PPI continue.   5. Fall 1 week ago with right-sided hip pain. X-ray and CT do not show any fracture, supportive care.    Code Status : DNR  Family Communication  : Daughter POA  Disposition Plan  : SNF with Pall care  Consults  :  Neuro  Procedures  :   MRI Brain  DVT Prophylaxis  :  SCDs    Lab Results  Component Value Date   PLT 219 07/11/2014    Inpatient Medications  Scheduled Meds: . acetaminophen  650 mg Oral Once  .  clopidogrel  75 mg Oral Daily  . famotidine  20 mg Oral Daily  . FLUoxetine  20 mg Oral Daily  . lisinopril  10 mg Oral BID  . metoprolol succinate  25 mg Oral QHS  . sodium chloride  3 mL Intravenous Q12H   Continuous Infusions: . sodium chloride 75 mL/hr at 07/11/14 0231   PRN Meds:.LORazepam, morphine CONCENTRATE, ondansetron **OR** ondansetron (ZOFRAN) IV  Antibiotics  :     Anti-infectives    None        Objective:   Filed Vitals:   07/10/14 2340 07/10/14 2359 07/11/14 0500 07/11/14 0907  BP:  162/69 156/67 140/48  Pulse:  56 64 57  Temp: 98 F (36.7 C) 97.5 F (36.4 C) 97.6 F (36.4 C) 97.8 F (36.6 C)  TempSrc: Oral Oral Oral Oral  Resp:  16 18 17   Weight:  62.778 kg (138 lb 6.4 oz)    SpO2:  97% 97% 97%    Wt Readings from Last 3 Encounters:  07/10/14 62.778 kg (138 lb 6.4 oz)  07/01/14 51.755 kg (114 lb 1.6 oz)  06/30/14 53.524 kg (118 lb)     Intake/Output Summary (Last 24 hours) at 07/11/14 1115 Last data filed at 07/11/14 0800  Gross per 24 hour  Intake      0 ml  Output    250 ml  Net   -250 ml     Physical Exam  Awake , R side 3/5, Normal affect [place].AT,PERRAL Supple Neck,No JVD, No cervical lymphadenopathy appriciated.  Symmetrical Chest wall movement, Good air movement bilaterally, CTAB RRR,No Gallops,Rubs or new Murmurs, No Parasternal Heave +ve B.Sounds, Abd Soft, No tenderness, No organomegaly appriciated, No rebound - guarding or rigidity. No Cyanosis, Clubbing or edema, No new Rash or bruise       Data Review:   Micro Results No results found for this or any previous visit (from the past 240 hour(s)).  Radiology Reports Dg Chest 2 View  07/10/2014   CLINICAL DATA:  Acute onset of altered mental status. Multiple falls. Initial encounter.  EXAM: CHEST  2 VIEW  COMPARISON:  Chest radiograph performed 07/01/2014  FINDINGS: The lungs are well-aerated. Minimal bilateral atelectasis is noted. There is no evidence of pleural effusion or  pneumothorax.  The heart is mildly enlarged. No acute osseous abnormalities are seen.  IMPRESSION: Mild cardiomegaly; minimal bilateral atelectasis seen.   Electronically Signed   By: Garald Balding M.D.   On: 07/10/2014 21:25   Dg Chest 2 View  07/01/2014   CLINICAL DATA:  Patient with altered mental status.  Prior stroke.  EXAM: CHEST  2 VIEW  COMPARISON:  Chest radiograph 04/06/2014  FINDINGS: Stable enlarged cardiac and mediastinal contours. No consolidative pulmonary opacities. No pleural effusion or pneumothorax. Mid thoracic spine degenerative changes.  IMPRESSION: No acute cardiopulmonary process.   Electronically Signed   By: Lovey Newcomer M.D.   On: 07/01/2014 01:22   Dg Ribs Unilateral W/chest Right  07/08/2014   CLINICAL DATA:  [place] Pain in Rigfht ribs( region of lateral midribs near bottom of right breastand right hip Near right iliac wing  EXAM: RIGHT RIBS AND CHEST - 3+ VIEW  COMPARISON:  07/01/2014  FINDINGS: No rib fracture or rib lesion.  Clear lungs.  No pleural effusion or pneumothorax.  Cardiac silhouette is normal in size and configuration. No mediastinal or hilar masses or evidence of adenopathy.  IMPRESSION: 1. No rib fracture or rib lesion. 2. No acute cardiopulmonary disease.   Electronically Signed   By: Lajean Manes M.D.   On: 07/08/2014 12:27   Ct Head Wo Contrast  06/29/2014   CLINICAL DATA:  TIA.  Recurrent symptoms.  EXAM: CT HEAD WITHOUT CONTRAST  TECHNIQUE: Contiguous axial images were obtained from the base of the skull through the vertex without intravenous contrast.  COMPARISON:  CT head 04/06/2014.  MRI 04/07/2014  FINDINGS: Chronic infarct left inferior cerebellum is unchanged.  New area of hypodensity in the left lateral and posterior temporal lobe. It is difficult determine if this represents vasogenic edema versus interval infarction.  New hypodensity left frontal lobe involving predominately white matter. This could represent vasogenic edema versus subacute infarct.  No definite mass lesion identified.  Generalized atrophy. Negative for hydrocephalus. Negative for intracranial hemorrhage  Calvarium intact  IMPRESSION: Chronic left PICA infarct unchanged  New areas of low-density in the left frontal lobe and left temporal lobe not present on the prior studies. This could represent subacute infarct however vasogenic edema from tumor is in the differential. Follow-up MRI brain with contrast recommended for further evaluation.   Electronically Signed   By: Franchot Gallo M.D.   On: 06/29/2014 08:37   Mr Brain Wo Contrast  07/11/2014   CLINICAL DATA:  Altered mental status, unsteady gait since discharge from hospital last Saturday. RIGHT leg weakness. History of stroke, hypertension, hyperlipidemia.  EXAM: MRI HEAD WITHOUT CONTRAST  TECHNIQUE: Multiplanar, multiecho pulse sequences of the brain and surrounding structures were obtained without intravenous contrast.  COMPARISON:  MRI of the brain July 01, 2014  FINDINGS: No reduced diffusion to suggest acute ischemia. Faint susceptibility artifact in spurious reduced diffusion in LEFT frontal lobe corresponding to sub cm evolving intraparenchymal hematoma. Innumerable punctate small foci of susceptibility artifact throughout the supratentorial and to lesser extent infratentorial brain, present on prior imaging. No midline shift, mass effect or mass lesions. LEFT inferior cerebellar encephalomalacia. Similar non expansile LEFT temporal and LEFT frontal FLAIR T2 hyperintense signal, to lesser extent LEFT mesial occipital lobe. Ventricles and sulci are overall normal for patient's age, persistent mild mass effect on the frontal horn of LEFT lateral ventricle. No midline shift.  No abnormal extra-axial fluid collections. Normal major intracranial vascular flow voids seen at the skull base.  Layering air-fluid levels within the sphenoid sinus. Ocular globes and orbital contents are unremarkable. No abnormal sellar expansion. No cerebellar  tonsillar ectopia. Patient is edentulous.  IMPRESSION: No acute intracranial process.  Findings of amyloid angiopathy (though there may be a component chronic hypertension) as noted on prior examination. Persistent vasogenic edema LEFT frontal and LEFT temporal lobes, with evolving LEFT small frontal lobe intraparenchymal hematoma. Though unlikely gliomatosis cerebri is not excluded, recommend follow-up if symptoms do not improve.  Old LEFT PICA infarct.  Acute sphenoid sinusitis.   Electronically Signed   By: Elon Alas M.D.   On: 07/11/2014 02:34   Mr Brain Wo Contrast  06/30/2014   CLINICAL DATA:  81 year old hypertensive female with history of hyperlipidemia and dementia presenting with increasing confusion over the past 2 weeks and possible stroke. Abnormal CT. Subsequent encounter.  EXAM: MRI HEAD WITHOUT CONTRAST  TECHNIQUE: Multiplanar, multiecho pulse sequences of the brain and surrounding structures were obtained without intravenous contrast.  COMPARISON:  06/28/2014 CT.  04/07/2014 MR.  FINDINGS: Representing a significant change from the relatively recent MR (04/07/2014) is the presence of prominent vasogenic edema within the anterior superior left frontal lobe and the anterior to mid left temporal lobe. Along the periphery are scattered blood breakdown products with focal 9 mm hematoma superior anterior left frontal lobe. Etiology is indeterminate. Mild local mass effect with slight inferior displacement of the left lateral ventricle.  This may reflect changes of amyloid angiography. Cortical vein thrombosis could cause a similar appearance although the major dural sinuses are patent. Metastatic disease is not entirely excluded. Contrast was not administered. Infection does not appear to be the case clinically and the MR findings are not indicative of such.  Patient would benefit from contrast-enhanced imaging to exclude underlying lesion in addition to followup imaging to help demonstrate  that the vasogenic clears as would be expected with a benign process.  No acute thrombotic infarct.  Remote partially hemorrhagic infarct mid to inferior left cerebellum.  Mild small vessel disease type changes.  Global atrophy without hydrocephalus.  Major intracranial vascular structures are patent.  IMPRESSION: Representing a significant change from the relatively recent MR (04/07/2014) is the presence of prominent vasogenic edema within the anterior superior left frontal lobe and the anterior to mid left temporal lobe. Along the periphery are scattered blood breakdown products with focal 9 mm hematoma superior anterior left frontal lobe. Etiology is indeterminate. Mild local mass effect with slight inferior displacement of the left lateral ventricle.  This may reflect changes of amyloid angiography. Cortical vein thrombosis could cause a similar appearance although the major dural  sinuses are patent. Metastatic disease is not entirely excluded.  Patient would benefit from contrast-enhanced imaging to exclude underlying lesion in addition to followup imaging to help demonstrate that the vasogenic clears as would be expected with a benign process.  No acute thrombotic infarct.  Remote partially hemorrhagic infarct mid to inferior left cerebellum.  These results were called by telephone at the time of interpretation on 06/30/2014 at 4:24 pm to [facility], Nurse Practitioner, who verbally acknowledged these results.   Electronically Signed   By: Genia Del M.D.   On: 06/30/2014 17:00   Mr Jeri Cos Contrast  07/01/2014   CLINICAL DATA:  Worsening altered mental status for a few weeks, difficulty speaking, confusion and memory issues. History of stroke, hypertension, hyperlipidemia. Followup.  EXAM: MRI HEAD WITHOUT AND WITH CONTRAST  TECHNIQUE: Multiplanar, multiecho pulse sequences of the brain and surrounding structures were obtained without and with intravenous contrast ; limited examination, follow-up  to yesterday's noncontrast MRI of the head.  CONTRAST:  48mL MULTIHANCE GADOBENATE DIMEGLUMINE 529 MG/ML IV SOLN  COMPARISON:  MRI of the head June 30, 2014 and MRI head April 07, 2014  FINDINGS: Subcentimeter focus of reduced diffusion within LEFT frontal lobe superior gyrus associated with small amount of susceptibility artifact and T1 shortening. No susceptibility artifact to suggest cortical vein thrombosis. Innumerable punctate supratentorial and cerebellar foci of susceptibility artifact. Faint irregular enhancement associated with the predominately LEFT-sided susceptibility artifact.  No abnormal extra-axial fluid collections or suspicious extra-axial enhancement, no extra-axial mass. Patchy mildly expansile T2 hyperintense signal in the LEFT frontal lobe, LEFT temporal lobe, in addition to subacute to early chronic LEFT posterior inferior cerebellar artery territory infarct.  IMPRESSION: Susceptibility artifact, sub cm LEFT frontal subacute hematoma, multifocal parenchymal edema and faint enhancement, constellation of findings are most consistent with amyloid angiography without suspicious intracranial mass.  Subacute to chronic appearing LEFT posterior inferior cerebellar artery territory infarct.   Electronically Signed   By: Elon Alas M.D.   On: 07/01/2014 05:48   Ct Abdomen Pelvis W Contrast  07/11/2014   CLINICAL DATA:  Status post fall. Right flank pain and right leg weakness. Initial encounter.  EXAM: CT ABDOMEN AND PELVIS WITH CONTRAST  TECHNIQUE: Multidetector CT imaging of the abdomen and pelvis was performed using the standard protocol following bolus administration of intravenous contrast.  CONTRAST:  133mL OMNIPAQUE IOHEXOL 300 MG/ML  SOLN  COMPARISON:  CT of the abdomen and pelvis from 01/16/2014  FINDINGS: Minimal bibasilar atelectasis is noted.  The liver and spleen are unremarkable in appearance. The gallbladder is within normal limits. The pancreas and adrenal glands are  unremarkable.  Mild left renal atrophy is noted. Left-sided perinephric stranding is appreciated. The right kidney is unremarkable in appearance. There is no evidence of hydronephrosis. No renal or ureteral stones are seen. Mild prominence of the right ureter is nonspecific, without evidence of distal obstructing stone.  No free fluid is identified. The small bowel is unremarkable in appearance. The stomach is within normal limits. No acute vascular abnormalities are seen. Diffuse calcification is noted along the abdominal aorta and its branches.  The appendix appears to be normal in caliber, though difficult to fully characterize. There is no evidence of appendicitis. Scattered diverticulosis is noted along the proximal sigmoid colon, without evidence of diverticulitis.  The bladder is moderately distended and grossly unremarkable. The patient is status post hysterectomy. No suspicious adnexal masses are seen. No inguinal lymphadenopathy is seen. There is mild atrophy of the paraspinal  musculature.  No acute osseous abnormalities are identified.  IMPRESSION: 1. No acute abnormality seen to explain the patient's symptoms. 2. Mild left renal atrophy noted. 3. Diffuse calcification along the abdominal aorta and its branches. 4. Scattered diverticulosis along the proximal sigmoid colon, without evidence of diverticulitis. 5. Mild atrophy of the paraspinal musculature.   Electronically Signed   By: Garald Balding M.D.   On: 07/11/2014 01:25   Ct Hip Right Wo Contrast  07/11/2014   CLINICAL DATA:  Recent fall with right leg weakness and pain.  EXAM: CT OF THE RIGHT HIP WITHOUT CONTRAST  TECHNIQUE: Images of the right hip were reconstructed with thin cuts from an abdomen/pelvis CT on 07/11/2014 with IV contrast.  COMPARISON:  01/16/2014  FINDINGS: No significant soft tissue swelling in the right groin or proximal right thigh. Images of the right lower abdomen and right pelvis are unremarkable. Atherosclerotic  calcifications in right iliac arteries without significant stenosis. Proximal right femoral arteries are patent. Uterus appears to be surgically absent. There is osteopenia in the pelvic bones, particularly involving the sacrum. The right side of sacrum is intact. Normal appearance of the right sacroiliac joint. The right hip is located without a fracture. Right acetabulum is intact. The right pubic rami are intact.  IMPRESSION: No acute bone abnormality involving the right hip.   Electronically Signed   By: Markus Daft M.D.   On: 07/11/2014 09:59   Dg Hip Unilat With Pelvis 2-3 Views Right  07/08/2014   CLINICAL DATA:  Fall, right pelvic pain near iliac wing.  EXAM: RIGHT HIP (WITH PELVIS) 2-3 VIEWS  COMPARISON:  None.  FINDINGS: No acute bony abnormality. Specifically, no fracture, subluxation, or dislocation. Soft tissues are intact. Early joint space narrowing in the hip joints bilaterally. SI joints are symmetric and unremarkable.  IMPRESSION: No acute bony abnormality.   Electronically Signed   By: Rolm Baptise M.D.   On: 07/08/2014 12:29     CBC  Recent Labs Lab 07/10/14 1837 07/11/14 0448  WBC 5.5 7.6  HGB 12.6 12.6  HCT 35.6* 36.0  PLT 237 219  MCV 89.9 89.8  MCH 31.8 31.4  MCHC 35.4 35.0  RDW 12.4 12.5    Chemistries   Recent Labs Lab 07/10/14 1837 07/11/14 0448  NA 132* 132*  K 3.8 4.0  CL 98* 100*  CO2 24 25  GLUCOSE 101* 129*  BUN 11 10  CREATININE 0.96 1.05*  CALCIUM 9.2 9.1  AST 24  --   ALT 25  --   ALKPHOS 56  --   BILITOT 0.4  --    ------------------------------------------------------------------------------------------------------------------ estimated creatinine clearance is 35.9 mL/min (by C-G formula based on Cr of 1.05). ------------------------------------------------------------------------------------------------------------------ No results for input(s): HGBA1C in the last 72  hours. ------------------------------------------------------------------------------------------------------------------ No results for input(s): CHOL, HDL, LDLCALC, TRIG, CHOLHDL, LDLDIRECT in the last 72 hours. ------------------------------------------------------------------------------------------------------------------ No results for input(s): TSH, T4TOTAL, T3FREE, THYROIDAB in the last 72 hours.  Invalid input(s): FREET3 ------------------------------------------------------------------------------------------------------------------ No results for input(s): VITAMINB12, FOLATE, FERRITIN, TIBC, IRON, RETICCTPCT in the last 72 hours.  Coagulation profile  Recent Labs Lab 07/11/14 0448  INR 1.06    No results for input(s): DDIMER in the last 72 hours.  Cardiac Enzymes No results for input(s): CKMB, TROPONINI, MYOGLOBIN in the last 168 hours.  Invalid input(s): CK ------------------------------------------------------------------------------------------------------------------ Invalid input(s): POCBNP   Time Spent in minutes   35   SINGH,PRASHANT K M.D on 07/11/2014 at 11:15 AM  Between 7am to 7pm - Pager -  [phone]  After 7pm go to [email or web address] - password [facility]  [facility]   Office  [phone]

## 2014-07-11 NOTE — Telephone Encounter (Signed)
I got a call from Hospice indicating that the patient was having increased confusion and she is walking about the house, falling frequently. She is felt to be unmanageable at home. They will get the patient to the ER for evaluation. The patient has severe dementia.

## 2014-07-11 NOTE — Consult Note (Signed)
NEURO HOSPITALIST CONSULT NOTE   Referring physician: Candiss Norse   Reason for Consult: discuss MRI ready prior to patient going on Comfort care.   HPI:                                                                                                                                          [name] is an 81 y.o. 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 MRI on 6/23 showed "prominent vasogenic edema within the anterior superior left frontal lobe and the anterior to mid left temporal lobe. Along the periphery are scattered blood breakdown products with focal 9 mm hematoma superior anterior left frontal"constellation of findings are most consistent with amyloid angiography " Family has discussed and would like no interventions but would like their mother to be on palliative care.  MRI brain obtained on this visit and showed similar findings and evolving LEFT small frontal lobe intraparenchymal hematoma. Family desired to talk to neurology prior to placing parent on palliative care.   Past Medical History  Diagnosis Date  . Hypertension   . Hyperlipemia   . Cerebral hemorrhage, nontraumatic 2011  . Ureterocele, acquired   . Broken arm     left X 2; right X 1; 1 OR" (07/01/2014)  . Brain swelling 2016  . History of pubovaginal sling   . Depression   . Memory loss   . Confusion   . Falls frequently   . Hearing loss   . Hyperlipemia   . DVT (deep venous thrombosis)     "she has one behind her left knee" (07/01/2014)  . GERD (gastroesophageal reflux disease)   . History of hiatal hernia   . Stroke 2015 & 2016    Dec; Jan, blockage cerebellum; "balance issues; cognition issues since" (07/01/2014)  . CVA (cerebral  vascular accident) 2011    left cerebellum  . Osteoporosis   . Collapsed vertebra     "one in her lower and upper back" (07/01/2014)  . Chronic back pain     "w/activity" (07/01/2014)  . Anxiety   . Chronic kidney disease (CKD), stage III (moderate)   . Dementia     "since 2011 CVA; due to micro hemorrhages"     Past Surgical History  Procedure Laterality Date  . Cholecystectomy    . Fracture surgery    . Bladder suspension  2006  . Forearm fracture surgery Left 1980's  . Abdominal hysterectomy      "partial"    Family History  Problem Relation Age of Onset  . Stroke Mother   .  Diabetes Mother   . Hyperlipidemia Mother   . Hypertension Mother   . Stroke Father   . Heart disease Father   . Hyperlipidemia Father   . Depression Sister   . Cancer Brother 40    lung    Social History:  reports that she has quit smoking. Her smoking use included Cigarettes. She has a 40 pack-year smoking history. She has never used smokeless tobacco. She reports that she does not drink alcohol or use illicit drugs.  Allergies  Allergen Reactions  . Penicillins     Reaction: unknown  . Adhesive [Tape] Rash    Please use "paper" tape only.  . Latex Rash    MEDICATIONS:                                                                                                                     Prior to Admission:  Prescriptions prior to admission  Medication Sig Dispense Refill Last Dose  . acetaminophen (TYLENOL) 500 MG tablet Take 1,000 mg by mouth every 6 (six) hours as needed for mild pain.   07/10/2014 at Unknown time  . alendronate (FOSAMAX) 70 MG tablet Take 1 tablet (70 mg total) by mouth once a week. 12 tablet 1 07/09/2014 at Unknown time  . atorvastatin (LIPITOR) 80 MG tablet Take 1 tablet (80 mg total) by mouth every morning. 90 tablet 1 07/10/2014 at Unknown time  . Calcium Carbonate-Vitamin D 600-400 MG-UNIT per tablet Take 1 tablet by mouth 2 (two) times daily.   07/10/2014 at Unknown time  .  clopidogrel (PLAVIX) 75 MG tablet Take 1 tablet (75 mg total) by mouth daily. 30 tablet 1 07/10/2014 at Unknown time  . FLUoxetine (PROZAC) 20 MG tablet Take 1 tablet (20 mg total) by mouth daily. 30 tablet 3 07/10/2014 at Unknown time  . lisinopril (PRINIVIL,ZESTRIL) 10 MG tablet Take 1 tablet (10 mg total) by mouth 2 (two) times daily. 60 tablet 5 07/10/2014 at Unknown time  . metoprolol succinate (TOPROL-XL) 25 MG 24 hr tablet Take 1 tablet (25 mg total) by mouth daily. (Patient taking differently: Take 25 mg by mouth at bedtime. ) 30 tablet 2 07/10/2014 at 1730  . Multiple Vitamin (MULTIVITAMIN WITH MINERALS) TABS Take 1 tablet by mouth daily.   07/10/2014 at Unknown time  . ranitidine (ZANTAC) 150 MG tablet Take 1 tablet (150 mg total) by mouth at bedtime. 30 tablet 3 07/10/2014 at Unknown time  . vitamin B-12 (CYANOCOBALAMIN) 1000 MCG tablet Take 1,000 mcg by mouth 2 (two) times daily.    07/10/2014 at Unknown time   Scheduled: . acetaminophen  650 mg Oral Once  . clopidogrel  75 mg Oral Daily  . famotidine  20 mg Oral Daily  . FLUoxetine  20 mg Oral Daily  . lisinopril  10 mg Oral BID  . metoprolol succinate  25 mg Oral QHS  . sodium chloride  3 mL Intravenous Q12H     ROS:  Unable to obtain ROS due to mental status .      Blood pressure 140/48, pulse 57, temperature 97.8 F (36.6 C), temperature source Oral, resp. rate 17, weight 62.778 kg (138 lb 6.4 oz), SpO2 97 %.   Neurologic Examination:                                                                                                      HEENT-  Normocephalic, no lesions, without obvious abnormality.  Normal external eye and conjunctiva.  Normal TM's bilaterally.  Normal auditory canals and external ears. Normal external nose, mucus membranes and septum.  Normal pharynx. Cardiovascular- S1, S2 normal,  pulses palpable throughout   Lungs- tachypnea is noted Abdomen- normal findings: bowel sounds normal Extremities- no edema Lymph-no adenopathy palpable Musculoskeletal-no joint tenderness, deformity or swelling Skin-warm and dry, no hyperpigmentation, vitiligo, or suspicious lesions  Neurological Examination Mental Status: Alert, not oriented.  Speech fluent when repeating my words but unable to hold conversation and follow verbal commands.   Cranial Nerves: II: Discs flat bilaterally;blinks to threat bilaterally, pupils equal, round, reactive to light and accommodation III,IV, VI: ptosis not present, extra-ocular motions intact bilaterally V,VII: smile symmetric, facial light touch sensation normal bilaterally VIII: hearing normal bilaterally IX,X: uvula rises symmetrically XI: bilateral shoulder shrug XII: midline tongue extension Motor: Moving all extremities antigravity Sensory: Pinprick and light touch intact throughout, bilaterally Deep Tendon Reflexes: 1+ and symmetric throughout Plantars: Right: downgoing   Left: downgoing Cerebellar: Unable to obtain Gait: unable to obtain.       Lab Results: Basic Metabolic Panel:  Recent Labs Lab 07/10/14 1837 07/11/14 0448  NA 132* 132*  K 3.8 4.0  CL 98* 100*  CO2 24 25  GLUCOSE 101* 129*  BUN 11 10  CREATININE 0.96 1.05*  CALCIUM 9.2 9.1    Liver Function Tests:  Recent Labs Lab 07/10/14 1837  AST 24  ALT 25  ALKPHOS 56  BILITOT 0.4  PROT 6.2*  ALBUMIN 3.5   No results for input(s): LIPASE, AMYLASE in the last 168 hours. No results for input(s): AMMONIA in the last 168 hours.  CBC:  Recent Labs Lab 07/10/14 1837 07/11/14 0448  WBC 5.5 7.6  HGB 12.6 12.6  HCT 35.6* 36.0  MCV 89.9 89.8  PLT 237 219    Cardiac Enzymes: No results for input(s): CKTOTAL, CKMB, CKMBINDEX, TROPONINI in the last 168 hours.  Lipid Panel: No results for input(s): CHOL, TRIG, HDL, CHOLHDL, VLDL, LDLCALC in the last  168 hours.  CBG:  Recent Labs Lab 07/10/14 1843 07/11/14 0758  GLUCAP 103* 117*    Microbiology: Results for orders placed or performed during the hospital encounter of 02/09/14  Culture, Urine     Status: None   Collection Time: 02/11/14  9:26 PM  Result Value Ref Range Status   Specimen Description URINE, CLEAN CATCH  Final   Special Requests NONE  Final   Colony Count   Final    >=100,000 COLONIES/ML Performed at [employer]   Final  ESCHERICHIA COLI Performed at [facility]    Report Status 02/14/2014 FINAL  Final   Organism ID, Bacteria ESCHERICHIA COLI  Final      Susceptibility   Escherichia coli - MIC*    AMPICILLIN <=2 SENSITIVE Sensitive     CEFAZOLIN <=4 SENSITIVE Sensitive     CEFTRIAXONE <=1 SENSITIVE Sensitive     CIPROFLOXACIN <=0.25 SENSITIVE Sensitive     GENTAMICIN <=1 SENSITIVE Sensitive     LEVOFLOXACIN <=0.12 SENSITIVE Sensitive     NITROFURANTOIN <=16 SENSITIVE Sensitive     TOBRAMYCIN <=1 SENSITIVE Sensitive     TRIMETH/SULFA <=20 SENSITIVE Sensitive     PIP/TAZO <=4 SENSITIVE Sensitive     * ESCHERICHIA COLI    Coagulation Studies:  Recent Labs  07/11/14 0448  LABPROT 14.0  INR 1.06    Imaging: Dg Chest 2 View  07/10/2014   CLINICAL DATA:  Acute onset of altered mental status. Multiple falls. Initial encounter.  EXAM: CHEST  2 VIEW  COMPARISON:  Chest radiograph performed 07/01/2014  FINDINGS: The lungs are well-aerated. Minimal bilateral atelectasis is noted. There is no evidence of pleural effusion or pneumothorax.  The heart is mildly enlarged. No acute osseous abnormalities are seen.  IMPRESSION: Mild cardiomegaly; minimal bilateral atelectasis seen.   Electronically Signed   By: Garald Balding M.D.   On: 07/10/2014 21:25   Mr Brain Wo Contrast  07/11/2014   CLINICAL DATA:  Altered mental status, unsteady gait since discharge from hospital last Saturday. RIGHT leg weakness. History of stroke,  hypertension, hyperlipidemia.  EXAM: MRI HEAD WITHOUT CONTRAST  TECHNIQUE: Multiplanar, multiecho pulse sequences of the brain and surrounding structures were obtained without intravenous contrast.  COMPARISON:  MRI of the brain July 01, 2014  FINDINGS: No reduced diffusion to suggest acute ischemia. Faint susceptibility artifact in spurious reduced diffusion in LEFT frontal lobe corresponding to sub cm evolving intraparenchymal hematoma. Innumerable punctate small foci of susceptibility artifact throughout the supratentorial and to lesser extent infratentorial brain, present on prior imaging. No midline shift, mass effect or mass lesions. LEFT inferior cerebellar encephalomalacia. Similar non expansile LEFT temporal and LEFT frontal FLAIR T2 hyperintense signal, to lesser extent LEFT mesial occipital lobe. Ventricles and sulci are overall normal for patient's age, persistent mild mass effect on the frontal horn of LEFT lateral ventricle. No midline shift.  No abnormal extra-axial fluid collections. Normal major intracranial vascular flow voids seen at the skull base.  Layering air-fluid levels within the sphenoid sinus. Ocular globes and orbital contents are unremarkable. No abnormal sellar expansion. No cerebellar tonsillar ectopia. Patient is edentulous.  IMPRESSION: No acute intracranial process.  Findings of amyloid angiopathy (though there may be a component chronic hypertension) as noted on prior examination. Persistent vasogenic edema LEFT frontal and LEFT temporal lobes, with evolving LEFT small frontal lobe intraparenchymal hematoma. Though unlikely gliomatosis cerebri is not excluded, recommend follow-up if symptoms do not improve.  Old LEFT PICA infarct.  Acute sphenoid sinusitis.   Electronically Signed   By: Elon Alas M.D.   On: 07/11/2014 02:34   Ct Abdomen Pelvis W Contrast  07/11/2014   CLINICAL DATA:  Status post fall. Right flank pain and right leg weakness. Initial encounter.  EXAM: CT  ABDOMEN AND PELVIS WITH CONTRAST  TECHNIQUE: Multidetector CT imaging of the abdomen and pelvis was performed using the standard protocol following bolus administration of intravenous contrast.  CONTRAST:  188mL OMNIPAQUE IOHEXOL 300 MG/ML  SOLN  COMPARISON:  CT of the abdomen and  pelvis from 01/16/2014  FINDINGS: Minimal bibasilar atelectasis is noted.  The liver and spleen are unremarkable in appearance. The gallbladder is within normal limits. The pancreas and adrenal glands are unremarkable.  Mild left renal atrophy is noted. Left-sided perinephric stranding is appreciated. The right kidney is unremarkable in appearance. There is no evidence of hydronephrosis. No renal or ureteral stones are seen. Mild prominence of the right ureter is nonspecific, without evidence of distal obstructing stone.  No free fluid is identified. The small bowel is unremarkable in appearance. The stomach is within normal limits. No acute vascular abnormalities are seen. Diffuse calcification is noted along the abdominal aorta and its branches.  The appendix appears to be normal in caliber, though difficult to fully characterize. There is no evidence of appendicitis. Scattered diverticulosis is noted along the proximal sigmoid colon, without evidence of diverticulitis.  The bladder is moderately distended and grossly unremarkable. The patient is status post hysterectomy. No suspicious adnexal masses are seen. No inguinal lymphadenopathy is seen. There is mild atrophy of the paraspinal musculature.  No acute osseous abnormalities are identified.  IMPRESSION: 1. No acute abnormality seen to explain the patient's symptoms. 2. Mild left renal atrophy noted. 3. Diffuse calcification along the abdominal aorta and its branches. 4. Scattered diverticulosis along the proximal sigmoid colon, without evidence of diverticulitis. 5. Mild atrophy of the paraspinal musculature.   Electronically Signed   By: Garald Balding M.D.   On: 07/11/2014 01:25    Ct Hip Right Wo Contrast  07/11/2014   CLINICAL DATA:  Recent fall with right leg weakness and pain.  EXAM: CT OF THE RIGHT HIP WITHOUT CONTRAST  TECHNIQUE: Images of the right hip were reconstructed with thin cuts from an abdomen/pelvis CT on 07/11/2014 with IV contrast.  COMPARISON:  01/16/2014  FINDINGS: No significant soft tissue swelling in the right groin or proximal right thigh. Images of the right lower abdomen and right pelvis are unremarkable. Atherosclerotic calcifications in right iliac arteries without significant stenosis. Proximal right femoral arteries are patent. Uterus appears to be surgically absent. There is osteopenia in the pelvic bones, particularly involving the sacrum. The right side of sacrum is intact. Normal appearance of the right sacroiliac joint. The right hip is located without a fracture. Right acetabulum is intact. The right pubic rami are intact.  IMPRESSION: No acute bone abnormality involving the right hip.   Electronically Signed   By: Markus Daft M.D.   On: 07/11/2014 09:59    Etta Quill PA-C [facility] [phone]  07/11/2014, 10:48 AM   Assessment/Plan:  81 YO female with dementia and small left frontal hematoma and findings suggestive of angiopathy.  No significant changes since last MRI. Family is desiring comfort palliative care and desires to have no interventions.  Discussion with family about MRI and no significant changes were found.  Family satisfied with conversation. At this time patient is on Palliative care and neurology will sign off, but remain available if follow-up is clinically indicated.  I personally participated in this patient's evaluation and management, including formulating the above clinical impression and management recommendations.  Rush Farmer M.D. [facility] [phone]

## 2014-07-12 ENCOUNTER — Telehealth: Payer: Self-pay | Admitting: Family

## 2014-07-12 ENCOUNTER — Encounter (HOSPITAL_COMMUNITY): Payer: Self-pay | Admitting: Internal Medicine

## 2014-07-12 ENCOUNTER — Inpatient Hospital Stay (HOSPITAL_COMMUNITY): Payer: Medicare Other

## 2014-07-12 DIAGNOSIS — F0391 Unspecified dementia with behavioral disturbance: Secondary | ICD-10-CM

## 2014-07-12 DIAGNOSIS — C9110 Chronic lymphocytic leukemia of B-cell type not having achieved remission: Secondary | ICD-10-CM | POA: Insufficient documentation

## 2014-07-12 DIAGNOSIS — R52 Pain, unspecified: Secondary | ICD-10-CM | POA: Diagnosis present

## 2014-07-12 DIAGNOSIS — R627 Adult failure to thrive: Secondary | ICD-10-CM

## 2014-07-12 DIAGNOSIS — I471 Supraventricular tachycardia, unspecified: Secondary | ICD-10-CM | POA: Diagnosis present

## 2014-07-12 LAB — URINE CULTURE: Culture: NO GROWTH

## 2014-07-12 MED ORDER — BISACODYL 10 MG RE SUPP: 10.0000 mg | Freq: Every day | RECTAL | Status: DC | PRN

## 2014-07-12 MED ORDER — CEFTRIAXONE SODIUM IN DEXTROSE 20 MG/ML IV SOLN: 1.0000 g | INTRAVENOUS | Status: DC

## 2014-07-12 NOTE — Telephone Encounter (Signed)
[name]-- did you receive FL2 from [facility] for correction?  Melissa said she has not seen the message re: needed corrections?  Thanks!

## 2014-07-12 NOTE — Telephone Encounter (Signed)
Lets see how it goes with hospice referral.  If necessary I can make needed changes to [facility]- this message never did come back to me though re: changes.

## 2014-07-12 NOTE — Progress Notes (Signed)
Daily Progress Note   Patient Name: [name]       Date: 07/12/2014 DOB: 1933/10/23  Age: 81 y.o. MRN#: [id number] Attending Physician: Thurnell Lose, MD Primary Care Physician: [name], NP Admit Date: 07/10/2014  Reason for Consultation/Follow-up: Disposition, Establishing goals of care, Non pain symptom management, Pain control and Psychosocial/spiritual support  Subjective:     Continued conversation with family regarding diagnosis (dementia/failure to thrive), prognosis, natural trajectory of dementia specific to time at EOL.    Focus of care is full comfort, no life prolonging measures   Length of Stay: 2 days  Current Medications: Scheduled Meds:  . acetaminophen  650 mg Oral Once  . clopidogrel  75 mg Oral Daily  . famotidine  20 mg Oral Daily  . FLUoxetine  20 mg Oral Daily  . lisinopril  10 mg Oral BID  . metoprolol succinate  25 mg Oral QHS  . sodium chloride  3 mL Intravenous Q12H    Continuous Infusions: . sodium chloride 10 mL/hr at 07/12/14 0612    PRN Meds: LORazepam, morphine CONCENTRATE, ondansetron **OR** ondansetron (ZOFRAN) IV  Palliative Performance Scale: 30 %     Vital Signs: BP 151/52 mmHg  Pulse 68  Temp(Src) 97.8 F (36.6 C) (Oral)  Resp 14  Wt 62.778 kg (138 lb 6.4 oz)  SpO2 95% SpO2: SpO2: 95 % O2 Device: O2 Device: Not Delivered O2 Flow Rate:    Intake/output summary:  Intake/Output Summary (Last 24 hours) at 07/12/14 1047 Last data filed at 07/12/14 0612  Gross per 24 hour  Intake 291.67 ml  Output   2000 ml  Net -1708.33 ml   LBM:   Baseline Weight: Weight: 62.778 kg (138 lb 6.4 oz) Most recent weight: Weight: 62.778 kg (138 lb 6.4 oz)  Physical Exam:            General: chronically ill appearing, confused, unable to follow commands HEENT: dry buccal membranes CVS: RRR Resp: decreased in bases Abd: soft NT +BS Skin: warm and dry    Additional Data Reviewed: Recent Labs     07/10/14  1837   07/11/14  0448  WBC  5.5  7.6  HGB  12.6  12.6  PLT  237  219  NA  132*  132*  BUN  11  10  CREATININE  0.96  1.05*     Problem List:  Patient Active Problem List   Diagnosis Date Noted  . DNR (do not resuscitate) discussion 07/11/2014  . Palliative care encounter 07/11/2014  . Weakness generalized 07/11/2014  . Progressive neurologic decline 07/10/2014  . Right flank pain 07/10/2014  . Acute encephalopathy 07/10/2014  . Right leg weakness 07/10/2014  . 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 Subdural hematoma 07/01/2014  . CVA (cerebral infarction) 06/29/2014  . Loss of weight 06/29/2014  . Gait disorder 06/16/2014  . Dementia with behavioral disturbance 06/09/2014  . Preventative health care 05/03/2014  . Hearing loss 05/03/2014  . Orthostatic hypotension 04/17/2014  . HLD (hyperlipidemia) 04/12/2014  . Intracranial vascular stenosis 04/12/2014  . Syncope and collapse 04/12/2014  . Weakness   . Vasovagal near syncope 04/06/2014  . Depression 03/31/2014  . GERD (gastroesophageal reflux disease) 03/31/2014  . Pica 02/09/2014  . 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 Malnutrition of moderate degree 02/06/2014  . Obstructive hydrocephalus   . Malnutrition   . Hypokalemia   . Occlusion of posterior inferior cerebellar artery with infarction   .  Cerebellar stroke   . Nausea without vomiting   . Acute CVA (cerebrovascular accident) 01/31/2014  . Essential hypertension 01/31/2014  . Hyperlipemia   . Dementia   . Cerebral hemorrhage, nontraumatic   . History of CVA (cerebrovascular accident)   . Ureterocele, acquired      Palliative Care Assessment & Plan    Code Status:  DNR  Goals of Care   Focus is comfort, quality and dignity.  No life prolonging measures.   3. Symptom Management:   Pain/Dyspnea: Roxanol 5 mg po/sl every 2 hrs prn  Agitation: Ativan  1 mg po/sl every 6 hrs prn  4. Palliative Prophylaxis:   Stool  [place]: Dulcolax supp daily prn pr  5. Prognosis: Likley less than 3-4 weeks if she continues with only sips and bites for po intake.  No artifical feeding or hydration, no antibiotic use and no re hospitalizations.  5. Discharge Planning: Hospice facility--family is hopeful that patient is deemed eligible and open to [facility] or [facility], they plan to augment care with private CNAs   Care plan was discussed with LCSW  Thank you for allowing the Palliative Medicine Team to assist in the care of this patient.   Time In: 1000 Time Out: 1050 Total Time 50 minutes Prolonged Time Billed  no     Greater than 50%  of this time was spent counseling and coordinating care related to the above assessment and plan.     Knox Royalty, NP  07/12/2014, 10:47 AM  Please contact Palliative Medicine Team phone at [phone] for questions and concerns.

## 2014-07-12 NOTE — Clinical Social Work Note (Signed)
CSW met with family and Wadie Lessen to discuss [facility] and discharge plans. Family agreeable to fax patient out to skilled facilities. Full assessment to follow.   Lysle Yero Givens, MSW, LCSW Licensed Clinical Social Worker [facility] [phone]

## 2014-07-12 NOTE — Care Management (Signed)
Important Message  Patient Details  Name: [name] MRN: [id number] Date of Birth: 17-Mar-1933   Medicare Important Message Given:  Yes-second notification given    Loann Quill 07/12/2014, 11:09 AM

## 2014-07-12 NOTE — Telephone Encounter (Signed)
Spoke with pt's daughter, [name]. She states that pt still in hospital and social worker there is unsure if pt will qualify for Hospice services but they are trying to get her with them through [facility]. She wants [place] to place a hold on [facility] assisted living FL2 at this time and will let us know if pt doesn't qualify for Hospice as she would then want [place] to proceed with [facility] forms. I advised her that I thought FL2 had already been completed and faxed back to [facility] but I see phone note on 07/07/14 that stated corrections were needed and am unsure if that has been completed?Marland Kitchen

## 2014-07-12 NOTE — Telephone Encounter (Signed)
Could you please call daughter this AM and make sure that they have been contacted by home hospice?

## 2014-07-12 NOTE — Telephone Encounter (Signed)
Caller name: Zella Richer  Relation to pt: son in law  Call back number: [phone]   Reason for call:   Pt is currently admitted to [facility] and will be discharged to hospice. Further information from wife Karna Christmas)  would like to discuss please follow up with a call.

## 2014-07-12 NOTE — Progress Notes (Deleted)
Patient returned from HD with a temp 101.1. Patient covered in multiple blankets. After removing blankets, rechecked temp 100.8. Dr. Candiss Norse notified. New orders received.  [name], BSN, RN-BC.

## 2014-07-12 NOTE — Progress Notes (Signed)
Patient Demographics:    [name], is a 81 y.o. female, DOB - [birth year], KGU:[id number]  Admit date - 07/10/2014   Admitting Physician Ivor Costa, MD  Outpatient Primary MD for the patient is [name], NP  LOS - 2   Chief Complaint  Patient presents with  . Altered Mental Status        Subjective:    [name] today has, No headache, No chest pain, No abdominal pain - No Nausea, No new weakness tingling or numbness, No Cough - SOB. Mild R sided weakness.    Assessment  & Plan :     1.R sided weakness acute on chronic with some worsening - MRI shows amyloid angiopathy. No acute stroke. Persistent vasogenic edema LEFT frontal and LEFT temporal lobes, with evolving LEFT small frontal lobe intraparenchymal hematoma. Neurology on board, recommendations from asked admission where supportive care directed towards comfort, continue Plavix per neurology, continue supportive care with care directed towards comfort, parity of care involved, patient and family wanted comfort measures with gentle medical treatment. Will likely be placed to SNF with palliative care.   2. Questionable dysphagia. Likely due to #1 above. Speech to eval. On dysphagia 1 diet for now.   3. Essential hypertension. Continue ACE inhibitor at present dose.   4. GERD. On PPI continue.   5. Fall 1 week ago with right-sided hip pain. X-ray and CT do not show any fracture, supportive care.    Code Status : DNR  Family Communication  : Daughter POA, son, son in law  Disposition Plan  : SNF with Pall care  Consults  :  Neuro  Procedures  :   MRI Brain - amyloid angiopathy. No acute stroke. Persistent vasogenic edema LEFT frontal and LEFT temporal lobes, with evolving LEFT small frontal lobe intraparenchymal  hematoma  DVT Prophylaxis  :  SCDs    Lab Results  Component Value Date   PLT 219 07/11/2014    Inpatient Medications  Scheduled Meds: . acetaminophen  650 mg Oral Once  . clopidogrel  75 mg Oral Daily  . famotidine  20 mg Oral Daily  . FLUoxetine  20 mg Oral Daily  . lisinopril  10 mg Oral BID  . metoprolol succinate  25 mg Oral QHS  . sodium chloride  3 mL Intravenous Q12H   Continuous Infusions: . sodium chloride 10 mL/hr at 07/12/14 0612   PRN Meds:.LORazepam, morphine CONCENTRATE, ondansetron **OR** ondansetron (ZOFRAN) IV  Antibiotics  :     Anti-infectives    None        Objective:   Filed Vitals:   07/11/14 0907 07/11/14 1844 07/11/14 2211 07/12/14 0609  BP: 140/48 149/50 163/53 151/52  Pulse: 57 63 62 68  Temp: 97.8 F (36.6 C) 98 F (36.7 C) 98.2 F (36.8 C) 97.8 F (36.6 C)  TempSrc: Oral Oral Oral Oral  Resp: 17 18 17 14   Weight:      SpO2: 97% 95% 96% 95%    Wt Readings from Last 3 Encounters:  07/10/14 62.778 kg (138 lb 6.4 oz)  07/01/14 51.755 kg (114 lb 1.6 oz)  06/30/14 53.524 kg (118 lb)     Intake/Output Summary (Last 24 hours) at 07/12/14 1049 Last data filed  at 07/12/14 0612  Gross per 24 hour  Intake 291.67 ml  Output   2000 ml  Net -1708.33 ml     Physical Exam  Awake, confused , R side 4/5  .AT,PERRAL Supple Neck,No JVD, No cervical lymphadenopathy appriciated.  Symmetrical Chest wall movement, Good air movement bilaterally, CTAB RRR,No Gallops,Rubs or new Murmurs, No Parasternal Heave +ve B.Sounds, Abd Soft, No tenderness, No organomegaly appriciated, No rebound - guarding or rigidity. No Cyanosis, Clubbing or edema, No new Rash or bruise       Data Review:   Micro Results Recent Results (from the past 240 hour(s))  Urine culture     Status: None   Collection Time: 07/10/14  9:28 PM  Result Value Ref Range Status   Specimen Description URINE, CATHETERIZED  Final   Special Requests NONE  Final   Culture NO  GROWTH 2 DAYS  Final   Report Status 07/12/2014 FINAL  Final    Radiology Reports Dg Chest 2 View  07/10/2014   CLINICAL DATA:  Acute onset of altered mental status. Multiple falls. Initial encounter.  EXAM: CHEST  2 VIEW  COMPARISON:  Chest radiograph performed 07/01/2014  FINDINGS: The lungs are well-aerated. Minimal bilateral atelectasis is noted. There is no evidence of pleural effusion or pneumothorax.  The heart is mildly enlarged. No acute osseous abnormalities are seen.  IMPRESSION: Mild cardiomegaly; minimal bilateral atelectasis seen.   Electronically Signed   By: Garald Balding M.D.   On: 07/10/2014 21:25   Dg Chest 2 View  07/01/2014   CLINICAL DATA:  Patient with altered mental status.  Prior stroke.  EXAM: CHEST  2 VIEW  COMPARISON:  Chest radiograph 04/06/2014  FINDINGS: Stable enlarged cardiac and mediastinal contours. No consolidative pulmonary opacities. No pleural effusion or pneumothorax. Mid thoracic spine degenerative changes.  IMPRESSION: No acute cardiopulmonary process.   Electronically Signed   By: Lovey Newcomer M.D.   On: 07/01/2014 01:22   Dg Ribs Unilateral W/chest Right  07/08/2014   CLINICAL DATA:  [place] Pain in Rigfht ribs( region of lateral midribs near bottom of right breastand right hip Near right iliac wing  EXAM: RIGHT RIBS AND CHEST - 3+ VIEW  COMPARISON:  07/01/2014  FINDINGS: No rib fracture or rib lesion.  Clear lungs.  No pleural effusion or pneumothorax.  Cardiac silhouette is normal in size and configuration. No mediastinal or hilar masses or evidence of adenopathy.  IMPRESSION: 1. No rib fracture or rib lesion. 2. No acute cardiopulmonary disease.   Electronically Signed   By: Lajean Manes M.D.   On: 07/08/2014 12:27   Ct Head Wo Contrast  06/29/2014   CLINICAL DATA:  TIA.  Recurrent symptoms.  EXAM: CT HEAD WITHOUT CONTRAST  TECHNIQUE: Contiguous axial images were obtained from the base of the skull through the vertex without intravenous contrast.  COMPARISON:   CT head 04/06/2014.  MRI 04/07/2014  FINDINGS: Chronic infarct left inferior cerebellum is unchanged.  New area of hypodensity in the left lateral and posterior temporal lobe. It is difficult determine if this represents vasogenic edema versus interval infarction.  New hypodensity left frontal lobe involving predominately white matter. This could represent vasogenic edema versus subacute infarct. No definite mass lesion identified.  Generalized atrophy. Negative for hydrocephalus. Negative for intracranial hemorrhage  Calvarium intact  IMPRESSION: Chronic left PICA infarct unchanged  New areas of low-density in the left frontal lobe and left temporal lobe not present on the prior studies. This could represent subacute infarct  however vasogenic edema from tumor is in the differential. Follow-up MRI brain with contrast recommended for further evaluation.   Electronically Signed   By: Franchot Gallo M.D.   On: 06/29/2014 08:37   Mr Brain Wo Contrast  07/11/2014   CLINICAL DATA:  Altered mental status, unsteady gait since discharge from hospital last Saturday. RIGHT leg weakness. History of stroke, hypertension, hyperlipidemia.  EXAM: MRI HEAD WITHOUT CONTRAST  TECHNIQUE: Multiplanar, multiecho pulse sequences of the brain and surrounding structures were obtained without intravenous contrast.  COMPARISON:  MRI of the brain July 01, 2014  FINDINGS: No reduced diffusion to suggest acute ischemia. Faint susceptibility artifact in spurious reduced diffusion in LEFT frontal lobe corresponding to sub cm evolving intraparenchymal hematoma. Innumerable punctate small foci of susceptibility artifact throughout the supratentorial and to lesser extent infratentorial brain, present on prior imaging. No midline shift, mass effect or mass lesions. LEFT inferior cerebellar encephalomalacia. Similar non expansile LEFT temporal and LEFT frontal FLAIR T2 hyperintense signal, to lesser extent LEFT mesial occipital lobe. Ventricles and  sulci are overall normal for patient's age, persistent mild mass effect on the frontal horn of LEFT lateral ventricle. No midline shift.  No abnormal extra-axial fluid collections. Normal major intracranial vascular flow voids seen at the skull base.  Layering air-fluid levels within the sphenoid sinus. Ocular globes and orbital contents are unremarkable. No abnormal sellar expansion. No cerebellar tonsillar ectopia. Patient is edentulous.  IMPRESSION: No acute intracranial process.  Findings of amyloid angiopathy (though there may be a component chronic hypertension) as noted on prior examination. Persistent vasogenic edema LEFT frontal and LEFT temporal lobes, with evolving LEFT small frontal lobe intraparenchymal hematoma. Though unlikely gliomatosis cerebri is not excluded, recommend follow-up if symptoms do not improve.  Old LEFT PICA infarct.  Acute sphenoid sinusitis.   Electronically Signed   By: Elon Alas M.D.   On: 07/11/2014 02:34   Mr Brain Wo Contrast  06/30/2014   CLINICAL DATA:  81 year old hypertensive female with history of hyperlipidemia and dementia presenting with increasing confusion over the past 2 weeks and possible stroke. Abnormal CT. Subsequent encounter.  EXAM: MRI HEAD WITHOUT CONTRAST  TECHNIQUE: Multiplanar, multiecho pulse sequences of the brain and surrounding structures were obtained without intravenous contrast.  COMPARISON:  06/28/2014 CT.  04/07/2014 MR.  FINDINGS: Representing a significant change from the relatively recent MR (04/07/2014) is the presence of prominent vasogenic edema within the anterior superior left frontal lobe and the anterior to mid left temporal lobe. Along the periphery are scattered blood breakdown products with focal 9 mm hematoma superior anterior left frontal lobe. Etiology is indeterminate. Mild local mass effect with slight inferior displacement of the left lateral ventricle.  This may reflect changes of amyloid angiography. Cortical vein  thrombosis could cause a similar appearance although the major dural sinuses are patent. Metastatic disease is not entirely excluded. Contrast was not administered. Infection does not appear to be the case clinically and the MR findings are not indicative of such.  Patient would benefit from contrast-enhanced imaging to exclude underlying lesion in addition to followup imaging to help demonstrate that the vasogenic clears as would be expected with a benign process.  No acute thrombotic infarct.  Remote partially hemorrhagic infarct mid to inferior left cerebellum.  Mild small vessel disease type changes.  Global atrophy without hydrocephalus.  Major intracranial vascular structures are patent.  IMPRESSION: Representing a significant change from the relatively recent MR (04/07/2014) is the presence of prominent vasogenic edema within the  anterior superior left frontal lobe and the anterior to mid left temporal lobe. Along the periphery are scattered blood breakdown products with focal 9 mm hematoma superior anterior left frontal lobe. Etiology is indeterminate. Mild local mass effect with slight inferior displacement of the left lateral ventricle.  This may reflect changes of amyloid angiography. Cortical vein thrombosis could cause a similar appearance although the major dural sinuses are patent. Metastatic disease is not entirely excluded.  Patient would benefit from contrast-enhanced imaging to exclude underlying lesion in addition to followup imaging to help demonstrate that the vasogenic clears as would be expected with a benign process.  No acute thrombotic infarct.  Remote partially hemorrhagic infarct mid to inferior left cerebellum.  These results were called by telephone at the time of interpretation on 06/30/2014 at 4:24 pm to [facility], Nurse Practitioner, who verbally acknowledged these results.   Electronically Signed   By: Genia Del M.D.   On: 06/30/2014 17:00   Mr Jeri Cos  Contrast  07/01/2014   CLINICAL DATA:  Worsening altered mental status for a few weeks, difficulty speaking, confusion and memory issues. History of stroke, hypertension, hyperlipidemia. Followup.  EXAM: MRI HEAD WITHOUT AND WITH CONTRAST  TECHNIQUE: Multiplanar, multiecho pulse sequences of the brain and surrounding structures were obtained without and with intravenous contrast ; limited examination, follow-up to yesterday's noncontrast MRI of the head.  CONTRAST:  77mL MULTIHANCE GADOBENATE DIMEGLUMINE 529 MG/ML IV SOLN  COMPARISON:  MRI of the head June 30, 2014 and MRI head April 07, 2014  FINDINGS: Subcentimeter focus of reduced diffusion within LEFT frontal lobe superior gyrus associated with small amount of susceptibility artifact and T1 shortening. No susceptibility artifact to suggest cortical vein thrombosis. Innumerable punctate supratentorial and cerebellar foci of susceptibility artifact. Faint irregular enhancement associated with the predominately LEFT-sided susceptibility artifact.  No abnormal extra-axial fluid collections or suspicious extra-axial enhancement, no extra-axial mass. Patchy mildly expansile T2 hyperintense signal in the LEFT frontal lobe, LEFT temporal lobe, in addition to subacute to early chronic LEFT posterior inferior cerebellar artery territory infarct.  IMPRESSION: Susceptibility artifact, sub cm LEFT frontal subacute hematoma, multifocal parenchymal edema and faint enhancement, constellation of findings are most consistent with amyloid angiography without suspicious intracranial mass.  Subacute to chronic appearing LEFT posterior inferior cerebellar artery territory infarct.   Electronically Signed   By: Elon Alas M.D.   On: 07/01/2014 05:48   Ct Abdomen Pelvis W Contrast  07/11/2014   CLINICAL DATA:  Status post fall. Right flank pain and right leg weakness. Initial encounter.  EXAM: CT ABDOMEN AND PELVIS WITH CONTRAST  TECHNIQUE: Multidetector CT imaging of the  abdomen and pelvis was performed using the standard protocol following bolus administration of intravenous contrast.  CONTRAST:  196mL OMNIPAQUE IOHEXOL 300 MG/ML  SOLN  COMPARISON:  CT of the abdomen and pelvis from 01/16/2014  FINDINGS: Minimal bibasilar atelectasis is noted.  The liver and spleen are unremarkable in appearance. The gallbladder is within normal limits. The pancreas and adrenal glands are unremarkable.  Mild left renal atrophy is noted. Left-sided perinephric stranding is appreciated. The right kidney is unremarkable in appearance. There is no evidence of hydronephrosis. No renal or ureteral stones are seen. Mild prominence of the right ureter is nonspecific, without evidence of distal obstructing stone.  No free fluid is identified. The small bowel is unremarkable in appearance. The stomach is within normal limits. No acute vascular abnormalities are seen. Diffuse calcification is noted along the abdominal aorta and its  branches.  The appendix appears to be normal in caliber, though difficult to fully characterize. There is no evidence of appendicitis. Scattered diverticulosis is noted along the proximal sigmoid colon, without evidence of diverticulitis.  The bladder is moderately distended and grossly unremarkable. The patient is status post hysterectomy. No suspicious adnexal masses are seen. No inguinal lymphadenopathy is seen. There is mild atrophy of the paraspinal musculature.  No acute osseous abnormalities are identified.  IMPRESSION: 1. No acute abnormality seen to explain the patient's symptoms. 2. Mild left renal atrophy noted. 3. Diffuse calcification along the abdominal aorta and its branches. 4. Scattered diverticulosis along the proximal sigmoid colon, without evidence of diverticulitis. 5. Mild atrophy of the paraspinal musculature.   Electronically Signed   By: Garald Balding M.D.   On: 07/11/2014 01:25   Ct Hip Right Wo Contrast  07/11/2014   CLINICAL DATA:  Recent fall with  right leg weakness and pain.  EXAM: CT OF THE RIGHT HIP WITHOUT CONTRAST  TECHNIQUE: Images of the right hip were reconstructed with thin cuts from an abdomen/pelvis CT on 07/11/2014 with IV contrast.  COMPARISON:  01/16/2014  FINDINGS: No significant soft tissue swelling in the right groin or proximal right thigh. Images of the right lower abdomen and right pelvis are unremarkable. Atherosclerotic calcifications in right iliac arteries without significant stenosis. Proximal right femoral arteries are patent. Uterus appears to be surgically absent. There is osteopenia in the pelvic bones, particularly involving the sacrum. The right side of sacrum is intact. Normal appearance of the right sacroiliac joint. The right hip is located without a fracture. Right acetabulum is intact. The right pubic rami are intact.  IMPRESSION: No acute bone abnormality involving the right hip.   Electronically Signed   By: Markus Daft M.D.   On: 07/11/2014 09:59   Dg Hip Unilat With Pelvis 2-3 Views Right  07/08/2014   CLINICAL DATA:  Fall, right pelvic pain near iliac wing.  EXAM: RIGHT HIP (WITH PELVIS) 2-3 VIEWS  COMPARISON:  None.  FINDINGS: No acute bony abnormality. Specifically, no fracture, subluxation, or dislocation. Soft tissues are intact. Early joint space narrowing in the hip joints bilaterally. SI joints are symmetric and unremarkable.  IMPRESSION: No acute bony abnormality.   Electronically Signed   By: Rolm Baptise M.D.   On: 07/08/2014 12:29     CBC  Recent Labs Lab 07/10/14 1837 07/11/14 0448  WBC 5.5 7.6  HGB 12.6 12.6  HCT 35.6* 36.0  PLT 237 219  MCV 89.9 89.8  MCH 31.8 31.4  MCHC 35.4 35.0  RDW 12.4 12.5    Chemistries   Recent Labs Lab 07/10/14 1837 07/11/14 0448  NA 132* 132*  K 3.8 4.0  CL 98* 100*  CO2 24 25  GLUCOSE 101* 129*  BUN 11 10  CREATININE 0.96 1.05*  CALCIUM 9.2 9.1  AST 24  --   ALT 25  --   ALKPHOS 56  --   BILITOT 0.4  --     ------------------------------------------------------------------------------------------------------------------ estimated creatinine clearance is 35.9 mL/min (by C-G formula based on Cr of 1.05). ------------------------------------------------------------------------------------------------------------------ No results for input(s): HGBA1C in the last 72 hours. ------------------------------------------------------------------------------------------------------------------ No results for input(s): CHOL, HDL, LDLCALC, TRIG, CHOLHDL, LDLDIRECT in the last 72 hours. ------------------------------------------------------------------------------------------------------------------ No results for input(s): TSH, T4TOTAL, T3FREE, THYROIDAB in the last 72 hours.  Invalid input(s): FREET3 ------------------------------------------------------------------------------------------------------------------ No results for input(s): VITAMINB12, FOLATE, FERRITIN, TIBC, IRON, RETICCTPCT in the last 72 hours.  Coagulation profile  Recent Labs Lab  07/11/14 0448  INR 1.06    No results for input(s): DDIMER in the last 72 hours.  Cardiac Enzymes No results for input(s): CKMB, TROPONINI, MYOGLOBIN in the last 168 hours.  Invalid input(s): CK ------------------------------------------------------------------------------------------------------------------ Invalid input(s): POCBNP   Time Spent in minutes   35   SINGH,PRASHANT K M.D on 07/12/2014 at 10:49 AM  Between 7am to 7pm - Pager - [phone]  After 7pm go to [email or web address] - password [facility]  [facility]   Office  [phone]

## 2014-07-12 NOTE — Consult Note (Signed)
[facility] Liaison:  Received request 07/12/14 from CSW Vanessa for family interest in [facility]. Chart reviewed and met with daughter [name] and son David to confirm interest, explain services and offer support. They are agreeable for [facility] physician to review clinical information. Will follow up with family and CSW later this afternoon re eligibility and availability. They have my contact information if needed. Thank you. Jalysa Swopes LCSW [phone] 

## 2014-07-13 ENCOUNTER — Telehealth: Payer: Self-pay | Admitting: Family

## 2014-07-13 MED ORDER — LORAZEPAM 2 MG/ML PO CONC: 1.0000 mg | Freq: Four times a day (QID) | ORAL | Status: AC | PRN

## 2014-07-13 MED ORDER — ONDANSETRON HCL 4 MG PO TABS: 4.0000 mg | ORAL_TABLET | Freq: Four times a day (QID) | ORAL | Status: AC | PRN

## 2014-07-13 MED ORDER — FENTANYL 25 MCG/HR TD PT72
25.0000 ug | MEDICATED_PATCH | TRANSDERMAL | Status: DC
  Administered 2014-07-13: 25 ug via TRANSDERMAL
  Filled 2014-07-13: qty 1

## 2014-07-13 MED ORDER — MORPHINE SULFATE (CONCENTRATE) 10 MG/0.5ML PO SOLN: 10.0000 mg | ORAL | Status: AC | PRN

## 2014-07-13 MED ORDER — BISACODYL 10 MG RE SUPP: 10.0000 mg | Freq: Every day | RECTAL | Status: AC | PRN

## 2014-07-13 MED ORDER — FENTANYL 25 MCG/HR TD PT72: 25.0000 ug | MEDICATED_PATCH | TRANSDERMAL | Status: DC

## 2014-07-13 NOTE — Telephone Encounter (Signed)
Noted  

## 2014-07-13 NOTE — Telephone Encounter (Signed)
[facility] was wanting for the medication section to specify frequency of meds. Information was changed and faxed back. JG//CMA

## 2014-07-13 NOTE — Discharge Summary (Signed)
[name], is a 81 y.o. female  DOB 09-28-1933  MRN [id number].  Admission date:  07/10/2014  Admitting Physician  Ivor Costa, MD  Discharge Date:  07/13/2014   Primary MD  [name], NP  Recommendations for primary care physician for things to follow:   Monitor clinically. 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 Hypertension   . Hyperlipemia   . Cerebral hemorrhage, nontraumatic 2011  . Ureterocele, acquired   . Broken arm     left X 2; right X 1; 1 OR" (07/01/2014)  . Brain swelling 2016  . History of pubovaginal sling   . Depression   . Memory loss   . Confusion   . Falls frequently   . Hearing loss   . Hyperlipemia   . DVT (deep venous thrombosis)     "she has one behind her left knee" (07/01/2014)  . GERD (gastroesophageal reflux disease)   . History of hiatal hernia   . Stroke 2015 & 2016    Dec; Jan, blockage cerebellum; "balance issues; cognition issues since" (07/01/2014)   . CVA (cerebral vascular accident) 2011    left cerebellum  . Osteoporosis   . Collapsed vertebra     "one in her lower and upper back" (07/01/2014)  . Chronic back pain     "w/activity" (07/01/2014)  . Anxiety   . Chronic kidney disease (CKD), stage III (moderate)   . Dementia     "since 2011 CVA; due to micro hemorrhages"   . CLL (chronic lymphocytic leukemia)     Past Surgical History  Procedure Laterality Date  . Cholecystectomy    . Fracture surgery    . Bladder suspension  2006  . Forearm fracture surgery Left 1980's  . Abdominal hysterectomy      "partial"       HPI  from the history and physical done on the day of admission:    [name] is a 81 y.o. female with PMH of hyperlipidemia, GERD, depression, anxiety, falls, hearing loss, DVT not on anticoagulating due to falls, stroke, back pain, chronic kidney disease-status 3, dementia, stroke, recent left frontal subacute hematoma, who presents  with altered mental status, right leg weakness and right flank pain.  Patient is accompanied by her daughter. She has altered mental status, history is obtained from her daughter. Per daughter, over the past 2 weeks, patient has progressive decline. She becomes more confused. She seems to have right leg weakness and drags her right leg. She has significantly decreased mobility. She also complains of right flank pain.   Of note, she is living at home with her daughter and son in law, but they are no longer able to take care of her. She was initially set up to go to an assisted living facility but now wouldn't be able to function without 24 hour care. Family tried to contact SNFs and hospices today but were unable to secure placement in a facility. As it is a holiday weekend, they were significantly concerned that they wouldn't be able to get her anywhere for several days, so they presented to the ED.he can does not have chest pain, shortness of breath, cough, symptoms of her  UTI.  In ED, patient was found to have WBC 5.5, negative urinalysis, temperature normal, bradycardia, negative chest x-ray. Patient is admitted to inpatient for further evaluation and treatment.      Hospital Course:      1.R sided weakness acute on chronic with some worsening - MRI shows amyloid angiopathy. No acute stroke. Persistent vasogenic edema LEFT frontal and LEFT temporal lobes, with evolving LEFT small frontal lobe intraparenchymal hematoma. Neurology on board, recommendations from asked admission where supportive care directed towards comfort, continue Plavix per neurology, continue supportive care with care directed towards comfort, seen by palliative care involved, patient and family wanted comfort measures with gentle medical treatment. Will likely be placed to SNF with hospice.   2. Questionable dysphagia. Likely due to #1 above. She was seen by speech on dysphagia 1 diet with increasing assistance and full aspiration precautions.   3. Essential hypertension. Continue beta blocker and ACE inhibitor at present dose.   4. GERD. On PPI continue.   5. Fall 1 week ago with right-sided hip pain. X-ray and CT do not show any fracture, supportive care. Discussed with family in detail including daughter, they do not want to pursue any further testing asked to do not want to pursue any surgical intervention, even if that is a small occult fracture they want to pursue comfort measures and medical treatment only.      Discharge Condition: Guarded  Follow UP  Follow-up Information    Follow up with [name], NP. Schedule an appointment as soon as possible for a visit in 1 week.   Specialty:  Internal Medicine   Contact information:   [place] [address] [place] [address] [phone]        Consults obtained - Neuro, Dexter and Activity recommendation: See Discharge Instructions below  Discharge Instructions       Discharge  Instructions    Discharge instructions    Complete by:  As directed   Follow with Primary MD [name], NP in 7 days   Get CBC, CMP, 2 view Chest X ray checked  by Primary MD next visit.    Activity: As tolerated with Full fall precautions use walker/cane & assistance as needed   Disposition SNF with Hospice   Diet: Dysphagia 1 diet with feeding assistance and aspiration precautions.  For Heart failure patients - Check your Weight same time everyday, if you gain over 2 pounds, or  you develop in leg swelling, experience more shortness of breath or chest pain, call your Primary MD immediately. Follow Cardiac Low Salt Diet and 1.5 lit/day fluid restriction.   On your next visit with your primary care physician please Get Medicines reviewed and adjusted.   Please request your Prim.MD to go over all Hospital Tests and Procedure/Radiological results at the follow up, please get all Hospital records sent to your Prim MD by signing hospital release before you go home.   If you experience worsening of your admission symptoms, develop shortness of breath, life threatening emergency, suicidal or homicidal thoughts you must seek medical attention immediately by calling 911 or calling your MD immediately  if symptoms less severe.  You Must read complete instructions/literature along with all the possible adverse reactions/side effects for all the Medicines you take and that have been prescribed to you. Take any new Medicines after you have completely understood and accpet all the possible adverse reactions/side effects.   Do not drive, operating heavy machinery, perform activities at heights, swimming or participation in water activities or provide baby sitting services if your were admitted for syncope or siezures until you have seen by Primary MD or a Neurologist and advised to do so again.  Do not drive when taking Pain medications.    Do not take more than prescribed Pain, Sleep  and Anxiety Medications  Special Instructions: If you have smoked or chewed Tobacco  in the last 2 yrs please stop smoking, stop any regular Alcohol  and or any Recreational drug use.  Wear Seat belts while driving.   Please note  You were cared for by a hospitalist during your hospital stay. If you have any questions about your discharge medications or the care you received while you were in the hospital after you are discharged, you can call the unit and asked to speak with the hospitalist on call if the hospitalist that took care of you is not available. Once you are discharged, your primary care physician will handle any further medical issues. Please note that NO REFILLS for any discharge medications will be authorized once you are discharged, as it is imperative that you return to your primary care physician (or establish a relationship with a primary care physician if you do not have one) for your aftercare needs so that they can reassess your need for medications and monitor your lab values.     Increase activity slowly    Complete by:  As directed              Discharge Medications       Medication List    STOP taking these medications        alendronate 70 MG tablet  Commonly known as:  FOSAMAX     atorvastatin 80 MG tablet  Commonly known as:  LIPITOR      TAKE these medications        acetaminophen 500 MG tablet  Commonly known as:  TYLENOL  Take 1,000 mg by mouth every 6 (six) hours as needed for mild pain.     bisacodyl 10 MG suppository  Commonly known as:  DULCOLAX  Place 1 suppository (10 mg total) rectally daily as needed for mild constipation.     Calcium Carbonate-Vitamin D 600-400 MG-UNIT per tablet  Take 1 tablet by mouth 2 (two) times daily.     clopidogrel 75 MG tablet  Commonly known as:  PLAVIX  Take 1 tablet (75 mg total) by mouth  daily.     fentaNYL 25 MCG/HR patch  Commonly known as:  DURAGESIC - dosed mcg/hr  Place 1 patch (25 mcg  total) onto the skin every 3 (three) days.     FLUoxetine 20 MG tablet  Commonly known as:  PROZAC  Take 1 tablet (20 mg total) by mouth daily.     lisinopril 10 MG tablet  Commonly known as:  PRINIVIL,ZESTRIL  Take 1 tablet (10 mg total) by mouth 2 (two) times daily.     LORazepam 2 MG/ML concentrated solution  Commonly known as:  ATIVAN  Take 0.5 mLs (1 mg total) by mouth every 6 (six) hours as needed for anxiety.     metoprolol succinate 25 MG 24 hr tablet  Commonly known as:  TOPROL-XL  Take 1 tablet (25 mg total) by mouth daily.     morphine CONCENTRATE 10 MG/0.5ML Soln concentrated solution  Take 0.5 mLs (10 mg total) by mouth every 3 (three) hours as needed for moderate pain or severe pain.     multivitamin with minerals Tabs tablet  Take 1 tablet by mouth daily.     ondansetron 4 MG tablet  Commonly known as:  ZOFRAN  Take 1 tablet (4 mg total) by mouth every 6 (six) hours as needed for nausea.     ranitidine 150 MG tablet  Commonly known as:  ZANTAC  Take 1 tablet (150 mg total) by mouth at bedtime.     vitamin B-12 1000 MCG tablet  Commonly known as:  CYANOCOBALAMIN  Take 1,000 mcg by mouth 2 (two) times daily.        Major procedures and Radiology Reports - PLEASE review detailed and final reports for all details, in brief -       Dg Chest 2 View  07/10/2014   CLINICAL DATA:  Acute onset of altered mental status. Multiple falls. Initial encounter.  EXAM: CHEST  2 VIEW  COMPARISON:  Chest radiograph performed 07/01/2014  FINDINGS: The lungs are well-aerated. Minimal bilateral atelectasis is noted. There is no evidence of pleural effusion or pneumothorax.  The heart is mildly enlarged. No acute osseous abnormalities are seen.  IMPRESSION: Mild cardiomegaly; minimal bilateral atelectasis seen.   Electronically Signed   By: Garald Balding M.D.   On: 07/10/2014 21:25   Dg Chest 2 View  07/01/2014   CLINICAL DATA:  Patient with altered mental status.  Prior stroke.   EXAM: CHEST  2 VIEW  COMPARISON:  Chest radiograph 04/06/2014  FINDINGS: Stable enlarged cardiac and mediastinal contours. No consolidative pulmonary opacities. No pleural effusion or pneumothorax. Mid thoracic spine degenerative changes.  IMPRESSION: No acute cardiopulmonary process.   Electronically Signed   By: Lovey Newcomer M.D.   On: 07/01/2014 01:22   Dg Ribs Unilateral W/chest Right  07/08/2014   CLINICAL DATA:  [place] Pain in Rigfht ribs( region of lateral midribs near bottom of right breastand right hip Near right iliac wing  EXAM: RIGHT RIBS AND CHEST - 3+ VIEW  COMPARISON:  07/01/2014  FINDINGS: No rib fracture or rib lesion.  Clear lungs.  No pleural effusion or pneumothorax.  Cardiac silhouette is normal in size and configuration. No mediastinal or hilar masses or evidence of adenopathy.  IMPRESSION: 1. No rib fracture or rib lesion. 2. No acute cardiopulmonary disease.   Electronically Signed   By: Lajean Manes M.D.   On: 07/08/2014 12:27   Ct Head Wo Contrast  06/29/2014   CLINICAL DATA:  TIA.  Recurrent symptoms.  EXAM: CT HEAD WITHOUT CONTRAST  TECHNIQUE: Contiguous axial images were obtained from the base of the skull through the vertex without intravenous contrast.  COMPARISON:  CT head 04/06/2014.  MRI 04/07/2014  FINDINGS: Chronic infarct left inferior cerebellum is unchanged.  New area of hypodensity in the left lateral and posterior temporal lobe. It is difficult determine if this represents vasogenic edema versus interval infarction.  New hypodensity left frontal lobe involving predominately white matter. This could represent vasogenic edema versus subacute infarct. No definite mass lesion identified.  Generalized atrophy. Negative for hydrocephalus. Negative for intracranial hemorrhage  Calvarium intact  IMPRESSION: Chronic left PICA infarct unchanged  New areas of low-density in the left frontal lobe and left temporal lobe not present on the prior studies. This could represent subacute  infarct however vasogenic edema from tumor is in the differential. Follow-up MRI brain with contrast recommended for further evaluation.   Electronically Signed   By: Franchot Gallo M.D.   On: 06/29/2014 08:37   Mr Brain Wo Contrast  07/11/2014   CLINICAL DATA:  Altered mental status, unsteady gait since discharge from hospital last Saturday. RIGHT leg weakness. History of stroke, hypertension, hyperlipidemia.  EXAM: MRI HEAD WITHOUT CONTRAST  TECHNIQUE: Multiplanar, multiecho pulse sequences of the brain and surrounding structures were obtained without intravenous contrast.  COMPARISON:  MRI of the brain July 01, 2014  FINDINGS: No reduced diffusion to suggest acute ischemia. Faint susceptibility artifact in spurious reduced diffusion in LEFT frontal lobe corresponding to sub cm evolving intraparenchymal hematoma. Innumerable punctate small foci of susceptibility artifact throughout the supratentorial and to lesser extent infratentorial brain, present on prior imaging. No midline shift, mass effect or mass lesions. LEFT inferior cerebellar encephalomalacia. Similar non expansile LEFT temporal and LEFT frontal FLAIR T2 hyperintense signal, to lesser extent LEFT mesial occipital lobe. Ventricles and sulci are overall normal for patient's age, persistent mild mass effect on the frontal horn of LEFT lateral ventricle. No midline shift.  No abnormal extra-axial fluid collections. Normal major intracranial vascular flow voids seen at the skull base.  Layering air-fluid levels within the sphenoid sinus. Ocular globes and orbital contents are unremarkable. No abnormal sellar expansion. No cerebellar tonsillar ectopia. Patient is edentulous.  IMPRESSION: No acute intracranial process.  Findings of amyloid angiopathy (though there may be a component chronic hypertension) as noted on prior examination. Persistent vasogenic edema LEFT frontal and LEFT temporal lobes, with evolving LEFT small frontal lobe intraparenchymal  hematoma. Though unlikely gliomatosis cerebri is not excluded, recommend follow-up if symptoms do not improve.  Old LEFT PICA infarct.  Acute sphenoid sinusitis.   Electronically Signed   By: Elon Alas M.D.   On: 07/11/2014 02:34   Mr Brain Wo Contrast  06/30/2014   CLINICAL DATA:  81 year old hypertensive female with history of hyperlipidemia and dementia presenting with increasing confusion over the past 2 weeks and possible stroke. Abnormal CT. Subsequent encounter.  EXAM: MRI HEAD WITHOUT CONTRAST  TECHNIQUE: Multiplanar, multiecho pulse sequences of the brain and surrounding structures were obtained without intravenous contrast.  COMPARISON:  06/28/2014 CT.  04/07/2014 MR.  FINDINGS: Representing a significant change from the relatively recent MR (04/07/2014) is the presence of prominent vasogenic edema within the anterior superior left frontal lobe and the anterior to mid left temporal lobe. Along the periphery are scattered blood breakdown products with focal 9 mm hematoma superior anterior left frontal lobe. Etiology is indeterminate. Mild local mass effect with slight inferior displacement of the left lateral ventricle.  This  may reflect changes of amyloid angiography. Cortical vein thrombosis could cause a similar appearance although the major dural sinuses are patent. Metastatic disease is not entirely excluded. Contrast was not administered. Infection does not appear to be the case clinically and the MR findings are not indicative of such.  Patient would benefit from contrast-enhanced imaging to exclude underlying lesion in addition to followup imaging to help demonstrate that the vasogenic clears as would be expected with a benign process.  No acute thrombotic infarct.  Remote partially hemorrhagic infarct mid to inferior left cerebellum.  Mild small vessel disease type changes.  Global atrophy without hydrocephalus.  Major intracranial vascular structures are patent.  IMPRESSION:  Representing a significant change from the relatively recent MR (04/07/2014) is the presence of prominent vasogenic edema within the anterior superior left frontal lobe and the anterior to mid left temporal lobe. Along the periphery are scattered blood breakdown products with focal 9 mm hematoma superior anterior left frontal lobe. Etiology is indeterminate. Mild local mass effect with slight inferior displacement of the left lateral ventricle.  This may reflect changes of amyloid angiography. Cortical vein thrombosis could cause a similar appearance although the major dural sinuses are patent. Metastatic disease is not entirely excluded.  Patient would benefit from contrast-enhanced imaging to exclude underlying lesion in addition to followup imaging to help demonstrate that the vasogenic clears as would be expected with a benign process.  No acute thrombotic infarct.  Remote partially hemorrhagic infarct mid to inferior left cerebellum.  These results were called by telephone at the time of interpretation on 06/30/2014 at 4:24 pm to [facility], Nurse Practitioner, who verbally acknowledged these results.   Electronically Signed   By: Genia Del M.D.   On: 06/30/2014 17:00   Mr Jeri Cos Contrast  07/01/2014   CLINICAL DATA:  Worsening altered mental status for a few weeks, difficulty speaking, confusion and memory issues. History of stroke, hypertension, hyperlipidemia. Followup.  EXAM: MRI HEAD WITHOUT AND WITH CONTRAST  TECHNIQUE: Multiplanar, multiecho pulse sequences of the brain and surrounding structures were obtained without and with intravenous contrast ; limited examination, follow-up to yesterday's noncontrast MRI of the head.  CONTRAST:  14mL MULTIHANCE GADOBENATE DIMEGLUMINE 529 MG/ML IV SOLN  COMPARISON:  MRI of the head June 30, 2014 and MRI head April 07, 2014  FINDINGS: Subcentimeter focus of reduced diffusion within LEFT frontal lobe superior gyrus associated with small amount of  susceptibility artifact and T1 shortening. No susceptibility artifact to suggest cortical vein thrombosis. Innumerable punctate supratentorial and cerebellar foci of susceptibility artifact. Faint irregular enhancement associated with the predominately LEFT-sided susceptibility artifact.  No abnormal extra-axial fluid collections or suspicious extra-axial enhancement, no extra-axial mass. Patchy mildly expansile T2 hyperintense signal in the LEFT frontal lobe, LEFT temporal lobe, in addition to subacute to early chronic LEFT posterior inferior cerebellar artery territory infarct.  IMPRESSION: Susceptibility artifact, sub cm LEFT frontal subacute hematoma, multifocal parenchymal edema and faint enhancement, constellation of findings are most consistent with amyloid angiography without suspicious intracranial mass.  Subacute to chronic appearing LEFT posterior inferior cerebellar artery territory infarct.   Electronically Signed   By: Elon Alas M.D.   On: 07/01/2014 05:48   Ct Abdomen Pelvis W Contrast  07/11/2014   CLINICAL DATA:  Status post fall. Right flank pain and right leg weakness. Initial encounter.  EXAM: CT ABDOMEN AND PELVIS WITH CONTRAST  TECHNIQUE: Multidetector CT imaging of the abdomen and pelvis was performed using the standard protocol following bolus  administration of intravenous contrast.  CONTRAST:  181mL OMNIPAQUE IOHEXOL 300 MG/ML  SOLN  COMPARISON:  CT of the abdomen and pelvis from 01/16/2014  FINDINGS: Minimal bibasilar atelectasis is noted.  The liver and spleen are unremarkable in appearance. The gallbladder is within normal limits. The pancreas and adrenal glands are unremarkable.  Mild left renal atrophy is noted. Left-sided perinephric stranding is appreciated. The right kidney is unremarkable in appearance. There is no evidence of hydronephrosis. No renal or ureteral stones are seen. Mild prominence of the right ureter is nonspecific, without evidence of distal obstructing  stone.  No free fluid is identified. The small bowel is unremarkable in appearance. The stomach is within normal limits. No acute vascular abnormalities are seen. Diffuse calcification is noted along the abdominal aorta and its branches.  The appendix appears to be normal in caliber, though difficult to fully characterize. There is no evidence of appendicitis. Scattered diverticulosis is noted along the proximal sigmoid colon, without evidence of diverticulitis.  The bladder is moderately distended and grossly unremarkable. The patient is status post hysterectomy. No suspicious adnexal masses are seen. No inguinal lymphadenopathy is seen. There is mild atrophy of the paraspinal musculature.  No acute osseous abnormalities are identified.  IMPRESSION: 1. No acute abnormality seen to explain the patient's symptoms. 2. Mild left renal atrophy noted. 3. Diffuse calcification along the abdominal aorta and its branches. 4. Scattered diverticulosis along the proximal sigmoid colon, without evidence of diverticulitis. 5. Mild atrophy of the paraspinal musculature.   Electronically Signed   By: Garald Balding M.D.   On: 07/11/2014 01:25   Ct Hip Right Wo Contrast  07/11/2014   CLINICAL DATA:  Recent fall with right leg weakness and pain.  EXAM: CT OF THE RIGHT HIP WITHOUT CONTRAST  TECHNIQUE: Images of the right hip were reconstructed with thin cuts from an abdomen/pelvis CT on 07/11/2014 with IV contrast.  COMPARISON:  01/16/2014  FINDINGS: No significant soft tissue swelling in the right groin or proximal right thigh. Images of the right lower abdomen and right pelvis are unremarkable. Atherosclerotic calcifications in right iliac arteries without significant stenosis. Proximal right femoral arteries are patent. Uterus appears to be surgically absent. There is osteopenia in the pelvic bones, particularly involving the sacrum. The right side of sacrum is intact. Normal appearance of the right sacroiliac joint. The right  hip is located without a fracture. Right acetabulum is intact. The right pubic rami are intact.  IMPRESSION: No acute bone abnormality involving the right hip.   Electronically Signed   By: Markus Daft M.D.   On: 07/11/2014 09:59   Dg Hip Unilat With Pelvis 2-3 Views Right  07/08/2014   CLINICAL DATA:  Fall, right pelvic pain near iliac wing.  EXAM: RIGHT HIP (WITH PELVIS) 2-3 VIEWS  COMPARISON:  None.  FINDINGS: No acute bony abnormality. Specifically, no fracture, subluxation, or dislocation. Soft tissues are intact. Early joint space narrowing in the hip joints bilaterally. SI joints are symmetric and unremarkable.  IMPRESSION: No acute bony abnormality.   Electronically Signed   By: Rolm Baptise M.D.   On: 07/08/2014 12:29    Micro Results      Recent Results (from the past 240 hour(s))  Urine culture     Status: None   Collection Time: 07/10/14  9:28 PM  Result Value Ref Range Status   Specimen Description URINE, CATHETERIZED  Final   Special Requests NONE  Final   Culture NO GROWTH 2 DAYS  Final  Report Status 07/12/2014 FINAL  Final       Today   Subjective    [name] today has no headache,no chest abdominal pain,no new weakness tingling or numbness, feels much better.   Objective   Blood pressure 151/65, pulse 58, temperature 98.8 F (37.1 C), temperature source Oral, resp. rate 18, weight 62.778 kg (138 lb 6.4 oz), SpO2 96 %.   Intake/Output Summary (Last 24 hours) at 07/13/14 1055 Last data filed at 07/13/14 1008  Gross per 24 hour  Intake    720 ml  Output    900 ml  Net   -180 ml    Exam Awake , pleasantly confused, No new F.N deficits, Normal affect [place].AT,PERRAL Supple Neck,No JVD, No cervical lymphadenopathy appriciated.  Symmetrical Chest wall movement, Good air movement bilaterally, CTAB RRR,No Gallops,Rubs or new Murmurs, No Parasternal Heave +ve B.Sounds, Abd Soft, Non tender, No organomegaly appriciated, No rebound -guarding or rigidity. No  Cyanosis, Clubbing or edema, No new Rash or bruise   Data Review   CBC w Diff: Lab Results  Component Value Date   WBC 7.6 07/11/2014   HGB 12.6 07/11/2014   HCT 36.0 07/11/2014   PLT 219 07/11/2014   LYMPHOPCT 30 07/01/2014   MONOPCT 10 07/01/2014   EOSPCT 2 07/01/2014   BASOPCT 0 07/01/2014    CMP: Lab Results  Component Value Date   NA 132* 07/11/2014   K 4.0 07/11/2014   CL 100* 07/11/2014   CO2 25 07/11/2014   BUN 10 07/11/2014   CREATININE 1.05* 07/11/2014   PROT 6.2* 07/10/2014   ALBUMIN 3.5 07/10/2014   BILITOT 0.4 07/10/2014   ALKPHOS 56 07/10/2014   AST 24 07/10/2014   ALT 25 07/10/2014  .   Total Time in preparing paper work, data evaluation and todays exam - 35 minutes  Thurnell Lose M.D on 07/13/2014 at 10:55 AM  [facility]   Office  [phone]

## 2014-07-13 NOTE — Progress Notes (Signed)
Daily Progress Note   Patient Name: [name]       Date: 07/13/2014 DOB: 1933/12/17  Age: 81 y.o. MRN#: [id number] Attending Physician: Thurnell Lose, MD Primary Care Physician: [name], NP Admit Date: 07/10/2014  Reason for Consultation/Follow-up: Disposition, Establishing goals of care, Non pain symptom management, Pain control and Psychosocial/spiritual support  Subjective:     Continued conversation with family regarding diagnosis (dementia/failure to thrive), prognosis, natural trajectory of dementia specific to time at EOL.    Focus of care is full comfort, no life prolonging measures   Length of Stay: 3 days  Current Medications: Scheduled Meds:  . acetaminophen  650 mg Oral Once  . clopidogrel  75 mg Oral Daily  . famotidine  20 mg Oral Daily  . fentaNYL  25 mcg Transdermal Q72H  . FLUoxetine  20 mg Oral Daily  . lisinopril  10 mg Oral BID  . metoprolol succinate  25 mg Oral QHS  . sodium chloride  3 mL Intravenous Q12H    Continuous Infusions:    PRN Meds: bisacodyl, LORazepam, morphine CONCENTRATE, ondansetron **OR** ondansetron (ZOFRAN) IV  Palliative Performance Scale: 30 %     Vital Signs: BP 151/65 mmHg  Pulse 58  Temp(Src) 98.8 F (37.1 C) (Oral)  Resp 18  Wt 62.778 kg (138 lb 6.4 oz)  SpO2 96% SpO2: SpO2: 96 % O2 Device: O2 Device: Not Delivered O2 Flow Rate:    Intake/output summary:   Intake/Output Summary (Last 24 hours) at 07/13/14 1341 Last data filed at 07/13/14 1116  Gross per 24 hour  Intake    480 ml  Output   1100 ml  Net   -620 ml   LBM:   Baseline Weight: Weight: 62.778 kg (138 lb 6.4 oz) Most recent weight: Weight: 62.778 kg (138 lb 6.4 oz)  Physical Exam:            General: chronically ill appearing, confused, unable to follow commands HEENT: dry buccal membranes CVS: RRR Resp: decreased in bases Abd: soft NT +BS Skin: warm and dry    Additional Data Reviewed: Recent Labs     07/10/14  1837  07/11/14  0448  WBC  5.5  7.6  HGB  12.6  12.6  PLT  237  219  NA  132*  132*  BUN  11  10  CREATININE  0.96  1.05*     Problem List:  Patient Active Problem List   Diagnosis Date Noted  . Failure to thrive in adult   . Pain   . DNR (do not resuscitate) discussion 07/11/2014  . Palliative care encounter 07/11/2014  . Weakness generalized 07/11/2014  . Progressive neurologic decline 07/10/2014  . Right flank pain 07/10/2014  . Acute encephalopathy 07/10/2014  . Right leg weakness 07/10/2014  . 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 Subdural hematoma 07/01/2014  . CVA (cerebral infarction) 06/29/2014  . Loss of weight 06/29/2014  . Gait disorder 06/16/2014  . Dementia with behavioral disturbance 06/09/2014  . Preventative health care 05/03/2014  . Hearing loss 05/03/2014  . Orthostatic hypotension 04/17/2014  . HLD (hyperlipidemia) 04/12/2014  . Intracranial vascular stenosis 04/12/2014  . Syncope and collapse 04/12/2014  . Weakness   . Vasovagal near syncope 04/06/2014  . Depression 03/31/2014  . GERD (gastroesophageal reflux disease) 03/31/2014  . Pica 02/09/2014  . 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 Malnutrition of moderate degree 02/06/2014  . Obstructive hydrocephalus   . Malnutrition   .  Hypokalemia   . Occlusion of posterior inferior cerebellar artery with infarction   . Cerebellar stroke   . Nausea without vomiting   . Acute CVA (cerebrovascular accident) 01/31/2014  . Essential hypertension 01/31/2014  . Hyperlipemia   . Dementia   . Cerebral hemorrhage, nontraumatic   . History of CVA (cerebrovascular accident)   . Ureterocele, acquired      Palliative Care Assessment & Plan    Code Status:  DNR  Goals of Care   Focus is comfort, quality and dignity.  No life prolonging measures.   3. Symptom Management:   Pain/Dyspnea: Roxanol 5 mg po/sl every 2 hrs prn  Agitation: Ativan  1 mg po/sl every 6  hrs prn  4. Palliative Prophylaxis:   Stool Softner: Dulcolax supp daily prn pr  5. Prognosis: Likley less than 3-4 weeks if she continues with only sips and bites for po intake.  No artifical feeding or hydration, no antibiotic use and no re hospitalizations.  5. Discharge Planning: Hospice facility--family is hopeful that patient is deemed eligible and open to [facility] or [facility], they plan to augment care with private CNAs  Family encouraged to consider a Plan B in event patient is not inpatient hospice eligible.   Care plan was discussed with LCSW  Thank you for allowing the Palliative Medicine Team to assist in the care of this patient.   Time In: 1025 Time Out: 1100 Total Time 35  minutes Prolonged Time Billed  no     Greater than 50%  of this time was spent counseling and coordinating care related to the above assessment and plan.     Knox Royalty, NP  07/13/2014, 1:41 PM  Please contact Palliative Medicine Team phone at [phone] for questions and concerns.

## 2014-07-13 NOTE — Telephone Encounter (Signed)
Unable to reach patient at time of TCM Call. Left message for patient to return call when available.  

## 2014-07-13 NOTE — Clinical Social Work Note (Signed)
Clinical Social Work Assessment  Patient Details  Name: [name] MRN: [id number] Date of Birth: 31-Jan-1933  Date of referral:  07/10/14               Reason for consult:  Facility Placement                Permission sought to share information with:  Other (Patient oriented to self only - daughter Comptroller) and and son decision makers ) Permission granted to share information::  No (Patient only oriented to self - daughter is POA. CSW has talked with [name] and her brother Shanon Brow together.)  Name::        Agency::     Relationship::     Contact Information:     Housing/Transportation Living arrangements for the past 2 months:  Single Family Home Source of Information:  Adult Children Patient Interpreter Needed:  None Criminal Activity/Legal Involvement Pertinent to Current Situation/Hospitalization:  No - Comment as needed Significant Relationships:  Adult Children, Spouse Lives with:  Spouse Do you feel safe going back to the place where you live?  No (Patient needs more care than be provided at home.) Need for family participation in patient care:   Patient actively involved in patient's care  Care giving concerns:  Family has had conversations with MD and hospice staff to determine best course of action for patient care.   Social Worker assessment / plan: On 7/5 CSW participated in meeting with Wadie Lessen with [facility] and family to discuss [place]. Family interested in residential hospice (#[facility] and [facility]) or skilled facility placement. Patient going to [place] also discussed as patient was going to ALF before coming to hospital.  CSW explained facility search process and advised that SNF search would run concurrently with Hospice contacts and family agreeable. Daughter provided with skilled facility list so that family could begin checking into facilities.  On 7/6, family informed that [facility] declined patient as  she does not meet criteria at this time. Family interested in [place] and will visit facility and some others.   Employment status:  Other (Comment) (Unknown if patient worked ) Forensic scientist:  Managed Care PT Recommendations:  Not assessed at this time Information / Referral to community resources:  Other (Comment Required) (Not needed at this time.)  Patient/Family's Response to care:  Family did not express concern regarding patient's care.   Patient/Family's Understanding of and Emotional Response to Diagnosis, Current Treatment, and Prognosis: Family aware of patient's prognosis and desires the most appropriate setting for patient to receive care and Hospice services.  Emotional Assessment Appearance:  Appears stated age Attitude/Demeanor/Rapport:  Unable to Assess (Assessment completed with patient's daughter and son.) Affect (typically observed):  Unable to Assess Orientation:  Oriented to Self Alcohol / Substance use:  Tobacco Use, Alcohol Use, Illicit Drugs (Patient has quit smoking and does not drink or use illicit drugs.) Psych involvement (Current and /or in the community):     Discharge Needs  Concerns to be addressed:  Discharge Planning Concerns (SNF versus residential hospice vs ALF with hospice services) Readmission within the last 30 days:  Yes Current discharge risk:  None Barriers to Discharge:  No Barriers Identified   Sable Feil, LCSW 07/13/2014, 12:25 PM

## 2014-07-13 NOTE — Telephone Encounter (Signed)
Florence Canner from [facility] notified and will inform patient appointment for 07/19/14 1:15pm

## 2014-07-13 NOTE — Telephone Encounter (Signed)
Patient has been discharged from hospital to home.  Could you please arrange TCM visit with me. Let me know if you have trouble finding a spot for patient.

## 2014-07-13 NOTE — Progress Notes (Signed)
Patient Discharge: Disposition: Patient discharged to [facility] facility. Called the CN ( michelle) and gave the report about the patient. IV: Discontinued before discharge.  Telemetry: N/A. Transportation: Patient transported via EMS. Belongings: Patient took all her belongings with her.

## 2014-07-13 NOTE — Discharge Instructions (Signed)
Follow with Primary MD [name], NP in 7 days   Get CBC, CMP, 2 view Chest X ray checked  by Primary MD next visit.    Activity: As tolerated with Full fall precautions use walker/cane & assistance as needed   Disposition SNF with Hospice   Diet: Dysphagia 1 diet with feeding assistance and aspiration precautions.  For Heart failure patients - Check your Weight same time everyday, if you gain over 2 pounds, or you develop in leg swelling, experience more shortness of breath or chest pain, call your Primary MD immediately. Follow Cardiac Low Salt Diet and 1.5 lit/day fluid restriction.   On your next visit with your primary care physician please Get Medicines reviewed and adjusted.   Please request your Prim.MD to go over all Hospital Tests and Procedure/Radiological results at the follow up, please get all Hospital records sent to your Prim MD by signing hospital release before you go home.   If you experience worsening of your admission symptoms, develop shortness of breath, life threatening emergency, suicidal or homicidal thoughts you must seek medical attention immediately by calling 911 or calling your MD immediately  if symptoms less severe.  You Must read complete instructions/literature along with all the possible adverse reactions/side effects for all the Medicines you take and that have been prescribed to you. Take any new Medicines after you have completely understood and accpet all the possible adverse reactions/side effects.   Do not drive, operating heavy machinery, perform activities at heights, swimming or participation in water activities or provide baby sitting services if your were admitted for syncope or siezures until you have seen by Primary MD or a Neurologist and advised to do so again.  Do not drive when taking Pain medications.    Do not take more than prescribed Pain, Sleep and Anxiety Medications  Special Instructions: If you have smoked or  chewed Tobacco  in the last 2 yrs please stop smoking, stop any regular Alcohol  and or any Recreational drug use.  Wear Seat belts while driving.   Please note  You were cared for by a hospitalist during your hospital stay. If you have any questions about your discharge medications or the care you received while you were in the hospital after you are discharged, you can call the unit and asked to speak with the hospitalist on call if the hospitalist that took care of you is not available. Once you are discharged, your primary care physician will handle any further medical issues. Please note that NO REFILLS for any discharge medications will be authorized once you are discharged, as it is imperative that you return to your primary care physician (or establish a relationship with a primary care physician if you do not have one) for your aftercare needs so that they can reassess your need for medications and monitor your lab values.

## 2014-07-13 NOTE — Telephone Encounter (Signed)
Attempted to call patient.  Left message on voicemail to return call.

## 2014-07-13 NOTE — Telephone Encounter (Signed)
Please contact pt to arrange TCM follow up.  I can see her tomorrow at 2:15 or Monday at 1PM please

## 2014-07-13 NOTE — Clinical Social Work Placement (Signed)
   CLINICAL SOCIAL WORK PLACEMENT  NOTE  Date:  07/13/2014  Patient Details  Name: [name] MRN: [id number] Date of Birth: May 24, 1933  Clinical Social Work is seeking post-discharge placement for this patient at the [place] level of care (*CSW will initial, date and re-position this form in  chart as items are completed):  Yes   Patient/family provided with [place] Work Department's list of facilities offering this level of care within the geographic area requested by the patient (or if unable, by the patient's family).  Yes   Patient/family informed of their freedom to choose among providers that offer the needed level of care, that participate in Medicare, Medicaid or managed care program needed by the patient, have an available bed and are willing to accept the patient.  Yes   Patient/family informed of [facility]'s ownership interest in [facility] and [facility], as well as of the fact that they are under no obligation to receive care at these facilities.  PASRR submitted to EDS on 07/12/14     PASRR number received on 07/13/14     Existing PASRR number confirmed on       FL2 transmitted to all facilities in geographic area requested by pt/family on 07/12/14     FL2 transmitted to all facilities within larger geographic area on       Patient informed that his/her managed care company has contracts with or will negotiate with certain facilities, including the following:        Yes   Patient/family informed of bed offers received.  Patient chooses bed at [facility]     Physician recommends and patient chooses bed at      Patient to be transferred to [facility] on 07/13/14.  Patient to be transferred to facility by Ambulance [facility])     Patient family notified on 07/13/14 of transfer.  Name of family member notified:  Daughter, Reche Dixon     PHYSICIAN       Additional Comment:     _______________________________________________ Sable Feil, LCSW 07/13/2014, 2:47 PM

## 2014-07-13 NOTE — Telephone Encounter (Signed)
Notified Tiffany she may use the 1:15 and 1:30pm slots on 07/19/14 with Debbrah Alar, NP. She will notify pt.

## 2014-07-13 NOTE — Telephone Encounter (Signed)
FYI- Spoke with patient's daughter, [name], who states that patient is still in hospital and will be discharged to [facility]. Zacarias Pontes RN confirmed this is the plan.  Follow up appointment scheduled by hospital secretary for 07/19/14.

## 2014-07-13 NOTE — Telephone Encounter (Signed)
Caller name: Florence Canner  Call back number: [phone] ext    Reason for call:  Pt is being discharged from [facility] the doctor would like pt seen in 1 week there is no available time slots please advise any specifics slots I can use if not may I schedule with PA.

## 2014-07-13 NOTE — Telephone Encounter (Signed)
Requested forms be refaxed by [facility]. JG//CMA

## 2014-07-14 ENCOUNTER — Non-Acute Institutional Stay (SKILLED_NURSING_FACILITY): Payer: Medicare Other | Admitting: Adult Health

## 2014-07-14 ENCOUNTER — Encounter: Payer: Self-pay | Admitting: Adult Health

## 2014-07-14 DIAGNOSIS — I63442 Cerebral infarction due to embolism of left cerebellar artery: Secondary | ICD-10-CM | POA: Diagnosis not present

## 2014-07-14 DIAGNOSIS — F329 Major depressive disorder, single episode, unspecified: Secondary | ICD-10-CM | POA: Diagnosis not present

## 2014-07-14 DIAGNOSIS — I10 Essential (primary) hypertension: Secondary | ICD-10-CM | POA: Diagnosis not present

## 2014-07-14 DIAGNOSIS — R1310 Dysphagia, unspecified: Secondary | ICD-10-CM | POA: Diagnosis not present

## 2014-07-14 DIAGNOSIS — K219 Gastro-esophageal reflux disease without esophagitis: Secondary | ICD-10-CM | POA: Diagnosis not present

## 2014-07-14 DIAGNOSIS — F0391 Unspecified dementia with behavioral disturbance: Secondary | ICD-10-CM

## 2014-07-14 DIAGNOSIS — R109 Unspecified abdominal pain: Secondary | ICD-10-CM

## 2014-07-14 DIAGNOSIS — F32A Depression, unspecified: Secondary | ICD-10-CM

## 2014-07-14 DIAGNOSIS — F03918 Unspecified dementia, unspecified severity, with other behavioral disturbance: Secondary | ICD-10-CM

## 2014-07-14 DIAGNOSIS — F419 Anxiety disorder, unspecified: Secondary | ICD-10-CM

## 2014-07-15 ENCOUNTER — Non-Acute Institutional Stay (SKILLED_NURSING_FACILITY): Payer: Medicare Other | Admitting: Internal Medicine

## 2014-07-15 DIAGNOSIS — R627 Adult failure to thrive: Secondary | ICD-10-CM

## 2014-07-15 DIAGNOSIS — R1310 Dysphagia, unspecified: Secondary | ICD-10-CM

## 2014-07-15 DIAGNOSIS — K219 Gastro-esophageal reflux disease without esophagitis: Secondary | ICD-10-CM | POA: Diagnosis not present

## 2014-07-15 DIAGNOSIS — M25551 Pain in right hip: Secondary | ICD-10-CM

## 2014-07-15 DIAGNOSIS — F418 Other specified anxiety disorders: Secondary | ICD-10-CM | POA: Diagnosis not present

## 2014-07-15 DIAGNOSIS — I10 Essential (primary) hypertension: Secondary | ICD-10-CM | POA: Diagnosis not present

## 2014-07-15 DIAGNOSIS — F0390 Unspecified dementia without behavioral disturbance: Secondary | ICD-10-CM

## 2014-07-15 DIAGNOSIS — I63442 Cerebral infarction due to embolism of left cerebellar artery: Secondary | ICD-10-CM | POA: Diagnosis not present

## 2014-07-15 DIAGNOSIS — F03C0 Unspecified dementia, severe, without behavioral disturbance, psychotic disturbance, mood disturbance, and anxiety: Secondary | ICD-10-CM

## 2014-07-15 NOTE — Progress Notes (Signed)
Patient ID: [name], female   DOB: Aug 18, 1933, 81 y.o.   MRN: [id number]     [facility]   PCP: [name], NP  Code Status: DNR  Allergies  Allergen Reactions  . Penicillins     Reaction: unknown  . Adhesive [Tape] Rash    Please use "paper" tape only.  . Latex Rash    Chief Complaint  Patient presents with  . New Admit To SNF     HPI:  81 year old patient is here for hospice care post hospital admission from 07/10/14-07/13/14 failure to thrive and generalized weakness. There was concern for acute stroke initially but no acute findings noted on brain imaging. MRI brain showed amyloid angiopathy with vasogenic edema to left frontal and left temporal lobes, with evolving left small frontal lobe intraparenchymal hematoma. Neurology was consulted and family decided on comfort measures. palliative care team was consulted in the hospital. Hospice team consult has been placed. She has PMH of CVA, dementia, hypertension, hyperlipidemia, frequent falls, DVT, healing loss, GERD with hiatal hernia, osteoporosis with collapsed vertebrae and chronic back pain, anxiety, CKD stage III and chronic lymphocytic leukemia among others. She is seen in her room today with her sister present. She participates minimally in history taking, unable to obtain ROS with her dementia. She has been getting puree diet here and family would like it to be changed to regular diet for comfort measures.   Review of Systems: unable to obtain   Past Medical History  Diagnosis Date  . Hypertension   . Hyperlipemia   . Cerebral hemorrhage, nontraumatic 2011  . Ureterocele, acquired   . Broken arm     left X 2; right X 1; 1 OR" (07/01/2014)  . Brain swelling 2016  . History of pubovaginal sling   . Depression   . Memory loss   . Confusion   . Falls frequently   . Hearing loss   . Hyperlipemia   . DVT (deep venous thrombosis)     "she has one behind her left knee"  (07/01/2014)  . GERD (gastroesophageal reflux disease)   . History of hiatal hernia   . Stroke 2015 & 2016    Dec; Jan, blockage cerebellum; "balance issues; cognition issues since" (07/01/2014)  . CVA (cerebral vascular accident) 2011    left cerebellum  . Osteoporosis   . Collapsed vertebra     "one in her lower and upper back" (07/01/2014)  . Chronic back pain     "w/activity" (07/01/2014)  . Anxiety   . Chronic kidney disease (CKD), stage III (moderate)   . Dementia     "since 2011 CVA; due to micro hemorrhages"   . CLL (chronic lymphocytic leukemia)    Past Surgical History  Procedure Laterality Date  . Cholecystectomy    . Fracture surgery    . Bladder suspension  2006  . Forearm fracture surgery Left 1980's  . Abdominal hysterectomy      "partial"   Social History:   reports that she has quit smoking. Her smoking use included Cigarettes. She has a 40 pack-year smoking history. She has never used smokeless tobacco. She reports that she does not drink alcohol or use illicit drugs.  Family History  Problem Relation Age of Onset  . Stroke Mother   . Diabetes Mother   . Hyperlipidemia Mother   . Hypertension Mother   . Stroke Father   . Heart disease Father   . Hyperlipidemia Father   .  Depression Sister   . Cancer Brother 40    lung    Medications: Patient's Medications  New Prescriptions   No medications on file  Previous Medications   ACETAMINOPHEN (TYLENOL) 500 MG TABLET    Take 1,000 mg by mouth every 6 (six) hours as needed for mild pain.   BISACODYL (DULCOLAX) 10 MG SUPPOSITORY    Place 1 suppository (10 mg total) rectally daily as needed for mild constipation.   CALCIUM CARBONATE-VITAMIN D 600-400 MG-UNIT PER TABLET    Take 1 tablet by mouth 2 (two) times daily.   CLOPIDOGREL (PLAVIX) 75 MG TABLET    Take 1 tablet (75 mg total) by mouth daily.   FENTANYL (DURAGESIC - DOSED MCG/HR) 25 MCG/HR PATCH    Place 1 patch (25 mcg total) onto the skin every 3 (three)  days.   FLUOXETINE (PROZAC) 20 MG TABLET    Take 1 tablet (20 mg total) by mouth daily.   LISINOPRIL (PRINIVIL,ZESTRIL) 10 MG TABLET    Take 1 tablet (10 mg total) by mouth 2 (two) times daily.   LORAZEPAM (ATIVAN) 2 MG/ML CONCENTRATED SOLUTION    Take 0.5 mLs (1 mg total) by mouth every 6 (six) hours as needed for anxiety.   METOPROLOL SUCCINATE (TOPROL-XL) 25 MG 24 HR TABLET    Take 1 tablet (25 mg total) by mouth daily.   MORPHINE SULFATE (MORPHINE CONCENTRATE) 10 MG/0.5ML SOLN CONCENTRATED SOLUTION    Take 0.5 mLs (10 mg total) by mouth every 3 (three) hours as needed for moderate pain or severe pain.   MULTIPLE VITAMIN (MULTIVITAMIN WITH MINERALS) TABS    Take 1 tablet by mouth daily.   ONDANSETRON (ZOFRAN) 4 MG TABLET    Take 1 tablet (4 mg total) by mouth every 6 (six) hours as needed for nausea.   RANITIDINE (ZANTAC) 150 MG TABLET    Take 1 tablet (150 mg total) by mouth at bedtime.   VITAMIN B-12 (CYANOCOBALAMIN) 1000 MCG TABLET    Take 1,000 mcg by mouth 2 (two) times daily.   Modified Medications   No medications on file  Discontinued Medications   No medications on file     Physical Exam: Filed Vitals:   07/15/14 1250  BP: 170/85  Pulse: 83  Temp: 97.1 F (36.2 C)  Resp: 18  SpO2: 96%    General- elderly female, thin built and frail, in no acute distress Head- normocephalic, atraumatic Throat- moist mucus membrane Eyes- PERRLA, EOMI, no pallor, no icterus, no discharge, normal conjunctiva, normal sclera Neck- no cervical lymphadenopathy Cardiovascular- normal s1,s2, no murmurs, palpable dorsalis pedis Respiratory- bilateral clear to auscultation, no wheeze, no rhonchi, no crackles, no use of accessory muscles Abdomen- bowel sounds present, soft, non tender Musculoskeletal- generalized weakness Neurological- alert and oriented to person only Skin- warm and dry Psychiatry- normal mood and affect    Labs reviewed: Basic Metabolic Panel:  Recent Labs   02/06/14 0440  02/07/14 0510  02/09/14 0530  07/02/14 0630 07/10/14 1837 07/11/14 0448  NA 137  < > 144  < > 140  < > 134* 132* 132*  K 3.0*  --  3.0*  < > 3.0*  < > 4.1 3.8 4.0  CL 103  --  112  < > 107  < > 102 98* 100*  CO2 24  --  27  < > 27  < > 26 24 25   GLUCOSE 97  --  101*  < > 116*  < > 97 101*  129*  BUN 6  --  6  < > 6  < > 7 11 10   CREATININE 0.82  --  0.72  < > 0.66  < > 0.90 0.96 1.05*  CALCIUM 8.3*  --  8.4  < > 8.5  < > 8.8* 9.2 9.1  MG 1.7  --  1.6  --  1.7  --   --   --   --   < > = values in this interval not displayed. Liver Function Tests:  Recent Labs  07/01/14 0041 07/02/14 0630 07/10/14 1837  AST 24 25 24   ALT 25 26 25   ALKPHOS 56 57 56  BILITOT 0.6 0.8 0.4  PROT 6.4* 6.4* 6.2*  ALBUMIN 3.8 3.5 3.5    Recent Labs  01/16/14 0900  LIPASE 61*   No results for input(s): AMMONIA in the last 8760 hours. CBC:  Recent Labs  02/10/14 0500 04/06/14 1512  07/01/14 0041 07/02/14 0630 07/10/14 1837 07/11/14 0448  WBC 9.8 5.8  < > 5.6 6.4 5.5 7.6  NEUTROABS 7.2 4.8  --  3.3  --   --   --   HGB 10.6* 11.8*  < > 12.6 12.9 12.6 12.6  HCT 29.8* 34.7*  < > 35.8* 36.5 35.6* 36.0  MCV 90.6 95.6  < > 90.4 90.3 89.9 89.8  PLT 196 237  < > 208 226 237 219  < > = values in this interval not displayed. Cardiac Enzymes:  Recent Labs  04/06/14 1512 07/01/14 0041  TROPONINI <0.03 <0.03   BNP: Invalid input(s): POCBNP CBG:  Recent Labs  04/06/14 1333 07/10/14 1843 07/11/14 0758  GLUCAP 125* 103* 117*    Radiological Exams:  Dg Chest 2 View  07/10/2014   CLINICAL DATA:  Acute onset of altered mental status. Multiple falls. Initial encounter.  EXAM: CHEST  2 VIEW  COMPARISON:  Chest radiograph performed 07/01/2014  FINDINGS: The lungs are well-aerated. Minimal bilateral atelectasis is noted. There is no evidence of pleural effusion or pneumothorax.  The heart is mildly enlarged. No acute osseous abnormalities are seen.  IMPRESSION: Mild  cardiomegaly; minimal bilateral atelectasis seen.   Electronically Signed   By: Garald Balding M.D.   On: 07/10/2014 21:25   Dg Chest 2 View  07/01/2014   CLINICAL DATA:  Patient with altered mental status.  Prior stroke.  EXAM: CHEST  2 VIEW  COMPARISON:  Chest radiograph 04/06/2014  FINDINGS: Stable enlarged cardiac and mediastinal contours. No consolidative pulmonary opacities. No pleural effusion or pneumothorax. Mid thoracic spine degenerative changes.  IMPRESSION: No acute cardiopulmonary process.   Electronically Signed   By: Lovey Newcomer M.D.   On: 07/01/2014 01:22   Dg Ribs Unilateral W/chest Right  07/08/2014   CLINICAL DATA:  [place] Pain in Rigfht ribs( region of lateral midribs near bottom of right breastand right hip Near right iliac wing  EXAM: RIGHT RIBS AND CHEST - 3+ VIEW  COMPARISON:  07/01/2014  FINDINGS: No rib fracture or rib lesion.  Clear lungs.  No pleural effusion or pneumothorax.  Cardiac silhouette is normal in size and configuration. No mediastinal or hilar masses or evidence of adenopathy.  IMPRESSION: 1. No rib fracture or rib lesion. 2. No acute cardiopulmonary disease.   Electronically Signed   By: Lajean Manes M.D.   On: 07/08/2014 12:27   Ct Head Wo Contrast  06/29/2014   CLINICAL DATA:  TIA.  Recurrent symptoms.  EXAM: CT HEAD WITHOUT CONTRAST  TECHNIQUE: Contiguous axial  images were obtained from the base of the skull through the vertex without intravenous contrast.  COMPARISON:  CT head 04/06/2014.  MRI 04/07/2014  FINDINGS: Chronic infarct left inferior cerebellum is unchanged.  New area of hypodensity in the left lateral and posterior temporal lobe. It is difficult determine if this represents vasogenic edema versus interval infarction.  New hypodensity left frontal lobe involving predominately white matter. This could represent vasogenic edema versus subacute infarct. No definite mass lesion identified.  Generalized atrophy. Negative for hydrocephalus. Negative for  intracranial hemorrhage  Calvarium intact  IMPRESSION: Chronic left PICA infarct unchanged  New areas of low-density in the left frontal lobe and left temporal lobe not present on the prior studies. This could represent subacute infarct however vasogenic edema from tumor is in the differential. Follow-up MRI brain with contrast recommended for further evaluation.   Electronically Signed   By: Franchot Gallo M.D.   On: 06/29/2014 08:37   Mr Brain Wo Contrast  07/11/2014   CLINICAL DATA:  Altered mental status, unsteady gait since discharge from hospital last Saturday. RIGHT leg weakness. History of stroke, hypertension, hyperlipidemia.  EXAM: MRI HEAD WITHOUT CONTRAST  TECHNIQUE: Multiplanar, multiecho pulse sequences of the brain and surrounding structures were obtained without intravenous contrast.  COMPARISON:  MRI of the brain July 01, 2014  FINDINGS: No reduced diffusion to suggest acute ischemia. Faint susceptibility artifact in spurious reduced diffusion in LEFT frontal lobe corresponding to sub cm evolving intraparenchymal hematoma. Innumerable punctate small foci of susceptibility artifact throughout the supratentorial and to lesser extent infratentorial brain, present on prior imaging. No midline shift, mass effect or mass lesions. LEFT inferior cerebellar encephalomalacia. Similar non expansile LEFT temporal and LEFT frontal FLAIR T2 hyperintense signal, to lesser extent LEFT mesial occipital lobe. Ventricles and sulci are overall normal for patient's age, persistent mild mass effect on the frontal horn of LEFT lateral ventricle. No midline shift.  No abnormal extra-axial fluid collections. Normal major intracranial vascular flow voids seen at the skull base.  Layering air-fluid levels within the sphenoid sinus. Ocular globes and orbital contents are unremarkable. No abnormal sellar expansion. No cerebellar tonsillar ectopia. Patient is edentulous.  IMPRESSION: No acute intracranial process.  Findings of  amyloid angiopathy (though there may be a component chronic hypertension) as noted on prior examination. Persistent vasogenic edema LEFT frontal and LEFT temporal lobes, with evolving LEFT small frontal lobe intraparenchymal hematoma. Though unlikely gliomatosis cerebri is not excluded, recommend follow-up if symptoms do not improve.  Old LEFT PICA infarct.  Acute sphenoid sinusitis.   Electronically Signed   By: Elon Alas M.D.   On: 07/11/2014 02:34   Mr Brain Wo Contrast  06/30/2014   CLINICAL DATA:  81 year old hypertensive female with history of hyperlipidemia and dementia presenting with increasing confusion over the past 2 weeks and possible stroke. Abnormal CT. Subsequent encounter.  EXAM: MRI HEAD WITHOUT CONTRAST  TECHNIQUE: Multiplanar, multiecho pulse sequences of the brain and surrounding structures were obtained without intravenous contrast.  COMPARISON:  06/28/2014 CT.  04/07/2014 MR.  FINDINGS: Representing a significant change from the relatively recent MR (04/07/2014) is the presence of prominent vasogenic edema within the anterior superior left frontal lobe and the anterior to mid left temporal lobe. Along the periphery are scattered blood breakdown products with focal 9 mm hematoma superior anterior left frontal lobe. Etiology is indeterminate. Mild local mass effect with slight inferior displacement of the left lateral ventricle.  This may reflect changes of amyloid angiography. Cortical vein thrombosis  could cause a similar appearance although the major dural sinuses are patent. Metastatic disease is not entirely excluded. Contrast was not administered. Infection does not appear to be the case clinically and the MR findings are not indicative of such.  Patient would benefit from contrast-enhanced imaging to exclude underlying lesion in addition to followup imaging to help demonstrate that the vasogenic clears as would be expected with a benign process.  No acute thrombotic infarct.   Remote partially hemorrhagic infarct mid to inferior left cerebellum.  Mild small vessel disease type changes.  Global atrophy without hydrocephalus.  Major intracranial vascular structures are patent.  IMPRESSION: Representing a significant change from the relatively recent MR (04/07/2014) is the presence of prominent vasogenic edema within the anterior superior left frontal lobe and the anterior to mid left temporal lobe. Along the periphery are scattered blood breakdown products with focal 9 mm hematoma superior anterior left frontal lobe. Etiology is indeterminate. Mild local mass effect with slight inferior displacement of the left lateral ventricle.  This may reflect changes of amyloid angiography. Cortical vein thrombosis could cause a similar appearance although the major dural sinuses are patent. Metastatic disease is not entirely excluded.  Patient would benefit from contrast-enhanced imaging to exclude underlying lesion in addition to followup imaging to help demonstrate that the vasogenic clears as would be expected with a benign process.  No acute thrombotic infarct.  Remote partially hemorrhagic infarct mid to inferior left cerebellum.  These results were called by telephone at the time of interpretation on 06/30/2014 at 4:24 pm to [facility], Nurse Practitioner, who verbally acknowledged these results.   Electronically Signed   By: Genia Del M.D.   On: 06/30/2014 17:00   Mr Jeri Cos Contrast  07/01/2014   CLINICAL DATA:  Worsening altered mental status for a few weeks, difficulty speaking, confusion and memory issues. History of stroke, hypertension, hyperlipidemia. Followup.  EXAM: MRI HEAD WITHOUT AND WITH CONTRAST  TECHNIQUE: Multiplanar, multiecho pulse sequences of the brain and surrounding structures were obtained without and with intravenous contrast ; limited examination, follow-up to yesterday's noncontrast MRI of the head.  CONTRAST:  37mL MULTIHANCE GADOBENATE DIMEGLUMINE 529  MG/ML IV SOLN  COMPARISON:  MRI of the head June 30, 2014 and MRI head April 07, 2014  FINDINGS: Subcentimeter focus of reduced diffusion within LEFT frontal lobe superior gyrus associated with small amount of susceptibility artifact and T1 shortening. No susceptibility artifact to suggest cortical vein thrombosis. Innumerable punctate supratentorial and cerebellar foci of susceptibility artifact. Faint irregular enhancement associated with the predominately LEFT-sided susceptibility artifact.  No abnormal extra-axial fluid collections or suspicious extra-axial enhancement, no extra-axial mass. Patchy mildly expansile T2 hyperintense signal in the LEFT frontal lobe, LEFT temporal lobe, in addition to subacute to early chronic LEFT posterior inferior cerebellar artery territory infarct.  IMPRESSION: Susceptibility artifact, sub cm LEFT frontal subacute hematoma, multifocal parenchymal edema and faint enhancement, constellation of findings are most consistent with amyloid angiography without suspicious intracranial mass.  Subacute to chronic appearing LEFT posterior inferior cerebellar artery territory infarct.   Electronically Signed   By: Elon Alas M.D.   On: 07/01/2014 05:48   Ct Abdomen Pelvis W Contrast  07/11/2014   CLINICAL DATA:  Status post fall. Right flank pain and right leg weakness. Initial encounter.  EXAM: CT ABDOMEN AND PELVIS WITH CONTRAST  TECHNIQUE: Multidetector CT imaging of the abdomen and pelvis was performed using the standard protocol following bolus administration of intravenous contrast.  CONTRAST:  129mL OMNIPAQUE  IOHEXOL 300 MG/ML  SOLN  COMPARISON:  CT of the abdomen and pelvis from 01/16/2014  FINDINGS: Minimal bibasilar atelectasis is noted.  The liver and spleen are unremarkable in appearance. The gallbladder is within normal limits. The pancreas and adrenal glands are unremarkable.  Mild left renal atrophy is noted. Left-sided perinephric stranding is appreciated. The right  kidney is unremarkable in appearance. There is no evidence of hydronephrosis. No renal or ureteral stones are seen. Mild prominence of the right ureter is nonspecific, without evidence of distal obstructing stone.  No free fluid is identified. The small bowel is unremarkable in appearance. The stomach is within normal limits. No acute vascular abnormalities are seen. Diffuse calcification is noted along the abdominal aorta and its branches.  The appendix appears to be normal in caliber, though difficult to fully characterize. There is no evidence of appendicitis. Scattered diverticulosis is noted along the proximal sigmoid colon, without evidence of diverticulitis.  The bladder is moderately distended and grossly unremarkable. The patient is status post hysterectomy. No suspicious adnexal masses are seen. No inguinal lymphadenopathy is seen. There is mild atrophy of the paraspinal musculature.  No acute osseous abnormalities are identified.  IMPRESSION: 1. No acute abnormality seen to explain the patient's symptoms. 2. Mild left renal atrophy noted. 3. Diffuse calcification along the abdominal aorta and its branches. 4. Scattered diverticulosis along the proximal sigmoid colon, without evidence of diverticulitis. 5. Mild atrophy of the paraspinal musculature.   Electronically Signed   By: Garald Balding M.D.   On: 07/11/2014 01:25   Ct Hip Right Wo Contrast  07/11/2014   CLINICAL DATA:  Recent fall with right leg weakness and pain.  EXAM: CT OF THE RIGHT HIP WITHOUT CONTRAST  TECHNIQUE: Images of the right hip were reconstructed with thin cuts from an abdomen/pelvis CT on 07/11/2014 with IV contrast.  COMPARISON:  01/16/2014  FINDINGS: No significant soft tissue swelling in the right groin or proximal right thigh. Images of the right lower abdomen and right pelvis are unremarkable. Atherosclerotic calcifications in right iliac arteries without significant stenosis. Proximal right femoral arteries are patent.  Uterus appears to be surgically absent. There is osteopenia in the pelvic bones, particularly involving the sacrum. The right side of sacrum is intact. Normal appearance of the right sacroiliac joint. The right hip is located without a fracture. Right acetabulum is intact. The right pubic rami are intact.  IMPRESSION: No acute bone abnormality involving the right hip.   Electronically Signed   By: Markus Daft M.D.   On: 07/11/2014 09:59   Dg Hip Unilat With Pelvis 2-3 Views Right  07/08/2014   CLINICAL DATA:  Fall, right pelvic pain near iliac wing.  EXAM: RIGHT HIP (WITH PELVIS) 2-3 VIEWS  COMPARISON:  None.  FINDINGS: No acute bony abnormality. Specifically, no fracture, subluxation, or dislocation. Soft tissues are intact. Early joint space narrowing in the hip joints bilaterally. SI joints are symmetric and unremarkable.  IMPRESSION: No acute bony abnormality.   Electronically Signed   By: Rolm Baptise M.D.   On: 07/08/2014 12:29    Assessment/Plan  Failure to thrive Pending hospice care consult. To provide assistance with ADLs. Fall precautions. Skin care.  Dysphagia Family does not want her to have puree diet. They had her taco from outside yesterday and she ate it fine per sister. Requests for food to be changed to regular diet for comfort measures, make changes. Aspiration precautions  Advanced dementia likley from vascular component with HTN, CVA hx.  No acute behavioral change, to provide pressure ulcer prophylaxis, assistance with ADLs and fall precautions  Hypertension  Elevated SBP on review. Check bp bid for now and adjsut medication if needed. continue lisinopril 10 mg bid for now  CVA  continue Plavix 75 mg daily  GERD  continue Zantac 150 mg daily  Right hip pain S/P fall. continue fentanyl 25 q3 day and morphine concentrate 5 mg/0.25 mL q8h prn  Depression and anxiety Stable, continue ativan 0.25 ml q8h prn with Prozac 20 mg daily   Goals of care: comfort care,  possible long term   Labs/tests ordered: none  Family/ staff Communication: reviewed care plan with patient and nursing supervisor    Blanchie Serve, MD  [facility] [phone] (Monday-Friday 8 am - 5 pm) [phone] (afterhours)

## 2014-07-15 NOTE — Progress Notes (Signed)
Patient ID: [name], female   DOB: 01-14-1933, 81 y.o.   MRN: [id number]   07/14/14  Facility:  Nursing Home Location:  [facility] Room Number: 102-P LEVEL OF CARE:  SNF (31)    Chief Complaint  Patient presents with  . Hospitalization Follow-up    CVA, dysphagia, hypertension, GERD, right flank pain, depression and anxiety    HISTORY OF PRESENT ILLNESS:  This is a 81 year old female who was been admitted to [facility] on 07/13/14 from [facility]. She has PMH of hypertension, hyperlipidemia, significant hemorrhage, nontraumatic and ureterocele, acquired, brain swelling, depression, healing loss, DVT, GERD, history of hiatal hernia, stroke left cerebellum, osteoporosis, collapsed vertebrae, chronic back pain, anxiety, CKD stage III, dementia and chronic lymphocytic leukemia.. She presented to the ED with right leg weakness and right flank pain. It was reported that she had been progressively declining especially with mobility. MRI shows a minimally angiopathy. No acute stroke. Persistent vasogenic edema left frontal and left temporal lobes, with evolving left small frontal lobe intraparenchymal hematoma. Neurology was consulted and family desires no interventions. She will be admitted for hospice care.   PAST MEDICAL HISTORY:  Past Medical History  Diagnosis Date  . Hypertension   . Hyperlipemia   . Cerebral hemorrhage, nontraumatic 2011  . Ureterocele, acquired   . Broken arm     left X 2; right X 1; 1 OR" (07/01/2014)  . Brain swelling 2016  . History of pubovaginal sling   . Depression   . Memory loss   . Confusion   . Falls frequently   . Hearing loss   . Hyperlipemia   . DVT (deep venous thrombosis)     "she has one behind her left knee" (07/01/2014)  . GERD (gastroesophageal reflux disease)   . History of hiatal hernia   . Stroke 2015 & 2016    Dec; Jan, blockage cerebellum; "balance issues; cognition issues since" (07/01/2014)   . CVA (cerebral vascular accident) 2011    left cerebellum  . Osteoporosis   . Collapsed vertebra     "one in her lower and upper back" (07/01/2014)  . Chronic back pain     "w/activity" (07/01/2014)  . Anxiety   . Chronic kidney disease (CKD), stage III (moderate)   . Dementia     "since 2011 CVA; due to micro hemorrhages"   . CLL (chronic lymphocytic leukemia)     CURRENT MEDICATIONS: Reviewed per MAR/see medication list  Allergies  Allergen Reactions  . Penicillins     Reaction: unknown  . Adhesive [Tape] Rash    Please use "paper" tape only.  . Latex Rash     REVIEW OF SYSTEMS:  Unable to obtain due advanced dementia  PHYSICAL EXAMINATION  GENERAL: no acute distress, normal body habit tous EYES: conjunctivae normal, sclerae normal, normal eye lids NECK: supple, trachea midline, no neck masses, no thyroid tenderness, no thyromegaly LYMPHATICS: no LAN in the neck, no supraclavicular LAN RESPIRATORY: breathing is even & unlabored, BS CTAB CARDIAC: RRR, no murmur,no extra heart sounds, no edema GI: abdomen soft, normal BS, no masses, no tenderness, no hepatomegaly, no splenomegaly EXTREMITIES:  Able to move 4 extremities PSYCHIATRIC: the patient is alert & oriented to person, affect & behavior appropriate  LABS/RADIOLOGY: Labs reviewed: Basic Metabolic Panel:  Recent Labs  02/06/14 0440  02/07/14 0510  02/09/14 0530  07/02/14 0630 07/10/14 1837 07/11/14 0448  NA 137  < > 144  < > 140  < >  134* 132* 132*  K 3.0*  --  3.0*  < > 3.0*  < > 4.1 3.8 4.0  CL 103  --  112  < > 107  < > 102 98* 100*  CO2 24  --  27  < > 27  < > 26 24 25   GLUCOSE 97  --  101*  < > 116*  < > 97 101* 129*  BUN 6  --  6  < > 6  < > 7 11 10   CREATININE 0.82  --  0.72  < > 0.66  < > 0.90 0.96 1.05*  CALCIUM 8.3*  --  8.4  < > 8.5  < > 8.8* 9.2 9.1  MG 1.7  --  1.6  --  1.7  --   --   --   --   < > = values in this interval not displayed. Liver Function Tests:  Recent Labs   07/01/14 0041 07/02/14 0630 07/10/14 1837  AST 24 25 24   ALT 25 26 25   ALKPHOS 56 57 56  BILITOT 0.6 0.8 0.4  PROT 6.4* 6.4* 6.2*  ALBUMIN 3.8 3.5 3.5    Recent Labs  01/16/14 0900  LIPASE 61*   CBC:  Recent Labs  02/10/14 0500 04/06/14 1512  07/01/14 0041 07/02/14 0630 07/10/14 1837 07/11/14 0448  WBC 9.8 5.8  < > 5.6 6.4 5.5 7.6  NEUTROABS 7.2 4.8  --  3.3  --   --   --   HGB 10.6* 11.8*  < > 12.6 12.9 12.6 12.6  HCT 29.8* 34.7*  < > 35.8* 36.5 35.6* 36.0  MCV 90.6 95.6  < > 90.4 90.3 89.9 89.8  PLT 196 237  < > 208 226 237 219  < > = values in this interval not displayed.  Lipid Panel:  Recent Labs  02/01/14 0310  HDL 46   Cardiac Enzymes:  Recent Labs  04/06/14 1512 07/01/14 0041  TROPONINI <0.03 <0.03   CBG:  Recent Labs  04/06/14 1333 07/10/14 1843 07/11/14 0758  GLUCAP 125* 103* 117*     Dg Chest 2 View  07/10/2014   CLINICAL DATA:  Acute onset of altered mental status. Multiple falls. Initial encounter.  EXAM: CHEST  2 VIEW  COMPARISON:  Chest radiograph performed 07/01/2014  FINDINGS: The lungs are well-aerated. Minimal bilateral atelectasis is noted. There is no evidence of pleural effusion or pneumothorax.  The heart is mildly enlarged. No acute osseous abnormalities are seen.  IMPRESSION: Mild cardiomegaly; minimal bilateral atelectasis seen.   Electronically Signed   By: Garald Balding M.D.   On: 07/10/2014 21:25   Dg Chest 2 View  07/01/2014   CLINICAL DATA:  Patient with altered mental status.  Prior stroke.  EXAM: CHEST  2 VIEW  COMPARISON:  Chest radiograph 04/06/2014  FINDINGS: Stable enlarged cardiac and mediastinal contours. No consolidative pulmonary opacities. No pleural effusion or pneumothorax. Mid thoracic spine degenerative changes.  IMPRESSION: No acute cardiopulmonary process.   Electronically Signed   By: Lovey Newcomer M.D.   On: 07/01/2014 01:22   Dg Ribs Unilateral W/chest Right  07/08/2014   CLINICAL DATA:  [place] Pain in  Rigfht ribs( region of lateral midribs near bottom of right breastand right hip Near right iliac wing  EXAM: RIGHT RIBS AND CHEST - 3+ VIEW  COMPARISON:  07/01/2014  FINDINGS: No rib fracture or rib lesion.  Clear lungs.  No pleural effusion or pneumothorax.  Cardiac silhouette is normal in  size and configuration. No mediastinal or hilar masses or evidence of adenopathy.  IMPRESSION: 1. No rib fracture or rib lesion. 2. No acute cardiopulmonary disease.   Electronically Signed   By: Lajean Manes M.D.   On: 07/08/2014 12:27   Ct Head Wo Contrast  06/29/2014   CLINICAL DATA:  TIA.  Recurrent symptoms.  EXAM: CT HEAD WITHOUT CONTRAST  TECHNIQUE: Contiguous axial images were obtained from the base of the skull through the vertex without intravenous contrast.  COMPARISON:  CT head 04/06/2014.  MRI 04/07/2014  FINDINGS: Chronic infarct left inferior cerebellum is unchanged.  New area of hypodensity in the left lateral and posterior temporal lobe. It is difficult determine if this represents vasogenic edema versus interval infarction.  New hypodensity left frontal lobe involving predominately white matter. This could represent vasogenic edema versus subacute infarct. No definite mass lesion identified.  Generalized atrophy. Negative for hydrocephalus. Negative for intracranial hemorrhage  Calvarium intact  IMPRESSION: Chronic left PICA infarct unchanged  New areas of low-density in the left frontal lobe and left temporal lobe not present on the prior studies. This could represent subacute infarct however vasogenic edema from tumor is in the differential. Follow-up MRI brain with contrast recommended for further evaluation.   Electronically Signed   By: Franchot Gallo M.D.   On: 06/29/2014 08:37   Mr Brain Wo Contrast  07/11/2014   CLINICAL DATA:  Altered mental status, unsteady gait since discharge from hospital last Saturday. RIGHT leg weakness. History of stroke, hypertension, hyperlipidemia.  EXAM: MRI HEAD  WITHOUT CONTRAST  TECHNIQUE: Multiplanar, multiecho pulse sequences of the brain and surrounding structures were obtained without intravenous contrast.  COMPARISON:  MRI of the brain July 01, 2014  FINDINGS: No reduced diffusion to suggest acute ischemia. Faint susceptibility artifact in spurious reduced diffusion in LEFT frontal lobe corresponding to sub cm evolving intraparenchymal hematoma. Innumerable punctate small foci of susceptibility artifact throughout the supratentorial and to lesser extent infratentorial brain, present on prior imaging. No midline shift, mass effect or mass lesions. LEFT inferior cerebellar encephalomalacia. Similar non expansile LEFT temporal and LEFT frontal FLAIR T2 hyperintense signal, to lesser extent LEFT mesial occipital lobe. Ventricles and sulci are overall normal for patient's age, persistent mild mass effect on the frontal horn of LEFT lateral ventricle. No midline shift.  No abnormal extra-axial fluid collections. Normal major intracranial vascular flow voids seen at the skull base.  Layering air-fluid levels within the sphenoid sinus. Ocular globes and orbital contents are unremarkable. No abnormal sellar expansion. No cerebellar tonsillar ectopia. Patient is edentulous.  IMPRESSION: No acute intracranial process.  Findings of amyloid angiopathy (though there may be a component chronic hypertension) as noted on prior examination. Persistent vasogenic edema LEFT frontal and LEFT temporal lobes, with evolving LEFT small frontal lobe intraparenchymal hematoma. Though unlikely gliomatosis cerebri is not excluded, recommend follow-up if symptoms do not improve.  Old LEFT PICA infarct.  Acute sphenoid sinusitis.   Electronically Signed   By: Elon Alas M.D.   On: 07/11/2014 02:34   Mr Brain Wo Contrast  06/30/2014   CLINICAL DATA:  81 year old hypertensive female with history of hyperlipidemia and dementia presenting with increasing confusion over the past 2 weeks and  possible stroke. Abnormal CT. Subsequent encounter.  EXAM: MRI HEAD WITHOUT CONTRAST  TECHNIQUE: Multiplanar, multiecho pulse sequences of the brain and surrounding structures were obtained without intravenous contrast.  COMPARISON:  06/28/2014 CT.  04/07/2014 MR.  FINDINGS: Representing a significant change from the relatively recent  MR (04/07/2014) is the presence of prominent vasogenic edema within the anterior superior left frontal lobe and the anterior to mid left temporal lobe. Along the periphery are scattered blood breakdown products with focal 9 mm hematoma superior anterior left frontal lobe. Etiology is indeterminate. Mild local mass effect with slight inferior displacement of the left lateral ventricle.  This may reflect changes of amyloid angiography. Cortical vein thrombosis could cause a similar appearance although the major dural sinuses are patent. Metastatic disease is not entirely excluded. Contrast was not administered. Infection does not appear to be the case clinically and the MR findings are not indicative of such.  Patient would benefit from contrast-enhanced imaging to exclude underlying lesion in addition to followup imaging to help demonstrate that the vasogenic clears as would be expected with a benign process.  No acute thrombotic infarct.  Remote partially hemorrhagic infarct mid to inferior left cerebellum.  Mild small vessel disease type changes.  Global atrophy without hydrocephalus.  Major intracranial vascular structures are patent.  IMPRESSION: Representing a significant change from the relatively recent MR (04/07/2014) is the presence of prominent vasogenic edema within the anterior superior left frontal lobe and the anterior to mid left temporal lobe. Along the periphery are scattered blood breakdown products with focal 9 mm hematoma superior anterior left frontal lobe. Etiology is indeterminate. Mild local mass effect with slight inferior displacement of the left lateral  ventricle.  This may reflect changes of amyloid angiography. Cortical vein thrombosis could cause a similar appearance although the major dural sinuses are patent. Metastatic disease is not entirely excluded.  Patient would benefit from contrast-enhanced imaging to exclude underlying lesion in addition to followup imaging to help demonstrate that the vasogenic clears as would be expected with a benign process.  No acute thrombotic infarct.  Remote partially hemorrhagic infarct mid to inferior left cerebellum.  These results were called by telephone at the time of interpretation on 06/30/2014 at 4:24 pm to [facility], Nurse Practitioner, who verbally acknowledged these results.   Electronically Signed   By: Genia Del M.D.   On: 06/30/2014 17:00   Mr Jeri Cos Contrast  07/01/2014   CLINICAL DATA:  Worsening altered mental status for a few weeks, difficulty speaking, confusion and memory issues. History of stroke, hypertension, hyperlipidemia. Followup.  EXAM: MRI HEAD WITHOUT AND WITH CONTRAST  TECHNIQUE: Multiplanar, multiecho pulse sequences of the brain and surrounding structures were obtained without and with intravenous contrast ; limited examination, follow-up to yesterday's noncontrast MRI of the head.  CONTRAST:  32mL MULTIHANCE GADOBENATE DIMEGLUMINE 529 MG/ML IV SOLN  COMPARISON:  MRI of the head June 30, 2014 and MRI head April 07, 2014  FINDINGS: Subcentimeter focus of reduced diffusion within LEFT frontal lobe superior gyrus associated with small amount of susceptibility artifact and T1 shortening. No susceptibility artifact to suggest cortical vein thrombosis. Innumerable punctate supratentorial and cerebellar foci of susceptibility artifact. Faint irregular enhancement associated with the predominately LEFT-sided susceptibility artifact.  No abnormal extra-axial fluid collections or suspicious extra-axial enhancement, no extra-axial mass. Patchy mildly expansile T2 hyperintense signal in the  LEFT frontal lobe, LEFT temporal lobe, in addition to subacute to early chronic LEFT posterior inferior cerebellar artery territory infarct.  IMPRESSION: Susceptibility artifact, sub cm LEFT frontal subacute hematoma, multifocal parenchymal edema and faint enhancement, constellation of findings are most consistent with amyloid angiography without suspicious intracranial mass.  Subacute to chronic appearing LEFT posterior inferior cerebellar artery territory infarct.   Electronically Signed   By: Sandie Ano  Bloomer M.D.   On: 07/01/2014 05:48   Ct Abdomen Pelvis W Contrast  07/11/2014   CLINICAL DATA:  Status post fall. Right flank pain and right leg weakness. Initial encounter.  EXAM: CT ABDOMEN AND PELVIS WITH CONTRAST  TECHNIQUE: Multidetector CT imaging of the abdomen and pelvis was performed using the standard protocol following bolus administration of intravenous contrast.  CONTRAST:  176mL OMNIPAQUE IOHEXOL 300 MG/ML  SOLN  COMPARISON:  CT of the abdomen and pelvis from 01/16/2014  FINDINGS: Minimal bibasilar atelectasis is noted.  The liver and spleen are unremarkable in appearance. The gallbladder is within normal limits. The pancreas and adrenal glands are unremarkable.  Mild left renal atrophy is noted. Left-sided perinephric stranding is appreciated. The right kidney is unremarkable in appearance. There is no evidence of hydronephrosis. No renal or ureteral stones are seen. Mild prominence of the right ureter is nonspecific, without evidence of distal obstructing stone.  No free fluid is identified. The small bowel is unremarkable in appearance. The stomach is within normal limits. No acute vascular abnormalities are seen. Diffuse calcification is noted along the abdominal aorta and its branches.  The appendix appears to be normal in caliber, though difficult to fully characterize. There is no evidence of appendicitis. Scattered diverticulosis is noted along the proximal sigmoid colon, without evidence  of diverticulitis.  The bladder is moderately distended and grossly unremarkable. The patient is status post hysterectomy. No suspicious adnexal masses are seen. No inguinal lymphadenopathy is seen. There is mild atrophy of the paraspinal musculature.  No acute osseous abnormalities are identified.  IMPRESSION: 1. No acute abnormality seen to explain the patient's symptoms. 2. Mild left renal atrophy noted. 3. Diffuse calcification along the abdominal aorta and its branches. 4. Scattered diverticulosis along the proximal sigmoid colon, without evidence of diverticulitis. 5. Mild atrophy of the paraspinal musculature.   Electronically Signed   By: Garald Balding M.D.   On: 07/11/2014 01:25   Ct Hip Right Wo Contrast  07/11/2014   CLINICAL DATA:  Recent fall with right leg weakness and pain.  EXAM: CT OF THE RIGHT HIP WITHOUT CONTRAST  TECHNIQUE: Images of the right hip were reconstructed with thin cuts from an abdomen/pelvis CT on 07/11/2014 with IV contrast.  COMPARISON:  01/16/2014  FINDINGS: No significant soft tissue swelling in the right groin or proximal right thigh. Images of the right lower abdomen and right pelvis are unremarkable. Atherosclerotic calcifications in right iliac arteries without significant stenosis. Proximal right femoral arteries are patent. Uterus appears to be surgically absent. There is osteopenia in the pelvic bones, particularly involving the sacrum. The right side of sacrum is intact. Normal appearance of the right sacroiliac joint. The right hip is located without a fracture. Right acetabulum is intact. The right pubic rami are intact.  IMPRESSION: No acute bone abnormality involving the right hip.   Electronically Signed   By: Markus Daft M.D.   On: 07/11/2014 09:59   Dg Hip Unilat With Pelvis 2-3 Views Right  07/08/2014   CLINICAL DATA:  Fall, right pelvic pain near iliac wing.  EXAM: RIGHT HIP (WITH PELVIS) 2-3 VIEWS  COMPARISON:  None.  FINDINGS: No acute bony abnormality.  Specifically, no fracture, subluxation, or dislocation. Soft tissues are intact. Early joint space narrowing in the hip joints bilaterally. SI joints are symmetric and unremarkable.  IMPRESSION: No acute bony abnormality.   Electronically Signed   By: Rolm Baptise M.D.   On: 07/08/2014 12:29    ASSESSMENT/PLAN:  CVA - for hospice care,: 40 measures; continue Plavix 75 mg by mouth daily  Dysphagia - for speech therapy; aspiration precautions; dysphagia 1 diet (Puree)  Hypertension - continue lisinopril 10 mg 1 tab by mouth twice a day  GERD - continue Zantac 150 mg 1 tab by mouth daily at bedtime  Right flank pain -  S/P fall; continue fentanyl 25 g/hour 1 patch transdermally every 3 days and decrease morphine concentrate 5 mg/0.25 mL by mouth every 8 hours as needed  Depression - mood is stable; continue Prozac 20 mg 1 tab by mouth daily  Anxiety - decrease Ativan to 0.5 mg/0.25 mL by mouth every 8 hours when necessary   Goals of care:  Short-term rehabilitation   Labs/test ordered:  CBC, CMP, UA CS and chest x-ray  Spent 50 minutes in patient care.    [facility], NP [employer] [phone]

## 2014-07-15 NOTE — Telephone Encounter (Signed)
Pt has been admitted to [facility].  Called daughter to confirm appointment scheduled for 07/19/14.

## 2014-07-19 ENCOUNTER — Telehealth: Payer: Self-pay | Admitting: *Deleted

## 2014-07-19 ENCOUNTER — Ambulatory Visit: Payer: Medicare Other | Admitting: Family

## 2014-07-19 NOTE — Telephone Encounter (Signed)
Marj-- Pt did not come in for hospital follow up at 1:15pm. Please do not charge no show fee as pt was supposed to have been discharged to a skilled nursing home.. Thanks!

## 2014-07-20 ENCOUNTER — Telehealth: Payer: Self-pay | Admitting: Family

## 2014-07-20 NOTE — Telephone Encounter (Signed)
Noted  

## 2014-07-20 NOTE — Telephone Encounter (Signed)
Caller name: Karna Christmas Relation to pt: Call back number: [phone] Pharmacy:  Reason for call:   Terri states that patient mom is now at [facility] and will be seeing a physician there from now on

## 2014-07-25 ENCOUNTER — Ambulatory Visit: Payer: Self-pay | Admitting: Neurology

## 2014-08-08 ENCOUNTER — Ambulatory Visit: Payer: Medicare Other | Admitting: Family

## 2014-08-15 ENCOUNTER — Non-Acute Institutional Stay (SKILLED_NURSING_FACILITY): Payer: Medicare Other | Admitting: Adult Health

## 2014-08-15 ENCOUNTER — Encounter: Payer: Self-pay | Admitting: Adult Health

## 2014-08-15 DIAGNOSIS — N390 Urinary tract infection, site not specified: Secondary | ICD-10-CM | POA: Diagnosis not present

## 2014-08-15 DIAGNOSIS — M25551 Pain in right hip: Secondary | ICD-10-CM

## 2014-08-15 DIAGNOSIS — R627 Adult failure to thrive: Secondary | ICD-10-CM

## 2014-08-15 DIAGNOSIS — I63442 Cerebral infarction due to embolism of left cerebellar artery: Secondary | ICD-10-CM

## 2014-08-15 DIAGNOSIS — F329 Major depressive disorder, single episode, unspecified: Secondary | ICD-10-CM | POA: Diagnosis not present

## 2014-08-15 DIAGNOSIS — I952 Hypotension due to drugs: Secondary | ICD-10-CM

## 2014-08-15 DIAGNOSIS — K219 Gastro-esophageal reflux disease without esophagitis: Secondary | ICD-10-CM | POA: Diagnosis not present

## 2014-08-15 DIAGNOSIS — F0390 Unspecified dementia without behavioral disturbance: Secondary | ICD-10-CM | POA: Diagnosis not present

## 2014-08-15 DIAGNOSIS — F03C0 Unspecified dementia, severe, without behavioral disturbance, psychotic disturbance, mood disturbance, and anxiety: Secondary | ICD-10-CM

## 2014-08-15 DIAGNOSIS — I10 Essential (primary) hypertension: Secondary | ICD-10-CM

## 2014-08-15 DIAGNOSIS — R1310 Dysphagia, unspecified: Secondary | ICD-10-CM

## 2014-08-15 DIAGNOSIS — F419 Anxiety disorder, unspecified: Secondary | ICD-10-CM

## 2014-08-15 DIAGNOSIS — F32A Depression, unspecified: Secondary | ICD-10-CM

## 2014-08-15 NOTE — Progress Notes (Signed)
Patient ID: [name], female   DOB: [birth year], 81 y.o.   MRN: [id number]   08/15/14  Facility:  [facility]urs[facility] Home Location:  [place] Room Number: 102-P LEVEL OF CARE:  SNF (31)    Chief Complaint  Patient presents with  . Medical Management of Chronic Issues    Failure to thrive, CVA, Hysphagia, Hypertension, GERD, Right hip pain, Depression, Anxiety, Dementa and hypotension    HISTORY OF PRESENT ILLNESS:  This is a 81 year old female who is being seen for a routine visit. Reviewed BP/HR -  94/44, 61; 108/52, 56 ; 130/62, 54; 130/55, 59; 140/76, 60 and 129/53, 53. She currently takes Toprol XL and Lisinopril. No complaints of dizziness. She was seen sitting on her chair in her room. She is verbally responsive and pleasant. Urine culture shows >=100,000 colonies/ml citrobacter freundii. No hematuria nor fever.  She was been admitted to [facility] on 07/13/14 from [facility]. She has PMH of hypertension, hyperlipidemia, significant hemorrhage, nontraumatic and ureterocele, acquired, brain swelling, depression, healing loss, DVT, GERD, history of hiatal hernia, stroke left cerebellum, osteoporosis, collapsed vertebrae, chronic back pain, anxiety, CKD stage III, dementia and chronic lymphocytic leukemia.. She presented to the ED with right leg weakness and right flank pain. It was reported that she had been progressively declining especially with mobility. MRI shows a minimally angiopathy. No acute stroke. Persistent vasogenic edema left frontal and left temporal lobes, with evolving left small frontal lobe intraparenchymal hematoma. Neurology was consulted and family desires no interventions.   She is on comfort care/hospice.  PAST MEDICAL HISTORY:  Past Medical History  Diagnosis Date  . Hypertension   . Hyperlipemia   . Cerebral hemorrhage, nontraumatic 2011  . Ureterocele, acquired   . Broken arm     left X 2; right X 1; 1 OR" (07/01/2014)  .  Brain swelling 2016  . History of pubovaginal sling   . Depression   . Memory loss   . Confusion   . Falls frequently   . Hearing loss   . Hyperlipemia   . DVT (deep venous thrombosis)     "she has one behind her left knee" (07/01/2014)  . GERD (gastroesophageal reflux disease)   . History of hiatal hernia   . Stroke 2015 & 2016    Dec; Jan, blockage cerebellum; "balance issues; cognition issues since" (07/01/2014)  . CVA (cerebral vascular accident) 2011    left cerebellum  . Osteoporosis   . Collapsed vertebra     "one in her lower and upper back" (07/01/2014)  . Chronic back pain     "w/activity" (07/01/2014)  . Anxiety   . Chronic kidney disease (CKD), stage III (moderate)   . Dementia     "since 2011 CVA; due to micro hemorrhages"   . CLL (chronic lymphocytic leukemia)     CURRENT MEDICATIONS: Reviewed per MAR/see medication list    Medication List       This list is accurate as of: 08/15/14  7:44 PM.  Always use your most recent med list.               acetaminophen 500 MG tablet  Commonly known as:  TYLENOL  Take 1,000 mg by mouth every 6 (six) hours as needed for mild pain.     bisacodyl 10 MG suppository  Commonly known as:  DULCOLAX  Place 1 suppository (10 mg total) rectally daily as needed for mild constipation.     clopidogrel  75 MG tablet  Commonly known as:  PLAVIX  Take 1 tablet (75 mg total) by mouth daily.     fentaNYL 25 MCG/HR patch  Commonly known as:  DURAGESIC - dosed mcg/hr  Place 1 patch (25 mcg total) onto the skin every 3 (three) days.     FLUoxetine 20 MG tablet  Commonly known as:  PROZAC  Take 1 tablet (20 mg total) by mouth daily.     lisinopril 10 MG tablet  Commonly known as:  PRINIVIL,ZESTRIL  Take 1 tablet (10 mg total) by mouth 2 (two) times daily.     LORazepam 2 MG/ML concentrated solution  Commonly known as:  ATIVAN  Take 0.5 mLs (1 mg total) by mouth every 6 (six) hours as needed for anxiety.     metoprolol tartrate  25 MG tablet  Commonly known as:  LOPRESSOR  Take 12.5 mg by mouth daily.     morphine CONCENTRATE 10 MG/0.5ML Soln concentrated solution  Take 0.5 mLs (10 mg total) by mouth every 3 (three) hours as needed for moderate pain or severe pain.     ondansetron 4 MG tablet  Commonly known as:  ZOFRAN  Take 1 tablet (4 mg total) by mouth every 6 (six) hours as needed for nausea.     ranitidine 150 MG tablet  Commonly known as:  ZANTAC  Take 1 tablet (150 mg total) by mouth at bedtime.         Allergies  Allergen Reactions  . Penicillins     Reaction: unknown  . Adhesive [Tape] Rash    Please use "paper" tape only.  . Latex Rash     REVIEW OF SYSTEMS:  Unable to obtain due advanced dementia  PHYSICAL EXAMINATION  GENERAL: no acute distress, normal body habit tous NECK: supple, trachea midline, no neck masses, no thyroid tenderness, no thyromegaly LYMPHATICS: no LAN in the neck, no supraclavicular LAN RESPIRATORY: breathing is even & unlabored, BS CTAB CARDIAC: RRR, no murmur,no extra heart sounds, no edema GI: abdomen soft, normal BS, no masses, no tenderness, no hepatomegaly, no splenomegaly EXTREMITIES:  Able to move 4 extremities PSYCHIATRIC: the patient is alert & oriented to person, affect & behavior appropriate  LABS/RADIOLOGY: Labs reviewed: 08/13/14  Urine culture shows >=100,000 colonies/ml citrobacter freundii 07/18/14  Wbc 8.3  hgb 12.7  hct 38.3  mcv 96  Platelet 290  NA 139  K 3.8  Glucose 93  BUN 16  Creatinine 1.10 CA 9.1 Basic Metabolic Panel:  Recent Labs  02/06/14 0440  02/07/14 0510  02/09/14 0530  07/02/14 0630 07/10/14 1837 07/11/14 0448  NA 137  < > 144  < > 140  < > 134* 132* 132*  K 3.0*  --  3.0*  < > 3.0*  < > 4.1 3.8 4.0  CL 103  --  112  < > 107  < > 102 98* 100*  CO2 24  --  27  < > 27  < > 26 24 25   GLUCOSE 97  --  101*  < > 116*  < > 97 101* 129*  BUN 6  --  6  < > 6  < > 7 11 10   CREATININE 0.82  --  0.72  < > 0.66  < > 0.90 0.96  1.05*  CALCIUM 8.3*  --  8.4  < > 8.5  < > 8.8* 9.2 9.1  MG 1.7  --  1.6  --  1.7  --   --   --   --   < > =  values in this interval not displayed. Liver Function Tests:  Recent Labs  07/01/14 0041 07/02/14 0630 07/10/14 1837  AST 24 25 24   ALT 25 26 25   ALKPHOS 56 57 56  BILITOT 0.6 0.8 0.4  PROT 6.4* 6.4* 6.2*  ALBUMIN 3.8 3.5 3.5    Recent Labs  01/16/14 0900  LIPASE 61*   CBC:  Recent Labs  02/10/14 0500 04/06/14 1512  07/01/14 0041 07/02/14 0630 07/10/14 1837 07/11/14 0448  WBC 9.8 5.8  < > 5.6 6.4 5.5 7.6  NEUTROABS 7.2 4.8  --  3.3  --   --   --   HGB 10.6* 11.8*  < > 12.6 12.9 12.6 12.6  HCT 29.8* 34.7*  < > 35.8* 36.5 35.6* 36.0  MCV 90.6 95.6  < > 90.4 90.3 89.9 89.8  PLT 196 237  < > 208 226 237 219  < > = values in this interval not displayed.  Lipid Panel:  Recent Labs  02/01/14 0310  HDL 46   Cardiac Enzymes:  Recent Labs  04/06/14 1512 07/01/14 0041  TROPONINI <0.03 <0.03   CBG:  Recent Labs  04/06/14 1333 07/10/14 1843 07/11/14 0758  GLUCAP 125* 103* 117*     No results found.  ASSESSMENT/PLAN:  Failure to thrive - continue supportive care  CVA - for hospice care,: comfort  measures; continue Plavix 75 mg by mouth daily  Dysphagia - for speech therapy; aspiration precautions; dysphagia 1 diet (Puree)  Hypertension - continue lisinopril 10 mg 1 tab by mouth twice a day  GERD - continue Zantac 150 mg 1 tab by mouth daily at bedtime  Right hip pain -  continue fentanyl 25 g/hour 1 patch transdermally every 3 days and decrease morphine concentrate 5 mg/0.25 mL by mouth every 8 hours as needed  Depression - mood is stable; continue Prozac 20 mg 1 tab by mouth daily  Anxiety - decrease Ativan to 0.5 mg/0.25 mL by mouth every 8 hours when necessary  Dementia - advanced  UTI -  Start Bactrim DS 1 tab PO BID X 7 days and Florastor 250 mg 1 capsule PO BID X 10 days  Hypotension - discontinue Toprol XL; start Lopressor  25 mg take 1/2 tab = 12.5 mg PO daily     Goals of care:  Long-term care/Hospice care     [facility], [facility] [phone]

## 2014-08-16 ENCOUNTER — Ambulatory Visit: Payer: Medicare Other | Admitting: Neurology

## 2014-09-15 ENCOUNTER — Ambulatory Visit: Payer: Medicare Other | Admitting: Neurology

## 2014-09-21 ENCOUNTER — Other Ambulatory Visit: Payer: Self-pay | Admitting: *Deleted

## 2014-09-21 MED ORDER — FENTANYL 25 MCG/HR TD PT72: 25.0000 ug | MEDICATED_PATCH | TRANSDERMAL | Status: AC

## 2014-09-21 MED ORDER — FENTANYL 25 MCG/HR TD PT72: 25.0000 ug | MEDICATED_PATCH | TRANSDERMAL | Status: DC

## 2014-09-21 NOTE — Telephone Encounter (Signed)
[facility] 

## 2014-10-25 ENCOUNTER — Other Ambulatory Visit: Payer: Self-pay | Admitting: *Deleted

## 2014-10-25 MED ORDER — LORAZEPAM 0.5 MG PO TABS: ORAL_TABLET | ORAL | Status: DC

## 2014-10-25 NOTE — Telephone Encounter (Signed)
[facility] 

## 2014-11-14 ENCOUNTER — Other Ambulatory Visit: Payer: Self-pay | Admitting: *Deleted

## 2014-11-14 MED ORDER — LORAZEPAM 0.5 MG PO TABS: ORAL_TABLET | ORAL | Status: AC

## 2014-11-14 NOTE — Telephone Encounter (Signed)
[facility] 

## 2015-04-05 ENCOUNTER — Emergency Department (HOSPITAL_COMMUNITY): Payer: Medicare Other

## 2015-04-05 ENCOUNTER — Encounter (HOSPITAL_COMMUNITY): Payer: Self-pay | Admitting: Emergency Medicine

## 2015-04-05 ENCOUNTER — Emergency Department (HOSPITAL_COMMUNITY)
Admission: EM | Admit: 2015-04-05 | Discharge: 2015-04-05 | Disposition: A | Discharge status: Home / Self Care | Payer: Medicare Other | Attending: Emergency Medicine | Admitting: Emergency Medicine

## 2015-04-05 DIAGNOSIS — Z8742 Personal history of other diseases of the female genital tract: Secondary | ICD-10-CM | POA: Diagnosis not present

## 2015-04-05 DIAGNOSIS — Z8673 Personal history of transient ischemic attack (TIA), and cerebral infarction without residual deficits: Secondary | ICD-10-CM | POA: Insufficient documentation

## 2015-04-05 DIAGNOSIS — Z91040 Latex allergy status: Secondary | ICD-10-CM | POA: Insufficient documentation

## 2015-04-05 DIAGNOSIS — Z87891 Personal history of nicotine dependence: Secondary | ICD-10-CM | POA: Diagnosis not present

## 2015-04-05 DIAGNOSIS — Y998 Other external cause status: Secondary | ICD-10-CM | POA: Diagnosis not present

## 2015-04-05 DIAGNOSIS — H9190 Unspecified hearing loss, unspecified ear: Secondary | ICD-10-CM | POA: Insufficient documentation

## 2015-04-05 DIAGNOSIS — F329 Major depressive disorder, single episode, unspecified: Secondary | ICD-10-CM | POA: Insufficient documentation

## 2015-04-05 DIAGNOSIS — I129 Hypertensive chronic kidney disease with stage 1 through stage 4 chronic kidney disease, or unspecified chronic kidney disease: Secondary | ICD-10-CM | POA: Diagnosis not present

## 2015-04-05 DIAGNOSIS — Z880 Allergy status to penicillin: Secondary | ICD-10-CM | POA: Insufficient documentation

## 2015-04-05 DIAGNOSIS — Z8739 Personal history of other diseases of the musculoskeletal system and connective tissue: Secondary | ICD-10-CM | POA: Diagnosis not present

## 2015-04-05 DIAGNOSIS — S79911A Unspecified injury of right hip, initial encounter: Secondary | ICD-10-CM | POA: Diagnosis present

## 2015-04-05 DIAGNOSIS — S50312A Abrasion of left elbow, initial encounter: Secondary | ICD-10-CM | POA: Diagnosis not present

## 2015-04-05 DIAGNOSIS — K219 Gastro-esophageal reflux disease without esophagitis: Secondary | ICD-10-CM | POA: Diagnosis not present

## 2015-04-05 DIAGNOSIS — Z856 Personal history of leukemia: Secondary | ICD-10-CM | POA: Diagnosis not present

## 2015-04-05 DIAGNOSIS — N183 Chronic kidney disease, stage 3 (moderate): Secondary | ICD-10-CM | POA: Diagnosis not present

## 2015-04-05 DIAGNOSIS — W1839XA Other fall on same level, initial encounter: Secondary | ICD-10-CM | POA: Diagnosis not present

## 2015-04-05 DIAGNOSIS — Y9289 Other specified places as the place of occurrence of the external cause: Secondary | ICD-10-CM | POA: Insufficient documentation

## 2015-04-05 DIAGNOSIS — F419 Anxiety disorder, unspecified: Secondary | ICD-10-CM | POA: Diagnosis not present

## 2015-04-05 DIAGNOSIS — G8929 Other chronic pain: Secondary | ICD-10-CM | POA: Insufficient documentation

## 2015-04-05 DIAGNOSIS — F0390 Unspecified dementia without behavioral disturbance: Secondary | ICD-10-CM | POA: Insufficient documentation

## 2015-04-05 DIAGNOSIS — S59901A Unspecified injury of right elbow, initial encounter: Secondary | ICD-10-CM | POA: Insufficient documentation

## 2015-04-05 DIAGNOSIS — W19XXXA Unspecified fall, initial encounter: Secondary | ICD-10-CM

## 2015-04-05 DIAGNOSIS — Z862 Personal history of diseases of the blood and blood-forming organs and certain disorders involving the immune mechanism: Secondary | ICD-10-CM | POA: Insufficient documentation

## 2015-04-05 DIAGNOSIS — Z86718 Personal history of other venous thrombosis and embolism: Secondary | ICD-10-CM | POA: Insufficient documentation

## 2015-04-05 DIAGNOSIS — R52 Pain, unspecified: Secondary | ICD-10-CM

## 2015-04-05 DIAGNOSIS — Y9389 Activity, other specified: Secondary | ICD-10-CM | POA: Diagnosis not present

## 2015-04-05 LAB — BASIC METABOLIC PANEL
ANION GAP: 11 (ref 5–15)
BUN: 6 mg/dL (ref 6–20)
CALCIUM: 9.2 mg/dL (ref 8.9–10.3)
CHLORIDE: 99 mmol/L — AB (ref 101–111)
CO2: 23 mmol/L (ref 22–32)
Creatinine, Ser: 0.85 mg/dL (ref 0.44–1.00)
GFR calc non Af Amer: >60 mL/min (ref >60)
Glucose, Bld: 111 mg/dL — ABNORMAL HIGH (ref 65–99)
POTASSIUM: 4.4 mmol/L (ref 3.5–5.1)
Sodium: 133 mmol/L — ABNORMAL LOW (ref 135–145)

## 2015-04-05 LAB — CBC WITH DIFFERENTIAL/PLATELET
BASOS ABS: 0 10*3/uL (ref 0.0–0.1)
BASOS PCT: 0 %
Eosinophils Absolute: 0.1 10*3/uL (ref 0.0–0.7)
Eosinophils Relative: 1 %
HEMATOCRIT: 34.5 % — AB (ref 36.0–46.0)
HEMOGLOBIN: 12 g/dL (ref 12.0–15.0)
LYMPHS PCT: 9 %
Lymphs Abs: 0.8 10*3/uL (ref 0.7–4.0)
MCH: 32.2 pg (ref 26.0–34.0)
MCHC: 34.8 g/dL (ref 30.0–36.0)
MCV: 92.5 fL (ref 78.0–100.0)
Monocytes Absolute: 0.6 10*3/uL (ref 0.1–1.0)
Monocytes Relative: 7 %
NEUTROS ABS: 7.3 10*3/uL (ref 1.7–7.7)
NEUTROS PCT: 83 %
Platelets: 349 10*3/uL (ref 150–400)
RBC: 3.73 MIL/uL — AB (ref 3.87–5.11)
RDW: 12.8 % (ref 11.5–15.5)
WBC: 8.8 10*3/uL (ref 4.0–10.5)

## 2015-04-05 LAB — URINALYSIS, ROUTINE W REFLEX MICROSCOPIC
Bilirubin Urine: NEGATIVE
Glucose, UA: NEGATIVE mg/dL
Hgb urine dipstick: NEGATIVE
KETONES UR: NEGATIVE mg/dL
NITRITE: NEGATIVE
PH: 7.5 (ref 5.0–8.0)
Protein, ur: NEGATIVE mg/dL
Specific Gravity, Urine: 1.009 (ref 1.005–1.030)

## 2015-04-05 LAB — URINE MICROSCOPIC-ADD ON

## 2015-04-05 LAB — POC OCCULT BLOOD, ED: Fecal Occult Bld: NEGATIVE

## 2015-04-05 MED ORDER — FENTANYL CITRATE (PF) 100 MCG/2ML IJ SOLN
50.0000 ug | Freq: Once | INTRAMUSCULAR | Status: AC
  Administered 2015-04-05: 50 ug via INTRAVENOUS

## 2015-04-05 MED ORDER — OXYCODONE-ACETAMINOPHEN 5-325 MG PO TABS
ORAL_TABLET | ORAL | Status: DC
Start: 2015-04-05 — End: 2015-04-05
  Filled 2015-04-05: qty 1

## 2015-04-05 MED ORDER — FENTANYL CITRATE (PF) 100 MCG/2ML IJ SOLN
INTRAMUSCULAR | Status: AC
  Filled 2015-04-05: qty 2

## 2015-04-05 MED ORDER — OXYCODONE-ACETAMINOPHEN 5-325 MG PO TABS
1.0000 | ORAL_TABLET | Freq: Once | ORAL | Status: AC
  Administered 2015-04-05: 1 via ORAL

## 2015-04-05 NOTE — Discharge Instructions (Signed)
Fall Prevention in the Home  [name], your CT scan and x-rays do not show any injuries from your fall. See a primary care physician within 3 days for close follow-up. If any symptoms worsen, come back to the emergency department immediately. Thank you. Falls can cause injuries. They can happen to people of all ages. There are many things you can do to make your home safe and to help prevent falls.  WHAT CAN I DO ON THE OUTSIDE OF MY HOME?  Regularly fix the edges of walkways and driveways and fix any cracks.  Remove anything that might make you trip as you walk through a door, such as a raised step or threshold.  Trim any bushes or trees on the path to your home.  Use bright outdoor lighting.  Clear any walking paths of anything that might make someone trip, such as rocks or tools.  Regularly check to see if handrails are loose or broken. Make sure that both sides of any steps have handrails.  Any raised decks and porches should have guardrails on the edges.  Have any leaves, snow, or ice cleared regularly.  Use sand or salt on walking paths during winter.  Clean up any spills in your garage right away. This includes oil or grease spills. WHAT CAN I DO IN THE BATHROOM?   Use night lights.  Install grab bars by the toilet and in the tub and shower. Do not use towel bars as grab bars.  Use non-skid mats or decals in the tub or shower.  If you need to sit down in the shower, use a plastic, non-slip stool.  Keep the floor dry. Clean up any water that spills on the floor as soon as it happens.  Remove soap buildup in the tub or shower regularly.  Attach bath mats securely with double-sided non-slip rug tape.  Do not have throw rugs and other things on the floor that can make you trip. WHAT CAN I DO IN THE BEDROOM?  Use night lights.  Make sure that you have a light by your bed that is easy to reach.  Do not use any sheets or blankets that are too big for your bed. They  should not hang down onto the floor.  Have a firm chair that has side arms. You can use this for support while you get dressed.  Do not have throw rugs and other things on the floor that can make you trip. WHAT CAN I DO IN THE KITCHEN?  Clean up any spills right away.  Avoid walking on wet floors.  Keep items that you use a lot in easy-to-reach places.  If you need to reach something above you, use a strong step stool that has a grab bar.  Keep electrical cords out of the way.  Do not use floor polish or wax that makes floors slippery. If you must use wax, use non-skid floor wax.  Do not have throw rugs and other things on the floor that can make you trip. WHAT CAN I DO WITH MY STAIRS?  Do not leave any items on the stairs.  Make sure that there are handrails on both sides of the stairs and use them. Fix handrails that are broken or loose. Make sure that handrails are as long as the stairways.  Check any carpeting to make sure that it is firmly attached to the stairs. Fix any carpet that is loose or worn.  Avoid having throw rugs at the top or  bottom of the stairs. If you do have throw rugs, attach them to the floor with carpet tape.  Make sure that you have a light switch at the top of the stairs and the bottom of the stairs. If you do not have them, ask someone to add them for you. WHAT ELSE CAN I DO TO HELP PREVENT FALLS?  Wear shoes that:  Do not have high heels.  Have rubber bottoms.  Are comfortable and fit you well.  Are closed at the toe. Do not wear sandals.  If you use a stepladder:  Make sure that it is fully opened. Do not climb a closed stepladder.  Make sure that both sides of the stepladder are locked into place.  Ask someone to hold it for you, if possible.  Clearly mark and make sure that you can see:  Any grab bars or handrails.  First and last steps.  Where the edge of each step is.  Use tools that help you move around (mobility aids) if  they are needed. These include:  Canes.  Walkers.  Scooters.  Crutches.  Turn on the lights when you go into a dark area. Replace any light bulbs as soon as they burn out.  Set up your furniture so you have a clear path. Avoid moving your furniture around.  If any of your floors are uneven, fix them.  If there are any pets around you, be aware of where they are.  Review your medicines with your doctor. Some medicines can make you feel dizzy. This can increase your chance of falling. Ask your doctor what other things that you can do to help prevent falls.   This information is not intended to replace advice given to you by your health care provider. Make sure you discuss any questions you have with your health care provider.   Document Released: 10/20/2008 Document Revised: 05/10/2014 Document Reviewed: 01/28/2014 Elsevier Interactive Patient Education [employer].

## 2015-04-05 NOTE — ED Notes (Signed)
Pt arrives via EMS from [facility] facility for unwitnessed fall, pt c/o R hip pain. Pt unable to tolerate repositioning to assess shortening or rotation. Pt is alert x4, denies LOC and head injury. No palpable pulses to R foot, however pulses can be dopplered on medial side.

## 2015-04-05 NOTE — ED Notes (Signed)
Notified family of plan of care. Plan for transport via [facility] back to facility

## 2015-04-05 NOTE — ED Notes (Signed)
Patient left at this time with all belongings. 

## 2015-04-05 NOTE — ED Provider Notes (Signed)
CSN: [id number]     Arrival date & time 04/05/15  0401 History   First MD Initiated Contact with Patient 04/05/15 0405     Chief Complaint  Patient presents with  . Fall     (Consider location/radiation/quality/duration/timing/severity/associated sxs/prior Treatment) HPI   [name] is a 82 y.o. female with past medical history of dementia presenting today after a fall. Fall was unwitnessed. Patient cannot give further history. She states she has pain in her right hip and right elbow. There are no further complaints.   Past Medical History  Diagnosis Date  . Hypertension   . Hyperlipemia   . Cerebral hemorrhage, nontraumatic ([place]) 2011  . Ureterocele, acquired   . Broken arm     left X 2; right X 1; 1 OR" (07/01/2014)  . Brain swelling ([place]) 2016  . History of pubovaginal sling   . Depression   . Memory loss   . Confusion   . Falls frequently   . Hearing loss   . Hyperlipemia   . DVT (deep venous thrombosis) ([place])     "she has one behind her left knee" (07/01/2014)  . GERD (gastroesophageal reflux disease)   . History of hiatal hernia   . Stroke [facility]) 2015 & 2016    Dec; Jan, blockage cerebellum; "balance issues; cognition issues since" (07/01/2014)  . CVA (cerebral vascular accident) ([place]) 2011    left cerebellum  . Osteoporosis   . Collapsed vertebra ([place])     "one in her lower and upper back" (07/01/2014)  . Chronic back pain     "w/activity" (07/01/2014)  . Anxiety   . Chronic kidney disease (CKD), stage III (moderate)   . Dementia     "since 2011 CVA; due to micro hemorrhages"   . CLL (chronic lymphocytic leukemia) ([place])    Past Surgical History  Procedure Laterality Date  . Cholecystectomy    . Fracture surgery    . Bladder suspension  2006  . Forearm fracture surgery Left 1980's  . Abdominal hysterectomy      "partial"   Family History  Problem Relation Age of Onset  . Stroke Mother   . Diabetes Mother   . Hyperlipidemia Mother   . Hypertension  Mother   . Stroke Father   . Heart disease Father   . Hyperlipidemia Father   . Depression Sister   . Cancer Brother 74    lung   Social History  Substance Use Topics  . Smoking status: Former Smoker -- 1.00 packs/day for 40 years    Types: Cigarettes  . Smokeless tobacco: Never Used     Comment: "stopped smoking in the 1990's"  . Alcohol Use: No   OB History    No data available     Review of Systems  Unable to perform ROS: Dementia      Allergies  Penicillins; Adhesive; and Latex  Home Medications   Prior to Admission medications   Medication Sig Start Date End Date Taking? Authorizing Provider  acetaminophen (TYLENOL) 500 MG tablet Take 1,000 mg by mouth every 6 (six) hours as needed for mild pain.    Historical Provider, MD  bisacodyl (DULCOLAX) 10 MG suppository Place 1 suppository (10 mg total) rectally daily as needed for mild constipation. 07/13/14   Thurnell Lose, MD  clopidogrel (PLAVIX) 75 MG tablet Take 1 tablet (75 mg total) by mouth daily. 02/23/14   Lavon Paganini Angiulli, PA-C  fentaNYL (DURAGESIC - DOSED MCG/HR) 25 MCG/HR patch Place 1  patch (25 mcg total) onto the skin every 3 (three) days. Remove old patch. Rotate sites 09/21/14   Estill Dooms, MD  FLUoxetine (PROZAC) 20 MG tablet Take 1 tablet (20 mg total) by mouth daily. 05/02/14   Debbrah Alar, NP  lisinopril (PRINIVIL,ZESTRIL) 10 MG tablet Take 1 tablet (10 mg total) by mouth 2 (two) times daily. 06/27/14   Debbrah Alar, NP  LORazepam (ATIVAN) 0.5 MG tablet Take one tablet by mouth every 12 hours scheduled; Take one tablet by mouth every 4 hours as needed for anxiety/agitation 11/14/14   Tiffany L Reed, DO  LORazepam (ATIVAN) 2 MG/ML concentrated solution Take 0.5 mLs (1 mg total) by mouth every 6 (six) hours as needed for anxiety. 07/13/14   Thurnell Lose, MD  metoprolol tartrate (LOPRESSOR) 25 MG tablet Take 12.5 mg by mouth daily.    Historical Provider, MD  Morphine Sulfate (MORPHINE  CONCENTRATE) 10 MG/0.5ML SOLN concentrated solution Take 0.5 mLs (10 mg total) by mouth every 3 (three) hours as needed for moderate pain or severe pain. 07/13/14   Thurnell Lose, MD  ondansetron (ZOFRAN) 4 MG tablet Take 1 tablet (4 mg total) by mouth every 6 (six) hours as needed for nausea. 07/13/14   Thurnell Lose, MD  ranitidine (ZANTAC) 150 MG tablet Take 1 tablet (150 mg total) by mouth at bedtime. 04/15/14   Brunetta Jeans, PA-C   BP 171/91 mmHg  Pulse 97  Resp 18  SpO2 98% Physical Exam  Constitutional: She appears well-developed and well-nourished. No distress.  HENT:  Head: Normocephalic and atraumatic.  Nose: Nose normal.  Mouth/Throat: Oropharynx is clear and moist. No oropharyngeal exudate.  Eyes: Conjunctivae and EOM are normal. Pupils are equal, round, and reactive to light. No scleral icterus.  Neck: Normal range of motion. Neck supple. No JVD present. No tracheal deviation present. No thyromegaly present.  Cardiovascular: Normal rate, regular rhythm and normal heart sounds.  Exam reveals no gallop and no friction rub.   No murmur heard. Pulmonary/Chest: Effort normal and breath sounds normal. No respiratory distress. She has no wheezes. She exhibits no tenderness.  Abdominal: Soft. Bowel sounds are normal. She exhibits no distension and no mass. There is no tenderness. There is no rebound and no guarding.  Musculoskeletal: Normal range of motion. She exhibits no edema or tenderness.  TTP of R hip, limited ROM secondary to pain  Lymphadenopathy:    She has no cervical adenopathy.  Neurological: She is alert. No cranial nerve deficit. She exhibits normal muscle tone.  Skin: Skin is warm and dry. No rash noted. No erythema. No pallor.  Abrasion to L elbow, no active bleeding, mild TTP  Nursing note and vitals reviewed.   ED Course  Procedures (including critical care time) Labs Review Labs Reviewed  CBC WITH DIFFERENTIAL/PLATELET - Abnormal; Notable for the  following:    RBC 3.73 (*)    HCT 34.5 (*)    All other components within normal limits  BASIC METABOLIC PANEL - Abnormal; Notable for the following:    Sodium 133 (*)    Chloride 99 (*)    Glucose, Bld 111 (*)    All other components within normal limits  URINALYSIS, ROUTINE W REFLEX MICROSCOPIC (NOT AT [facility]) - Abnormal; Notable for the following:    APPearance CLOUDY (*)    Leukocytes, UA TRACE (*)    All other components within normal limits  URINE MICROSCOPIC-ADD ON - Abnormal; Notable for the following:  Squamous Epithelial / LPF 0-5 (*)    Bacteria, UA RARE (*)    All other components within normal limits    Imaging Review Dg Chest 1 View  04/05/2015  CLINICAL DATA:  Status post unwitnessed fall. Concern for chest injury. Initial encounter. EXAM: CHEST 1 VIEW COMPARISON:  Chest radiograph performed 07/10/2014 FINDINGS: The lungs are well-aerated. Peribronchial thickening is noted. Mild right basilar atelectasis is noted. There is no evidence of pleural effusion or pneumothorax. The cardiomediastinal silhouette is borderline normal in size. No acute osseous abnormalities are seen. IMPRESSION: Peribronchial thickening noted. Mild right basilar atelectasis noted. No displaced rib fracture seen. Electronically Signed   By: Garald Balding M.D.   On: 04/05/2015 05:09   Dg Elbow Complete Right  04/05/2015  CLINICAL DATA:  Status post fall, with right elbow pain. Abrasion at the lateral right elbow. Initial encounter. EXAM: RIGHT ELBOW - COMPLETE 3+ VIEW COMPARISON:  None. FINDINGS: There is no evidence of fracture or dislocation. The visualized joint spaces are preserved. No significant joint effusion is identified. The soft tissues are unremarkable in appearance. IMPRESSION: No evidence of fracture or dislocation. Electronically Signed   By: Garald Balding M.D.   On: 04/05/2015 05:08   Ct Head Wo Contrast  04/05/2015  CLINICAL DATA:  Status post unwitnessed fall. Concern for head injury.  Initial encounter. EXAM: CT HEAD WITHOUT CONTRAST TECHNIQUE: Contiguous axial images were obtained from the base of the skull through the vertex without intravenous contrast. COMPARISON:  CT of the head performed 06/28/2014, and MRI of the brain performed 07/11/2014 FINDINGS: There is no evidence of acute infarction, mass lesion, or intra- or extra-axial hemorrhage on CT. Prominence of the ventricles and sulci reflects mild to moderate cortical volume loss. A chronic infarct is noted at the left cerebellar hemisphere, with associated encephalomalacia. Scattered periventricular and subcortical white matter change likely reflects small vessel ischemic microangiopathy. The brainstem and fourth ventricle are within normal limits. The basal ganglia are unremarkable in appearance. The cerebral hemispheres demonstrate grossly normal gray-white differentiation. No mass effect or midline shift is seen. There is no evidence of fracture; visualized osseous structures are unremarkable in appearance. The orbits are within normal limits. The paranasal sinuses and mastoid air cells are well-aerated. No significant soft tissue abnormalities are seen. IMPRESSION: 1. No evidence of traumatic intracranial injury or fracture. 2. Mild to moderate cortical volume loss and scattered small vessel ischemic microangiopathy. 3. Chronic infarct at the left cerebellar hemisphere, with associated encephalomalacia. Electronically Signed   By: Garald Balding M.D.   On: 04/05/2015 04:43   Dg Hip Unilat With Pelvis 2-3 Views Right  04/05/2015  CLINICAL DATA:  Status post unwitnessed fall, with right hip pain. Initial encounter. EXAM: DG HIP (WITH OR WITHOUT PELVIS) 2-3V RIGHT COMPARISON:  Right hip radiographs from 07/08/2014 FINDINGS: There is no evidence of fracture or dislocation. Both femoral heads are seated normally within their respective acetabula. The proximal right femur appears intact. No significant degenerative change is appreciated.  The sacroiliac joints are unremarkable in appearance. The visualized bowel gas pattern is grossly unremarkable in appearance. IMPRESSION: No evidence of fracture or dislocation. Electronically Signed   By: Garald Balding M.D.   On: 04/05/2015 05:07   I have personally reviewed and evaluated these images and lab results as part of my medical decision-making.   EKG Interpretation   Date/Time:  Wednesday April 05 2015 05:19:40 EDT Ventricular Rate:  98 PR Interval:  154 QRS Duration: 99 QT Interval:  369 QTC Calculation: 471 R Axis:   -56 Text Interpretation:  Sinus rhythm Left axis deviation Baseline wander in  lead(s) II III aVF No significant change since last tracing Confirmed by  Glynn Octave [phone]) on 04/05/2015 5:50:52 AM      MDM   Final diagnoses:  Pain    Patient presents emergency department after an unwitnessed fall. CT scan of the head as well as x-rays are negative for any significant injury. Laboratory studies not reveal cause of her fall. She does have history of dementia and has multiple falls. Upon repeat evaluation, patient has no significant tenderness to the right hip. She continues to appear well in no acute distress, vital signs remain within her normal limits and she is safe for discharge.    Everlene Balls, MD 04/05/15 [phone]

## 2016-06-07 DEATH — deceased

## 2016-10-15 IMAGING — MR MR HEAD W/O CM
8 of 10 series · 38 of 48 positions shown · non-contrast
Comparison: 06/28/2014 CT.  04/07/2014 MR.

CLINICAL DATA: 81-year-old hypertensive female with history of
hyperlipidemia and dementia presenting with increasing confusion
over the past 2 weeks and possible stroke. Abnormal CT. Subsequent
encounter.

EXAM:
MRI HEAD WITHOUT CONTRAST
TECHNIQUE: Multiplanar, multiecho pulse sequences of the brain and surrounding
structures were obtained without intravenous contrast.

[Series 3: FLAIR · sagittal · 5.0mm · 0.47mm/px · 2 of 24 slices shown (1 of 2)]
[im 1/24]
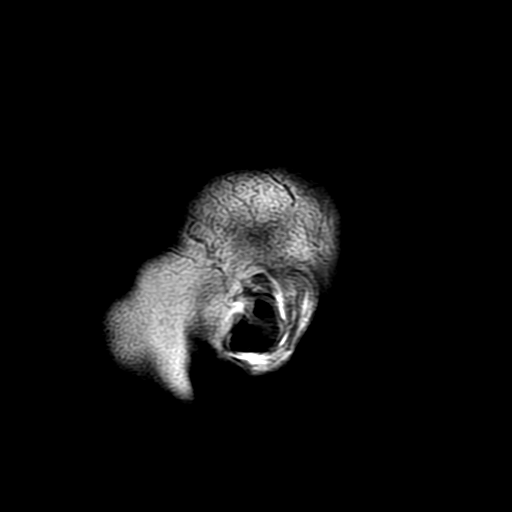
[im 24/24]
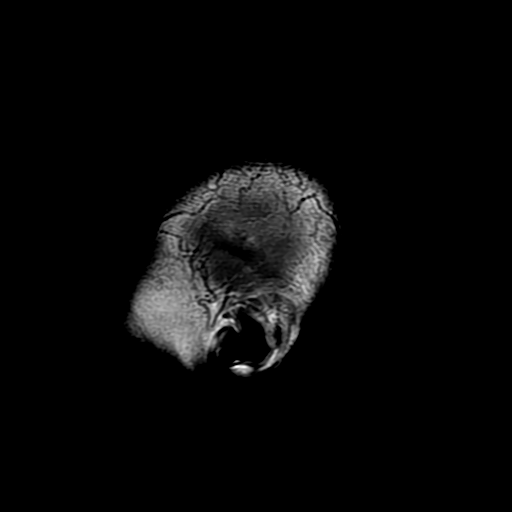

[Series 4: T2-star · axial · 5.0mm · 0.43mm/px · 1 of 26 slices shown]
[im 1/26]
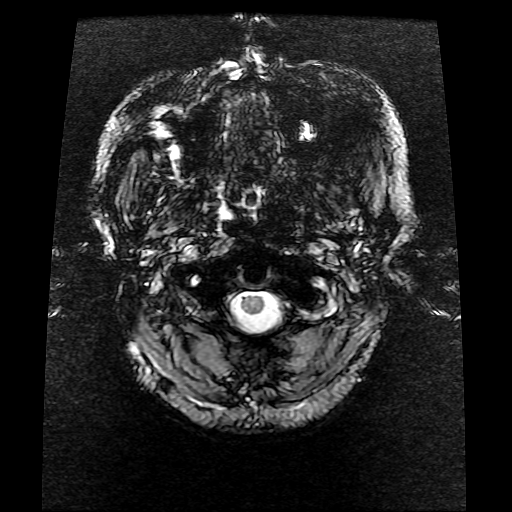

[Series 6: FLAIR · axial · 5.0mm · 0.43mm/px · z∈[-44,+103]mm · 3 of 26 slices shown (2 of 2)]
[im 1/26]
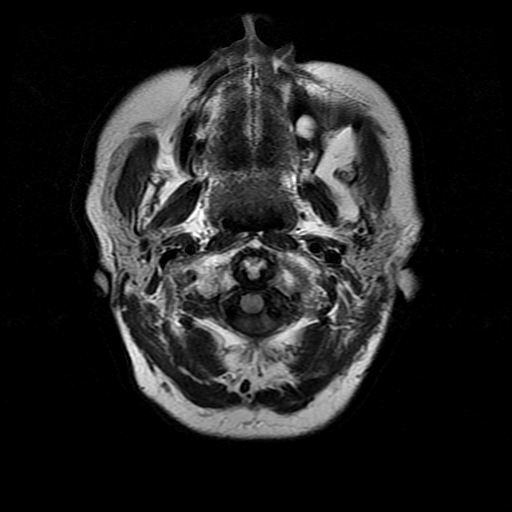
[im 13/26]
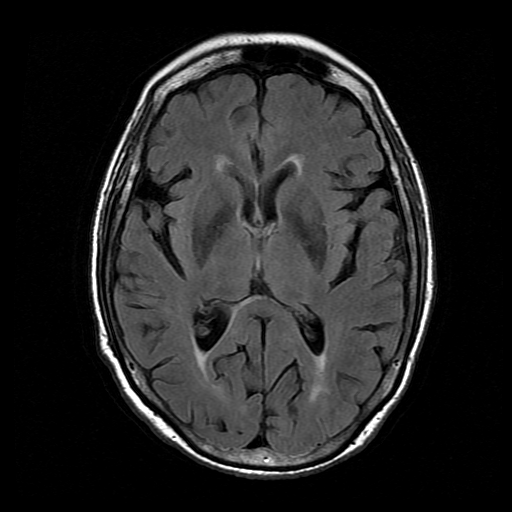
[im 26/26]
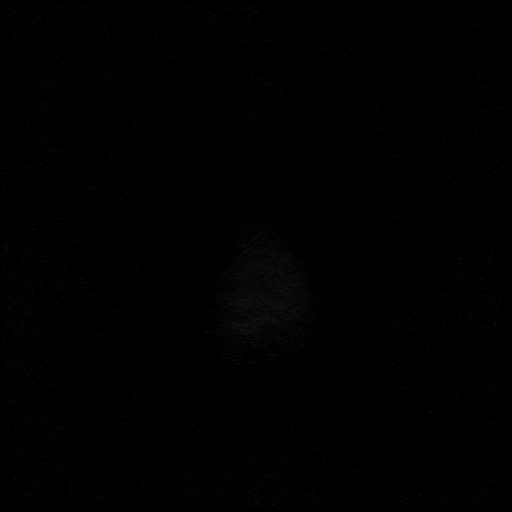

[Series 8: T2 · coronal · 5.0mm · 0.43mm/px · 3 of 29 slices shown]
[im 1/29]
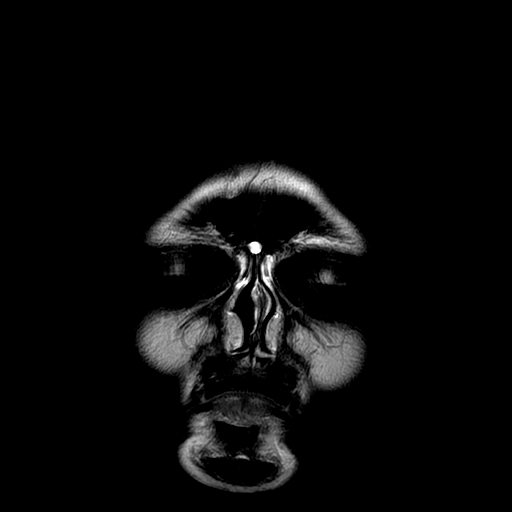
[im 15/29]
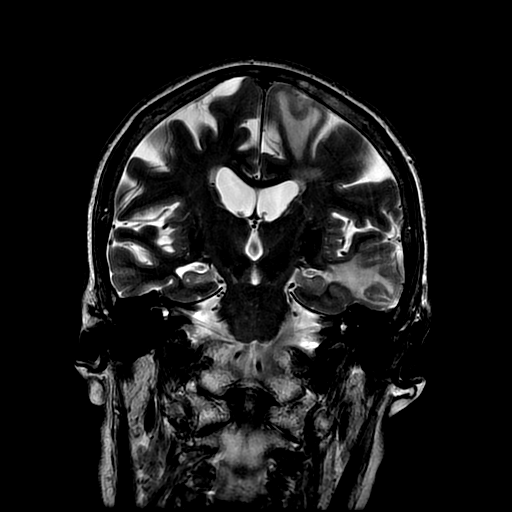
[im 29/29]
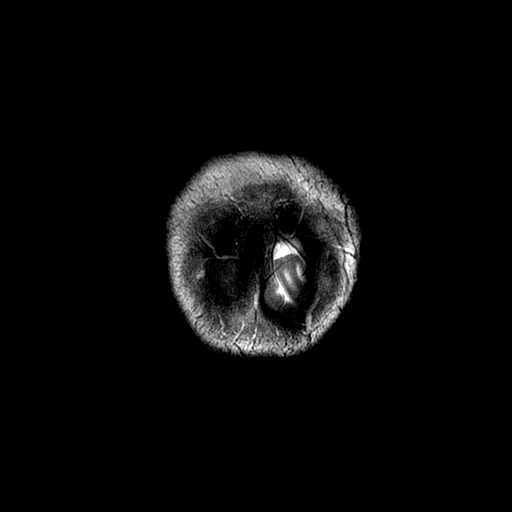

[Series 9: DWI · axial · 3.0mm · 1.09mm/px · z∈[-42,+106]mm · 11 of 102 slices shown (1 of 4)]
[im 1/102]
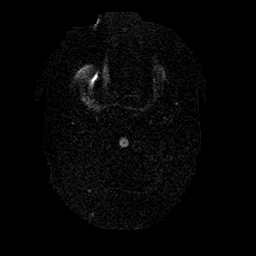
[im 11/102]
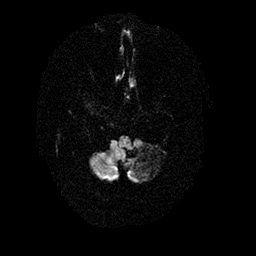
[im 21/102]
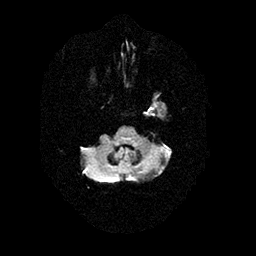
[im 31/102]
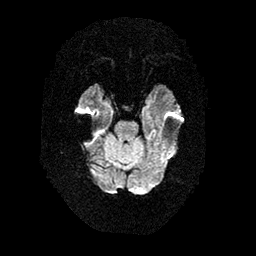
[im 41/102]
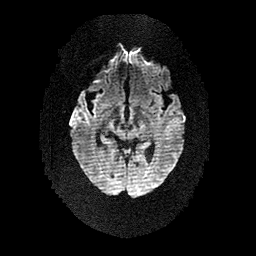
[im 51/102]
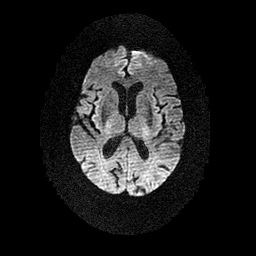
[im 61/102]
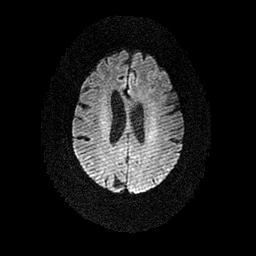
[im 71/102]
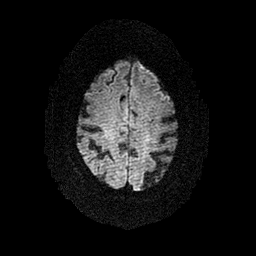
[im 81/102]
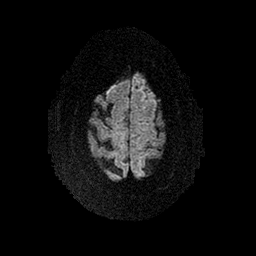
[im 91/102]
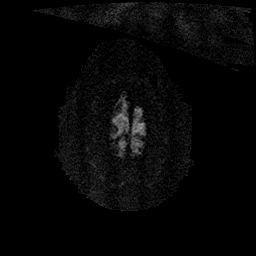
[im 102/102]
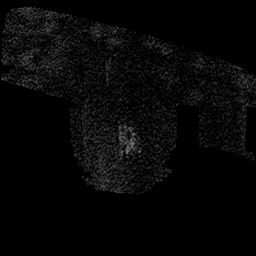

[Series 10: DWI · coronal · 5.0mm · 1.09mm/px · 8 of 70 slices shown (2 of 4)]
[im 1/70]
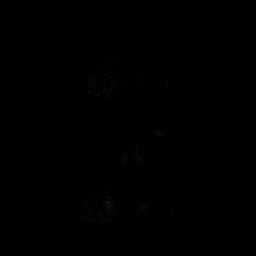
[im 10/70]
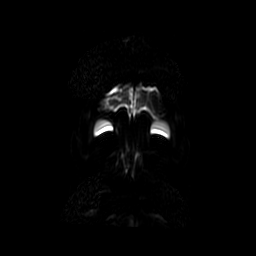
[im 20/70]
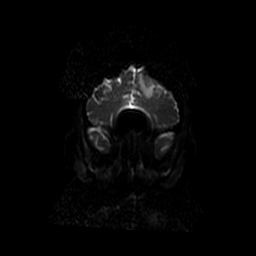
[im 30/70]
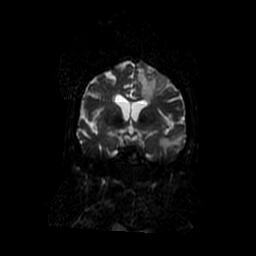
[im 40/70]
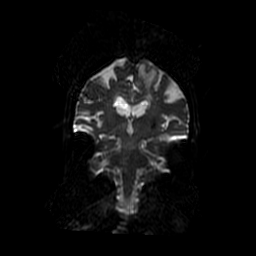
[im 50/70]
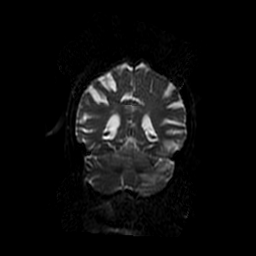
[im 60/70]
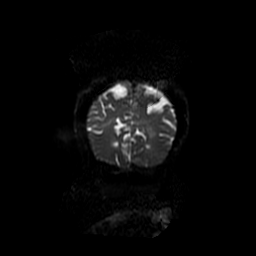
[im 70/70]
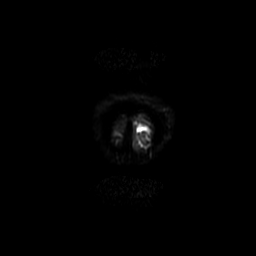

[Series 900: DWI · axial · 3.0mm · 1.09mm/px · z∈[-42,+106]mm · 6 of 51 slices shown (3 of 4)]
[im 1/51]
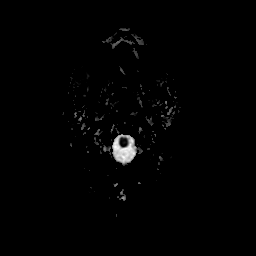
[im 11/51]
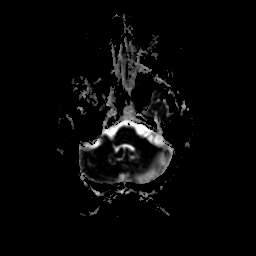
[im 21/51]
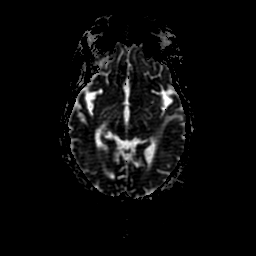
[im 31/51]
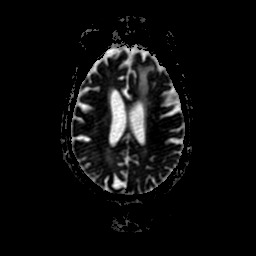
[im 41/51]
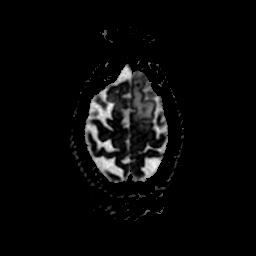
[im 51/51]
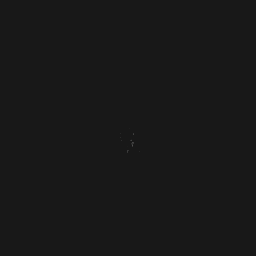

[Series 1000: DWI · coronal · 5.0mm · 1.09mm/px · 4 of 35 slices shown (4 of 4)]
[im 1/35]
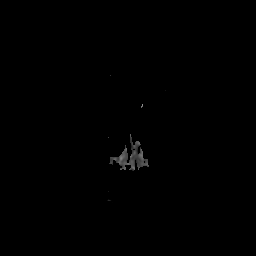
[im 12/35]
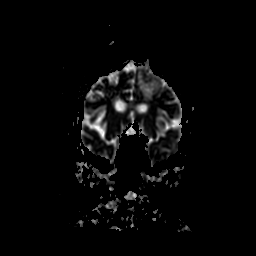
[im 23/35]
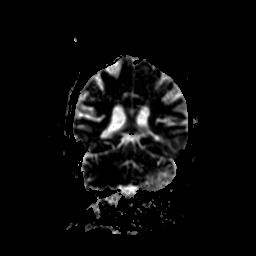
[im 35/35]
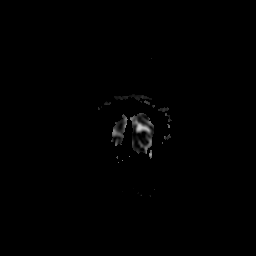

[38 of 48 positions shown; findings below may reference images not displayed]

FINDINGS: Representing a significant change from the relatively recent MR
(04/07/2014) is the presence of prominent vasogenic edema within the
anterior superior left frontal lobe and the anterior to mid left
temporal lobe. Along the periphery are scattered blood breakdown
products with focal 9 mm hematoma superior anterior left frontal
lobe. Etiology is indeterminate. Mild local mass effect with slight
inferior displacement of the left lateral ventricle.

This may reflect changes of amyloid angiography. Cortical vein
thrombosis could cause a similar appearance although the major dural
sinuses are patent. Metastatic disease is not entirely excluded.
Contrast was not administered. Infection does not appear to be the
case clinically and the MR findings are not indicative of such.

Patient would benefit from contrast-enhanced imaging to exclude
underlying lesion in addition to followup imaging to help
demonstrate that the vasogenic clears as would be expected with a
benign process.

No acute thrombotic infarct.

Remote partially hemorrhagic infarct mid to inferior left
cerebellum.

Mild small vessel disease type changes.

Global atrophy without hydrocephalus.

Major intracranial vascular structures are patent.
IMPRESSION: Representing a significant change from the relatively recent MR
(04/07/2014) is the presence of prominent vasogenic edema within the
anterior superior left frontal lobe and the anterior to mid left
temporal lobe. Along the periphery are scattered blood breakdown
products with focal 9 mm hematoma superior anterior left frontal
lobe. Etiology is indeterminate. Mild local mass effect with slight
inferior displacement of the left lateral ventricle.

This may reflect changes of amyloid angiography. Cortical vein
thrombosis could cause a similar appearance although the major dural
sinuses are patent. Metastatic disease is not entirely excluded.

Patient would benefit from contrast-enhanced imaging to exclude
underlying lesion in addition to followup imaging to help
demonstrate that the vasogenic clears as would be expected with a
benign process.

No acute thrombotic infarct.

Remote partially hemorrhagic infarct mid to inferior left
cerebellum.

These results were called by telephone at the time of interpretation
on 06/30/2014 at [DATE] to KAMENIC BOZANIC, Nurse Practitioner,
who verbally acknowledged these results.
